# Patient Record
Sex: Female | Born: 1935 | Race: White | Hispanic: No | Smoking: Former smoker
Health system: Southern US, Community
[De-identification: ages and names within clinical notes are randomized; demographics above are authoritative.]

## PROBLEM LIST (undated history)

## (undated) DIAGNOSIS — N83209 Unspecified ovarian cyst, unspecified side: Secondary | ICD-10-CM

## (undated) DIAGNOSIS — R011 Cardiac murmur, unspecified: Secondary | ICD-10-CM

## (undated) DIAGNOSIS — E785 Hyperlipidemia, unspecified: Secondary | ICD-10-CM

## (undated) DIAGNOSIS — H9190 Unspecified hearing loss, unspecified ear: Secondary | ICD-10-CM

## (undated) DIAGNOSIS — M4800 Spinal stenosis, site unspecified: Secondary | ICD-10-CM

## (undated) DIAGNOSIS — F32A Depression, unspecified: Secondary | ICD-10-CM

## (undated) DIAGNOSIS — B029 Zoster without complications: Secondary | ICD-10-CM

## (undated) DIAGNOSIS — I10 Essential (primary) hypertension: Secondary | ICD-10-CM

## (undated) DIAGNOSIS — Z96649 Presence of unspecified artificial hip joint: Secondary | ICD-10-CM

## (undated) DIAGNOSIS — B0229 Other postherpetic nervous system involvement: Secondary | ICD-10-CM

## (undated) DIAGNOSIS — F329 Major depressive disorder, single episode, unspecified: Secondary | ICD-10-CM

## (undated) DIAGNOSIS — T7840XA Allergy, unspecified, initial encounter: Secondary | ICD-10-CM

## (undated) DIAGNOSIS — M353 Polymyalgia rheumatica: Secondary | ICD-10-CM

## (undated) DIAGNOSIS — M1990 Unspecified osteoarthritis, unspecified site: Secondary | ICD-10-CM

## (undated) DIAGNOSIS — C4490 Unspecified malignant neoplasm of skin, unspecified: Secondary | ICD-10-CM

## (undated) DIAGNOSIS — K219 Gastro-esophageal reflux disease without esophagitis: Secondary | ICD-10-CM

## (undated) HISTORY — DX: Unspecified osteoarthritis, unspecified site: M19.90

## (undated) HISTORY — DX: Unspecified malignant neoplasm of skin, unspecified: C44.90

## (undated) HISTORY — DX: Depression, unspecified: F32.A

## (undated) HISTORY — DX: Other postherpetic nervous system involvement: B02.29

## (undated) HISTORY — DX: Essential (primary) hypertension: I10

## (undated) HISTORY — DX: Hyperlipidemia, unspecified: E78.5

## (undated) HISTORY — DX: Polymyalgia rheumatica: M35.3

## (undated) HISTORY — DX: Allergy, unspecified, initial encounter: T78.40XA

## (undated) HISTORY — PX: DILATION AND CURETTAGE OF UTERUS: SHX78

## (undated) HISTORY — DX: Presence of unspecified artificial hip joint: Z96.649

## (undated) HISTORY — PX: HERNIA REPAIR: SHX51

## (undated) HISTORY — DX: Major depressive disorder, single episode, unspecified: F32.9

## (undated) HISTORY — DX: Unspecified ovarian cyst, unspecified side: N83.209

## (undated) HISTORY — DX: Gastro-esophageal reflux disease without esophagitis: K21.9

## (undated) HISTORY — DX: Spinal stenosis, site unspecified: M48.00

## (undated) HISTORY — PX: TIBIA FRACTURE SURGERY: SHX806

## (undated) HISTORY — PX: OOPHORECTOMY: SHX86

## (undated) HISTORY — PX: TONSILLECTOMY AND ADENOIDECTOMY: SUR1326

## (undated) HISTORY — DX: Zoster without complications: B02.9

## (undated) HISTORY — PX: HYSTEROSCOPY: SHX211

---

## 1998-05-04 ENCOUNTER — Ambulatory Visit (HOSPITAL_COMMUNITY): Admission: RE | Admit: 1998-05-04 | Discharge: 1998-05-04 | Payer: Self-pay | Admitting: *Deleted

## 1999-07-26 ENCOUNTER — Other Ambulatory Visit: Admission: RE | Admit: 1999-07-26 | Discharge: 1999-07-26 | Payer: Self-pay | Admitting: Obstetrics and Gynecology

## 1999-08-13 ENCOUNTER — Encounter: Payer: Self-pay | Admitting: Internal Medicine

## 1999-08-13 ENCOUNTER — Encounter: Admission: RE | Admit: 1999-08-13 | Discharge: 1999-08-13 | Payer: Self-pay | Admitting: Internal Medicine

## 2000-04-24 ENCOUNTER — Encounter (INDEPENDENT_AMBULATORY_CARE_PROVIDER_SITE_OTHER): Payer: Self-pay | Admitting: Gastroenterology

## 2000-09-02 ENCOUNTER — Other Ambulatory Visit: Admission: RE | Admit: 2000-09-02 | Discharge: 2000-09-02 | Payer: Self-pay | Admitting: Obstetrics and Gynecology

## 2001-09-20 ENCOUNTER — Other Ambulatory Visit: Admission: RE | Admit: 2001-09-20 | Discharge: 2001-09-20 | Payer: Self-pay | Admitting: Obstetrics and Gynecology

## 2003-01-31 ENCOUNTER — Encounter: Payer: Self-pay | Admitting: Internal Medicine

## 2003-03-03 ENCOUNTER — Other Ambulatory Visit: Admission: RE | Admit: 2003-03-03 | Discharge: 2003-03-03 | Payer: Self-pay | Admitting: Obstetrics and Gynecology

## 2003-03-11 ENCOUNTER — Encounter: Payer: Self-pay | Admitting: Emergency Medicine

## 2003-03-11 ENCOUNTER — Emergency Department (HOSPITAL_COMMUNITY): Admission: EM | Admit: 2003-03-11 | Discharge: 2003-03-11 | Payer: Self-pay | Admitting: Emergency Medicine

## 2003-03-22 ENCOUNTER — Encounter: Payer: Self-pay | Admitting: Internal Medicine

## 2004-06-17 ENCOUNTER — Ambulatory Visit: Payer: Self-pay | Admitting: Family Medicine

## 2004-09-13 ENCOUNTER — Ambulatory Visit: Payer: Self-pay | Admitting: Internal Medicine

## 2005-01-07 ENCOUNTER — Ambulatory Visit: Payer: Self-pay | Admitting: Internal Medicine

## 2005-02-05 ENCOUNTER — Ambulatory Visit: Payer: Self-pay | Admitting: Internal Medicine

## 2005-03-05 ENCOUNTER — Other Ambulatory Visit: Admission: RE | Admit: 2005-03-05 | Discharge: 2005-03-05 | Payer: Self-pay | Admitting: Obstetrics and Gynecology

## 2005-03-14 ENCOUNTER — Ambulatory Visit: Payer: Self-pay | Admitting: Internal Medicine

## 2005-03-18 ENCOUNTER — Ambulatory Visit: Payer: Self-pay | Admitting: Internal Medicine

## 2005-05-09 ENCOUNTER — Ambulatory Visit: Payer: Self-pay | Admitting: Internal Medicine

## 2005-06-06 ENCOUNTER — Ambulatory Visit: Payer: Self-pay | Admitting: Internal Medicine

## 2005-07-16 ENCOUNTER — Ambulatory Visit: Payer: Self-pay | Admitting: Internal Medicine

## 2005-07-29 ENCOUNTER — Ambulatory Visit: Payer: Self-pay | Admitting: Internal Medicine

## 2005-08-29 ENCOUNTER — Ambulatory Visit: Payer: Self-pay | Admitting: Internal Medicine

## 2005-10-13 ENCOUNTER — Ambulatory Visit: Payer: Self-pay | Admitting: Internal Medicine

## 2005-10-28 ENCOUNTER — Ambulatory Visit: Payer: Self-pay | Admitting: Internal Medicine

## 2005-12-10 ENCOUNTER — Ambulatory Visit: Payer: Self-pay | Admitting: Internal Medicine

## 2005-12-19 ENCOUNTER — Ambulatory Visit: Payer: Self-pay | Admitting: Gastroenterology

## 2006-01-05 ENCOUNTER — Ambulatory Visit: Payer: Self-pay | Admitting: Internal Medicine

## 2006-01-13 ENCOUNTER — Encounter: Payer: Self-pay | Admitting: Internal Medicine

## 2006-01-13 ENCOUNTER — Ambulatory Visit: Payer: Self-pay | Admitting: Gastroenterology

## 2006-01-13 LAB — HM COLONOSCOPY

## 2006-01-29 ENCOUNTER — Encounter: Payer: Self-pay | Admitting: Internal Medicine

## 2006-03-17 ENCOUNTER — Ambulatory Visit: Payer: Self-pay | Admitting: Internal Medicine

## 2006-03-17 ENCOUNTER — Other Ambulatory Visit: Admission: RE | Admit: 2006-03-17 | Discharge: 2006-03-17 | Payer: Self-pay | Admitting: Obstetrics and Gynecology

## 2006-05-06 ENCOUNTER — Ambulatory Visit: Payer: Self-pay | Admitting: Internal Medicine

## 2006-06-18 ENCOUNTER — Ambulatory Visit: Payer: Self-pay | Admitting: Internal Medicine

## 2006-08-07 ENCOUNTER — Ambulatory Visit: Payer: Self-pay | Admitting: Internal Medicine

## 2006-08-07 LAB — CONVERTED CEMR LAB
ALT: 19 units/L (ref 0–40)
AST: 22 units/L (ref 0–37)
Albumin: 3.8 g/dL (ref 3.5–5.2)
Alkaline Phosphatase: 74 units/L (ref 39–117)
BUN: 22 mg/dL (ref 6–23)
Bilirubin, Direct: 0.1 mg/dL (ref 0.0–0.3)
CO2: 32 meq/L (ref 19–32)
Calcium: 9.3 mg/dL (ref 8.4–10.5)
Chloride: 102 meq/L (ref 96–112)
Cholesterol: 160 mg/dL (ref 0–200)
Creatinine, Ser: 0.8 mg/dL (ref 0.4–1.2)
GFR calc Af Amer: 89 mL/min
GFR calc non Af Amer: 73 mL/min
Glucose, Bld: 91 mg/dL (ref 70–99)
HDL: 45 mg/dL (ref >39.0)
LDL Cholesterol: 91 mg/dL (ref 0–99)
Potassium: 4.8 meq/L (ref 3.5–5.1)
Sodium: 138 meq/L (ref 135–145)
Total Bilirubin: 0.8 mg/dL (ref 0.3–1.2)
Total CHOL/HDL Ratio: 3.6
Total Protein: 6.5 g/dL (ref 6.0–8.3)
Triglycerides: 118 mg/dL (ref 0–149)
VLDL: 24 mg/dL (ref 0–40)

## 2006-09-29 ENCOUNTER — Ambulatory Visit: Payer: Self-pay | Admitting: Internal Medicine

## 2006-10-01 ENCOUNTER — Ambulatory Visit: Payer: Self-pay | Admitting: Internal Medicine

## 2006-11-16 ENCOUNTER — Ambulatory Visit: Payer: Self-pay | Admitting: Internal Medicine

## 2006-11-16 LAB — CONVERTED CEMR LAB: CRP, High Sensitivity: 1 (ref 0.00–5.00)

## 2006-12-04 ENCOUNTER — Ambulatory Visit: Payer: Self-pay | Admitting: Internal Medicine

## 2007-01-19 DIAGNOSIS — J309 Allergic rhinitis, unspecified: Secondary | ICD-10-CM | POA: Insufficient documentation

## 2007-01-19 DIAGNOSIS — K222 Esophageal obstruction: Secondary | ICD-10-CM | POA: Insufficient documentation

## 2007-03-02 ENCOUNTER — Encounter: Payer: Self-pay | Admitting: Internal Medicine

## 2007-03-03 ENCOUNTER — Ambulatory Visit: Payer: Self-pay | Admitting: Internal Medicine

## 2007-03-04 LAB — CONVERTED CEMR LAB
ALT: 27 units/L (ref 0–35)
AST: 24 units/L (ref 0–37)
Albumin: 3.8 g/dL (ref 3.5–5.2)
Alkaline Phosphatase: 84 units/L (ref 39–117)
BUN: 21 mg/dL (ref 6–23)
Bilirubin, Direct: 0.1 mg/dL (ref 0.0–0.3)
CO2: 34 meq/L — ABNORMAL HIGH (ref 19–32)
Calcium: 9.7 mg/dL (ref 8.4–10.5)
Chloride: 99 meq/L (ref 96–112)
Cholesterol: 164 mg/dL (ref 0–200)
Creatinine, Ser: 0.7 mg/dL (ref 0.4–1.2)
GFR calc Af Amer: 103 mL/min
GFR calc non Af Amer: 85 mL/min
Glucose, Bld: 103 mg/dL — ABNORMAL HIGH (ref 70–99)
HDL: 52.1 mg/dL (ref >39.0)
LDL Cholesterol: 94 mg/dL (ref 0–99)
Potassium: 3.7 meq/L (ref 3.5–5.1)
Sodium: 140 meq/L (ref 135–145)
Total Bilirubin: 0.7 mg/dL (ref 0.3–1.2)
Total CHOL/HDL Ratio: 3.1
Total Protein: 6.9 g/dL (ref 6.0–8.3)
Triglycerides: 92 mg/dL (ref 0–149)
VLDL: 18 mg/dL (ref 0–40)

## 2007-06-22 ENCOUNTER — Telehealth: Payer: Self-pay | Admitting: Internal Medicine

## 2007-06-25 ENCOUNTER — Ambulatory Visit: Payer: Self-pay | Admitting: Internal Medicine

## 2007-07-16 ENCOUNTER — Telehealth: Payer: Self-pay | Admitting: Internal Medicine

## 2007-07-19 ENCOUNTER — Telehealth: Payer: Self-pay | Admitting: Internal Medicine

## 2007-07-26 ENCOUNTER — Telehealth: Payer: Self-pay | Admitting: Internal Medicine

## 2007-09-30 ENCOUNTER — Ambulatory Visit: Payer: Self-pay | Admitting: Internal Medicine

## 2007-10-04 ENCOUNTER — Telehealth: Payer: Self-pay | Admitting: Internal Medicine

## 2007-10-04 LAB — CONVERTED CEMR LAB
ALT: 30 units/L (ref 0–35)
AST: 34 units/L (ref 0–37)
Alkaline Phosphatase: 98 units/L (ref 39–117)
BUN: 22 mg/dL (ref 6–23)
CO2: 32 meq/L (ref 19–32)
Calcium: 9.5 mg/dL (ref 8.4–10.5)
Chloride: 101 meq/L (ref 96–112)
Cholesterol: 148 mg/dL (ref 0–200)
GFR calc Af Amer: 103 mL/min
Total CHOL/HDL Ratio: 3.1
Total Protein: 6.5 g/dL (ref 6.0–8.3)

## 2007-10-12 ENCOUNTER — Telehealth: Payer: Self-pay | Admitting: Internal Medicine

## 2007-10-13 ENCOUNTER — Telehealth: Payer: Self-pay | Admitting: Internal Medicine

## 2007-10-22 ENCOUNTER — Other Ambulatory Visit: Admission: RE | Admit: 2007-10-22 | Discharge: 2007-10-22 | Payer: Self-pay | Admitting: Obstetrics and Gynecology

## 2007-12-30 ENCOUNTER — Ambulatory Visit: Payer: Self-pay | Admitting: Internal Medicine

## 2008-02-16 ENCOUNTER — Telehealth: Payer: Self-pay | Admitting: Internal Medicine

## 2008-02-23 ENCOUNTER — Ambulatory Visit: Payer: Self-pay | Admitting: Internal Medicine

## 2008-02-24 LAB — CONVERTED CEMR LAB
Calcium: 9.3 mg/dL (ref 8.4–10.5)
Chloride: 99 meq/L (ref 96–112)
Creatinine, Ser: 0.8 mg/dL (ref 0.4–1.2)
GFR calc non Af Amer: 73 mL/min
HDL: 46.7 mg/dL (ref >39.0)
LDL Cholesterol: 71 mg/dL (ref 0–99)
Sodium: 139 meq/L (ref 135–145)
Total Bilirubin: 0.6 mg/dL (ref 0.3–1.2)
Total CHOL/HDL Ratio: 3
Triglycerides: 103 mg/dL (ref 0–149)

## 2008-03-03 LAB — CONVERTED CEMR LAB: Vit D, 1,25-Dihydroxy: 33 (ref 30–89)

## 2008-05-10 ENCOUNTER — Ambulatory Visit: Payer: Self-pay | Admitting: Internal Medicine

## 2008-06-23 ENCOUNTER — Ambulatory Visit: Payer: Self-pay | Admitting: Internal Medicine

## 2008-06-26 LAB — CONVERTED CEMR LAB
AST: 20 units/L (ref 0–37)
Basophils Absolute: 0 10*3/uL (ref 0.0–0.1)
Basophils Relative: 0.7 % (ref 0.0–3.0)
Chloride: 101 meq/L (ref 96–112)
Cholesterol: 154 mg/dL (ref 0–200)
Creatinine, Ser: 0.7 mg/dL (ref 0.4–1.2)
Eosinophils Absolute: 0.3 10*3/uL (ref 0.0–0.7)
GFR calc non Af Amer: 85 mL/min
HDL: 44.8 mg/dL (ref >39.0)
MCHC: 34.1 g/dL (ref 30.0–36.0)
MCV: 94.9 fL (ref 78.0–100.0)
Neutrophils Relative %: 62.4 % (ref 43.0–77.0)
Platelets: 215 10*3/uL (ref 150–400)
Potassium: 3.4 meq/L — ABNORMAL LOW (ref 3.5–5.1)
RDW: 11.8 % (ref 11.5–14.6)
Sodium: 141 meq/L (ref 135–145)
TSH: 1.71 microintl units/mL (ref 0.35–5.50)
Total Bilirubin: 0.7 mg/dL (ref 0.3–1.2)
Triglycerides: 98 mg/dL (ref 0–149)
VLDL: 20 mg/dL (ref 0–40)

## 2008-06-28 ENCOUNTER — Telehealth: Payer: Self-pay | Admitting: Internal Medicine

## 2008-06-30 ENCOUNTER — Telehealth: Payer: Self-pay | Admitting: Internal Medicine

## 2008-08-17 ENCOUNTER — Encounter: Payer: Self-pay | Admitting: Internal Medicine

## 2008-08-24 ENCOUNTER — Telehealth: Payer: Self-pay | Admitting: Internal Medicine

## 2008-08-28 ENCOUNTER — Telehealth: Payer: Self-pay | Admitting: Internal Medicine

## 2008-09-28 ENCOUNTER — Ambulatory Visit: Payer: Self-pay | Admitting: Internal Medicine

## 2008-09-29 ENCOUNTER — Telehealth: Payer: Self-pay | Admitting: Internal Medicine

## 2008-10-04 ENCOUNTER — Ambulatory Visit: Payer: Self-pay | Admitting: Internal Medicine

## 2008-10-04 ENCOUNTER — Encounter: Admission: RE | Admit: 2008-10-04 | Discharge: 2008-11-01 | Payer: Self-pay | Admitting: Internal Medicine

## 2008-11-01 ENCOUNTER — Encounter: Payer: Self-pay | Admitting: Internal Medicine

## 2008-11-15 ENCOUNTER — Telehealth (INDEPENDENT_AMBULATORY_CARE_PROVIDER_SITE_OTHER): Payer: Self-pay | Admitting: *Deleted

## 2008-11-15 ENCOUNTER — Ambulatory Visit: Payer: Self-pay | Admitting: Family Medicine

## 2008-11-21 ENCOUNTER — Telehealth: Payer: Self-pay | Admitting: Internal Medicine

## 2008-12-09 ENCOUNTER — Ambulatory Visit: Payer: Self-pay | Admitting: Family Medicine

## 2008-12-10 ENCOUNTER — Encounter: Admission: RE | Admit: 2008-12-10 | Discharge: 2008-12-10 | Payer: Self-pay | Admitting: Orthopedic Surgery

## 2009-01-05 ENCOUNTER — Ambulatory Visit: Payer: Self-pay | Admitting: Internal Medicine

## 2009-01-09 LAB — CONVERTED CEMR LAB
ALT: 20 units/L (ref 0–35)
Albumin: 4.1 g/dL (ref 3.5–5.2)
Cholesterol: 188 mg/dL (ref 0–200)
GFR calc non Af Amer: 63.42 mL/min (ref >60)
Glucose, Bld: 101 mg/dL — ABNORMAL HIGH (ref 70–99)
HDL: 60.2 mg/dL (ref >39.00)
Potassium: 3.9 meq/L (ref 3.5–5.1)
Sodium: 136 meq/L (ref 135–145)
TSH: 1.5 microintl units/mL (ref 0.35–5.50)
Total Bilirubin: 1.4 mg/dL — ABNORMAL HIGH (ref 0.3–1.2)
Total Protein: 7.5 g/dL (ref 6.0–8.3)
VLDL: 23.2 mg/dL (ref 0.0–40.0)

## 2009-01-10 ENCOUNTER — Ambulatory Visit: Payer: Self-pay | Admitting: Internal Medicine

## 2009-01-10 ENCOUNTER — Encounter: Payer: Self-pay | Admitting: Internal Medicine

## 2009-01-31 ENCOUNTER — Ambulatory Visit: Payer: Self-pay | Admitting: Obstetrics and Gynecology

## 2009-02-07 ENCOUNTER — Telehealth: Payer: Self-pay | Admitting: Internal Medicine

## 2009-02-12 ENCOUNTER — Ambulatory Visit: Payer: Self-pay | Admitting: Internal Medicine

## 2009-02-12 DIAGNOSIS — K5730 Diverticulosis of large intestine without perforation or abscess without bleeding: Secondary | ICD-10-CM | POA: Insufficient documentation

## 2009-02-12 LAB — CONVERTED CEMR LAB
ALT: 21 units/L (ref 0–35)
Albumin: 3.4 g/dL — ABNORMAL LOW (ref 3.5–5.2)
Alkaline Phosphatase: 92 units/L (ref 39–117)
CO2: 32 meq/L (ref 19–32)
Eosinophils Relative: 6 % — ABNORMAL HIGH (ref 0.0–5.0)
GFR calc non Af Amer: 84.74 mL/min (ref >60)
Glucose, Bld: 106 mg/dL — ABNORMAL HIGH (ref 70–99)
Lymphocytes Relative: 16.8 % (ref 12.0–46.0)
Monocytes Relative: 9.9 % (ref 3.0–12.0)
Neutrophils Relative %: 67.2 % (ref 43.0–77.0)
Platelets: 354 10*3/uL (ref 150.0–400.0)
Potassium: 3.9 meq/L (ref 3.5–5.1)
Sodium: 135 meq/L (ref 135–145)
Total Protein: 7.6 g/dL (ref 6.0–8.3)
WBC: 9.6 10*3/uL (ref 4.5–10.5)

## 2009-02-13 ENCOUNTER — Telehealth: Payer: Self-pay | Admitting: Internal Medicine

## 2009-02-14 ENCOUNTER — Ambulatory Visit (HOSPITAL_COMMUNITY): Admission: RE | Admit: 2009-02-14 | Discharge: 2009-02-14 | Payer: Self-pay | Admitting: Internal Medicine

## 2009-02-16 ENCOUNTER — Ambulatory Visit: Payer: Self-pay | Admitting: Internal Medicine

## 2009-02-22 ENCOUNTER — Ambulatory Visit: Payer: Self-pay | Admitting: Internal Medicine

## 2009-02-22 ENCOUNTER — Encounter: Payer: Self-pay | Admitting: Internal Medicine

## 2009-02-23 ENCOUNTER — Encounter: Payer: Self-pay | Admitting: Internal Medicine

## 2009-02-28 ENCOUNTER — Telehealth: Payer: Self-pay | Admitting: Internal Medicine

## 2009-07-11 ENCOUNTER — Ambulatory Visit: Payer: Self-pay | Admitting: Internal Medicine

## 2009-07-12 LAB — CONVERTED CEMR LAB
ALT: 17 units/L (ref 0–35)
AST: 20 units/L (ref 0–37)
Alkaline Phosphatase: 84 units/L (ref 39–117)
Calcium: 9.3 mg/dL (ref 8.4–10.5)
Creatinine, Ser: 0.8 mg/dL (ref 0.4–1.2)
GFR calc non Af Amer: 72.56 mL/min (ref >60)
Glucose, Bld: 87 mg/dL (ref 70–99)
HDL: 51.7 mg/dL (ref >39.00)
Sodium: 138 meq/L (ref 135–145)
Total Bilirubin: 0.9 mg/dL (ref 0.3–1.2)

## 2009-08-20 ENCOUNTER — Encounter: Payer: Self-pay | Admitting: Internal Medicine

## 2009-11-08 ENCOUNTER — Telehealth: Payer: Self-pay | Admitting: Internal Medicine

## 2009-11-12 ENCOUNTER — Telehealth: Payer: Self-pay | Admitting: Internal Medicine

## 2009-11-14 ENCOUNTER — Ambulatory Visit: Payer: Self-pay | Admitting: Internal Medicine

## 2010-01-11 ENCOUNTER — Telehealth: Payer: Self-pay | Admitting: Internal Medicine

## 2010-02-13 ENCOUNTER — Ambulatory Visit: Payer: Self-pay | Admitting: Obstetrics and Gynecology

## 2010-02-13 ENCOUNTER — Telehealth: Payer: Self-pay | Admitting: Internal Medicine

## 2010-02-13 ENCOUNTER — Other Ambulatory Visit: Admission: RE | Admit: 2010-02-13 | Discharge: 2010-02-13 | Payer: Self-pay | Admitting: Obstetrics and Gynecology

## 2010-03-13 ENCOUNTER — Ambulatory Visit: Payer: Self-pay | Admitting: Internal Medicine

## 2010-03-14 LAB — CONVERTED CEMR LAB
Albumin: 3.8 g/dL (ref 3.5–5.2)
Alkaline Phosphatase: 102 units/L (ref 39–117)
BUN: 21 mg/dL (ref 6–23)
CO2: 33 meq/L — ABNORMAL HIGH (ref 19–32)
Calcium: 9.1 mg/dL (ref 8.4–10.5)
Cholesterol: 145 mg/dL (ref 0–200)
Creatinine, Ser: 0.7 mg/dL (ref 0.4–1.2)
GFR calc non Af Amer: 92.06 mL/min (ref >60)
Glucose, Bld: 73 mg/dL (ref 70–99)
HDL: 53.2 mg/dL (ref >39.00)
Sodium: 139 meq/L (ref 135–145)
Total Protein: 6.7 g/dL (ref 6.0–8.3)
Triglycerides: 85 mg/dL (ref 0.0–149.0)
VLDL: 17 mg/dL (ref 0.0–40.0)

## 2010-08-22 NOTE — Progress Notes (Signed)
Summary: REQ FOR SAMPLES [facility])  Phone Note Call from Patient   Caller: Patient  [phone] Reason for Call: Talk to Nurse Summary of Call: Pt called to adv that she would like samples of med:  Benicar ... Pt adv that Rx has been sent in but she is currently out of medication and may not receive the medication for at least a week.Marland KitchenMarland KitchenTherefore, pt is req samples of medication to do her till her meds arrive.... Pt adv that she can be reached at [phone] when / if  samples are ready for p/u.  Pt can be reached at [phone] when same is ready for p/u.  Initial call taken by: Debbra Riding,  November 08, 2009 11:55 AM  Follow-up for Phone Call        samples ready.Patient notified.  Follow-up by: Gladis Riffle, RN,  November 08, 2009 12:15 PM

## 2010-08-22 NOTE — Assessment & Plan Note (Signed)
Summary: 4 month fup//ccm   Vital Signs:  Patient profile:   77 year old female Pulse rate:   60 / minute Pulse rhythm:   regular Resp:     12 per minute BP sitting:   122 / 70  (left arm) Cuff size:   regular  Vitals Entered By: Gladis Riffle, RN (March 13, 2010 10:54 AM) CC: 4 month rov Is Patient Diabetic? No   Primary Care Provider:  [name], MD  CC:  4 month rov.  History of Present Illness:  Follow-Up Visit      This is an [name] who presents for Follow-up visit.  The patient denies chest pain and palpitations.  Since the last visit the patient notes no new problems or concerns.  The patient reports taking meds as prescribed.  When questioned about possible medication side effects, the patient notes none.    All other systems reviewed and were negative   Preventive Screening-Counseling & Management  Alcohol-Tobacco     Smoking Status: quit  Current Problems (verified): 1)  Diverticulosis-colon  (ICD-562.10) 2)  Gerd  (ICD-530.81) 3)  Spinal Stenosis, Lumbar  (ICD-724.02) 4)  Hyperlipidemia  (ICD-272.4) 5)  Esophageal Stricture  (ICD-530.3) 6)  Osteoporosis  (ICD-733.00) 7)  Hypertension  (ICD-401.9) 8)  Depression  (ICD-311) 9)  Allergic Rhinitis  (ICD-477.9)  Current Medications (verified): 1)  Aspir-81 81 Mg Tbec (Aspirin) .... Take 1 Tablet By Mouth Once A Day 2)  Benicar Hct 20-12.5 Mg Tabs (Olmesartan Medoxomil-Hctz) .Marland Kitchen.. 1 Daily By Mouth 3)  Cartia Xt 180 Mg Cp24 (Diltiazem Hcl Coated Beads) .... Take 1 Tablet By Mouth Once A Day 4)  Alendronate Sodium 70 Mg  Tabs (Alendronate Sodium) .... Weekly 5)  Flax Seed Oil 1000 Mg Caps (Flaxseed (Linseed)) .Marland Kitchen.. 1 Capsule By Mouth Once Daily 6)  Simvastatin 20 Mg Tabs (Simvastatin) .... Take 1 Tablet By Mouth At Bedtime 7)  Caltrate 600+d 600-400 Mg-Unit  Tabs (Calcium Carbonate-Vitamin D) .Marland Kitchen.. 1 Tablet By Mouth Two Times A Day 8)  Vitamin D 1000 Unit  Caps (Cholecalciferol) .... Once Daily 9)   Tylenol Extra Strength 500 Mg Tabs (Acetaminophen) .... 2 At Bedtime 10)  Omeprazole 20 Mg Cpdr (Omeprazole) .... Take 1 Tablet By Mouth Once A Day 11)  Multivitamins  Tabs (Multiple Vitamin) .Marland Kitchen.. 1 Tablet By Mouth Once Daily  Allergies: 1)  ! Sulfa 2)  ! Codeine 3)  ! * Mercury  Past History:  Past Medical History: Last updated: 02/12/2009 Allergic rhinitis Depression GERD Hypertension Osteoporosis Arthritis Hyperlipidemia Ovarian Cysts Spinal Stenosis DIVERTICULOSIS  Past Surgical History: Last updated: 02/12/2009 Oophorectomy-bilat Hernia repair Colonoscopy-01/13/2006  Family History: Last updated: 11/14/2009 Family History of Esophogeal CA father Family History of Bartholin Gland Carcinoma dtr---deceased---obese---likely cardiac (DM) Family History of Stomach Cancer: Aunt No FH of Colon Cancer: sister deceased stroke 28 yo  Social History: Last updated: 02/12/2009 Former Smoker Alcohol use-yes Retired Married Illicit Drug Use - no Daily Caffeine Use  Risk Factors: Smoking Status: quit (03/13/2010)  Physical Exam  General:  alert and well-developed.   Head:  normocephalic and atraumatic.   Eyes:  pupils equal and pupils round.   Skin:  turgor normal and color normal.   Psych:  normally interactive and good eye contact.     Impression & Recommendations:  Problem # 1:  GERD (ICD-530.81)  no sxs continue current medications  Her updated medication list for this problem includes:    Omeprazole 20 Mg Cpdr (Omeprazole) .Marland KitchenMarland KitchenMarland KitchenMarland Kitchen  Take 1 tablet by mouth once a day  EGD: Location: [facility]   (02/22/2009)  Labs Reviewed: Hgb: 12.8 (02/12/2009)   Hct: 37.7 (02/12/2009)  Problem # 2:  HYPERLIPIDEMIA (ICD-272.4)  controlled continue current medications  Her updated medication list for this problem includes:    Simvastatin 20 Mg Tabs (Simvastatin) .Marland Kitchen... Take 1 tablet by mouth at bedtime  Labs Reviewed: SGOT: 20 (07/11/2009)   SGPT:  17 (07/11/2009)   HDL:51.70 (07/11/2009), 60.20 (01/05/2009)  LDL:74 (07/11/2009), 105 (16/04/9603)  Chol:143 (07/11/2009), 188 (01/05/2009)  Trig:88.0 (07/11/2009), 116.0 (01/05/2009)  Orders: Specimen Handling (54098) TLB-Hepatic/Liver Function Pnl (80076-HEPATIC) TLB-Lipid Panel (80061-LIPID)  Problem # 3:  HYPERTENSION (ICD-401.9)  controlled continue current medications  Her updated medication list for this problem includes:    Benicar Hct 20-12.5 Mg Tabs (Olmesartan medoxomil-hctz) .Marland Kitchen... 1 daily by mouth    Cartia Xt 180 Mg Cp24 (Diltiazem hcl coated beads) .Marland Kitchen... Take 1 tablet by mouth once a day  BP today: 122/70 Prior BP: 138/78 (11/14/2009)  Labs Reviewed: K+: 4.3 (07/11/2009) Creat: : 0.8 (07/11/2009)   Chol: 143 (07/11/2009)   HDL: 51.70 (07/11/2009)   LDL: 74 (07/11/2009)   TG: 88.0 (07/11/2009)  Orders: Venipuncture (11914) TLB-BMP (Basic Metabolic Panel-BMET) (80048-METABOL)  Problem # 4:  DEPRESSION (ICD-311)  she is doing well on meds  Complete Medication List: 1)  Aspir-81 81 Mg Tbec (Aspirin) .... Take 1 tablet by mouth once a day 2)  Benicar Hct 20-12.5 Mg Tabs (Olmesartan medoxomil-hctz) .Marland Kitchen.. 1 daily by mouth 3)  Cartia Xt 180 Mg Cp24 (Diltiazem hcl coated beads) .... Take 1 tablet by mouth once a day 4)  Alendronate Sodium 70 Mg Tabs (Alendronate sodium) .... Weekly 5)  Flax Seed Oil 1000 Mg Caps (Flaxseed (linseed)) .Marland Kitchen.. 1 capsule by mouth once daily 6)  Simvastatin 20 Mg Tabs (Simvastatin) .... Take 1 tablet by mouth at bedtime 7)  Caltrate 600+d 600-400 Mg-unit Tabs (Calcium carbonate-vitamin d) .Marland Kitchen.. 1 tablet by mouth two times a day 8)  Vitamin D 1000 Unit Caps (Cholecalciferol) .... Once daily 9)  Tylenol Extra Strength 500 Mg Tabs (Acetaminophen) .... 2 at bedtime 10)  Omeprazole 20 Mg Cpdr (Omeprazole) .... Take 1 tablet by mouth once a day 11)  Multivitamins Tabs (Multiple vitamin) .Marland Kitchen.. 1 tablet by mouth once daily Prescriptions: OMEPRAZOLE 20  MG CPDR (OMEPRAZOLE) Take 1 tablet by mouth once a day  #90 x 3   Entered by:   Gladis Riffle, RN   Authorized by:   [name] MD   Signed by:   Gladis Riffle, RN on 03/13/2010   Method used:   Electronically to        [facility] [phone]* (retail)       [address]       [place], [place]  [address]       Ph: [phone] or [phone]       Fax: [phone]   RxID:   [id number]

## 2010-08-22 NOTE — Progress Notes (Signed)
Summary: Pt req script for Simvastatin 20mg  to [facility] Note Call from Patient Call back at [facility] Phone [phone]   Caller: Patient Summary of Call: Pt is req Simvastatin 20mg  once daily, as per previous discussion with Dr. Cato Mulligan. Pt was on 40mg  and was having to cut pills in half. [facility] to [phone]  Initial call taken by: Lucy Antigua,  January 11, 2010 2:37 PM  Follow-up for Phone Call        See Rx. Patient notified.  Follow-up by: Gladis Riffle, RN,  January 11, 2010 4:03 PM    New/Updated Medications: SIMVASTATIN 20 MG TABS (SIMVASTATIN) Take 1 tablet by mouth at bedtime Prescriptions: SIMVASTATIN 20 MG TABS (SIMVASTATIN) Take 1 tablet by mouth at bedtime  #30 x 5   Entered by:   Gladis Riffle, RN   Authorized by:   [name] MD   Signed by:   Gladis Riffle, RN on 01/11/2010   Method used:   Electronically to        [facility] [phone]* (retail)       [address]       [place], [place]  [address]       Ph: [phone] or [phone]       Fax: [phone]   RxID:   [id number]

## 2010-08-22 NOTE — Assessment & Plan Note (Signed)
Summary: FU BP/et   Vital Signs:  Patient profile:   77 year old female Weight:      152 pounds Temp:     97.8 degrees F oral Pulse rate:   64 / minute Pulse rhythm:   regular Resp:     12 per minute BP sitting:   138 / 78  (left arm) Cuff size:   regular  Vitals Entered By: Gladis Riffle, RN (November 14, 2009 10:52 AM)  Serial Vital Signs/Assessments:  Time      Position  BP       Pulse  Resp  Temp     By                     132/74                         [name] MD  CC: FU BP--taking benicar 40-25 1/2 once a day, wants to discuss Is Patient Diabetic? No   Primary Care Provider:  [name], MD  CC:  FU BP--taking benicar 40-25 1/2 once a day and wants to discuss.  History of Present Illness:  Follow-Up Visit      This is an 77 year old woman who presents for Follow-up visit.  The patient denies chest pain and palpitations.  Since the last visit the patient notes no new problems or concerns.  The patient reports taking meds as prescribed.  When questioned about possible medication side effects, the patient notes none.    All other systems reviewed and were negative   Preventive Screening-Counseling & Management  Alcohol-Tobacco     Smoking Status: quit  Current Problems (verified): 1)  Diverticulosis-colon  (ICD-562.10) 2)  Gerd  (ICD-530.81) 3)  Spinal Stenosis, Lumbar  (ICD-724.02) 4)  Hyperlipidemia  (ICD-272.4) 5)  Esophageal Stricture  (ICD-530.3) 6)  Osteoporosis  (ICD-733.00) 7)  Hypertension  (ICD-401.9) 8)  Depression  (ICD-311) 9)  Allergic Rhinitis  (ICD-477.9)  Current Medications (verified): 1)  Aspir-81 81 Mg Tbec (Aspirin) .... Take 1 Tablet By Mouth Once A Day 2)  Benicar Hct 40-25 Mg Tabs (Olmesartan Medoxomil-Hctz) .... Take 1/2  Tablet By Mouth Once A Day 3)  Cartia Xt 180 Mg Cp24 (Diltiazem Hcl Coated Beads) .... Take 1 Tablet By Mouth Once A Day 4)  Alendronate Sodium 70 Mg  Tabs (Alendronate Sodium) .... Weekly 5)  Flax Seed Oil 1000  Mg Caps (Flaxseed (Linseed)) .Marland Kitchen.. 1 Capsule By Mouth Once Daily 6)  Simvastatin 40 Mg Tabs (Simvastatin) .... Take 1/2 Tablet By Mouth At Bedtime 7)  Caltrate 600+d 600-400 Mg-Unit  Tabs (Calcium Carbonate-Vitamin D) .Marland Kitchen.. 1 Tablet By Mouth Two Times A Day 8)  Vitamin D 1000 Unit  Caps (Cholecalciferol) .... Once Daily 9)  Tylenol Extra Strength 500 Mg Tabs (Acetaminophen) .... 2 At Bedtime 10)  Prilosec 20 Mg Cpdr (Omeprazole) .... Take 1 Capsule By Mouth Once Daily 11)  Multivitamins  Tabs (Multiple Vitamin) .Marland Kitchen.. 1 Tablet By Mouth Once Daily  Allergies: 1)  ! Sulfa 2)  ! Codeine 3)  ! * Mercury  Past History:  Past Medical History: Last updated: 02/12/2009 Allergic rhinitis Depression GERD Hypertension Osteoporosis Arthritis Hyperlipidemia Ovarian Cysts Spinal Stenosis DIVERTICULOSIS  Past Surgical History: Last updated: 02/12/2009 Oophorectomy-bilat Hernia repair Colonoscopy-01/13/2006  Family History: Last updated: 11/14/2009 Family History of Esophogeal CA father Family History of Bartholin Gland Carcinoma dtr---deceased---obese---likely cardiac (DM) Family History of Stomach Cancer: Aunt No FH of  Colon Cancer: sister deceased stroke 59 yo  Social History: Last updated: 02/12/2009 Former Smoker Alcohol use-yes Retired Married Illicit Drug Use - no Daily Caffeine Use  Risk Factors: Smoking Status: quit (11/14/2009)  Family History: Family History of Esophogeal CA father Family History of Bartholin Gland Carcinoma dtr---deceased---obese---likely cardiac (DM) Family History of Stomach Cancer: Aunt No FH of Colon Cancer: sister deceased stroke 35 yo  Review of Systems       All other systems reviewed and were negative   Physical Exam  General:  alert and well-developed.   Head:  normocephalic and atraumatic.   Eyes:  pupils equal and pupils round.   Ears:  R ear normal and L ear normal.   Neck:  No deformities, masses, or tenderness  noted. Chest Wall:  No deformities, masses, or tenderness noted. Lungs:  normal respiratory effort and no intercostal retractions.   Heart:  normal rate and regular rhythm.   Abdomen:  soft and non-tender.   Msk:  No deformity or scoliosis noted of thoracic or lumbar spine.   Extremities:  No clubbing, cyanosis, edema, or deformity noted  Neurologic:  cranial nerves II-XII intact and gait normal.   Skin:  turgor normal and color normal.   Cervical Nodes:  no anterior cervical adenopathy and no posterior cervical adenopathy.   Psych:  normally interactive and good eye contact.     Impression & Recommendations:  Problem # 1:  HYPERLIPIDEMIA (ICD-272.4) controlled continue current medications  Her updated medication list for this problem includes:    Simvastatin 40 Mg Tabs (Simvastatin) .Marland Kitchen... Take 1/2 tablet by mouth at bedtime  Labs Reviewed: SGOT: 20 (07/11/2009)   SGPT: 17 (07/11/2009)   HDL:51.70 (07/11/2009), 60.20 (01/05/2009)  LDL:74 (07/11/2009), 105 (16/04/9603)  Chol:143 (07/11/2009), 188 (01/05/2009)  Trig:88.0 (07/11/2009), 116.0 (01/05/2009)  Problem # 2:  GERD (ICD-530.81) controlled, no sxs continue current medications  Her updated medication list for this problem includes:    Prilosec 20 Mg Cpdr (Omeprazole) .Marland Kitchen... Take 1 capsule by mouth once daily  Problem # 3:  HYPERTENSION (ICD-401.9) reasonable control continue current medications  Her updated medication list for this problem includes:    Benicar Hct 20-12.5 Mg Tabs (Olmesartan medoxomil-hctz) .Marland Kitchen... 1 daily by mouth    Cartia Xt 180 Mg Cp24 (Diltiazem hcl coated beads) .Marland Kitchen... Take 1 tablet by mouth once a day  BP today: 138/78 Prior BP: 114/76 (07/11/2009)  Labs Reviewed: K+: 4.3 (07/11/2009) Creat: : 0.8 (07/11/2009)   Chol: 143 (07/11/2009)   HDL: 51.70 (07/11/2009)   LDL: 74 (07/11/2009)   TG: 88.0 (07/11/2009)  Problem # 4:  OSTEOPOROSIS (ICD-733.00) no side effects on meds continue current  medications  Her updated medication list for this problem includes:    Alendronate Sodium 70 Mg Tabs (Alendronate sodium) .Marland Kitchen... Weekly  Complete Medication List: 1)  Aspir-81 81 Mg Tbec (Aspirin) .... Take 1 tablet by mouth once a day 2)  Benicar Hct 20-12.5 Mg Tabs (Olmesartan medoxomil-hctz) .Marland Kitchen.. 1 daily by mouth 3)  Cartia Xt 180 Mg Cp24 (Diltiazem hcl coated beads) .... Take 1 tablet by mouth once a day 4)  Alendronate Sodium 70 Mg Tabs (Alendronate sodium) .... Weekly 5)  Flax Seed Oil 1000 Mg Caps (Flaxseed (linseed)) .Marland Kitchen.. 1 capsule by mouth once daily 6)  Simvastatin 40 Mg Tabs (Simvastatin) .... Take 1/2 tablet by mouth at bedtime 7)  Caltrate 600+d 600-400 Mg-unit Tabs (Calcium carbonate-vitamin d) .Marland Kitchen.. 1 tablet by mouth two times a day 8)  Vitamin D  1000 Unit Caps (Cholecalciferol) .... Once daily 9)  Tylenol Extra Strength 500 Mg Tabs (Acetaminophen) .... 2 at bedtime 10)  Prilosec 20 Mg Cpdr (Omeprazole) .... Take 1 capsule by mouth once daily 11)  Multivitamins Tabs (Multiple vitamin) .Marland Kitchen.. 1 tablet by mouth once daily  Patient Instructions: 1)  Please schedule a follow-up appointment in 4 months. Prescriptions: BENICAR HCT 20-12.5 MG TABS (OLMESARTAN MEDOXOMIL-HCTZ) 1 daily by mouth  #90 x 3   Entered and Authorized by:   [name] MD   Signed by:   [name] MD on 11/14/2009   Method used:   Faxed to ...       [facility]             , [place]         Ph:        Fax: [phone]   RxID:   [id number]

## 2010-08-22 NOTE — Progress Notes (Signed)
Summary: REQ FOR REFILL RX [facility])  Phone Note Call from Patient   Caller: Patient  [phone] Reason for Call: Refill Medication Summary of Call: Pt called to req that a Rx for med: Benicar 20mg  be sent into [facility]...Marland KitchenMarland KitchenPt adv that [facility] received 2 faxes for med refill, one was for Benicar 20mg  / the other was for Benicar 40mg ..... Pt adv that [facility] won't fill Rx till they know which one they are supposed to fill... Pt req that a Rx for the correct dosage be sent to [facility].  Pt can be reached at [phone] with any questions or concerns.  Initial call taken by: Debbra Riding,  November 12, 2009 11:16 AM  Follow-up for Phone Call        Rx done.Patient notified personal voice mail. Follow-up by: Gladis Riffle, RN,  November 12, 2009 1:20 PM    Prescriptions: BENICAR HCT 40-25 MG TABS (OLMESARTAN MEDOXOMIL-HCTZ) Take 1 tablet by mouth once a day  #90 x 3   Entered by:   Gladis Riffle, RN   Authorized by:   [name] MD   Signed by:   Gladis Riffle, RN on 11/12/2009   Method used:   Faxed to ...       [facility]             , [place]         Ph:        Fax: [phone]   RxID:   [id number]

## 2010-08-22 NOTE — Progress Notes (Signed)
Summary: ? re meds   Phone Note Call from Patient Call back at Home Phone [phone]   Caller: Patient Call For: Juanda Chance Reason for Call: Talk to Nurse Summary of Call: Patient has questions regarding Omeprazole medication  Initial call taken by: Tawni Levy,  February 13, 2010 2:52 PM  Follow-up for Phone Call        Dr Juanda Chance- Patient states that she saw Dr Oletha Blend this morning and he suggested that she call and speak to [place] to okay discontinuing omeprazole as she feels "much better" and has "no problems." Per 02/22/10 endoscopy report, patient had mild gastritis and an irregular z-line. Biopsies of antrum showed mild chronic gastritis. Biopsies of EG Junction showed inflammation. I have advised patient that she would most likely need to continue PPI's indefinately but told her that I would ask you as she would really like your advice.   Follow-up by: Lamona Curl CMA Duncan Dull),  February 13, 2010 3:28 PM  Additional Follow-up for Phone Call Additional follow up Details #1::        chart reviewed, specifically EGD 02/2009. Based on the findings, and  on the resolution of pt's symptoms, it is OK to stop Prilosec ( Omeprazole) on trial basis for 1 month, then reasses for daily need vs as needed dosage. Additional Follow-up by: Hart Carwin MD,  February 15, 2010 1:20 PM    Additional Follow-up for Phone Call Additional follow up Details #2::    I have left a message with female for the patient to call back. Dottie Nelson-Smith CMA Duncan Dull)  February 15, 2010 1:34 PM    Dr Regino Schultze recommendations have been given to the patient and she verbalizes understanding. Dottie Nelson-Smith CMA Duncan Dull)  February 15, 2010 2:08 PM

## 2010-09-11 ENCOUNTER — Ambulatory Visit: Payer: Self-pay | Admitting: Obstetrics and Gynecology

## 2010-11-01 ENCOUNTER — Telehealth: Payer: Self-pay | Admitting: Internal Medicine

## 2010-11-01 NOTE — Telephone Encounter (Signed)
Pt is wondering when needs to know when she needs to come in for next ov and if she needs labs done?

## 2010-11-04 NOTE — Telephone Encounter (Signed)
Pt called to find out when she needs to come in for ov with labs prior to ov? Pt is needing lab done to get synthroid refilled. Pls advise.

## 2010-11-04 NOTE — Telephone Encounter (Addendum)
Schedule fasting labs and 6 mth f/u appt one week after.

## 2010-11-04 NOTE — Telephone Encounter (Signed)
I called pt and sch her for fasting labs on 12/06/10 and fup ov for 12/13/10, as noted.

## 2010-12-02 ENCOUNTER — Encounter: Payer: Self-pay | Admitting: Internal Medicine

## 2010-12-03 NOTE — Assessment & Plan Note (Signed)
[facility]                                 ON-CALL NOTE   NAME:[name], [name]                           MRN:          [id number]  DATE:12/03/2006                            DOB:          [birth year]    TIME OF INTERACTION:  At 5:42 p.m.   PHONE NUMBER:  [phone]   OBJECTIVE:  The patient was having trouble breathing. Has a terrible  cough and cold. Took some Mucinex DM and this is actually the fourth  time today she has taken it. All of a sudden after talking on the phone  for a long time trying to resolve an [employer] bill, got all closed up at  once. Had no wheezing. Had been talking a long time and had difficulty  breathing for a few minutes. That has now basically resolved as she is  talking with me on the phone with no problems and no wheezing. She was  recently in [place] for a graduation. The place where she was was air  conditioned and was very cold. She lost her voice and is now congested.   ASSESSMENT:  Presumed spasm of either the laryngeal, pharyngeal or  bronchial muscle wall which is presumably resolving.   PLAN:  May take Zyrtec. She was interested in doing that. Suggest 10 mg  once a day. She may use plain guaifenesin. I would stop the DM and use  it as an acute product. Drink as many fluids as possible. Voice rest as  much as possible and call for an appointment if her symptoms do not  improve. Go to her ER if her symptoms worsen.   PRIMARY CARE PHYSICIAN:  Valetta Mole. Swords, MD, home office is Caffie Pinto, MD  Electronically Signed    RNS/MedQ  DD: 12/03/2006  DT: 12/03/2006  Job #: [id number]   cc:   Valetta Mole. Swords, MD

## 2010-12-06 ENCOUNTER — Other Ambulatory Visit (INDEPENDENT_AMBULATORY_CARE_PROVIDER_SITE_OTHER): Payer: Medicare Other | Admitting: Internal Medicine

## 2010-12-06 DIAGNOSIS — Z0000 Encounter for general adult medical examination without abnormal findings: Secondary | ICD-10-CM

## 2010-12-06 DIAGNOSIS — E785 Hyperlipidemia, unspecified: Secondary | ICD-10-CM

## 2010-12-06 DIAGNOSIS — I10 Essential (primary) hypertension: Secondary | ICD-10-CM

## 2010-12-06 LAB — CBC WITH DIFFERENTIAL/PLATELET
Basophils Relative: 0.7 % (ref 0.0–3.0)
Eosinophils Absolute: 0.4 10*3/uL (ref 0.0–0.7)
Eosinophils Relative: 5.9 % — ABNORMAL HIGH (ref 0.0–5.0)
HCT: 35.9 % — ABNORMAL LOW (ref 36.0–46.0)
Lymphs Abs: 1.8 10*3/uL (ref 0.7–4.0)
MCHC: 33.7 g/dL (ref 30.0–36.0)
MCV: 93.4 fl (ref 78.0–100.0)
Monocytes Absolute: 0.6 10*3/uL (ref 0.1–1.0)
Neutrophils Relative %: 59.9 % (ref 43.0–77.0)
Platelets: 225 10*3/uL (ref 150.0–400.0)
WBC: 7.2 10*3/uL (ref 4.5–10.5)

## 2010-12-06 LAB — LIPID PANEL
Cholesterol: 144 mg/dL (ref 0–200)
HDL: 47 mg/dL (ref >39.00)
Triglycerides: 99 mg/dL (ref 0.0–149.0)

## 2010-12-06 LAB — POCT URINALYSIS DIPSTICK
Bilirubin, UA: NEGATIVE
Glucose, UA: NEGATIVE
Ketones, UA: NEGATIVE
Spec Grav, UA: 1.015
pH, UA: 6

## 2010-12-06 LAB — HEPATIC FUNCTION PANEL
ALT: 16 U/L (ref 0–35)
Bilirubin, Direct: 0.1 mg/dL (ref 0.0–0.3)
Total Protein: 6.5 g/dL (ref 6.0–8.3)

## 2010-12-06 LAB — BASIC METABOLIC PANEL
BUN: 20 mg/dL (ref 6–23)
CO2: 31 mEq/L (ref 19–32)
Chloride: 101 mEq/L (ref 96–112)
Creatinine, Ser: 0.7 mg/dL (ref 0.4–1.2)
Potassium: 3.8 mEq/L (ref 3.5–5.1)

## 2010-12-13 ENCOUNTER — Encounter: Payer: Self-pay | Admitting: Internal Medicine

## 2010-12-13 ENCOUNTER — Ambulatory Visit (INDEPENDENT_AMBULATORY_CARE_PROVIDER_SITE_OTHER): Payer: Medicare Other | Admitting: Internal Medicine

## 2010-12-13 DIAGNOSIS — E785 Hyperlipidemia, unspecified: Secondary | ICD-10-CM

## 2010-12-13 DIAGNOSIS — K219 Gastro-esophageal reflux disease without esophagitis: Secondary | ICD-10-CM

## 2010-12-13 DIAGNOSIS — I10 Essential (primary) hypertension: Secondary | ICD-10-CM

## 2010-12-13 NOTE — Assessment & Plan Note (Signed)
Controlled on meds

## 2010-12-13 NOTE — Progress Notes (Signed)
  Subjective:    Patient ID: [name], female    DOB: 1923/03/01, 77 y.o.   MRN: [id number]  HPI  Patient Active Problem List  Diagnoses  . HYPERLIPIDEMIA---tolerating meds  .   Marland Kitchen HYPERTENSION---tolerating meds  .   .   . GERD---tolerating meds  .   .   .    Past Medical History  Diagnosis Date  . Allergy   . GERD (gastroesophageal reflux disease)   . Depression   . Hypertension   . Osteoporosis   . Arthritis   . Hyperlipidemia   . Diverticulosis   . Spinal stenosis   . Ovarian cyst    Past Surgical History  Procedure Date  . Oophorectomy     bilateral  . Hernia repair     reports that she has quit smoking. She does not have any smokeless tobacco history on file. She reports that she drinks alcohol. She reports that she does not use illicit drugs. family history includes Cancer in her father and paternal aunt; Diabetes in her daughter; and Stroke in her sister. Allergies  Allergen Reactions  . Codeine   . Mercury   . Sulfonamide Derivatives     REACTION: rash     Review of Systems  patient denies chest pain, shortness of breath, orthopnea. Denies lower extremity edema, abdominal pain, change in appetite, change in bowel movements. Patient denies rashes, musculoskeletal complaints. No other specific complaints in a complete review of systems.      Objective:   Physical Exam  Well-developed well-nourished female in no acute distress. HEENT exam atraumatic, normocephalic, extraocular muscles are intact. Neck is supple. No jugular venous distention no thyromegaly. Chest clear to auscultation without increased work of breathing. Cardiac exam S1 and S2 are regular. Abdominal exam active bowel sounds, soft, nontender. Extremities no edema. Neurologic exam she is alert without any motor sensory deficits. Gait is normal.        Assessment & Plan:

## 2010-12-13 NOTE — Assessment & Plan Note (Signed)
controlled 

## 2010-12-13 NOTE — Assessment & Plan Note (Signed)
Ok control  

## 2011-01-17 ENCOUNTER — Telehealth: Payer: Self-pay | Admitting: Internal Medicine

## 2011-01-17 DIAGNOSIS — M810 Age-related osteoporosis without current pathological fracture: Secondary | ICD-10-CM

## 2011-01-17 NOTE — Telephone Encounter (Signed)
Order placed, pt aware to go to [place] office

## 2011-01-17 NOTE — Telephone Encounter (Signed)
ok 

## 2011-01-17 NOTE — Telephone Encounter (Signed)
Patient would like a bone density ordered.

## 2011-01-23 ENCOUNTER — Other Ambulatory Visit: Payer: Self-pay | Admitting: *Deleted

## 2011-01-23 DIAGNOSIS — E785 Hyperlipidemia, unspecified: Secondary | ICD-10-CM

## 2011-01-23 MED ORDER — SIMVASTATIN 20 MG PO TABS: 20.0000 mg | ORAL_TABLET | Freq: Every day | ORAL | Status: DC

## 2011-01-27 ENCOUNTER — Ambulatory Visit (INDEPENDENT_AMBULATORY_CARE_PROVIDER_SITE_OTHER)
Admission: RE | Admit: 2011-01-27 | Discharge: 2011-01-27 | Disposition: A | Discharge status: Home / Self Care | Payer: Medicare Other | Source: Ambulatory Visit | Attending: Internal Medicine | Admitting: Internal Medicine

## 2011-01-27 DIAGNOSIS — M810 Age-related osteoporosis without current pathological fracture: Secondary | ICD-10-CM

## 2011-02-18 ENCOUNTER — Other Ambulatory Visit: Payer: Self-pay | Admitting: *Deleted

## 2011-02-18 MED ORDER — OLMESARTAN MEDOXOMIL-HCTZ 20-12.5 MG PO TABS: 1.0000 | ORAL_TABLET | Freq: Every day | ORAL | Status: DC

## 2011-03-12 ENCOUNTER — Telehealth: Payer: Self-pay | Admitting: Internal Medicine

## 2011-03-12 NOTE — Telephone Encounter (Signed)
Pt received a prescription from another doctor for meloxicam 15mg  and has contacted other office but has not received a refill yet and would like to see if we could give her samples because she is going out of town. I informed pt what she would need to get it refilled by the doctor who prescribed it. Pt was persistent about getting samples. Please contact pt

## 2011-04-03 ENCOUNTER — Ambulatory Visit (INDEPENDENT_AMBULATORY_CARE_PROVIDER_SITE_OTHER): Payer: Medicare Other | Admitting: Obstetrics and Gynecology

## 2011-04-03 DIAGNOSIS — B3731 Acute candidiasis of vulva and vagina: Secondary | ICD-10-CM

## 2011-04-03 DIAGNOSIS — N809 Endometriosis, unspecified: Secondary | ICD-10-CM | POA: Insufficient documentation

## 2011-04-03 DIAGNOSIS — N898 Other specified noninflammatory disorders of vagina: Secondary | ICD-10-CM

## 2011-04-03 DIAGNOSIS — B373 Candidiasis of vulva and vagina: Secondary | ICD-10-CM

## 2011-04-03 DIAGNOSIS — N952 Postmenopausal atrophic vaginitis: Secondary | ICD-10-CM

## 2011-04-03 DIAGNOSIS — L293 Anogenital pruritus, unspecified: Secondary | ICD-10-CM

## 2011-04-03 NOTE — Progress Notes (Signed)
Patient came in today with vulvar irritation and itching. She feels that she has a blister on her right labia. She has used over-the-counter medication without any results. She's currently not on antibiotics.  External genitalia: There is no lesion the patient has excoriated area on her right labia so that a small area is without skin.BUS is otherwise unremarkable. Vaginal examination shows atrophic changes. Wet prep is positive for yeast.  Assessment: #1. Yeast vaginitis #2. Atrophic vaginitis  Plan: Terconazole 3 cream to use externally and internally.

## 2011-05-09 ENCOUNTER — Telehealth: Payer: Self-pay | Admitting: *Deleted

## 2011-05-09 NOTE — Telephone Encounter (Signed)
Left message on voicemail.

## 2011-05-09 NOTE — Telephone Encounter (Signed)
Pt. Is confused on how much Calcium and Vitamin D she should be taking since stopping the Fosamax.  She has Citracal that she takes bid which gives her 1000 mg of Ca.  She had previously been taking 2000 iu of Vitamin D.  She wants to know if she can take a total of 2500 iu of Vitamin D by taking both supplements???

## 2011-05-09 NOTE — Telephone Encounter (Signed)
ok 

## 2011-06-02 ENCOUNTER — Other Ambulatory Visit: Payer: Self-pay | Admitting: *Deleted

## 2011-06-02 MED ORDER — OLMESARTAN MEDOXOMIL-HCTZ 20-12.5 MG PO TABS: 1.0000 | ORAL_TABLET | Freq: Every day | ORAL | Status: DC

## 2011-06-16 ENCOUNTER — Ambulatory Visit: Payer: Medicare Other | Admitting: Internal Medicine

## 2011-06-16 ENCOUNTER — Other Ambulatory Visit: Payer: Self-pay | Admitting: *Deleted

## 2011-06-16 MED ORDER — OMEPRAZOLE 20 MG PO CPDR: 20.0000 mg | DELAYED_RELEASE_CAPSULE | Freq: Every day | ORAL | Status: DC

## 2011-06-18 ENCOUNTER — Telehealth: Payer: Self-pay | Admitting: Family Medicine

## 2011-06-18 NOTE — Telephone Encounter (Signed)
Pt called - she receoved a letter from [facility] now [facility]. They state that she has run out of refills and that they need a new Rx faxed to them on her Benicar HCT 20mg  20-12.5mg  (FAX # [phone])

## 2011-06-18 NOTE — Telephone Encounter (Signed)
Pt needs appt

## 2011-06-20 MED ORDER — OLMESARTAN MEDOXOMIL-HCTZ 20-12.5 MG PO TABS: 1.0000 | ORAL_TABLET | Freq: Every day | ORAL | Status: DC

## 2011-06-20 NOTE — Telephone Encounter (Signed)
Pt called and has already sch an ov for 07/16/11, which was the only date avail. Pls call in the Benicar to [facility] mail asap.

## 2011-06-20 NOTE — Telephone Encounter (Signed)
rx sent in electronically 

## 2011-06-23 ENCOUNTER — Other Ambulatory Visit: Payer: Self-pay | Admitting: *Deleted

## 2011-06-23 MED ORDER — DILTIAZEM HCL ER COATED BEADS 180 MG PO CP24: 180.0000 mg | ORAL_CAPSULE | Freq: Every day | ORAL | Status: DC

## 2011-07-16 ENCOUNTER — Ambulatory Visit: Payer: Medicare Other | Admitting: Internal Medicine

## 2011-08-04 ENCOUNTER — Other Ambulatory Visit: Payer: Self-pay | Admitting: *Deleted

## 2011-08-04 ENCOUNTER — Other Ambulatory Visit: Payer: Self-pay | Admitting: Internal Medicine

## 2011-08-04 DIAGNOSIS — E785 Hyperlipidemia, unspecified: Secondary | ICD-10-CM

## 2011-08-04 MED ORDER — SIMVASTATIN 20 MG PO TABS: 20.0000 mg | ORAL_TABLET | Freq: Every day | ORAL | Status: DC

## 2011-08-11 ENCOUNTER — Ambulatory Visit (INDEPENDENT_AMBULATORY_CARE_PROVIDER_SITE_OTHER): Payer: Medicare Other | Admitting: Internal Medicine

## 2011-08-11 ENCOUNTER — Encounter: Payer: Self-pay | Admitting: Internal Medicine

## 2011-08-11 DIAGNOSIS — K219 Gastro-esophageal reflux disease without esophagitis: Secondary | ICD-10-CM

## 2011-08-11 DIAGNOSIS — I10 Essential (primary) hypertension: Secondary | ICD-10-CM

## 2011-08-11 DIAGNOSIS — E785 Hyperlipidemia, unspecified: Secondary | ICD-10-CM

## 2011-08-11 LAB — BASIC METABOLIC PANEL WITH GFR
BUN: 27 mg/dL — ABNORMAL HIGH (ref 6–23)
CO2: 31 meq/L (ref 19–32)
Calcium: 9.7 mg/dL (ref 8.4–10.5)
Chloride: 99 meq/L (ref 96–112)
Creatinine, Ser: 0.8 mg/dL (ref 0.4–1.2)
GFR: 72.21 mL/min
Glucose, Bld: 83 mg/dL (ref 70–99)
Potassium: 5.3 meq/L — ABNORMAL HIGH (ref 3.5–5.1)
Sodium: 138 meq/L (ref 135–145)

## 2011-08-11 LAB — LIPID PANEL
Cholesterol: 142 mg/dL (ref 0–200)
HDL: 49.9 mg/dL
LDL Cholesterol: 68 mg/dL (ref 0–99)
Total CHOL/HDL Ratio: 3
Triglycerides: 119 mg/dL (ref 0.0–149.0)
VLDL: 23.8 mg/dL (ref 0.0–40.0)

## 2011-08-11 LAB — HEPATIC FUNCTION PANEL
ALT: 21 U/L (ref 0–35)
AST: 24 U/L (ref 0–37)
Bilirubin, Direct: 0.1 mg/dL (ref 0.0–0.3)
Total Bilirubin: 0.5 mg/dL (ref 0.3–1.2)

## 2011-08-11 LAB — TSH: TSH: 2.15 u[IU]/mL (ref 0.35–5.50)

## 2011-08-11 NOTE — Assessment & Plan Note (Signed)
Well controlled Continue meds 

## 2011-08-11 NOTE — Assessment & Plan Note (Signed)
BP Readings from Last 3 Encounters:  08/11/11 144/78  03/13/10 122/70  11/14/09 138/78  need to monitor-- Check at home

## 2011-08-11 NOTE — Assessment & Plan Note (Signed)
Well controlled on meds 

## 2011-08-14 NOTE — Progress Notes (Signed)
Patient ID: [name], female   DOB: [birth year], 78 y.o.   MRN: [id number] Patient Active Problem List  Diagnoses  . HYPERLIPIDEMIA---tolerating meds  .   Marland Kitchen HYPERTENSION---no home BPs tolerating meds  .   .   . GERDno sxs on PPI, note still on alendronate   Past Medical History  Diagnosis Date  . Allergy   . GERD (gastroesophageal reflux disease)   . Depression   . Hypertension   . Osteoporosis   . Arthritis   . Hyperlipidemia   . Diverticulosis   . Spinal stenosis   . Ovarian cyst   . Endometriosis     History   Social History  . Marital Status: Married    Spouse Name: N/A    Number of Children: N/A  . Years of Education: N/A   Occupational History  . Not on file.   Social History Main Topics  . Smoking status: Former Games developer  . Smokeless tobacco: Not on file  . Alcohol Use: Yes  . Drug Use: No  . Sexually Active:    Other Topics Concern  . Not on file   Social History Narrative  . No narrative on file    Past Surgical History  Procedure Date  . Hernia repair   . Oophorectomy     bilateral  . Hysteroscopy   . Dilation and curettage of uterus     Family History  Problem Relation Age of Onset  . Cancer Father     esophogeal  . Stroke Sister   . Diabetes Daughter   . Cancer Paternal Aunt     stomach  . Breast cancer Maternal Grandmother     Allergies  Allergen Reactions  . Codeine   . Mercury   . Sulfonamide Derivatives     REACTION: rash    Current Outpatient Prescriptions on File Prior to Visit  Medication Sig Dispense Refill  . alendronate (FOSAMAX) 70 MG tablet Take 70 mg by mouth every 7 (seven) days. Take with a full glass of water on an empty stomach.       Marland Kitchen aspirin 81 MG tablet Take 81 mg by mouth daily.        . cholecalciferol (VITAMIN D) 1000 UNITS tablet Take 1,000 Units by mouth daily.        Marland Kitchen diltiazem (CARDIZEM CD) 180 MG 24 hr capsule Take 1 capsule (180 mg total) by mouth daily.  30 capsule  5  . Flaxseed, Linseed,  (FLAX SEED OIL) 1000 MG CAPS Take 2 capsules by mouth daily.       . Multiple Vitamin (MULTIVITAMIN) tablet Take 1 tablet by mouth daily.        Marland Kitchen olmesartan-hydrochlorothiazide (BENICAR HCT) 20-12.5 MG per tablet Take 1 tablet by mouth daily.  90 tablet  0  . omeprazole (PRILOSEC) 20 MG capsule Take 1 capsule (20 mg total) by mouth daily.  90 capsule  1  . simvastatin (ZOCOR) 20 MG tablet Take 1 tablet (20 mg total) by mouth at bedtime.  30 tablet  5     patient denies chest pain, shortness of breath, orthopnea. Denies lower extremity edema, abdominal pain, change in appetite, change in bowel movements. Patient denies rashes, musculoskeletal complaints. No other specific complaints in a complete review of systems.   BP 144/78  Temp(Src) 97.5 F (36.4 C) (Oral)  Wt 162 lb (73.483 kg)  Well-developed well-nourished female in no acute distress. HEENT exam atraumatic, normocephalic, extraocular muscles are intact. Neck is supple.  No jugular venous distention no thyromegaly. Chest clear to auscultation without increased work of breathing. Cardiac exam S1 and S2 are regular. Abdominal exam active bowel sounds, soft, nontender. Extremities no edema. Neurologic exam she is alert without any motor sensory deficits. Gait is normal.

## 2011-08-20 ENCOUNTER — Telehealth: Payer: Self-pay | Admitting: *Deleted

## 2011-08-20 MED ORDER — TERCONAZOLE 0.8 % VA CREA: 1.0000 | TOPICAL_CREAM | Freq: Every day | VAGINAL | Status: AC

## 2011-08-20 NOTE — Telephone Encounter (Signed)
Pt informed with the below note. rx sent to pharmacy 

## 2011-08-20 NOTE — Telephone Encounter (Signed)
Please call patient: [name] 3, one applicator HS x3, office visit if no relief.

## 2011-08-20 NOTE — Telephone Encounter (Signed)
Pt was seen in 04/03/11 for vaginal itching and giving Terazol 3 day cream. Pt is calling today with itching as well, no discharge nor other symptoms. Pt would like refill on Terazol cream. Please advise

## 2011-09-17 ENCOUNTER — Ambulatory Visit (INDEPENDENT_AMBULATORY_CARE_PROVIDER_SITE_OTHER): Payer: Medicare Other | Admitting: Family

## 2011-09-17 ENCOUNTER — Encounter: Payer: Self-pay | Admitting: Family

## 2011-09-17 VITALS — BP 130/80 | Temp 98.4°F | Ht 64.0 in | Wt 160.0 lb

## 2011-09-17 DIAGNOSIS — I10 Essential (primary) hypertension: Secondary | ICD-10-CM

## 2011-09-17 DIAGNOSIS — J019 Acute sinusitis, unspecified: Secondary | ICD-10-CM

## 2011-09-17 DIAGNOSIS — R05 Cough: Secondary | ICD-10-CM

## 2011-09-17 DIAGNOSIS — R059 Cough, unspecified: Secondary | ICD-10-CM

## 2011-09-17 MED ORDER — DOXYCYCLINE HYCLATE 100 MG PO TABS: 100.0000 mg | ORAL_TABLET | Freq: Two times a day (BID) | ORAL | Status: AC

## 2011-09-17 NOTE — Patient Instructions (Signed)

## 2011-09-17 NOTE — Progress Notes (Signed)
Subjective:    Patient ID: [name], female    DOB: 05/05/1923, 78 y.o.   MRN: [id number]  HPI 78 year old white female, nonsmoker, patient of Dr. Timoteo Gaul is in today with complaint of sneezing, cough, congestion that tomorrow for 2 weeks. She's been taking over-the-counter Tylenol cold and sinus with no relief. As of note, her husband is home sick with similar symptoms and is currently taking doxycycline. Patient denies any lightheadedness, dizziness, chest pain, palpitations, shortness of breath or edema   Review of Systems  Constitutional: Negative.   HENT: Positive for congestion, sore throat, sneezing, postnasal drip and sinus pressure.   Respiratory: Positive for cough.   Cardiovascular: Negative.   Gastrointestinal: Negative.   Genitourinary: Negative.   Hematological: Negative.   Psychiatric/Behavioral: Negative.    Past Medical History  Diagnosis Date  . Allergy   . GERD (gastroesophageal reflux disease)   . Depression   . Hypertension   . Osteoporosis   . Arthritis   . Hyperlipidemia   . Diverticulosis   . Spinal stenosis   . Ovarian cyst   . Endometriosis     History   Social History  . Marital Status: Married    Spouse Name: N/A    Number of Children: N/A  . Years of Education: N/A   Occupational History  . Not on file.   Social History Main Topics  . Smoking status: Former Games developer  . Smokeless tobacco: Not on file  . Alcohol Use: Yes  . Drug Use: No  . Sexually Active:    Other Topics Concern  . Not on file   Social History Narrative  . No narrative on file    Past Surgical History  Procedure Date  . Hernia repair   . Oophorectomy     bilateral  . Hysteroscopy   . Dilation and curettage of uterus     Family History  Problem Relation Age of Onset  . Cancer Father     esophogeal  . Stroke Sister   . Diabetes Daughter   . Cancer Paternal Aunt     stomach  . Breast cancer Maternal Grandmother     Allergies  Allergen Reactions  .  Codeine   . Mercury   . Sulfonamide Derivatives     REACTION: rash    Current Outpatient Prescriptions on File Prior to Visit  Medication Sig Dispense Refill  . alendronate (FOSAMAX) 70 MG tablet Take 70 mg by mouth every 7 (seven) days. Take with a full glass of water on an empty stomach.       Marland Kitchen aspirin 81 MG tablet Take 81 mg by mouth daily.        . Calcium Citrate-Vitamin D (CITRACAL + D PO) Take 1 tablet by mouth daily.      . cholecalciferol (VITAMIN D) 1000 UNITS tablet Take 1,000 Units by mouth daily.        Marland Kitchen diltiazem (CARDIZEM CD) 180 MG 24 hr capsule Take 1 capsule (180 mg total) by mouth daily.  30 capsule  5  . Flaxseed, Linseed, (FLAX SEED OIL) 1000 MG CAPS Take 2 capsules by mouth daily.       . meloxicam (MOBIC) 15 MG tablet Take 15 mg by mouth daily.      . Multiple Vitamin (MULTIVITAMIN) tablet Take 1 tablet by mouth daily.        Marland Kitchen olmesartan-hydrochlorothiazide (BENICAR HCT) 20-12.5 MG per tablet Take 1 tablet by mouth daily.  90 tablet  0  .  omeprazole (PRILOSEC) 20 MG capsule Take 1 capsule (20 mg total) by mouth daily.  90 capsule  1  . simvastatin (ZOCOR) 20 MG tablet Take 1 tablet (20 mg total) by mouth at bedtime.  30 tablet  5    BP 130/80  Temp(Src) 98.4 F (36.9 C) (Oral)  Ht 5\' 4"  (1.626 m)  Wt 160 lb (72.576 kg)  BMI 27.46 kg/m2chart    Objective:   Physical Exam  Constitutional: She is oriented to person, place, and time. She appears well-developed and well-nourished.  HENT:  Right Ear: External ear normal.  Left Ear: External ear normal.  Nose: Nose normal.  Neck: Normal range of motion. Neck supple.  Cardiovascular: Normal rate, regular rhythm and normal heart sounds.   Pulmonary/Chest: Effort normal and breath sounds normal.  Abdominal: Soft. Bowel sounds are normal.  Musculoskeletal: Normal range of motion.  Neurological: She is alert and oriented to person, place, and time.  Skin: Skin is warm and dry.  Psychiatric: She has a normal  mood and affect.          Assessment & Plan:  Assessment: Hypertension, Sinusitis, cough    Plan: Amoxicillin 500 mg 2 tabs by mouth twice a day x10 days. Over-the-counter Coricidin HBP. Patient to call the office if symptoms worsen or persist, recheck as scheduled and when necessary.

## 2011-10-02 ENCOUNTER — Encounter: Payer: Self-pay | Admitting: Internal Medicine

## 2011-11-14 ENCOUNTER — Telehealth: Payer: Self-pay | Admitting: Internal Medicine

## 2011-11-14 NOTE — Telephone Encounter (Signed)
FYI.. Pt is seeing Dr. Cornelius Moras next week to consider having surgery on her hip. Pt requested to make you aware

## 2011-12-02 ENCOUNTER — Ambulatory Visit (INDEPENDENT_AMBULATORY_CARE_PROVIDER_SITE_OTHER): Payer: Medicare Other | Admitting: Internal Medicine

## 2011-12-02 ENCOUNTER — Encounter: Payer: Self-pay | Admitting: Internal Medicine

## 2011-12-02 VITALS — BP 134/86 | HR 72 | Temp 98.4°F | Wt 157.0 lb

## 2011-12-02 DIAGNOSIS — M169 Osteoarthritis of hip, unspecified: Secondary | ICD-10-CM

## 2011-12-02 NOTE — Assessment & Plan Note (Signed)
She is ok for surgery She will need preop ekg prior to surgery (she has preop visit scheduled at [facility])

## 2011-12-02 NOTE — Progress Notes (Signed)
Patient ID: [name], female   DOB: 1922-11-09, 78 y.o.   MRN: [id number] Right hip OA-- scheduled for THA  She feels well, has no additional complaints except for constipation. No cardiopulmonary complaints  No previous trouble with anesthesia  Past Medical History  Diagnosis Date  . Allergy   . GERD (gastroesophageal reflux disease)   . Depression   . Hypertension   . Osteoporosis   . Arthritis   . Hyperlipidemia   . Diverticulosis   . Spinal stenosis   . Ovarian cyst   . Endometriosis     History   Social History  . Marital Status: Married    Spouse Name: N/A    Number of Children: N/A  . Years of Education: N/A   Occupational History  . Not on file.   Social History Main Topics  . Smoking status: Former Games developer  . Smokeless tobacco: Not on file  . Alcohol Use: Yes  . Drug Use: No  . Sexually Active:    Other Topics Concern  . Not on file   Social History Narrative  . No narrative on file    Past Surgical History  Procedure Date  . Hernia repair   . Oophorectomy     bilateral  . Hysteroscopy   . Dilation and curettage of uterus   . Tibia fracture surgery     trauma    Family History  Problem Relation Age of Onset  . Cancer Father     esophogeal  . Stroke Sister   . Diabetes Daughter   . Cancer Paternal Aunt     stomach  . Breast cancer Maternal Grandmother     Allergies  Allergen Reactions  . Codeine   . Mercury   . Sulfonamide Derivatives     REACTION: rash    Current Outpatient Prescriptions on File Prior to Visit  Medication Sig Dispense Refill  . aspirin 81 MG tablet Take 81 mg by mouth daily.        . cholecalciferol (VITAMIN D) 1000 UNITS tablet Take 1,000 Units by mouth daily.        Marland Kitchen diltiazem (CARDIZEM CD) 180 MG 24 hr capsule Take 1 capsule (180 mg total) by mouth daily.  30 capsule  5  . Multiple Vitamin (MULTIVITAMIN) tablet Take 1 tablet by mouth daily.        Marland Kitchen olmesartan-hydrochlorothiazide (BENICAR HCT) 20-12.5 MG  per tablet Take 1 tablet by mouth daily.  90 tablet  0  . omeprazole (PRILOSEC) 20 MG capsule Take 1 capsule (20 mg total) by mouth daily.  90 capsule  1  . simvastatin (ZOCOR) 20 MG tablet Take 1 tablet (20 mg total) by mouth at bedtime.  30 tablet  5     patient denies chest pain, shortness of breath, orthopnea. Denies lower extremity edema, abdominal pain, change in appetite, change in bowel movements. Patient denies rashes, musculoskeletal complaints. No other specific complaints in a complete review of systems.   BP 146/88  Pulse 72  Temp(Src) 98.4 F (36.9 C) (Oral)  Wt 157 lb (71.215 kg)  Well-developed well-nourished female in no acute distress. HEENT exam atraumatic, normocephalic, extraocular muscles are intact. Neck is supple. No jugular venous distention no thyromegaly. Chest clear to auscultation without increased work of breathing. Cardiac exam S1 and S2 are regular. Abdominal exam active bowel sounds, soft, nontender. Extremities no edema. Neurologic exam she is alert without any motor sensory deficits. Walking with a cane.

## 2011-12-03 ENCOUNTER — Encounter (HOSPITAL_COMMUNITY): Payer: Self-pay | Admitting: Pharmacy Technician

## 2011-12-08 ENCOUNTER — Encounter (HOSPITAL_COMMUNITY)
Admission: RE | Admit: 2011-12-08 | Discharge: 2011-12-08 | Disposition: A | Discharge status: Home / Self Care | Payer: Medicare Other | Source: Ambulatory Visit | Attending: Orthopedic Surgery | Admitting: Orthopedic Surgery

## 2011-12-08 ENCOUNTER — Encounter (HOSPITAL_COMMUNITY): Payer: Self-pay

## 2011-12-08 ENCOUNTER — Ambulatory Visit (HOSPITAL_COMMUNITY)
Admission: RE | Admit: 2011-12-08 | Discharge: 2011-12-08 | Disposition: A | Discharge status: Home / Self Care | Payer: Medicare Other | Source: Ambulatory Visit | Attending: Orthopedic Surgery | Admitting: Orthopedic Surgery

## 2011-12-08 ENCOUNTER — Encounter (HOSPITAL_COMMUNITY): Payer: Self-pay | Admitting: *Deleted

## 2011-12-08 DIAGNOSIS — M1610 Unilateral primary osteoarthritis, unspecified hip: Secondary | ICD-10-CM | POA: Insufficient documentation

## 2011-12-08 DIAGNOSIS — H9190 Unspecified hearing loss, unspecified ear: Secondary | ICD-10-CM

## 2011-12-08 DIAGNOSIS — Z01812 Encounter for preprocedural laboratory examination: Secondary | ICD-10-CM | POA: Insufficient documentation

## 2011-12-08 DIAGNOSIS — M169 Osteoarthritis of hip, unspecified: Secondary | ICD-10-CM | POA: Insufficient documentation

## 2011-12-08 DIAGNOSIS — Z01818 Encounter for other preprocedural examination: Secondary | ICD-10-CM | POA: Insufficient documentation

## 2011-12-08 HISTORY — DX: Unspecified hearing loss, unspecified ear: H91.90

## 2011-12-08 HISTORY — PX: CATARACT EXTRACTION, BILATERAL: SHX1313

## 2011-12-08 LAB — BASIC METABOLIC PANEL
BUN: 23 mg/dL (ref 6–23)
CO2: 31 mEq/L (ref 19–32)
Calcium: 9.9 mg/dL (ref 8.4–10.5)
Chloride: 94 mEq/L — ABNORMAL LOW (ref 96–112)
Creatinine, Ser: 0.76 mg/dL (ref 0.50–1.10)

## 2011-12-08 LAB — URINE MICROSCOPIC-ADD ON

## 2011-12-08 LAB — DIFFERENTIAL
Basophils Absolute: 0.1 10*3/uL (ref 0.0–0.1)
Basophils Relative: 1 % (ref 0–1)
Eosinophils Relative: 4 % (ref 0–5)
Lymphocytes Relative: 22 % (ref 12–46)
Monocytes Absolute: 0.6 10*3/uL (ref 0.1–1.0)
Monocytes Relative: 7 % (ref 3–12)
Neutro Abs: 5.6 10*3/uL (ref 1.7–7.7)

## 2011-12-08 LAB — URINALYSIS, ROUTINE W REFLEX MICROSCOPIC
Glucose, UA: NEGATIVE mg/dL
Hgb urine dipstick: NEGATIVE
Specific Gravity, Urine: 1.022 (ref 1.005–1.030)
pH: 7 (ref 5.0–8.0)

## 2011-12-08 LAB — PROTIME-INR: Prothrombin Time: 14 seconds (ref 11.6–15.2)

## 2011-12-08 LAB — CBC
HCT: 37.6 % (ref 36.0–46.0)
Hemoglobin: 12.4 g/dL (ref 12.0–15.0)
MCHC: 33 g/dL (ref 30.0–36.0)
MCV: 93.1 fL (ref 78.0–100.0)
RDW: 11.7 % (ref 11.5–15.5)

## 2011-12-08 MED ORDER — CHLORHEXIDINE GLUCONATE 4 % EX LIQD
60.0000 mL | Freq: Once | CUTANEOUS | Status: DC
  Filled 2011-12-08: qty 60

## 2011-12-08 NOTE — Pre-Procedure Instructions (Addendum)
12-08-11 EKG/CXR done today. Teach back method used.W. Nobie Alleyne,RN. 12-09-11 0840 Pt. Notified of Positive MRSA PCR screen will be on Contact Isolation upon arrival, and will use Mupirocin as directed. W. Kennon Portela

## 2011-12-08 NOTE — Patient Instructions (Signed)
20 AHRIA SLAPPEY  12/08/2011   Your procedure is scheduled on: 5-28  -2013  Report to [facility] at    0945    AM.  Call this number if you have problems the morning of surgery: [phone]   Remember:   Do not eat food:After Midnight.  May have clear liquids:up to 6 Hours before arrival. Nothing after :0600 AM  Clear liquids include soda, tea, black coffee, apple or grape juice, broth.  Take these medicines the morning of surgery with A SIP OF WATER: Hydrocodone, Cardizem, Prilosec.   Do not wear jewelry, make-up or nail polish.  Do not wear lotions, powders, or perfumes. You may wear deodorant.  Do not shave 48 hours prior to surgery.(face and neck okay, no shaving of legs)  Do not bring valuables to the hospital.  Contacts, dentures or bridgework may not be worn into surgery.  Leave suitcase in the car. After surgery it may be brought to your room.  For patients admitted to the hospital, checkout time is 11:00 AM the day of discharge.   Patients discharged the day of surgery will not be allowed to drive home.  Name and phone number of your driver: family  Special Instructions: CHG Shower Use Special Wash: 1/2 bottle night before surgery and 1/2 bottle morning of surgery.(avoid face and genitals)   Please read over the following fact sheets that you were given: MRSA Information, Blood Transfusion fact sheet, Incentive Spirometry Instruction.

## 2011-12-09 HISTORY — PX: STERIOD INJECTION: SHX5046

## 2011-12-10 NOTE — Progress Notes (Signed)
H&P performed 12/10/11 Dictation # [id number]

## 2011-12-11 NOTE — H&P (Signed)
[name], [name] NO.:  [id number]  MEDICAL RECORD NO.:  [id number]  LOCATION:                               FACILITY:  [facility]  PHYSICIAN:  Madlyn Frankel. Charlann Boxer, M.D.  DATE OF BIRTH:  17-Feb-1923  DATE OF ADMISSION:  12/16/2011 DATE OF DISCHARGE:                             HISTORY & PHYSICAL   ADMITTING DIAGNOSIS:  End-stage osteoarthritis of the right hip.  HISTORY OF PRESENT ILLNESS:  This is an 78 year old lady with a history of end-stage osteoarthritis of her right hip which has failed conservative measures.  Due to continued pain and discomfort in the hip, she is now scheduled for total hip arthroplasty by anterior approach. The surgery, risks, benefits, and aftercare were discussed in detail with the patient, questions invited and answered.  Note that her medical doctor is Dr. [name].  She does plan on going home after surgery though she may need to go to a nursing facility based on how she is doing.  She is a candidate for tranexamic acid and dexamethasone and will receive both at surgery, and she was not given her home medications today.  PAST MEDICAL HISTORY:  Drug allergy to sulfa with a rash.  Codeine with an episode of vasovagal, though she can take hydrocodone without problems.  Also topical mercury with a rash.  CURRENT MEDICATIONS: 1. Vitamin D 1000 units daily. 2. One a Day Women's one daily. 3. Omega-3 one daily. 4. Benicar/hydrochlorothiazide 20/12.5 one daily. 5. Simvastatin 20 mg 1 daily. 6. Diltiazem HCl ER 180 mg one daily. 7. Omeprazole 40 mg one daily. 8. Aspirin 81 mg daily. 9. Tylenol p.r.n. 10.Hydrocodone 5/325 one q.6 p.r.n. pain.  PREVIOUS SURGERIES: 1. Excision of benign breast mass. 2. Bilateral cataracts. 3. Herniorrhaphy. 4. Left ankle surgery.  FAMILY HISTORY:  Positive for cancer, stroke, and diabetes.  SOCIAL HISTORY:  The patient is married.  She lives at home.  She does not smoke, drinks 2 glasses of wine  per day, and again plans on going home after surgery but may need to go to a skilled nursing facility based on how she is doing.  REVIEW OF SYSTEMS:  CENTRAL NERVOUS SYSTEM:  Positive for impaired hearing.  PULMONARY:  Negative for shortness of breath, PND, and orthopnea.  CARDIOVASCULAR:  Negative for chest pain or palpitation. GI:  Positive for reflux.  Negative for ulcers or hepatitis.  GU: Negative for urinary tract difficulties.  MUSCULOSKELETAL:  Positive as in HPI.  PHYSICAL EXAM:  VITAL SIGNS:  Blood pressure 122/88, pulse 72 and regular, respirations 14. HEENT.  Head normocephalic.  Nose patent.  Ears patent.  Pupils equal, round, and reactive to light.  Throat without injection. NECK:  Supple without adenopathy.  Carotids 2+ without bruit. CHEST:  Clear to auscultation.  No rales or rhonchi.  Respirations 14. HEART:  Regular rate and rhythm at 72 beats per minute without murmur. ABDOMEN:  Soft with active bowel sounds.  No masses or organomegaly. NEUROLOGIC:  The patient is alert and oriented to time, place, and person.  Cranial nerves 2-12 grossly intact. EXTREMITIES:  Right hip with markedly decreased range of motion with 5 degrees  from full extension, flexion to 85 degrees, external rotation of 25, and internal rotation to neutral.  The left hip shows full extension flexion to 125, external rotation of 40, and internal rotation of 25. Neurovascular status is intact.  Dorsalis pedis and posterior tibialis pulses are 2+.  IMPRESSION:  End-stage osteoarthritis of the right hip.  PLAN:  Total hip arthroplasty, right hip, by anterior approach.     [name]. [name], P.A.   ______________________________ Madlyn Frankel Charlann Boxer, M.D.    SJC/MEDQ  D:  12/10/2011  T:  12/11/2011  Job:  [id number]

## 2011-12-16 ENCOUNTER — Inpatient Hospital Stay (HOSPITAL_COMMUNITY)
Admission: RE | Admit: 2011-12-16 | Discharge: 2011-12-19 | DRG: 470 | Disposition: A | Discharge status: Skilled Nursing Facility | Payer: Medicare Other | Source: Ambulatory Visit | Attending: Orthopedic Surgery | Admitting: Orthopedic Surgery

## 2011-12-16 ENCOUNTER — Encounter (HOSPITAL_COMMUNITY): Payer: Self-pay

## 2011-12-16 ENCOUNTER — Encounter (HOSPITAL_COMMUNITY)
Admission: RE | Disposition: A | Discharge status: Skilled Nursing Facility | Payer: Self-pay | Source: Ambulatory Visit | Attending: Orthopedic Surgery

## 2011-12-16 ENCOUNTER — Ambulatory Visit (HOSPITAL_COMMUNITY): Payer: Medicare Other

## 2011-12-16 ENCOUNTER — Encounter (HOSPITAL_COMMUNITY): Payer: Self-pay | Admitting: Certified Registered Nurse Anesthetist

## 2011-12-16 ENCOUNTER — Ambulatory Visit (HOSPITAL_COMMUNITY): Payer: Medicare Other | Admitting: Certified Registered Nurse Anesthetist

## 2011-12-16 DIAGNOSIS — E785 Hyperlipidemia, unspecified: Secondary | ICD-10-CM

## 2011-12-16 DIAGNOSIS — I10 Essential (primary) hypertension: Secondary | ICD-10-CM | POA: Diagnosis present

## 2011-12-16 DIAGNOSIS — Z96649 Presence of unspecified artificial hip joint: Secondary | ICD-10-CM

## 2011-12-16 DIAGNOSIS — F3289 Other specified depressive episodes: Secondary | ICD-10-CM | POA: Diagnosis present

## 2011-12-16 DIAGNOSIS — F329 Major depressive disorder, single episode, unspecified: Secondary | ICD-10-CM | POA: Diagnosis present

## 2011-12-16 DIAGNOSIS — M169 Osteoarthritis of hip, unspecified: Principal | ICD-10-CM | POA: Diagnosis present

## 2011-12-16 DIAGNOSIS — M1610 Unilateral primary osteoarthritis, unspecified hip: Principal | ICD-10-CM | POA: Diagnosis present

## 2011-12-16 HISTORY — DX: Presence of unspecified artificial hip joint: Z96.649

## 2011-12-16 HISTORY — PX: TOTAL HIP ARTHROPLASTY: SHX124

## 2011-12-16 LAB — TYPE AND SCREEN
ABO/RH(D): O POS
Antibody Screen: NEGATIVE

## 2011-12-16 LAB — ABO/RH: ABO/RH(D): O POS

## 2011-12-16 SURGERY — ARTHROPLASTY, HIP, TOTAL, ANTERIOR APPROACH
Anesthesia: Spinal | Site: Hip | Laterality: Right | Wound class: Clean

## 2011-12-16 MED ORDER — LACTATED RINGERS IV SOLN
INTRAVENOUS | Status: DC
  Administered 2011-12-16: 13:00:00 via INTRAVENOUS
  Administered 2011-12-16: 1000 mL via INTRAVENOUS
  Administered 2011-12-16: 14:00:00 via INTRAVENOUS

## 2011-12-16 MED ORDER — SODIUM CHLORIDE 0.9 % IV SOLN
100.0000 mL/h | INTRAVENOUS | Status: AC
  Administered 2011-12-16: 100 mL/h via INTRAVENOUS
  Filled 2011-12-16 (×4): qty 1000

## 2011-12-16 MED ORDER — HYDROCODONE-ACETAMINOPHEN 7.5-325 MG PO TABS
1.0000 | ORAL_TABLET | ORAL | Status: DC
  Administered 2011-12-16 – 2011-12-17 (×3): 2 via ORAL
  Administered 2011-12-17 (×2): 1 via ORAL
  Administered 2011-12-17 – 2011-12-18 (×3): 2 via ORAL
  Administered 2011-12-18 (×2): 1 via ORAL
  Administered 2011-12-18 (×2): 2 via ORAL
  Administered 2011-12-19: 1 via ORAL
  Administered 2011-12-19 (×3): 2 via ORAL
  Filled 2011-12-16: qty 1
  Filled 2011-12-16 (×3): qty 2
  Filled 2011-12-16: qty 1
  Filled 2011-12-16 (×3): qty 2
  Filled 2011-12-16 (×2): qty 1
  Filled 2011-12-16: qty 2
  Filled 2011-12-16 (×3): qty 1
  Filled 2011-12-16 (×3): qty 2

## 2011-12-16 MED ORDER — FENTANYL CITRATE 0.05 MG/ML IJ SOLN
INTRAMUSCULAR | Status: AC
  Filled 2011-12-16: qty 2

## 2011-12-16 MED ORDER — CEFAZOLIN SODIUM 1-5 GM-% IV SOLN
1.0000 g | Freq: Four times a day (QID) | INTRAVENOUS | Status: AC
  Administered 2011-12-16 – 2011-12-17 (×3): 1 g via INTRAVENOUS
  Filled 2011-12-16 (×3): qty 50

## 2011-12-16 MED ORDER — 0.9 % SODIUM CHLORIDE (POUR BTL) OPTIME
TOPICAL | Status: DC | PRN
  Administered 2011-12-16: 1000 mL

## 2011-12-16 MED ORDER — MEPERIDINE HCL 50 MG/ML IJ SOLN: 6.2500 mg | INTRAMUSCULAR | Status: DC | PRN

## 2011-12-16 MED ORDER — ONDANSETRON HCL 4 MG/2ML IJ SOLN: 4.0000 mg | Freq: Four times a day (QID) | INTRAMUSCULAR | Status: DC | PRN

## 2011-12-16 MED ORDER — HYDROCHLOROTHIAZIDE 12.5 MG PO CAPS
12.5000 mg | ORAL_CAPSULE | Freq: Every day | ORAL | Status: DC
  Administered 2011-12-17 – 2011-12-19 (×3): 12.5 mg via ORAL
  Filled 2011-12-16 (×3): qty 1

## 2011-12-16 MED ORDER — EPHEDRINE SULFATE 50 MG/ML IJ SOLN
INTRAMUSCULAR | Status: DC | PRN
  Administered 2011-12-16: 10 mg via INTRAVENOUS
  Administered 2011-12-16: 5 mg via INTRAVENOUS

## 2011-12-16 MED ORDER — ZOLPIDEM TARTRATE 5 MG PO TABS
5.0000 mg | ORAL_TABLET | Freq: Every evening | ORAL | Status: DC | PRN
  Administered 2011-12-16: 5 mg via ORAL
  Filled 2011-12-16: qty 1

## 2011-12-16 MED ORDER — FERROUS SULFATE 325 (65 FE) MG PO TABS
325.0000 mg | ORAL_TABLET | Freq: Three times a day (TID) | ORAL | Status: DC
  Administered 2011-12-16 – 2011-12-19 (×8): 325 mg via ORAL
  Filled 2011-12-16 (×11): qty 1

## 2011-12-16 MED ORDER — DOCUSATE SODIUM 100 MG PO CAPS
100.0000 mg | ORAL_CAPSULE | Freq: Two times a day (BID) | ORAL | Status: DC
  Administered 2011-12-16 – 2011-12-19 (×6): 100 mg via ORAL

## 2011-12-16 MED ORDER — SIMVASTATIN 20 MG PO TABS: 20.0000 mg | ORAL_TABLET | Freq: Every day | ORAL | Status: DC

## 2011-12-16 MED ORDER — CEFAZOLIN SODIUM 1-5 GM-% IV SOLN
1.0000 g | INTRAVENOUS | Status: AC
  Administered 2011-12-16: 1 g via INTRAVENOUS

## 2011-12-16 MED ORDER — ONDANSETRON HCL 4 MG PO TABS
4.0000 mg | ORAL_TABLET | Freq: Four times a day (QID) | ORAL | Status: DC | PRN
  Administered 2011-12-19: 4 mg via ORAL
  Filled 2011-12-16 (×2): qty 1

## 2011-12-16 MED ORDER — FLEET ENEMA 7-19 GM/118ML RE ENEM: 1.0000 | ENEMA | Freq: Once | RECTAL | Status: AC | PRN

## 2011-12-16 MED ORDER — LACTATED RINGERS IV SOLN: INTRAVENOUS | Status: DC

## 2011-12-16 MED ORDER — GLYCERIN (LAXATIVE) 2 G RE SUPP
1.0000 | Freq: Once | RECTAL | Status: AC | PRN
  Filled 2011-12-16: qty 1

## 2011-12-16 MED ORDER — BUPIVACAINE HCL (PF) 0.5 % IJ SOLN
INTRAMUSCULAR | Status: DC | PRN
  Administered 2011-12-16: 15 mg

## 2011-12-16 MED ORDER — FENTANYL CITRATE 0.05 MG/ML IJ SOLN
INTRAMUSCULAR | Status: DC | PRN
  Administered 2011-12-16 (×2): 50 ug via INTRAVENOUS
  Administered 2011-12-16: 100 ug via INTRAVENOUS

## 2011-12-16 MED ORDER — BISACODYL 5 MG PO TBEC
5.0000 mg | DELAYED_RELEASE_TABLET | Freq: Every day | ORAL | Status: DC | PRN
  Administered 2011-12-18: 5 mg via ORAL
  Filled 2011-12-16: qty 1

## 2011-12-16 MED ORDER — ONDANSETRON HCL 4 MG/2ML IJ SOLN
4.0000 mg | Freq: Once | INTRAMUSCULAR | Status: AC
  Administered 2011-12-16: 4 mg via INTRAVENOUS

## 2011-12-16 MED ORDER — HYDROMORPHONE HCL PF 1 MG/ML IJ SOLN
INTRAMUSCULAR | Status: AC
  Filled 2011-12-16: qty 1

## 2011-12-16 MED ORDER — ALUM & MAG HYDROXIDE-SIMETH 200-200-20 MG/5ML PO SUSP
30.0000 mL | ORAL | Status: DC | PRN
  Filled 2011-12-16: qty 30

## 2011-12-16 MED ORDER — DILTIAZEM HCL ER COATED BEADS 180 MG PO CP24
180.0000 mg | ORAL_CAPSULE | Freq: Every day | ORAL | Status: DC
  Administered 2011-12-17 – 2011-12-19 (×2): 180 mg via ORAL
  Filled 2011-12-16 (×4): qty 1

## 2011-12-16 MED ORDER — ATORVASTATIN CALCIUM 10 MG PO TABS
10.0000 mg | ORAL_TABLET | Freq: Every day | ORAL | Status: DC
  Administered 2011-12-16 – 2011-12-18 (×3): 10 mg via ORAL
  Filled 2011-12-16 (×5): qty 1

## 2011-12-16 MED ORDER — MENTHOL 3 MG MT LOZG
1.0000 | LOZENGE | OROMUCOSAL | Status: DC | PRN
  Filled 2011-12-16: qty 9

## 2011-12-16 MED ORDER — POLYETHYLENE GLYCOL 3350 17 G PO PACK
17.0000 g | PACK | Freq: Two times a day (BID) | ORAL | Status: DC
  Administered 2011-12-16 – 2011-12-19 (×5): 17 g via ORAL

## 2011-12-16 MED ORDER — HYDROMORPHONE HCL PF 1 MG/ML IJ SOLN: 0.5000 mg | INTRAMUSCULAR | Status: DC | PRN

## 2011-12-16 MED ORDER — BUPIVACAINE HCL (PF) 0.5 % IJ SOLN
INTRAMUSCULAR | Status: AC
  Filled 2011-12-16: qty 30

## 2011-12-16 MED ORDER — PANTOPRAZOLE SODIUM 40 MG PO TBEC
40.0000 mg | DELAYED_RELEASE_TABLET | Freq: Every day | ORAL | Status: DC
  Administered 2011-12-17 – 2011-12-19 (×3): 40 mg via ORAL
  Filled 2011-12-16 (×3): qty 1

## 2011-12-16 MED ORDER — CEFAZOLIN SODIUM 1-5 GM-% IV SOLN
INTRAVENOUS | Status: AC
  Filled 2011-12-16: qty 50

## 2011-12-16 MED ORDER — DEXAMETHASONE SODIUM PHOSPHATE 10 MG/ML IJ SOLN
10.0000 mg | Freq: Once | INTRAMUSCULAR | Status: AC
  Administered 2011-12-16: 10 mg via INTRAVENOUS

## 2011-12-16 MED ORDER — FENTANYL CITRATE 0.05 MG/ML IJ SOLN
50.0000 ug | INTRAMUSCULAR | Status: DC | PRN
  Administered 2011-12-16: 100 ug via INTRAVENOUS

## 2011-12-16 MED ORDER — ACETAMINOPHEN 10 MG/ML IV SOLN
INTRAVENOUS | Status: AC
  Filled 2011-12-16: qty 100

## 2011-12-16 MED ORDER — DIPHENHYDRAMINE HCL 25 MG PO CAPS: 25.0000 mg | ORAL_CAPSULE | Freq: Four times a day (QID) | ORAL | Status: DC | PRN

## 2011-12-16 MED ORDER — PROPOFOL 10 MG/ML IV EMUL
INTRAVENOUS | Status: DC | PRN
  Administered 2011-12-16: 25 ug/kg/min via INTRAVENOUS

## 2011-12-16 MED ORDER — HYDROMORPHONE HCL PF 1 MG/ML IJ SOLN
0.2500 mg | INTRAMUSCULAR | Status: DC | PRN
  Administered 2011-12-16 (×2): 0.5 mg via INTRAVENOUS

## 2011-12-16 MED ORDER — METHOCARBAMOL 100 MG/ML IJ SOLN
500.0000 mg | Freq: Four times a day (QID) | INTRAVENOUS | Status: DC | PRN
  Administered 2011-12-16: 500 mg via INTRAVENOUS
  Filled 2011-12-16 (×2): qty 5

## 2011-12-16 MED ORDER — SODIUM CHLORIDE 0.9 % IV SOLN
20.0000 mg | INTRAVENOUS | Status: DC | PRN
  Administered 2011-12-16: 25 ug/min via INTRAVENOUS

## 2011-12-16 MED ORDER — PHENOL 1.4 % MT LIQD
1.0000 | OROMUCOSAL | Status: DC | PRN
Start: 2011-12-16 — End: 2011-12-19
  Filled 2011-12-16: qty 177

## 2011-12-16 MED ORDER — RIVAROXABAN 10 MG PO TABS
10.0000 mg | ORAL_TABLET | ORAL | Status: DC
  Administered 2011-12-17 – 2011-12-19 (×3): 10 mg via ORAL
  Filled 2011-12-16 (×5): qty 1

## 2011-12-16 MED ORDER — OLMESARTAN MEDOXOMIL-HCTZ 20-12.5 MG PO TABS: 1.0000 | ORAL_TABLET | Freq: Every day | ORAL | Status: DC

## 2011-12-16 MED ORDER — IRBESARTAN 150 MG PO TABS
150.0000 mg | ORAL_TABLET | Freq: Every day | ORAL | Status: DC
  Administered 2011-12-17 – 2011-12-19 (×2): 150 mg via ORAL
  Filled 2011-12-16 (×3): qty 1

## 2011-12-16 MED ORDER — METHOCARBAMOL 500 MG PO TABS
500.0000 mg | ORAL_TABLET | Freq: Four times a day (QID) | ORAL | Status: DC | PRN
  Administered 2011-12-19: 500 mg via ORAL
  Filled 2011-12-16: qty 1

## 2011-12-16 MED ORDER — TRANEXAMIC ACID 100 MG/ML IV SOLN
1050.0000 mg | Freq: Once | INTRAVENOUS | Status: AC
  Administered 2011-12-16: 1050 mg via INTRAVENOUS
  Filled 2011-12-16: qty 10.5

## 2011-12-16 MED ORDER — ONDANSETRON HCL 4 MG/2ML IJ SOLN
INTRAMUSCULAR | Status: AC
  Filled 2011-12-16: qty 2

## 2011-12-16 MED ORDER — PROMETHAZINE HCL 25 MG/ML IJ SOLN: 6.2500 mg | INTRAMUSCULAR | Status: DC | PRN

## 2011-12-16 MED ORDER — RIVAROXABAN 10 MG PO TABS
10.0000 mg | ORAL_TABLET | Freq: Every day | ORAL | Status: DC
  Filled 2011-12-16: qty 1

## 2011-12-16 SURGICAL SUPPLY — 39 items
BAG ZIPLOCK 12X15 (MISCELLANEOUS) ×4 IMPLANT
BLADE SAW SGTL 18X1.27X75 (BLADE) ×2 IMPLANT
CELLS DAT CNTRL 66122 CELL SVR (MISCELLANEOUS) ×1 IMPLANT
CLOTH BEACON ORANGE TIMEOUT ST (SAFETY) ×2 IMPLANT
DERMABOND ADVANCED (GAUZE/BANDAGES/DRESSINGS) ×1
DERMABOND ADVANCED .7 DNX12 (GAUZE/BANDAGES/DRESSINGS) ×1 IMPLANT
DRAPE C-ARM 42X72 X-RAY (DRAPES) ×2 IMPLANT
DRAPE STERI IOBAN 125X83 (DRAPES) ×2 IMPLANT
DRAPE U-SHAPE 47X51 STRL (DRAPES) ×6 IMPLANT
DRSG AQUACEL AG ADV 3.5X10 (GAUZE/BANDAGES/DRESSINGS) ×2 IMPLANT
DRSG TEGADERM 4X4.75 (GAUZE/BANDAGES/DRESSINGS) ×2 IMPLANT
DURAPREP 26ML APPLICATOR (WOUND CARE) ×2 IMPLANT
ELECT BLADE TIP CTD 4 INCH (ELECTRODE) ×2 IMPLANT
ELECT REM PT RETURN 9FT ADLT (ELECTROSURGICAL) ×2
ELECTRODE REM PT RTRN 9FT ADLT (ELECTROSURGICAL) ×1 IMPLANT
EVACUATOR 1/8 PVC DRAIN (DRAIN) IMPLANT
FACESHIELD LNG OPTICON STERILE (SAFETY) ×10 IMPLANT
GAUZE SPONGE 2X2 8PLY STRL LF (GAUZE/BANDAGES/DRESSINGS) ×1 IMPLANT
GLOVE BIOGEL PI IND STRL 7.5 (GLOVE) ×1 IMPLANT
GLOVE BIOGEL PI IND STRL 8 (GLOVE) ×1 IMPLANT
GLOVE BIOGEL PI INDICATOR 7.5 (GLOVE) ×1
GLOVE BIOGEL PI INDICATOR 8 (GLOVE) ×1
GLOVE ECLIPSE 8.0 STRL XLNG CF (GLOVE) ×2 IMPLANT
GLOVE ORTHO TXT STRL SZ7.5 (GLOVE) ×4 IMPLANT
GOWN BRE IMP PREV XXLGXLNG (GOWN DISPOSABLE) IMPLANT
GOWN STRL NON-REIN LRG LVL3 (GOWN DISPOSABLE) IMPLANT
KIT BASIN OR (CUSTOM PROCEDURE TRAY) ×2 IMPLANT
PACK TOTAL JOINT (CUSTOM PROCEDURE TRAY) ×2 IMPLANT
PADDING CAST COTTON 6X4 STRL (CAST SUPPLIES) ×2 IMPLANT
RTRCTR WOUND ALEXIS 18CM MED (MISCELLANEOUS) ×2
SPONGE GAUZE 2X2 STER 10/PKG (GAUZE/BANDAGES/DRESSINGS) ×1
SUCTION FRAZIER 12FR DISP (SUCTIONS) ×2 IMPLANT
SUT MNCRL AB 4-0 PS2 18 (SUTURE) ×2 IMPLANT
SUT VIC AB 1 CT1 36 (SUTURE) ×8 IMPLANT
SUT VIC AB 2-0 CT1 27 (SUTURE) ×2
SUT VIC AB 2-0 CT1 TAPERPNT 27 (SUTURE) ×2 IMPLANT
SUT VLOC 180 0 24IN GS25 (SUTURE) ×2 IMPLANT
TOWEL OR 17X26 10 PK STRL BLUE (TOWEL DISPOSABLE) IMPLANT
TRAY FOLEY CATH 14FRSI W/METER (CATHETERS) ×2 IMPLANT

## 2011-12-16 NOTE — Anesthesia Procedure Notes (Addendum)
Spinal   Spinal  Patient location during procedure: OR Start time: 12/16/2011 12:30 PM End time: 12/16/2011 12:35 PM Staffing CRNA/Resident: Carmelia Roller R Performed by: resident/CRNA  Preanesthetic Checklist Completed: patient identified, site marked, surgical consent, pre-op evaluation, timeout performed, IV checked, risks and benefits discussed and monitors and equipment checked Spinal Block Patient position: sitting Prep: Betadine Patient monitoring: heart rate, cardiac monitor, continuous pulse ox and blood pressure Approach: midline Location: L2-3 Injection technique: single-shot Needle Needle type: Other  Needle gauge: 22 G Needle length: 9 cm Assessment Sensory level: T4

## 2011-12-16 NOTE — Op Note (Signed)
NAME:  [name]                ACCOUNT NO.: [id number]      MEDICAL RECORD NO.: [id number]      FACILITY:  [facility]      PHYSICIAN:  Durene Romans D  DATE OF BIRTH:  April 13, 1923     DATE OF PROCEDURE:  12/16/2011                                 OPERATIVE REPORT         PREOPERATIVE DIAGNOSIS: RIGHT  hip osteoarthritis.      POSTOPERATIVE DIAGNOSIS:  Right hip osteoarthritis.      PROCEDURE:  Right total hip replacement through an anterior approach   utilizing DePuy THR system, component size 50mm pinnacle cup, a size 32+4 neutral   Altrex liner, a size 6 standard Tri Lock stem with a 32+1 metal ball.      SURGEON:  Madlyn Frankel. Charlann Boxer, M.D.      ASSISTANT:  Lanney Gins, PA      ANESTHESIA:  Spinal.      SPECIMENS:  None.      COMPLICATIONS:  None.      BLOOD LOSS:  300 cc     DRAINS:  One Hemovac.      INDICATION OF THE PROCEDURE:  [name] is a 78 y.o. female who had   presented to office for evaluation of right hip pain.  Radiographs revealed   progressive degenerative changes with bone-on-bone   articulation to the  hip joint.  The patient had painful limited range of   motion significantly affecting their overall quality of life.  The patient was failing to    respond to conservative measures, and at this point was ready   to proceed with more definitive measures.  The patient has noted progressive   degenerative changes in his hip, progressive problems and dysfunction   with regarding the hip prior to surgery.  Consent was obtained for   benefit of pain relief.  Specific risk of infection, DVT, component   failure, dislocation, need for revision surgery, as well discussion of   the anterior versus posterior approach were reviewed.  Consent was   obtained for benefit of anterior pain relief through an anterior   approach.      PROCEDURE IN DETAIL:  The patient was brought to operative theater.   Once adequate anesthesia, preoperative antibiotics, 1gm Ancef  administered.   The patient was positioned supine on the OSI Hanna table.  Once adequate   padding of boney process was carried out, we had predraped out the hip, and  used fluoroscopy to confirm orientation of the pelvis and position.      The right hip was then prepped and draped from proximal iliac crest to   mid thigh with shower curtain technique.      Time-out was performed identifying the patient, planned procedure, and   extremity.     An incision was then made 2 cm distal and lateral to the   anterior superior iliac spine extending over the orientation of the   tensor fascia lata muscle and sharp dissection was carried down to the   fascia of the muscle and protractor placed in the soft tissues.      The fascia was then incised.  The muscle belly was identified and swept   laterally and retractor  placed along the superior neck.  Following   cauterization of the circumflex vessels and removing some pericapsular   fat, a second cobra retractor was placed on the inferior neck.  A third   retractor was placed on the anterior acetabulum after elevating the   anterior rectus.  A L-capsulotomy was along the line of the   superior neck to the trochanteric fossa, then extended proximally and   distally.  Tag sutures were placed and the retractors were then placed   intracapsular.  We then identified the trochanteric fossa and   orientation of my neck cut, confirmed this radiographically   and then made a neck osteotomy with the femur on traction.  The femoral   head was removed without difficulty or complication.  Traction was let   off and retractors were placed posterior and anterior around the   acetabulum.      The labrum and foveal tissue were debrided.  I began reaming with a 45mm   reamer and reamed up to 49mm reamer with good bony bed preparation and a 50   cup was chosen.  The final 50mm Pinnacle cup was then impacted under fluoroscopy  to confirm the depth of penetration  and orientation with respect to   abduction.  A screw was placed followed by the hole eliminator.  The final   32+4 Altrex liner was impacted with good visualized rim fit.  The cup was positioned anatomically within the acetabular portion of the pelvis.      At this point, the femur was rolled at 80 degrees.  Further capsule was   released off the inferior aspect of the femoral neck.  I then   released the superior capsule proximally.  The hook was placed laterally   along the femur and elevated manually and held in position with the bed   hook.  The leg was then extended and adducted with the leg rolled to 100   degrees of external rotation.  Once the proximal femur was fully   exposed, I used a box osteotome to set orientation.  I then began   broaching with the starting chili pepper broach and passed this by hand and then broached up to 6.  With the 6 broach in place I chose a standard offset neck and did a trial reduction.  The offset was appropriate, leg lengths   appeared to be equal, confirmed radiographically.   Given these findings, I went ahead and dislocated the hip, repositioned all   retractors and positioned the right hip in the extended and abducted position.  The final 6 standard Tri Lock stem was   chosen and it was impacted down to the level of neck cut.  Based on this   and the trial reduction, a 32+26metal ball was chosen and   impacted onto a clean and dry trunnion, and the hip was reduced.  The   hip had been irrigated throughout the case again at this point.  I did   reapproximate the superior capsular leaflet to the anterior leaflet   using #1 Vicryl, placed a medium Hemovac drain deep.  The fascia of the   tensor fascia lata muscle was then reapproximated using #1 Vicryl.  The   remaining wound was closed with 2-0 Vicryl and running 4-0 Monocryl.   The hip was cleaned, dried, and dressed sterilely using Dermabond and   Aquacel dressing.  Drain site dressed separately.   She was then brought   to recovery  room in stable condition tolerating the procedure well.    Lanney Gins, PA-C was present for the entirety of the case involved from   preoperative positioning, perioperative retractor management, general   facilitation of the case, as well as primary wound closure as assistant.            Madlyn Frankel Charlann Boxer, M.D.            MDO/MEDQ  D:  05/13/2011  T:  05/13/2011  Job:  [id number]      Electronically Signed by Durene Romans M.D. on 05/19/2011 09:15:38 AM

## 2011-12-16 NOTE — Anesthesia Preprocedure Evaluation (Signed)
Anesthesia Evaluation  Patient identified by MRN, date of birth, ID band Patient awake    Reviewed: Allergy & Precautions, H&P , NPO status , Patient's Chart, lab work & pertinent test results  Airway Mallampati: II TM Distance: >3 FB Neck ROM: Full    Dental No notable dental hx.    Pulmonary neg pulmonary ROS,  breath sounds clear to auscultation  Pulmonary exam normal       Cardiovascular hypertension, Pt. on medications negative cardio ROS  Rhythm:Regular Rate:Normal     Neuro/Psych PSYCHIATRIC DISORDERS Depression negative neurological ROS  negative psych ROS   GI/Hepatic negative GI ROS, Neg liver ROS,   Endo/Other  negative endocrine ROS  Renal/GU negative Renal ROS  negative genitourinary   Musculoskeletal negative musculoskeletal ROS (+)   Abdominal   Peds negative pediatric ROS (+)  Hematology negative hematology ROS (+)   Anesthesia Other Findings   Reproductive/Obstetrics negative OB ROS                           Anesthesia Physical Anesthesia Plan  ASA: II  Anesthesia Plan: Spinal   Post-op Pain Management:    Induction:   Airway Management Planned: Simple Face Mask  Additional Equipment:   Intra-op Plan:   Post-operative Plan:   Informed Consent: I have reviewed the patients History and Physical, chart, labs and discussed the procedure including the risks, benefits and alternatives for the proposed anesthesia with the patient or authorized representative who has indicated his/her understanding and acceptance.   Dental advisory given  Plan Discussed with: CRNA  Anesthesia Plan Comments:         Anesthesia Quick Evaluation

## 2011-12-16 NOTE — Anesthesia Postprocedure Evaluation (Signed)
  Anesthesia Post-op Note  Patient: [name]  Procedure(s) Performed: Procedure(s) (LRB): TOTAL HIP ARTHROPLASTY ANTERIOR APPROACH (Right)  Patient Location: PACU  Anesthesia Type: SAB  Level of Consciousness: awake and alert   Airway and Oxygen Therapy: Patient Spontanous Breathing  Post-op Pain: mild  Post-op Assessment: Post-op Vital signs reviewed, Patient's Cardiovascular Status Stable, Respiratory Function Stable, Patent Airway and No signs of Nausea or vomiting  Post-op Vital Signs: stable  Complications: No apparent anesthesia complications

## 2011-12-16 NOTE — Transfer of Care (Signed)
Immediate Anesthesia Transfer of Care Note  Patient: [name]  Procedure(s) Performed: Procedure(s) (LRB): TOTAL HIP ARTHROPLASTY ANTERIOR APPROACH (Right)  Patient Location: PACU  Anesthesia Type: Regional  Level of Consciousness: awake, alert  and oriented  Airway & Oxygen Therapy: Patient Spontanous Breathing and Patient connected to face mask oxygen  Post-op Assessment: Report given to PACU RN and Post -op Vital signs reviewed and stable  Post vital signs: Reviewed and stable  Complications: No apparent anesthesia complications

## 2011-12-16 NOTE — Interval H&P Note (Signed)
History and Physical Interval Note:  12/16/2011 10:42 AM  [name]  has presented today for surgery, with the diagnosis of Osteoarthritis of the Right Hip  The various methods of treatment have been discussed with the patient and family. After consideration of risks, benefits and other options for treatment, the patient has consented to  Procedure(s) (LRB): RIGHT TOTAL HIP ARTHROPLASTY ANTERIOR APPROACH (Right) as a surgical intervention .  The patients' history has been reviewed, patient examined, no change in status, stable for surgery.  I have reviewed the patients' chart and labs.  Questions were answered to the patient's satisfaction.     [name]

## 2011-12-17 LAB — BASIC METABOLIC PANEL
CO2: 27 mEq/L (ref 19–32)
Chloride: 96 mEq/L (ref 96–112)
Glucose, Bld: 144 mg/dL — ABNORMAL HIGH (ref 70–99)
Potassium: 4.1 mEq/L (ref 3.5–5.1)
Sodium: 132 mEq/L — ABNORMAL LOW (ref 135–145)

## 2011-12-17 LAB — CBC
Hemoglobin: 10.5 g/dL — ABNORMAL LOW (ref 12.0–15.0)
RBC: 3.39 MIL/uL — ABNORMAL LOW (ref 3.87–5.11)
WBC: 14.2 10*3/uL — ABNORMAL HIGH (ref 4.0–10.5)

## 2011-12-17 NOTE — Evaluation (Signed)
Physical Therapy Evaluation Patient Details Name: [name] MRN: [id number] DOB: 1922-09-27 Today's Date: 12/17/2011 Time: 4098-1191 PT Time Calculation (min): 34 min  PT Assessment / Plan / Recommendation Clinical Impression  Pt  is s/p R direct anterior THA who plans to go to SNF. Pt will benefit from PT to improve in functional mobility , ROM, strength to DC to SNF.    PT Assessment  Patient needs continued PT services    Follow Up Recommendations  Skilled nursing facility    Barriers to Discharge Decreased caregiver support      lEquipment Recommendations  Defer to next venue    Recommendations for Other Services OT consult   Frequency 7X/week    Precautions / Restrictions Precautions Precautions: None Restrictions Weight Bearing Restrictions: No   Pertinent Vitals/Pain Pt reports Pain at 4/10. W/ activity.  After medication.      Mobility  Bed Mobility Bed Mobility: Supine to Sit Supine to Sit: 3: Mod assist;With rails;HOB elevated Details for Bed Mobility Assistance: VC for   turning in bed to facilitate getting to edge Transfers Transfers: Sit to Stand;Stand to Sit Sit to Stand: From elevated surface;1: +2 Total assist;From bed Sit to Stand: Patient Percentage: 70% Stand to Sit: 4: Min assist;With upper extremity assist;To chair/3-in-1 Details for Transfer Assistance: VC for UE and RLE position for stand /sit  Ambulation/Gait Ambulation/Gait Assistance: 1: +2 Total assist Ambulation/Gait: Patient Percentage: 80% Ambulation Distance (Feet): 6 Feet Assistive device: Rolling walker Ambulation/Gait Assistance Details: VC for sequence and for position inside RW. Gait Pattern: Step-to pattern;Decreased stance time - right General Gait Details: Pt c/o feeling dizzy and requested to sit down.    Exercises Total Joint Exercises Ankle Circles/Pumps: AROM;Both;10 reps;Supine Quad Sets: AROM;10 reps;Supine Heel Slides: AAROM;Right;10 reps;Supine   PT Diagnosis:  Difficulty walking;Acute pain  PT Problem List: Decreased strength;Decreased range of motion;Decreased activity tolerance;Decreased mobility;Decreased knowledge of use of DME;Pain PT Treatment Interventions: DME instruction;Gait training;Functional mobility training;Therapeutic activities;Therapeutic exercise;Patient/family education   PT Goals Acute Rehab PT Goals PT Goal Formulation: With patient Time For Goal Achievement: 12/24/11 Potential to Achieve Goals: Good Pt will go Supine/Side to Sit: with supervision;with HOB 0 degrees PT Goal: Supine/Side to Sit - Progress: Goal set today Pt will go Sit to Supine/Side: with supervision;with HOB 0 degrees PT Goal: Sit to Supine/Side - Progress: Goal set today Pt will go Sit to Stand: with supervision PT Goal: Sit to Stand - Progress: Goal set today Pt will go Stand to Sit: with supervision PT Goal: Stand to Sit - Progress: Goal set today Pt will Ambulate: 51 - 150 feet;with supervision;with rolling walker PT Goal: Ambulate - Progress: Goal set today Pt will Perform Home Exercise Program: with supervision, verbal cues required/provided PT Goal: Perform Home Exercise Program - Progress: Goal set today  Visit Information  Last PT Received On: 12/17/11 Assistance Needed: +2    Subjective Data  Subjective: I will try to get up Patient Stated Goal: i will go to rehab   Prior Functioning  Home Living Lives With: Spouse Type of Home: House Home Layout: One level Home Adaptive Equipment: Straight cane Prior Function Level of Independence: Independent Able to Take Stairs?: Yes Driving: Yes Vocation: Retired Musician: No difficulties (uses HA's)    Cognition  Overall Cognitive Status: Appears within functional limits for tasks assessed/performed Arousal/Alertness: Awake/alert Orientation Level: Appears intact for tasks assessed Behavior During Session: [facility] for tasks performed    Extremity/Trunk Assessment Right  Lower Extremity Assessment RLE  ROM/Strength/Tone: Deficits RLE ROM/Strength/Tone Deficits: Pt required min assist to move RLE to EOB, VC for technique to get  to edge RLE Sensation: WFL - Light Touch Left Lower Extremity Assessment LLE ROM/Strength/Tone: WFL for tasks assessed LLE Sensation: WFL - Light Touch   Balance    End of Session PT - End of Session Activity Tolerance: Treatment limited secondary to medical complications (Comment);Patient limited by fatigue Patient left: in chair;with call bell/phone within reach Nurse Communication: Mobility status   Rada Hay 12/17/2011, 2:18 PM  [phone]

## 2011-12-17 NOTE — Progress Notes (Signed)
Subjective: 1 Day Post-Op Procedure(s) (LRB): TOTAL HIP ARTHROPLASTY ANTERIOR APPROACH (Right)   Patient reports pain as mild, pain well controlled. No events throughout the night.   Objective:   VITALS:   Filed Vitals:   12/17/11 1032  BP: 113/66  Pulse: 67  Temp: 97.7 F (36.5 C)  Resp: 16    Neurovascular intact Dorsiflexion/Plantar flexion intact Incision: dressing C/D/I No cellulitis present Compartment soft  LABS  Basename 12/17/11 0630  HGB 10.5*  HCT 30.9*  WBC 14.2*  PLT 253     Basename 12/17/11 0630  NA 132*  K 4.1  BUN 12  CREATININE 0.48*  GLUCOSE 144*     Assessment/Plan: 1 Day Post-Op Procedure(s) (LRB): TOTAL HIP ARTHROPLASTY ANTERIOR APPROACH (Right)   HV drain d/c'ed Foley cath d/c'ed Advance diet Up with therapy D/C IV fluids Discharge to SNF Friday if continues on current course.   [name]   PAC  12/17/2011, 10:36 AM

## 2011-12-17 NOTE — Progress Notes (Signed)
Clinical Social Work Department BRIEF PSYCHOSOCIAL ASSESSMENT 12/17/2011  Patient:  CHERELL, COLVIN     Account Number:  [id number]     Admit date:  12/16/2011  Clinical Social Worker:  Candie Chroman  Date/Time:  12/17/2011 11:17 AM  Referred by:  Physician  Date Referred:  12/17/2011 Referred for  SNF Placement   Other Referral:   Interview type:  Patient Other interview type:    PSYCHOSOCIAL DATA Living Status:  HUSBAND Admitted from facility:   Level of care:   Primary support name:  Catalina Pizza Primary support relationship to patient:  SPOUSE Degree of support available:   supportive    CURRENT CONCERNS Current Concerns  Post-Acute Placement   Other Concerns:    SOCIAL WORK ASSESSMENT / PLAN Pt is an 78 yr old female living at home prior to hospitalization. Met with pt to assist with d/c planning. ST SNF is needed on d/c. Pt would like to go to [facility] for rehab. Search has been initiated and bed offers will be provided as received.   Assessment/plan status:  Psychosocial Support/Ongoing Assessment of Needs Other assessment/ plan:   Information/referral to community resources:    PATIENT'S/FAMILY'S RESPONSE TO PLAN OF CARE: Pt would like to have ST Rehab  before returning home.     Cori Razor  LCSW [phone]

## 2011-12-17 NOTE — Progress Notes (Signed)
Utilization review completed.  

## 2011-12-17 NOTE — Evaluation (Signed)
Occupational Therapy Evaluation Patient Details Name: [name] MRN: [id number] DOB: Apr 10, 1923 Today's Date: 12/17/2011 Time: 4098-1191 OT Time Calculation (min): 35 min  OT Assessment / Plan / Recommendation Clinical Impression  Pt is recovering from a R THA (anterior approach) who has limited assist at home and is dependent in LB ADL.  She is progressing well in mobility.  Will follow acutely.  Recommend SNF upon d/c.    OT Assessment  Patient needs continued OT Services    Follow Up Recommendations  Skilled nursing facility    Barriers to Discharge Decreased caregiver support    Equipment Recommendations  Defer to next venue    Recommendations for Other Services    Frequency  Min 1X/week    Precautions / Restrictions Precautions Precautions: Fall Restrictions Weight Bearing Restrictions: No   Pertinent Vitals/Pain     ADL  Eating/Feeding: Performed;Independent Where Assessed - Eating/Feeding: Bed level Grooming: Performed;Teeth care;Set up Where Assessed - Grooming: Supine, head of bed up Upper Body Bathing: Simulated;Set up Where Assessed - Upper Body Bathing: Unsupported sitting Lower Body Bathing: Simulated;Moderate assistance Where Assessed - Lower Body Bathing: Unsupported sitting;Supported sit to stand Upper Body Dressing: Performed;Set up Where Assessed - Upper Body Dressing: Unsupported sitting Lower Body Dressing: Performed;Moderate assistance Where Assessed - Lower Body Dressing: Sopported sit to stand;Unsupported sitting Toilet Transfer: Simulated;Min guard Equipment Used: Gait belt Transfers/Ambulation Related to ADLs: Min assist with second person for safety due to dizziness. ADL Comments: Pt is nearly able to access her R foot for ADL.  Will not be able to rely on her husband to assist.    OT Diagnosis: Generalized weakness  OT Problem List: Impaired balance (sitting and/or standing);Decreased knowledge of use of DME or AE;Decreased strength OT  Treatment Interventions: Self-care/ADL training;DME and/or AE instruction;Patient/family education   OT Goals Acute Rehab OT Goals OT Goal Formulation: With patient Time For Goal Achievement: 12/24/11 Potential to Achieve Goals: Good ADL Goals Pt Will Perform Grooming: with supervision;Standing at sink ADL Goal: Grooming - Progress: Goal set today Pt Will Perform Lower Body Bathing: Sit to stand from bed;with supervision;with adaptive equipment ADL Goal: Lower Body Bathing - Progress: Goal set today Pt Will Perform Lower Body Dressing: with supervision;Sit to stand from bed;with adaptive equipment ADL Goal: Lower Body Dressing - Progress: Goal set today Pt Will Transfer to Toilet: with DME;Ambulation;with supervision ADL Goal: Toilet Transfer - Progress: Goal set today Pt Will Perform Toileting - Clothing Manipulation: with supervision;Standing ADL Goal: Toileting - Clothing Manipulation - Progress: Goal set today  Visit Information  Last OT Received On: 12/17/11 Assistance Needed: +2 PT/OT Co-Evaluation/Treatment: Yes    Subjective Data  Subjective: "I was so nauseous this morning." Patient Stated Goal: SNF for ST rehab   Prior Functioning  Home Living Lives With: Spouse Available Help at Discharge: Skilled Nursing Facility Type of Home: House Home Layout: One level Home Adaptive Equipment: Straight cane Prior Function Level of Independence: Independent Able to Take Stairs?: Yes Driving: Yes Vocation: Retired Musician: No difficulties Dominant Hand: Right    Cognition  Overall Cognitive Status: Appears within functional limits for tasks assessed/performed Arousal/Alertness: Awake/alert Orientation Level: Appears intact for tasks assessed Behavior During Session: [facility] for tasks performed    Extremity/Trunk Assessment Right Upper Extremity Assessment RUE ROM/Strength/Tone: Within functional levels Left Upper Extremity Assessment LUE  ROM/Strength/Tone: Within functional levels Right Lower Extremity Assessment RLE ROM/Strength/Tone: Deficits RLE ROM/Strength/Tone Deficits: Pt required min assist to move RLE to EOB, VC for technique to  get  to edge RLE Sensation: [facility] - Light Touch Left Lower Extremity Assessment LLE ROM/Strength/Tone: WFL for tasks assessed LLE Sensation: WFL - Light Touch   Mobility Bed Mobility Bed Mobility: Sit to Supine Supine to Sit: 3: Mod assist;With rails;HOB elevated Sit to Supine: 4: Min assist Details for Bed Mobility Assistance: Min A to place RLE onto bed. pt lifted  L leg w/ R. Transfers Sit to Stand: 4: Min assist;With armrests;From chair/3-in-1;With upper extremity assist Stand to Sit: 4: Min assist;To bed;With upper extremity assist Details for Transfer Assistance: VC for pushing from armrests and to reach to bed, VC to step RLE forward prior to sitting down.   Exercise   Balance    End of Session OT - End of Session Activity Tolerance: Patient tolerated treatment well Patient left: in bed;with call bell/phone within reach;with family/visitor present   Evern Bio 12/17/2011, 3:00 PM [phone]

## 2011-12-17 NOTE — Progress Notes (Signed)
Clinical Social Work Department CLINICAL SOCIAL WORK PLACEMENT NOTE 12/17/2011  Patient:  [name], [name]  Account Number:  [id number] Admit date:  12/16/2011  Clinical Social Worker:  Cori Razor, LCSW  Date/time:  12/17/2011 11:27 AM  Clinical Social Work is seeking post-discharge placement for this patient at the following level of care:   SKILLED NURSING   (*CSW will update this form in Epic as items are completed)   12/17/2011  Patient/family provided with [facility] Department of Clinical Social Work's list of facilities offering this level of care within the geographic area requested by the patient (or if unable, by the patient's family).  12/17/2011  Patient/family informed of their freedom to choose among providers that offer the needed level of care, that participate in Medicare, Medicaid or managed care program needed by the patient, have an available bed and are willing to accept the patient.  12/17/2011  Patient/family informed of [facility]' ownership interest in [facility], as well as of the fact that they are under no obligation to receive care at this facility.  PASARR submitted to EDS on 12/17/2011 PASARR number received from EDS on   FL2 transmitted to all facilities in geographic area requested by pt/family on  12/17/2011 FL2 transmitted to all facilities within larger geographic area on   Patient informed that his/her managed care company has contracts with or will negotiate with  certain facilities, including the following:     Patient/family informed of bed offers received:   Patient chooses bed at  Physician recommends and patient chooses bed at    Patient to be transferred to  on   Patient to be transferred to facility by   The following physician request were entered in Epic:   Additional Comments:  Cori Razor LCSW [phone]

## 2011-12-17 NOTE — Progress Notes (Signed)
Physical Therapy Treatment Patient Details Name: [name] MRN: [id number] DOB: 1922-10-06 Today's Date: 12/17/2011 Time: 1310-1330 PT Time Calculation (min): 20 min  PT Assessment / Plan / Recommendation Comments on Treatment Session  Pt tolerated a short walk into hallway this PM. min c/o pain.  able to advance  RLE w/out difficulty. requires frequent redirection for staying on task.    Follow Up Recommendations  Skilled nursing facility    Barriers to Discharge Decreased caregiver support      Equipment Recommendations  Defer to next venue    Recommendations for Other Services OT consult  Frequency 7X/week   Plan Discharge plan remains appropriate;Frequency remains appropriate    Precautions / Restrictions Precautions Precautions: None Restrictions Weight Bearing Restrictions: No   Pertinent Vitals/Pain 2/10 R hip/thigh. No c/o dizziness.    Mobility  Bed Mobility Bed Mobility: Sit to Supine  Sit to Supine: 4: Min assist Details for Bed Mobility Assistance: Min A to place RLE onto bed. pt lifted  L leg  Along w / R leg. Transfers Transfers: Sit to Stand;Stand to Sit Sit to Stand: 4: Min assist;With armrests;From chair/3-in-1;With upper extremity assist  Stand to Sit: 4: Min assist;To bed;With upper extremity assist Details for Transfer Assistance: VC for pushing from armrests and to reach to bed, VC to step RLE forward prior to sitting down. Ambulation/Gait Ambulation/Gait Assistance: 1: +2 Total assist Ambulation/Gait: Patient Percentage: 80% Ambulation Distance (Feet): 20 Feet Assistive device: Rolling walker Ambulation/Gait Assistance Details: +2 for safety/precaution. VC for posture and sequence. frequent VC to stay on task. Gait Pattern: Step-to pattern General Gait Details: Pt c/o feeling dizzy and requested to sit down.    Exercises    PT Diagnosis: Difficulty walking;Acute pain  PT Problem List: Decreased strength;Decreased range of motion;Decreased  activity tolerance;Decreased mobility;Decreased knowledge of use of DME;Pain PT Treatment Interventions: DME instruction;Gait training;Functional mobility training;Therapeutic activities;Therapeutic exercise;Patient/family education   PT Goals Acute Rehab PT Goals PT Goal Formulation: With patient Time For Goal Achievement: 12/24/11 Potential to Achieve Goals: Good Pt will go Supine/Side to Sit: with supervision;with HOB 0 degrees PT Goal: Supine/Side to Sit - Progress: Goal set today Pt will go Sit to Supine/Side: with supervision;with HOB 0 degrees PT Goal: Sit to Supine/Side - Progress: Progressing toward goal Pt will go Sit to Stand: with supervision PT Goal: Sit to Stand - Progress: Progressing toward goal Pt will go Stand to Sit: with supervision PT Goal: Stand to Sit - Progress: Progressing toward goal Pt will Ambulate: 51 - 150 feet;with supervision;with rolling walker PT Goal: Ambulate - Progress: Progressing toward goal Pt will Perform Home Exercise Program: with supervision, verbal cues required/provided PT Goal: Perform Home Exercise Program - Progress: Goal set today  Visit Information  Last PT Received On: 12/17/11 Assistance Needed: +2 PT/OT Co-Evaluation/Treatment: Yes    Subjective Data  Subjective: I felt nauseated. I don't know if I can walk Patient Stated Goal: i will go to rehab   Cognition  Overall Cognitive Status: Appears within functional limits for tasks assessed/performed Arousal/Alertness: Awake/alert Orientation Level: Appears intact for tasks assessed Behavior During Session: [facility] for tasks performed    Balance     End of Session PT - End of Session Activity Tolerance: Patient tolerated treatment well Patient left: in bed;with call bell/phone within reach;with family/visitor present Nurse Communication: Mobility status    Rada Hay 12/17/2011, 2:27 PM [phone]

## 2011-12-18 LAB — BASIC METABOLIC PANEL
BUN: 12 mg/dL (ref 6–23)
CO2: 28 mEq/L (ref 19–32)
Calcium: 8.7 mg/dL (ref 8.4–10.5)
Chloride: 97 mEq/L (ref 96–112)
Creatinine, Ser: 0.56 mg/dL (ref 0.50–1.10)
GFR calc Af Amer: >90 mL/min (ref >90)
GFR calc non Af Amer: 82 mL/min — ABNORMAL LOW (ref >90)
Glucose, Bld: 128 mg/dL — ABNORMAL HIGH (ref 70–99)
Potassium: 3.8 mEq/L (ref 3.5–5.1)
Sodium: 133 mEq/L — ABNORMAL LOW (ref 135–145)

## 2011-12-18 LAB — CBC
Hemoglobin: 10 g/dL — ABNORMAL LOW (ref 12.0–15.0)
MCH: 31.9 pg (ref 26.0–34.0)
MCV: 91.7 fL (ref 78.0–100.0)
RBC: 3.13 MIL/uL — ABNORMAL LOW (ref 3.87–5.11)
WBC: 17.2 10*3/uL — ABNORMAL HIGH (ref 4.0–10.5)

## 2011-12-18 NOTE — Progress Notes (Signed)
Pt complains of not having a BM since Monday even with miralax and colace daily. Pt given a Dulcalax laxative tonight. Will continue to monitor.

## 2011-12-18 NOTE — Progress Notes (Signed)
Physical Therapy Treatment Patient Details Name: [name] MRN: [id number] DOB: 1923-06-24 Today's Date: 12/18/2011 Time: 1135-1150 PT Time Calculation (min): 15 min  PT Assessment / Plan / Recommendation Comments on Treatment Session  Assisted pt OOB to amb to BR.  Pt required increased time and increased VC's for safety.  Pt progressing slowly and plans to D/C to SNF.    Follow Up Recommendations  Skilled nursing facility    Barriers to Discharge        Equipment Recommendations  Defer to next venue    Recommendations for Other Services    Frequency 7X/week   Plan Discharge plan remains appropriate    Precautions / Restrictions Precautions Precaution Comments: Direct Anterior THR   Pertinent Vitals/Pain C/o 4/10, ICE pack applied     Mobility  Bed Mobility Bed Mobility: Supine to Sit Supine to Sit: 3: Mod assist Details for Bed Mobility Assistance: increased time and 25% VC's on proper technique Transfers Transfers: Sit to Stand;Stand to Sit Sit to Stand: 4: Min assist;From elevated surface;From bed Stand to Sit: 4: Min assist;With armrests;To toilet Details for Transfer Assistance: 25% VC's on safety and backward gait sequencing Ambulation/Gait Ambulation/Gait Assistance: 3: Mod assist Ambulation Distance (Feet): 12 Feet Assistive device: Rolling walker Ambulation/Gait Assistance Details: increased time and 75% VC's on proper tech, foot placement and walker to self placement Gait Pattern: Step-to pattern;Decreased stance time - right;Narrow base of support;Trunk flexed Stairs: No Wheelchair Mobility Wheelchair Mobility: No     PT Goals                                                          Progressing slowly    Visit Information  Last PT Received On: 12/18/11 Assistance Needed: +1    Subjective Data  Subjective: I have to use the bathroom Patient Stated Goal: n/a   Cognition    fair   Balance   poor  End of Session PT - End of Session Equipment  Utilized During Treatment: Gait belt Activity Tolerance: Patient limited by fatigue Patient left: in chair;with call bell/phone within reach   Felecia Shelling  PTA [facility]  Acute  Rehab Pager     [phone]

## 2011-12-18 NOTE — Progress Notes (Signed)
Physical Therapy Treatment Patient Details Name: ITXEL WICKARD MRN: [id number] DOB: Oct 18, 1922 Today's Date: 12/18/2011 Time: 1215-1230 PT Time Calculation (min): 15 min  PT Assessment / Plan / Recommendation Comments on Treatment Session  Spouse and son present and observed.  Amb pt in hallway.    Follow Up Recommendations  Skilled nursing facility    Barriers to Discharge        Equipment Recommendations  Defer to next venue    Recommendations for Other Services    Frequency 7X/week   Plan Discharge plan remains appropriate    Precautions / Restrictions Precautions Precaution Comments: Direct Anterior THR   Pertinent Vitals/Pain C/o "soreness"    Mobility  Bed Mobility Bed Mobility: Supine to Sit Supine to Sit: 3: Mod assist Details for Bed Mobility Assistance: Pt OOB in recliner Transfers Transfers: Sit to Stand;Stand to Sit Sit to Stand: 4: Min assist;From chair/3-in-1 Stand to Sit: 4: Min assist;To chair/3-in-1 Details for Transfer Assistance: increased time and 25% VC's on hand placement and safety with truns using RW throughout Ambulation/Gait Ambulation/Gait Assistance: 4: Min Environmental consultant (Feet): 22 Feet Assistive device: Rolling walker Ambulation/Gait Assistance Details: increased time and 75% VC's on proper tech, foot placement and walker to self placement  Gait Pattern: Step-to pattern;Decreased stance time - right;Trunk flexed;Narrow base of support Gait velocity: No c/o dizzyness, just c/o weakness and fatigue Stairs: No Wheelchair Mobility Wheelchair Mobility: No         PT Goals                                                         Progressing slowly    Visit Information  Last PT Received On: 12/18/11 Assistance Needed: +1    Subjective Data  Subjective: I feel better Patient Stated Goal: go to Rehab   Cognition    fair   Balance   poor  End of Session PT - End of Session Equipment Utilized During Treatment: Gait  belt Activity Tolerance: Patient limited by fatigue Patient left: in chair;with call bell/phone within reach;with family/visitor present   Felecia Shelling  PTA [facility]  Acute  Rehab Pager     [phone]

## 2011-12-18 NOTE — Care Management Note (Unsigned)
    Page 1 of 2   12/18/2011     6:39:22 PM   CARE MANAGEMENT NOTE 12/18/2011  Patient:  [name], [name]   Account Number:  [id number]  Date Initiated:  12/16/2011  Documentation initiated by:  Colleen Can  Subjective/Objective Assessment:   dx rt anterior hip replacemnt on day of admission     Action/Plan:   pt/ot eval pending. CM will follow  when eval completed  Plans are for SNF rehab   Anticipated DC Date:  12/19/2011   Anticipated DC Plan:  SKILLED NURSING FACILITY  In-house referral  Clinical Social Worker      DC Planning Services  NA      [facility] Choice  NA   Choice offered to / List presented to:  NA   DME arranged  NA      DME agency  NA     HH arranged  NA      HH agency  NA   Status of service:  Completed, signed off Medicare Important Message given?   (If response is "NO", the following Medicare IM given date fields will be blank) Date Medicare IM given:   Date Additional Medicare IM given:    Discharge Disposition:    Per UR Regulation:  Reviewed for med. necessity/level of care/duration of stay  If discussed at Long Length of Stay Meetings, dates discussed:    Comments:

## 2011-12-18 NOTE — Progress Notes (Signed)
Subjective: 2 Days Post-Op Procedure(s) (LRB): TOTAL HIP ARTHROPLASTY ANTERIOR APPROACH (Right)   Patient reports pain as mild, pain well controlled. No pain when not moving the leg and well controlled otherwise. No events throughout the night.   Objective:   VITALS:   Filed Vitals:   12/18/11 0845  BP: 102/68  Pulse: 53  Temp: 97.9 F (36.6 C)  Resp: 16    Neurovascular intact Dorsiflexion/Plantar flexion intact Incision: dressing C/D/I No cellulitis present Compartment soft  LABS  Basename 12/18/11 0457 12/17/11 0630  HGB 10.0* 10.5*  HCT 28.7* 30.9*  WBC 17.2* 14.2*  PLT 258 253     Basename 12/18/11 0457 12/17/11 0630  NA 133* 132*  K 3.8 4.1  BUN 12 12  CREATININE 0.56 0.48*  GLUCOSE 128* 144*     Assessment/Plan: 2 Days Post-Op Procedure(s) (LRB): TOTAL HIP ARTHROPLASTY ANTERIOR APPROACH (Right)   Up with therapy Discharge to SNF tomorrow if she continues on her current course.   Anastasio Auerbach Joakim Huesman   PAC  12/18/2011, 8:47 AM

## 2011-12-19 ENCOUNTER — Encounter (HOSPITAL_COMMUNITY): Payer: Self-pay | Admitting: Orthopedic Surgery

## 2011-12-19 LAB — URINALYSIS, ROUTINE W REFLEX MICROSCOPIC
Glucose, UA: NEGATIVE mg/dL
Hgb urine dipstick: NEGATIVE
Ketones, ur: NEGATIVE mg/dL
Leukocytes, UA: NEGATIVE
Protein, ur: NEGATIVE mg/dL
pH: 7 (ref 5.0–8.0)

## 2011-12-19 MED ORDER — METHOCARBAMOL 500 MG PO TABS: 500.0000 mg | ORAL_TABLET | Freq: Four times a day (QID) | ORAL | Status: AC | PRN

## 2011-12-19 MED ORDER — FLEET ENEMA 7-19 GM/118ML RE ENEM
1.0000 | ENEMA | Freq: Every day | RECTAL | Status: DC | PRN
  Administered 2011-12-19: 1 via RECTAL
  Filled 2011-12-19: qty 1

## 2011-12-19 MED ORDER — ASPIRIN EC 325 MG PO TBEC: 325.0000 mg | DELAYED_RELEASE_TABLET | Freq: Two times a day (BID) | ORAL | Status: AC

## 2011-12-19 MED ORDER — FERROUS SULFATE 325 (65 FE) MG PO TABS: 325.0000 mg | ORAL_TABLET | Freq: Three times a day (TID) | ORAL | Status: DC

## 2011-12-19 MED ORDER — POLYETHYLENE GLYCOL 3350 17 G PO PACK: 17.0000 g | PACK | Freq: Two times a day (BID) | ORAL | Status: AC

## 2011-12-19 MED ORDER — MAGNESIUM CITRATE PO SOLN
1.0000 | Freq: Once | ORAL | Status: AC
  Administered 2011-12-19: 1 via ORAL
  Filled 2011-12-19: qty 296

## 2011-12-19 MED ORDER — HYDROCODONE-ACETAMINOPHEN 7.5-325 MG PO TABS: 1.0000 | ORAL_TABLET | ORAL | Status: AC | PRN

## 2011-12-19 MED ORDER — DIPHENHYDRAMINE HCL 25 MG PO CAPS: 25.0000 mg | ORAL_CAPSULE | Freq: Four times a day (QID) | ORAL | Status: DC | PRN

## 2011-12-19 NOTE — Progress Notes (Signed)
Subjective: 3 Days Post-Op Procedure(s) (LRB): TOTAL HIP ARTHROPLASTY ANTERIOR APPROACH (Right)   Patient reports pain as mild, pain well controlled. No events throughout the night. Issues with having a BM and requesting mag citrate this morning, otherwise no events.  Objective:   VITALS:   Filed Vitals:   12/19/11 0620  BP: 170/78  Pulse: 76  Temp: 98.2 F (36.8 C)  Resp: 16    Neurovascular intact Dorsiflexion/Plantar flexion intact Incision: dressing C/D/I No cellulitis present Compartment soft  LABS  Basename 12/18/11 0457 12/17/11 0630  HGB 10.0* 10.5*  HCT 28.7* 30.9*  WBC 17.2* 14.2*  PLT 258 253     Basename 12/18/11 0457 12/17/11 0630  NA 133* 132*  K 3.8 4.1  BUN 12 12  CREATININE 0.56 0.48*  GLUCOSE 128* 144*     Assessment/Plan: 3 Days Post-Op Procedure(s) (LRB): TOTAL HIP ARTHROPLASTY ANTERIOR APPROACH (Right)  UA this morning for increase of WBCs.  Up with therapy Discharge to SNF Follow up in 2 weeks at [facility].  Follow-up Information    Follow up with OLIN,Irina Okelly D in 2 weeks.   Contact information:   [facility] [address] [place] [address] [phone]          Anastasio Auerbach. Nickey Canedo   PAC  12/19/2011, 7:49 AM

## 2011-12-19 NOTE — Discharge Summary (Signed)
Physician Discharge Summary  Patient ID: [name] MRN: [id number] DOB/AGE: 1923-01-04 78 y.o.  Admit date: 12/16/2011 Discharge date: 12/19/2011  Procedures:  Procedure(s) (LRB): TOTAL HIP ARTHROPLASTY ANTERIOR APPROACH (Right)  Attending Physician:  Dr. Durene Romans   Admission Diagnoses: End-stage osteoarthritis of the right hip  Discharge Diagnoses:  Principal Problem:  *S/P right THA, AA  . Endometriosis   . GERD 02/12/2009  . DIVERTICULOSIS-COLON 02/12/2009  . SPINAL STENOSIS, LUMBAR 12/09/2008  . HYPERLIPIDEMIA 03/02/2007  . DEPRESSION 01/19/2007  . HYPERTENSION 01/19/2007  . ALLERGIC RHINITIS 01/19/2007  . ESOPHAGEAL STRICTURE 01/19/2007  . OSTEOPOROSIS 01/19/2007     HPI: This is an 78 year old lady with a history of end-stage osteoarthritis of her right hip which has failed conservative measures. Due to continued pain and discomfort in the hip, she is now scheduled for total hip arthroplasty by anterior approach. The surgery, risks, benefits, and aftercare were discussed in detail with the patient, questions invited and answered. Note that her medical doctor is Dr. [name]. She does plan on going home after surgery though she may need to go to a nursing facility based on how she is doing. She is a candidate for tranexamic acid and dexamethasone and will receive both at surgery, and she was not given her home medications  today.  PCP: Judie Petit, MD, MD   Discharged Condition: good  Hospital Course:  Patient underwent the above stated procedure on 12/16/2011. Patient tolerated the procedure well and brought to the recovery room in good condition and subsequently to the floor.  POD #1 BP: 113/66 ; Pulse: 67 ; Temp: 97.7 F (36.5 C) ; Resp: 16  [name] foley was removed, as well as the hemovac drain removed. IV was changed to a saline lock. Patient reports pain as mild, pain well controlled. No events throughout the night. Neurovascular intact,  dorsiflexion/plantar flexion intact, incision: dressing C/D/I, no cellulitis present and compartment soft.   LABS  Basename  12/17/11 0630   HGB  10.5  HCT  30.9   POD #2  BP: 102/68 ; Pulse: 53 ; Temp: 97.9 F (36.6 C) ; Resp: 16 Patient reports pain as mild, pain well controlled. No pain when not moving the leg and well controlled otherwise. No events throughout the night.  Neurovascular intact, dorsiflexion/plantar flexion intact, incision: dressing C/D/I, no cellulitis present and compartment soft.   LABS  Basename  12/18/11 0457   HGB  10.0  HCT  28.7   POD #3  BP: 170/78 ; Pulse: 76 ; Temp: 98.2 F (36.8 C) ; Resp: 16  Patient reports pain as mild, pain well controlled. No events throughout the night. Issues with having a BM and requesting mag citrate this morning, otherwise no events. Ready to be discharged to SNF. Neurovascular intact, dorsiflexion/plantar flexion intact, incision: dressing C/D/I, no cellulitis present and compartment soft.   LABS   No new labs  Discharge Exam: General appearance: alert, cooperative and no distress Extremities: Homans sign is negative, no sign of DVT, no edema, redness or tenderness in the calves or thighs and no ulcers, gangrene or trophic changes  Disposition:  SNF with follow up in 2 weeks  Follow-up Information    Follow up with OLIN,Wana Mount D in 2 weeks.   Contact information:   [facility] [address] [place] [address] [phone]          Discharge Orders    Future Orders Please Complete By Expires   Diet -  low sodium heart healthy      Call MD / Call 911      Comments:   If you experience chest pain or shortness of breath, CALL 911 and be transported to the hospital emergency room.  If you develope a fever above 101 F, pus (white drainage) or increased drainage or redness at the wound, or calf pain, call your surgeon's office.   Discharge instructions      Comments:    Maintain surgical dressing for 8 days, then replace with gauze and tape. Keep the area dry and clean until follow up. Follow up in 2 weeks at [facility]. Call with any questions or concerns.     Constipation Prevention      Comments:   Drink plenty of fluids.  Prune juice may be helpful.  You may use a stool softener, such as Colace (over the counter) 100 mg twice a day.  Use MiraLax (over the counter) for constipation as needed.   Increase activity slowly as tolerated      Driving restrictions      Comments:   No driving for 4 weeks   Change dressing      Comments:   Maintain surgical dressing for 8 days, then replace with 4x4 guaze and tape. Keep the area dry and clean.   TED hose      Comments:   Use stockings (TED hose) for 2 weeks on both leg(s).  You may remove them at night for sleeping.      Current Discharge Medication List    START taking these medications   Details  aspirin EC 325 MG tablet Take 1 tablet (325 mg total) by mouth 2 (two) times daily. X 4 weeks Qty: 60 tablet, Refills: 0    diphenhydrAMINE (BENADRYL) 25 mg capsule Take 1 capsule (25 mg total) by mouth every 6 (six) hours as needed for itching, allergies or sleep.    ferrous sulfate 325 (65 FE) MG tablet Take 1 tablet (325 mg total) by mouth 3 (three) times daily after meals.    HYDROcodone-acetaminophen (NORCO) 7.5-325 MG per tablet Take 1-2 tablets by mouth every 4 (four) hours as needed for pain. Qty: 120 tablet, Refills: 0    methocarbamol (ROBAXIN) 500 MG tablet Take 1 tablet (500 mg total) by mouth every 6 (six) hours as needed (muscle spasms). Qty: 50 tablet, Refills: 0    polyethylene glycol (MIRALAX / GLYCOLAX) packet Take 17 g by mouth 2 (two) times daily. Qty: 14 each, Refills: 0      CONTINUE these medications which have NOT CHANGED   Details  cholecalciferol (VITAMIN D) 1000 UNITS tablet Take 1,000 Units by mouth daily.     diltiazem (CARDIZEM CD) 180 MG 24 hr capsule Take  180 mg by mouth daily with breakfast.    docusate-casanthranol (PERICOLACE) 100-30 MG per capsule Take 1 capsule by mouth daily as needed. For constipation    fish oil-omega-3 fatty acids 1000 MG capsule Take 1 g by mouth daily.     glycerin adult (GLYCERIN ADULT) 2 G SUPP Place 1 suppository rectally once as needed. For constipation    Menthol-Methyl Salicylate (ICY HOT) 10-30 % STCK Apply topically 2 (two) times daily as needed. For pain    Multiple Vitamin (MULTIVITAMIN) tablet Take 1 tablet by mouth daily.      olmesartan-hydrochlorothiazide (BENICAR HCT) 20-12.5 MG per tablet Take 1 tablet by mouth daily with breakfast.    omeprazole (PRILOSEC) 20 MG capsule Take 20 mg  by mouth daily with breakfast.    simvastatin (ZOCOR) 20 MG tablet Take 1 tablet (20 mg total) by mouth at bedtime. Qty: 30 tablet, Refills: 5   Associated Diagnoses: Hyperlipidemia      STOP taking these medications     acetaminophen (TYLENOL) 500 MG tablet Comments:  Reason for Stopping:       aspirin 81 MG tablet Comments:  Reason for Stopping:       HYDROcodone-acetaminophen (NORCO) 5-325 MG per tablet Comments:  Reason for Stopping:            Signed: Anastasio Auerbach. Shulem Mader   PAC  12/19/2011, 8:19 AM

## 2011-12-19 NOTE — Progress Notes (Signed)
Physical Therapy Treatment Patient Details Name: [name] MRN: [id number] DOB: [birth year] Today's Date: 12/19/2011 Time: 9147-8295 PT Time Calculation (min): 25 min  PT Assessment / Plan / Recommendation Comments on Treatment Session  spouse and son present.  Assisted pt OOB to amb then to recliner to perform TE's.  Pt plans to D/C to SNF today however having issues with nausea/vomiting this am and inability to have a BM.    Follow Up Recommendations  Skilled nursing facility    Barriers to Discharge        Equipment Recommendations  Defer to next venue    Recommendations for Other Services    Frequency 7X/week   Plan Discharge plan remains appropriate    Precautions / Restrictions  NONE Direct Anterior THR WBAT   Pertinent Vitals/Pain C/o 6/10 R hip pain with TE's, ICE applied    Mobility  Bed Mobility Bed Mobility: Supine to Sit Supine to Sit: 4: Min assist Details for Bed Mobility Assistance: increased time and mod assist to support r LE off bed Transfers Transfers: Sit to Stand;Stand to Sit Sit to Stand: 4: Min assist;From bed Stand to Sit: 4: Min assist;To chair/3-in-1 Details for Transfer Assistance: increased time and 25% VC's on hand placement and safety with truns using RW throughout  Ambulation/Gait Ambulation/Gait Assistance: 4: Min assist Ambulation Distance (Feet): 35 Feet Assistive device: Rolling walker Ambulation/Gait Assistance Details: increased time and 25% VC's on safety esp with turns Gait Pattern: Step-to pattern;Decreased stance time - right;Trunk flexed Gait velocity: c/o mild nausea Stairs: No Wheelchair Mobility Wheelchair Mobility: No    Exercises Total Joint Exercises Ankle Circles/Pumps: AROM;Both;10 reps;Supine Quad Sets: AROM;Both;10 reps;Supine Gluteal Sets: AROM;Both;10 reps;Supine Heel Slides: AAROM;Right;10 reps;Supine Hip ABduction/ADduction: AAROM;Right;10 reps;Supine      Visit Information  Last PT Received On:  12/19/11 Assistance Needed: +1    Subjective Data  Subjective: I feel nausea Patient Stated Goal: n/a   Cognition    fair   Balance   fair  End of Session PT - End of Session Equipment Utilized During Treatment: Gait belt Activity Tolerance: Patient limited by fatigue;Other (comment) (c/o nausea) Patient left: in chair;with call bell/phone within reach;with family/visitor present    Felecia Shelling  PTA [facility]  Acute  Rehab Pager     [phone]

## 2011-12-19 NOTE — Progress Notes (Signed)
Pt for d/c to SNF-[name] today. IV d/c'd. Dressing(Aquacel) to R hip C,D,I. Pt has voided & U/A sent as ordered- PA aware of  negative result. Mag Citrate given to pt this am with no result except gas. Zofran given for n/v episode. Pt requested for Fleets enema--with good result. PT worked with pt this pm. Awaiting for transport at this time as called by SW. Family at bedside & aware of pt's d/c to SNF. D/C instructions discussed with pt & family with verbalized understanding.

## 2011-12-19 NOTE — Progress Notes (Signed)
Discharge summary sent to payer through [employer]  

## 2011-12-22 NOTE — Progress Notes (Signed)
Clinical Social Work Department CLINICAL SOCIAL WORK PLACEMENT NOTE 12/22/2011  Patient:  [name], [name]  Account Number:  [id number] Admit date:  12/16/2011  Clinical Social Worker:  Cori Razor, LCSW  Date/time:  12/17/2011 11:27 AM  Clinical Social Work is seeking post-discharge placement for this patient at the following level of care:   SKILLED NURSING   (*CSW will update this form in Epic as items are completed)   12/17/2011  Patient/family provided with [facility] Department of Clinical Social Work's list of facilities offering this level of care within the geographic area requested by the patient (or if unable, by the patient's family).  12/17/2011  Patient/family informed of their freedom to choose among providers that offer the needed level of care, that participate in Medicare, Medicaid or managed care program needed by the patient, have an available bed and are willing to accept the patient.  12/17/2011  Patient/family informed of [facility]' ownership interest in [facility], as well as of the fact that they are under no obligation to receive care at this facility.  PASARR submitted to [employer] on 12/17/2011 PASARR number received from [employer] on   FL2 transmitted to all facilities in geographic area requested by pt/family on  12/17/2011 FL2 transmitted to all facilities within larger geographic area on   Patient informed that his/her managed care company has contracts with or will negotiate with  certain facilities, including the following:     Patient/family informed of bed offers received:  12/17/2011 Patient chooses bed at [facility] Physician recommends and patient chooses bed at    Patient to be transferred to [facility] on  12/19/2011 Patient to be transferred to facility by [facility]  The following physician request were entered in Epic:   Additional Comments:  Cori Razor LCSW [phone]

## 2012-01-15 ENCOUNTER — Other Ambulatory Visit: Payer: Self-pay | Admitting: Internal Medicine

## 2012-02-02 ENCOUNTER — Ambulatory Visit: Payer: Medicare Other | Attending: Orthopedic Surgery

## 2012-02-02 DIAGNOSIS — IMO0001 Reserved for inherently not codable concepts without codable children: Secondary | ICD-10-CM | POA: Insufficient documentation

## 2012-02-02 DIAGNOSIS — R293 Abnormal posture: Secondary | ICD-10-CM | POA: Insufficient documentation

## 2012-02-02 DIAGNOSIS — M6281 Muscle weakness (generalized): Secondary | ICD-10-CM | POA: Insufficient documentation

## 2012-02-02 DIAGNOSIS — M25559 Pain in unspecified hip: Secondary | ICD-10-CM | POA: Insufficient documentation

## 2012-02-03 ENCOUNTER — Ambulatory Visit: Payer: Medicare Other

## 2012-02-05 ENCOUNTER — Ambulatory Visit: Payer: Medicare Other | Admitting: Physical Therapy

## 2012-02-11 ENCOUNTER — Ambulatory Visit: Payer: Medicare Other | Admitting: Physical Therapy

## 2012-02-12 ENCOUNTER — Ambulatory Visit: Payer: Medicare Other | Admitting: Physical Therapy

## 2012-02-13 ENCOUNTER — Encounter: Payer: Medicare Other | Admitting: Physical Therapy

## 2012-02-16 ENCOUNTER — Ambulatory Visit: Payer: Medicare Other | Admitting: Physical Therapy

## 2012-02-18 ENCOUNTER — Other Ambulatory Visit: Payer: Self-pay | Admitting: Internal Medicine

## 2012-02-19 ENCOUNTER — Ambulatory Visit: Payer: Medicare Other | Attending: Orthopedic Surgery

## 2012-02-19 DIAGNOSIS — M6281 Muscle weakness (generalized): Secondary | ICD-10-CM | POA: Insufficient documentation

## 2012-02-19 DIAGNOSIS — IMO0001 Reserved for inherently not codable concepts without codable children: Secondary | ICD-10-CM | POA: Insufficient documentation

## 2012-02-19 DIAGNOSIS — M25559 Pain in unspecified hip: Secondary | ICD-10-CM | POA: Insufficient documentation

## 2012-02-19 DIAGNOSIS — R269 Unspecified abnormalities of gait and mobility: Secondary | ICD-10-CM | POA: Insufficient documentation

## 2012-02-25 ENCOUNTER — Ambulatory Visit: Payer: Medicare Other

## 2012-02-27 ENCOUNTER — Telehealth: Payer: Self-pay | Admitting: Family Medicine

## 2012-02-27 MED ORDER — OLMESARTAN MEDOXOMIL-HCTZ 20-12.5 MG PO TABS: 1.0000 | ORAL_TABLET | Freq: Every day | ORAL | Status: DC

## 2012-02-27 NOTE — Telephone Encounter (Signed)
rx sent in electronically, samples up front for p/u, pt aware 

## 2012-02-27 NOTE — Telephone Encounter (Signed)
Patient called. She let her Benicar run out, and Dr. Cato Mulligan had given her samples. Samples are now out, and she would like [place] to send in a new Rx for her Benicar to her mail order pharmacy. This will take at least 10 days, and she is totally out, so please call and let her know if she can pick up samples. Or, she wants to know if she can just not take the med til it gets here, as she is already on Cardizem. Please advise. Thank you. Patient uses [facility] mail order pharmacy

## 2012-03-01 ENCOUNTER — Ambulatory Visit: Payer: Medicare Other | Admitting: Physical Therapy

## 2012-03-03 ENCOUNTER — Ambulatory Visit: Payer: Medicare Other | Admitting: Physical Therapy

## 2012-03-09 ENCOUNTER — Other Ambulatory Visit: Payer: Self-pay | Admitting: Internal Medicine

## 2012-03-09 ENCOUNTER — Telehealth: Payer: Self-pay | Admitting: *Deleted

## 2012-03-09 NOTE — Telephone Encounter (Signed)
Pt called c/o slight vaginal itching, pt asked if okay to use OTC itch relief cream. I told pt okay if itching should persist then OV best. Annual scheduled on 03/31/12 @12 :00

## 2012-03-15 ENCOUNTER — Other Ambulatory Visit: Payer: Self-pay | Admitting: Internal Medicine

## 2012-03-20 ENCOUNTER — Other Ambulatory Visit: Payer: Self-pay | Admitting: Internal Medicine

## 2012-03-26 ENCOUNTER — Ambulatory Visit: Payer: Medicare Other | Admitting: Internal Medicine

## 2012-03-29 ENCOUNTER — Encounter: Payer: Self-pay | Admitting: Internal Medicine

## 2012-03-29 ENCOUNTER — Ambulatory Visit (INDEPENDENT_AMBULATORY_CARE_PROVIDER_SITE_OTHER): Payer: Medicare Other | Admitting: Internal Medicine

## 2012-03-29 VITALS — BP 132/82 | HR 72 | Temp 97.6°F | Wt 147.0 lb

## 2012-03-29 DIAGNOSIS — I10 Essential (primary) hypertension: Secondary | ICD-10-CM

## 2012-03-29 DIAGNOSIS — K219 Gastro-esophageal reflux disease without esophagitis: Secondary | ICD-10-CM

## 2012-03-29 DIAGNOSIS — D649 Anemia, unspecified: Secondary | ICD-10-CM

## 2012-03-29 DIAGNOSIS — E785 Hyperlipidemia, unspecified: Secondary | ICD-10-CM

## 2012-03-29 DIAGNOSIS — M169 Osteoarthritis of hip, unspecified: Secondary | ICD-10-CM

## 2012-03-29 LAB — CBC WITH DIFFERENTIAL/PLATELET
Eosinophils Relative: 6.7 % — ABNORMAL HIGH (ref 0.0–5.0)
Lymphocytes Relative: 32.2 % (ref 12.0–46.0)
Monocytes Absolute: 0.4 10*3/uL (ref 0.1–1.0)
Monocytes Relative: 6.6 % (ref 3.0–12.0)
Neutrophils Relative %: 53.9 % (ref 43.0–77.0)
Platelets: 207 10*3/uL (ref 150.0–400.0)
RBC: 4.94 Mil/uL (ref 3.87–5.11)
WBC: 5.6 10*3/uL (ref 4.5–10.5)

## 2012-03-29 LAB — LIPID PANEL
Cholesterol: 138 mg/dL (ref 0–200)
HDL: 51.4 mg/dL (ref >39.00)
LDL Cholesterol: 67 mg/dL (ref 0–99)
Triglycerides: 96 mg/dL (ref 0.0–149.0)

## 2012-03-29 NOTE — Progress Notes (Signed)
Subjective:    Patient ID: [name], female    DOB: April 03, 1923, 78 y.o.   MRN: [id number]  HPI  htn- tolerating meds OA hip- s/p THA  GERD- s/p PPI  Past Medical History  Diagnosis Date  . Allergy   . GERD (gastroesophageal reflux disease)   . Depression   . Hypertension   . Osteoporosis   . Arthritis   . Hyperlipidemia   . Diverticulosis   . Spinal stenosis   . Ovarian cyst   . Endometriosis   . Hearing loss 12-08-11    bil  . S/P right THA, AA 12/16/2011    History   Social History  . Marital Status: Married    Spouse Name: N/A    Number of Children: N/A  . Years of Education: N/A   Occupational History  . Not on file.   Social History Main Topics  . Smoking status: Former Games developer  . Smokeless tobacco: Not on file  . Alcohol Use: Yes     1 wine / beer nightly  . Drug Use: No  . Sexually Active: No   Other Topics Concern  . Not on file   Social History Narrative  . No narrative on file    Past Surgical History  Procedure Date  . Hernia repair   . Oophorectomy     bilateral  . Hysteroscopy   . Dilation and curettage of uterus   . Tibia fracture surgery     trauma  . Cataract extraction, bilateral 12-08-11    bil  . Steriod injection 12/09/11    to spine for rt calf pain and spasms  . Total hip arthroplasty 12/16/2011    Procedure: TOTAL HIP ARTHROPLASTY ANTERIOR APPROACH;  Surgeon: [name], MD;  Location: [facility];  Service: Orthopedics;  Laterality: Right;    Family History  Problem Relation Age of Onset  . Cancer Father     esophogeal  . Stroke Sister   . Diabetes Daughter   . Cancer Paternal Aunt     stomach  . Breast cancer Maternal Grandmother     Allergies  Allergen Reactions  . Codeine Other (See Comments)    fainted  . Mercury   . Sulfonamide Derivatives     REACTION: rash    Current Outpatient Prescriptions on File Prior to Visit  Medication Sig Dispense Refill  . cholecalciferol (VITAMIN D) 1000 UNITS tablet  Take 1,000 Units by mouth daily.       Marland Kitchen diltiazem (CARDIZEM CD) 180 MG 24 hr capsule TAKE 1 CAPSULE EVERY DAY  20 capsule  0  . fish oil-omega-3 fatty acids 1000 MG capsule Take 1 g by mouth daily.       Marland Kitchen glycerin adult (GLYCERIN ADULT) 2 G SUPP Place 1 suppository rectally once as needed. For constipation      . Multiple Vitamin (MULTIVITAMIN) tablet Take 1 tablet by mouth daily.        Marland Kitchen olmesartan-hydrochlorothiazide (BENICAR HCT) 20-12.5 MG per tablet Take 1 tablet by mouth daily with breakfast.  90 tablet  3  . omeprazole (PRILOSEC) 20 MG capsule TAKE ONE CAPSULE BY MOUTH EVERY DAY  90 capsule  1  . simvastatin (ZOCOR) 20 MG tablet TAKE 1 TABLET BY MOUTH AT BEDTIME  30 tablet  5  . DISCONTD: omeprazole (PRILOSEC) 20 MG capsule Take 20 mg by mouth daily with breakfast.         patient denies chest pain, shortness of breath, orthopnea. Denies  lower extremity edema, abdominal pain, change in appetite, change in bowel movements. Patient denies rashes, musculoskeletal complaints. No other specific complaints in a complete review of systems.   BP 132/82  Pulse 72  Temp 97.6 F (36.4 C) (Oral)  Wt 147 lb (66.679 kg)  Well-developed well-nourished female in no acute distress. HEENT exam atraumatic, normocephalic, extraocular muscles are intact. Neck is supple. No jugular venous distention no thyromegaly. Chest clear to auscultation without increased work of breathing. Cardiac exam S1 and S2 are regular. Abdominal exam active bowel sounds, soft, nontender.   Review of Systems     Objective:   Physical Exam        Assessment & Plan:

## 2012-03-29 NOTE — Assessment & Plan Note (Signed)
Adequate control-= continue meds 

## 2012-03-29 NOTE — Assessment & Plan Note (Signed)
Well controlled Continue PPI 

## 2012-03-29 NOTE — Assessment & Plan Note (Signed)
adqequate control Continue same meds

## 2012-03-31 ENCOUNTER — Encounter: Payer: Self-pay | Admitting: Obstetrics and Gynecology

## 2012-03-31 ENCOUNTER — Ambulatory Visit (INDEPENDENT_AMBULATORY_CARE_PROVIDER_SITE_OTHER): Payer: Medicare Other | Admitting: Obstetrics and Gynecology

## 2012-03-31 VITALS — BP 120/76 | Ht 63.0 in | Wt 145.0 lb

## 2012-03-31 DIAGNOSIS — I10 Essential (primary) hypertension: Secondary | ICD-10-CM | POA: Insufficient documentation

## 2012-03-31 DIAGNOSIS — N952 Postmenopausal atrophic vaginitis: Secondary | ICD-10-CM

## 2012-03-31 DIAGNOSIS — B373 Candidiasis of vulva and vagina: Secondary | ICD-10-CM

## 2012-03-31 DIAGNOSIS — N898 Other specified noninflammatory disorders of vagina: Secondary | ICD-10-CM

## 2012-03-31 DIAGNOSIS — L293 Anogenital pruritus, unspecified: Secondary | ICD-10-CM

## 2012-03-31 DIAGNOSIS — K219 Gastro-esophageal reflux disease without esophagitis: Secondary | ICD-10-CM | POA: Insufficient documentation

## 2012-03-31 DIAGNOSIS — E785 Hyperlipidemia, unspecified: Secondary | ICD-10-CM | POA: Insufficient documentation

## 2012-03-31 DIAGNOSIS — M4800 Spinal stenosis, site unspecified: Secondary | ICD-10-CM | POA: Insufficient documentation

## 2012-03-31 DIAGNOSIS — M810 Age-related osteoporosis without current pathological fracture: Secondary | ICD-10-CM

## 2012-03-31 DIAGNOSIS — N393 Stress incontinence (female) (male): Secondary | ICD-10-CM

## 2012-03-31 LAB — WET PREP FOR TRICH, YEAST, CLUE: Clue Cells Wet Prep HPF POC: NONE SEEN

## 2012-03-31 MED ORDER — TERCONAZOLE 0.8 % VA CREA: 1.0000 | TOPICAL_CREAM | Freq: Every day | VAGINAL | Status: AC

## 2012-03-31 NOTE — Progress Notes (Signed)
Patient came to see me today for further followup. She is having vaginal itching and discharge. She has a history of yeast infections. She tried Monistat 3 before she saw me  with minimal results. She is having no vaginal bleeding. She is having no pelvic pain. Her last mammogram was May, 2012. She has not had abnormal Pap smears ever. Her last Pap was 2011. She had been on Fosamax for greater than 5 years for osteoporosis. Her last bone density was last year. She still has significant bone loss but her PCP put her on drug holiday last year. She has had no fractures. She does have atrophic vaginitis with vaginal dryness. She declined medication. She has urinary frequency with nocturia x2. She also will have some leakage of urine with coughing laughing and sneezing. She does Kegel exercises occasionally. She is having no dysuria or hematuria.  ROS: 12 system review done.  pertinent positives above. Other positives include spinal stenosis, right hip replacement this year for osteoarthritis,hypertension, gerd,and hearing loss.  Physical examination:Kim Julian Reil present. HEENT within normal limits. Neck: Thyroid not large. No masses. Supraclavicular nodes: not enlarged. Breasts: Examined in both sitting and lying  position. No skin changes and no masses. Abdomen: Soft no guarding rebound or masses or hernia. Pelvic: External: Within normal limits. BUS: Within normal limits. Vaginal:within normal limits. Poor estrogen effect. No evidence of cystocele rectocele or enterocele. Typical yeast like discharge with negative wet prep. Cervix: clean. Uterus: Normal size and shape. Adnexa: No masses. Rectovaginal exam: Confirmatory and negative.  Assessment: #1. Yeast vaginitis #2. Osteoporosis #3. Atrophic vaginitis #4. Detrussor instability #5. Stress incontinence, urinary  Plan: Mammogram. Terconazole 3 cream. Start oral refresh 3 times a week. Bone density next year.The new Pap smear guidelines were discussed with  the patient. No pap done. Continue Kegel exercises.

## 2012-03-31 NOTE — Patient Instructions (Addendum)
Schedule yearly mammogram. Start oral rephresh three times a week.

## 2012-04-02 ENCOUNTER — Other Ambulatory Visit: Payer: Self-pay | Admitting: Internal Medicine

## 2012-04-26 ENCOUNTER — Encounter: Payer: Self-pay | Admitting: Internal Medicine

## 2012-04-26 ENCOUNTER — Ambulatory Visit (INDEPENDENT_AMBULATORY_CARE_PROVIDER_SITE_OTHER): Payer: Medicare Other | Admitting: Internal Medicine

## 2012-04-26 VITALS — BP 100/60 | Temp 97.9°F | Wt 148.0 lb

## 2012-04-26 DIAGNOSIS — D1720 Benign lipomatous neoplasm of skin and subcutaneous tissue of unspecified limb: Secondary | ICD-10-CM

## 2012-04-26 DIAGNOSIS — D1779 Benign lipomatous neoplasm of other sites: Secondary | ICD-10-CM

## 2012-04-26 DIAGNOSIS — I10 Essential (primary) hypertension: Secondary | ICD-10-CM

## 2012-04-26 NOTE — Progress Notes (Signed)
Subjective:    Patient ID: [name], female    DOB: October 01, 1922, 78 y.o.   MRN: [id number]  HPI  78 year old patient who has a history of treated hypertension. She is concerned today about a possible nodule involving the left elbow region. She apparently had her friend last year who presented with multiple subcutaneous nodules and died.  She feels this has been present for about 5 weeks and has been unchanged.. No constitutional complaints  Past Medical History  Diagnosis Date  . Allergy   . GERD (gastroesophageal reflux disease)   . Arthritis   . Hyperlipidemia   . Spinal stenosis   . Ovarian cyst   . Hearing loss 12-08-11    bil  . S/P right THA, AA 12/16/2011  . Depression   . Endometriosis   . Hypertension   . Osteoporosis     History   Social History  . Marital Status: Married    Spouse Name: N/A    Number of Children: N/A  . Years of Education: N/A   Occupational History  . Not on file.   Social History Main Topics  . Smoking status: Former Games developer  . Smokeless tobacco: Not on file  . Alcohol Use: 0.5 oz/week    1 drink(s) per week  . Drug Use: No  . Sexually Active: No   Other Topics Concern  . Not on file   Social History Narrative  . No narrative on file    Past Surgical History  Procedure Date  . Hernia repair   . Oophorectomy     bilateral  . Hysteroscopy   . Dilation and curettage of uterus   . Tibia fracture surgery     trauma  . Cataract extraction, bilateral 12-08-11    bil  . Steriod injection 12/09/11    to spine for rt calf pain and spasms  . Total hip arthroplasty 12/16/2011    Procedure: TOTAL HIP ARTHROPLASTY ANTERIOR APPROACH;  Surgeon: [name], MD;  Location: [facility];  Service: Orthopedics;  Laterality: Right;    Family History  Problem Relation Age of Onset  . Cancer Father     esophogeal  . Stroke Sister   . Diabetes Daughter   . Cancer Paternal Aunt     stomach  . Breast cancer Maternal Grandmother     Age 72     Allergies  Allergen Reactions  . Codeine Other (See Comments)    fainted  . Mercury   . Sulfonamide Derivatives     REACTION: rash    Current Outpatient Prescriptions on File Prior to Visit  Medication Sig Dispense Refill  . cholecalciferol (VITAMIN D) 1000 UNITS tablet Take 1,000 Units by mouth daily.       Marland Kitchen diltiazem (CARDIZEM CD) 180 MG 24 hr capsule TAKE 1 CAPSULE EVERY DAY  30 capsule  5  . fish oil-omega-3 fatty acids 1000 MG capsule Take 1 g by mouth daily.       Marland Kitchen glycerin adult (GLYCERIN ADULT) 2 G SUPP Place 1 suppository rectally once as needed. For constipation      . Multiple Vitamin (MULTIVITAMIN) tablet Take 1 tablet by mouth daily.        Marland Kitchen olmesartan-hydrochlorothiazide (BENICAR HCT) 20-12.5 MG per tablet Take 1 tablet by mouth daily with breakfast.  90 tablet  3  . omeprazole (PRILOSEC) 20 MG capsule TAKE ONE CAPSULE BY MOUTH EVERY DAY  90 capsule  1  . simvastatin (ZOCOR) 20 MG tablet TAKE  1 TABLET BY MOUTH AT BEDTIME  30 tablet  5    BP 100/60  Temp 97.9 F (36.6 C) (Oral)  Wt 148 lb (67.132 kg)       Review of Systems  Constitutional: Negative.   HENT: Negative for hearing loss, congestion, sore throat, rhinorrhea, dental problem, sinus pressure and tinnitus.   Eyes: Negative for pain, discharge and visual disturbance.  Respiratory: Negative for cough and shortness of breath.   Cardiovascular: Negative for chest pain, palpitations and leg swelling.  Gastrointestinal: Negative for nausea, vomiting, abdominal pain, diarrhea, constipation, blood in stool and abdominal distention.  Genitourinary: Negative for dysuria, urgency, frequency, hematuria, flank pain, vaginal bleeding, vaginal discharge, difficulty urinating, vaginal pain and pelvic pain.  Musculoskeletal: Negative for joint swelling, arthralgias and gait problem.  Skin: Positive for wound. Negative for rash.  Neurological: Negative for dizziness, syncope, speech difficulty, weakness, numbness  and headaches.  Hematological: Negative for adenopathy.  Psychiatric/Behavioral: Negative for behavioral problems, dysphoric mood and agitation. The patient is not nervous/anxious.        Objective:   Physical Exam  Constitutional: She appears well-developed and well-nourished. No distress.       Blood pressure 130/70 on repeat  Skin:       Patient has some soft tissue fullness about the medial elbow area. Did not appear to be an epitrochlear node. The left axilla was unremarkable          Assessment & Plan:    Fullness left elbow region.   Doubt this represents any serious pathology there does appear to be some fullness in this area but this is not very prominent. May have a small lipoma or subcutaneous cyst. Patient was reassured  Hypertension stable   Follow up with PCP as scheduled. Patient was instructed to have her PCP check this area next ROV.  She is also asked to notify the office if there is any change or enlargement

## 2012-04-26 NOTE — Patient Instructions (Signed)
Please check your blood pressure on a regular basis.  If it is consistently greater than 150/90, please make an office appointment.  Call or return to clinic prn if these symptoms worsen or fail to improve as anticipated.   

## 2012-05-13 ENCOUNTER — Encounter: Payer: Self-pay | Admitting: Obstetrics and Gynecology

## 2012-06-28 ENCOUNTER — Telehealth: Payer: Self-pay | Admitting: Internal Medicine

## 2012-06-28 NOTE — Telephone Encounter (Signed)
Patient states she was given amples  Benicar 40/25mg  by Dr. Kirtland Bouchard a couple of weeks ago.  She states her prescription is 20/12.5mg .  Dr. Kirtland Bouchard told her to take the 40mg /25mg  "every other day".  She is confused about the dosage.  Should she take the samples every other day or cut them in half?  Reviewed Epic. Information on samples not listed in H&P. Medication list shows 20/12.5mg  daily. Advised patient I would forward request to Dr. Cato Mulligan for review. Please contact concerning medication dosage.

## 2012-06-28 NOTE — Telephone Encounter (Signed)
Patient is concerned about her Benicar.  She states she was given samples of Benicar 40/25mg  to take every other day by Dr. Kirtland Bouchard.  She wants to know from Dr. Cato Mulligan about her dosage.

## 2012-06-28 NOTE — Telephone Encounter (Signed)
Patient Call Back @ 2:34 pm - No reason given for phone call. Left message on answering machine for patient to call back if she still needs to speak with triage nurse or someone at the office.

## 2012-06-28 NOTE — Telephone Encounter (Signed)
Continue Benicar dose of 20/12.5;  okay to call in new prescription if needed; okay to take one half of the 40/25 mg dose if patient has a pill cutter

## 2012-06-29 NOTE — Telephone Encounter (Signed)
Pt would like to know what Dr Cato Mulligan wants her to do.  Ok to leave message on machine per pt if she is not home.  Please advise.

## 2012-06-29 NOTE — Telephone Encounter (Signed)
Pls call pt on cell phone [phone].  Land line is second:  [phone].  Pt says OK to leave message.

## 2012-06-29 NOTE — Telephone Encounter (Signed)
Left message for pt to call back  °

## 2012-06-30 NOTE — Telephone Encounter (Signed)
Please advise 

## 2012-06-30 NOTE — Telephone Encounter (Signed)
Left detailed message on pts machine, also told pt to call back if she needed a refill

## 2012-06-30 NOTE — Telephone Encounter (Signed)
Agree with dr k

## 2012-07-01 NOTE — Telephone Encounter (Signed)
I had already called pt about this but I called and left message on pts cell phone

## 2012-07-01 NOTE — Telephone Encounter (Signed)
Take as instructed in med list 20/12.5 (1/2 of the pill she was given)

## 2012-09-16 ENCOUNTER — Other Ambulatory Visit: Payer: Self-pay | Admitting: *Deleted

## 2012-09-16 MED ORDER — SIMVASTATIN 20 MG PO TABS: 20.0000 mg | ORAL_TABLET | Freq: Every day | ORAL | Status: DC

## 2012-09-23 ENCOUNTER — Ambulatory Visit (INDEPENDENT_AMBULATORY_CARE_PROVIDER_SITE_OTHER): Payer: Self-pay | Admitting: Family Medicine

## 2012-09-23 VITALS — BP 126/74 | HR 64 | Temp 98.3°F

## 2012-09-23 DIAGNOSIS — J329 Chronic sinusitis, unspecified: Secondary | ICD-10-CM

## 2012-09-23 MED ORDER — DOXYCYCLINE HYCLATE 100 MG PO TABS: 100.0000 mg | ORAL_TABLET | Freq: Two times a day (BID) | ORAL | Status: DC

## 2012-09-23 NOTE — Patient Instructions (Addendum)
INSTRUCTIONS FOR UPPER RESPIRATORY INFECTION:  -plenty of rest and fluids  -nasal saline wash Lloyd Huger Med) 2-3 times daily (use prepackaged nasal saline or bottled/distilled water if making your own)   -can use tylenol or ibuprofen as directed for aches and sorethroat  -in the winter time, using a humidifier at night is helpful (please follow cleaning instructions)  -if you are taking a cough medication - use only as directed, may also try a teaspoon of honey to coat the throat and throat lozenges  -for sore throat, salt water gargles can help  -follow up if you have fevers, facial pain, tooth pain, difficulty breathing or are worsening or not getting better in 5-7 days

## 2012-09-23 NOTE — Progress Notes (Signed)
Chief Complaint  Patient presents with  . Cough    x10 days  . Nasal Congestion    HPI:  Acute visit for URI: -started: about 10 days ago -symptoms:nasal congestion, sinus pressure, sore throat, cough, laryngitis - but now seems to be improving -denies:fever, SOB, NVD, tooth pain, strep or mono exposure -has tried: salt water, asa, delsum -sick contacts:  None known -hx of sinus infections  ROS: See pertinent positives and negatives per HPI.  Past Medical History  Diagnosis Date  . Allergy   . GERD (gastroesophageal reflux disease)   . Arthritis   . Hyperlipidemia   . Spinal stenosis   . Ovarian cyst   . Hearing loss 12-08-11    bil  . S/P right THA, AA 12/16/2011  . Depression   . Endometriosis   . Hypertension   . Osteoporosis     Family History  Problem Relation Age of Onset  . Cancer Father     esophogeal  . Stroke Sister   . Diabetes Daughter   . Cancer Paternal Aunt     stomach  . Breast cancer Maternal Grandmother     Age 16    History   Social History  . Marital Status: Married    Spouse Name: N/A    Number of Children: N/A  . Years of Education: N/A   Social History Main Topics  . Smoking status: Former Games developer  . Smokeless tobacco: Not on file  . Alcohol Use: 0.5 oz/week    1 drink(s) per week  . Drug Use: No  . Sexually Active: No   Other Topics Concern  . Not on file   Social History Narrative  . No narrative on file    Current outpatient prescriptions:aspirin 81 MG tablet, Take 81 mg by mouth daily., Disp: , Rfl: ;  cholecalciferol (VITAMIN D) 1000 UNITS tablet, Take 1,000 Units by mouth daily. , Disp: , Rfl: ;  diltiazem (CARDIZEM CD) 180 MG 24 hr capsule, TAKE 1 CAPSULE EVERY DAY, Disp: 30 capsule, Rfl: 5;  fish oil-omega-3 fatty acids 1000 MG capsule, Take 1 g by mouth daily. , Disp: , Rfl:  glycerin adult (GLYCERIN ADULT) 2 G SUPP, Place 1 suppository rectally once as needed. For constipation, Disp: , Rfl: ;  Multiple Vitamin  (MULTIVITAMIN) tablet, Take 1 tablet by mouth daily.  , Disp: , Rfl: ;  olmesartan-hydrochlorothiazide (BENICAR HCT) 20-12.5 MG per tablet, Take 1 tablet by mouth daily with breakfast., Disp: 90 tablet, Rfl: 3;  omeprazole (PRILOSEC) 20 MG capsule, TAKE ONE CAPSULE BY MOUTH EVERY DAY, Disp: 90 capsule, Rfl: 1 doxycycline (VIBRA-TABS) 100 MG tablet, Take 1 tablet (100 mg total) by mouth 2 (two) times daily., Disp: 20 tablet, Rfl: 0;  simvastatin (ZOCOR) 20 MG tablet, Take 1 tablet (20 mg total) by mouth daily., Disp: 30 tablet, Rfl: 5  EXAM:  Filed Vitals:   09/23/12 1609  BP: 126/74  Pulse: 64  Temp: 98.3 F (36.8 C)    Body mass index is 0.00 kg/(m^2).  GENERAL: vitals reviewed and listed above, alert, oriented, appears well hydrated and in no acute distress  HEENT: atraumatic, conjunttiva clear, no obvious abnormalities on inspection of external nose and ears, normal appearance of ear canals and TMs, yellow nasal congestion, mild post oropharyngeal erythema with PND, no tonsillar edema or exudate, no sinus TTP  NECK: no obvious masses on inspection  LUNGS: clear to auscultation bilaterally, no wheezes, rales or rhonchi, good air movement  CV: HRRR, no  peripheral edema  MS: moves all extremities without noticeable abnormality  PSYCH: pleasant and cooperative, no obvious depression or anxiety  ASSESSMENT AND PLAN:  Discussed the following assessment and plan:  Sinusitis - Plan: doxycycline (VIBRA-TABS) 100 MG tablet   -advised since improving may be viral and could hold off on abx - she will do so but has rx incase returns or worsens over the weekend - risks discussed. She will shred if does not use. In the meantime will do saline irrigation. -Patient advised to return or notify a doctor immediately if symptoms worsen or persist or new concerns arise.  Patient Instructions  INSTRUCTIONS FOR UPPER RESPIRATORY INFECTION:  -plenty of rest and fluids  -nasal saline wash Lloyd Huger  Med) 2-3 times daily (use prepackaged nasal saline or bottled/distilled water if making your own)   -can use tylenol or ibuprofen as directed for aches and sorethroat  -in the winter time, using a humidifier at night is helpful (please follow cleaning instructions)  -if you are taking a cough medication - use only as directed, may also try a teaspoon of honey to coat the throat and throat lozenges  -for sore throat, salt water gargles can help  -follow up if you have fevers, facial pain, tooth pain, difficulty breathing or are worsening or not getting better in 5-7 days      KIM, Dahlia Client R.

## 2012-09-30 ENCOUNTER — Other Ambulatory Visit: Payer: Self-pay | Admitting: *Deleted

## 2012-09-30 MED ORDER — DILTIAZEM HCL ER COATED BEADS 180 MG PO CP24: ORAL_CAPSULE | ORAL | Status: DC

## 2012-11-29 ENCOUNTER — Telehealth: Payer: Self-pay | Admitting: Internal Medicine

## 2012-11-29 NOTE — Telephone Encounter (Signed)
Call and see how she is doing 

## 2012-11-29 NOTE — Telephone Encounter (Signed)
[facility] Triage Call Report Triage Record Num: [id number] Operator: Claudie Leach Patient Name: [name] Call Date & Time: 11/26/2012 8:54:50PM Patient Phone: [phone] PCP: Valetta Mole. [facility] Patient Gender: Female PCP Fax : [phone] Patient DOB: [birth year] [facility] Name[facility] Reason for Call: Caller: [name]/Patient; PCP: [name] (Adults only); CB#: [phone]; Call regarding Medication Issue; Medication(s): Hydrocodone. States she hurt her right shoulder on 5/6 or 5/7 at physical therapy. States she has take Meloxicam and it has not helped. States she needs something to sleep and when she lays down it hurts worse. Is requesting Hydrocodone. States the pain is an 8 or 9 on the pain scale. States the Tylenol does not relieve the pain for very long. States she has been alternating Ibuprofen 400 mg with either Tylenol 650 mg or Tylenol 500 mg. Informed caller that i can not call for narcotics after hours. Triaged per Shoulder Injury guideline. To see in ED immediately due to unbearable pain. Care advice given. Advised of need to be seen and states her shoulder will be fine when she has the pain medicine. Per standing order will call in Ultram 50 mg to take 1 or 2 q4-6h prn. Dispense 6. No refills. Called in to [facility] at [phone]. Caller made aware. Protocol(s) Used: Shoulder Injury Recommended Outcome per Protocol: See ED Immediately Reason for Outcome: Unbearable pain Care Advice: ~ Apply cloth-covered ice pack or a cool compress to the area while in transit to reduce pain and swelling. ~ IMMEDIATE ACTION 05/

## 2012-11-29 NOTE — Telephone Encounter (Signed)
Pt is taking Tramadol 50 mg rx'd by the Saturday dr and it made her vomit.  She called the pharmacist and the pharmacist told her to cut it half and she did and ended up sleeping all day.  She hurt her shoulder by weight bearing exercises.  She feels better today.  She thinks she just pulled a muscle.  Pt stated if she doesn't feel better in 3-4 days she will f/u with someone unless Dr Cato Mulligan would consider giving her some hydrocodone

## 2012-12-07 ENCOUNTER — Telehealth: Payer: Self-pay | Admitting: Internal Medicine

## 2012-12-07 NOTE — Telephone Encounter (Signed)
Left message on voicemail that samples were ready for p/u, pt will need to get a rx next time

## 2012-12-07 NOTE — Telephone Encounter (Signed)
Patient called stating that she would like to have samples of benicar 20-12.5 mg 1poqd as she is completely out. Please assist.

## 2013-02-28 ENCOUNTER — Telehealth: Payer: Self-pay | Admitting: Internal Medicine

## 2013-02-28 DIAGNOSIS — Z0000 Encounter for general adult medical examination without abnormal findings: Secondary | ICD-10-CM

## 2013-02-28 NOTE — Telephone Encounter (Signed)
Pt is requesting Dr. Cato Mulligan give both she and her spouse a DNR order. Please advise.

## 2013-03-01 NOTE — Telephone Encounter (Signed)
Pt following up on request for DNR. pls  Advise. Pt want call back asap.

## 2013-03-02 NOTE — Telephone Encounter (Signed)
ok 

## 2013-03-13 ENCOUNTER — Other Ambulatory Visit: Payer: Self-pay | Admitting: Internal Medicine

## 2013-03-25 ENCOUNTER — Encounter: Payer: Self-pay | Admitting: Internal Medicine

## 2013-03-25 ENCOUNTER — Ambulatory Visit (INDEPENDENT_AMBULATORY_CARE_PROVIDER_SITE_OTHER): Payer: Medicare Other | Admitting: Internal Medicine

## 2013-03-25 VITALS — BP 152/74 | HR 54 | Temp 97.8°F | Wt 151.0 lb

## 2013-03-25 DIAGNOSIS — K219 Gastro-esophageal reflux disease without esophagitis: Secondary | ICD-10-CM

## 2013-03-25 DIAGNOSIS — Z23 Encounter for immunization: Secondary | ICD-10-CM

## 2013-03-25 DIAGNOSIS — E785 Hyperlipidemia, unspecified: Secondary | ICD-10-CM

## 2013-03-25 DIAGNOSIS — I10 Essential (primary) hypertension: Secondary | ICD-10-CM

## 2013-03-25 LAB — CBC WITH DIFFERENTIAL/PLATELET
Basophils Relative: 0.4 % (ref 0.0–3.0)
Eosinophils Relative: 2.2 % (ref 0.0–5.0)
MCV: 94.7 fl (ref 78.0–100.0)
Monocytes Absolute: 0.9 10*3/uL (ref 0.1–1.0)
Monocytes Relative: 8.2 % (ref 3.0–12.0)
Neutrophils Relative %: 67.3 % (ref 43.0–77.0)
RBC: 4.24 Mil/uL (ref 3.87–5.11)
WBC: 11 10*3/uL — ABNORMAL HIGH (ref 4.5–10.5)

## 2013-03-25 LAB — BASIC METABOLIC PANEL
BUN: 23 mg/dL (ref 6–23)
Chloride: 96 mEq/L (ref 96–112)
Glucose, Bld: 75 mg/dL (ref 70–99)
Potassium: 3.9 mEq/L (ref 3.5–5.1)

## 2013-03-25 LAB — HEPATIC FUNCTION PANEL
ALT: 18 U/L (ref 0–35)
AST: 18 U/L (ref 0–37)
Albumin: 4 g/dL (ref 3.5–5.2)
Total Bilirubin: 0.6 mg/dL (ref 0.3–1.2)

## 2013-03-25 LAB — TSH: TSH: 1.69 u[IU]/mL (ref 0.35–5.50)

## 2013-03-25 LAB — LIPID PANEL
Total CHOL/HDL Ratio: 3
VLDL: 23.6 mg/dL (ref 0.0–40.0)

## 2013-03-25 NOTE — Assessment & Plan Note (Signed)
Check labs today.

## 2013-03-25 NOTE — Progress Notes (Signed)
Discussed DNR form  Lipids- neds f/u  htn- tolerating meds  Past Medical History  Diagnosis Date  . Allergy   . GERD (gastroesophageal reflux disease)   . Arthritis   . Hyperlipidemia   . Spinal stenosis   . Ovarian cyst   . Hearing loss 12-08-11    bil  . S/P right THA, AA 12/16/2011  . Depression   . Endometriosis   . Hypertension   . Osteoporosis     History   Social History  . Marital Status: Married    Spouse Name: N/A    Number of Children: N/A  . Years of Education: N/A   Occupational History  . Not on file.   Social History Main Topics  . Smoking status: Former Games developer  . Smokeless tobacco: Not on file  . Alcohol Use: 0.5 oz/week    1 drink(s) per week  . Drug Use: No  . Sexual Activity: No   Other Topics Concern  . Not on file   Social History Narrative  . No narrative on file    Past Surgical History  Procedure Laterality Date  . Hernia repair    . Oophorectomy      bilateral  . Hysteroscopy    . Dilation and curettage of uterus    . Tibia fracture surgery      trauma  . Cataract extraction, bilateral  12-08-11    bil  . Steriod injection  12/09/11    to spine for rt calf pain and spasms  . Total hip arthroplasty  12/16/2011    Procedure: TOTAL HIP ARTHROPLASTY ANTERIOR APPROACH;  Surgeon: [name], MD;  Location: [facility];  Service: Orthopedics;  Laterality: Right;    Family History  Problem Relation Age of Onset  . Cancer Father     esophogeal  . Stroke Sister   . Diabetes Daughter   . Cancer Paternal Aunt     stomach  . Breast cancer Maternal Grandmother     Age 55    Allergies  Allergen Reactions  . Codeine Other (See Comments)    fainted  . Mercury   . Sulfonamide Derivatives     REACTION: rash    Current Outpatient Prescriptions on File Prior to Visit  Medication Sig Dispense Refill  . aspirin 81 MG tablet Take 81 mg by mouth daily.      . cholecalciferol (VITAMIN D) 1000 UNITS tablet Take 1,000 Units by mouth  daily.       Marland Kitchen diltiazem (CARDIZEM CD) 180 MG 24 hr capsule TAKE 1 CAPSULE EVERY DAY  30 capsule  5  . glycerin adult (GLYCERIN ADULT) 2 G SUPP Place 1 suppository rectally once as needed. For constipation      . Multiple Vitamin (MULTIVITAMIN) tablet Take 1 tablet by mouth daily.        Marland Kitchen olmesartan-hydrochlorothiazide (BENICAR HCT) 20-12.5 MG per tablet Take 1 tablet by mouth daily with breakfast.  90 tablet  3  . simvastatin (ZOCOR) 20 MG tablet TAKE 1 TABLET EVERY DAY  90 tablet  0   No current facility-administered medications on file prior to visit.     patient denies chest pain, shortness of breath, orthopnea. Denies lower extremity edema, abdominal pain, change in appetite, change in bowel movements. Patient denies rashes, musculoskeletal complaints. No other specific complaints in a complete review of systems.   BP 152/74  Pulse 54  Temp(Src) 97.8 F (36.6 C) (Oral)  Wt 151 lb (68.493 kg)  BMI  26.76 kg/m2  Well-developed well-nourished female in no acute distress. HEENT exam atraumatic, normocephalic, extraocular muscles are intact. Neck is supple. No jugular venous distention no thyromegaly. Chest clear to auscultation without increased work of breathing. Cardiac exam S1 and S2 are regular. Abdominal exam active bowel sounds, soft, nontender. Extremities no edema. Neurologic exam she is alert without any motor sensory deficits. Gait is normal.

## 2013-03-25 NOTE — Assessment & Plan Note (Signed)
Well controlled 

## 2013-03-25 NOTE — Assessment & Plan Note (Signed)
Fair control Continue same meds 

## 2013-03-31 ENCOUNTER — Other Ambulatory Visit: Payer: Self-pay | Admitting: Internal Medicine

## 2013-04-05 ENCOUNTER — Other Ambulatory Visit: Payer: Self-pay | Admitting: Internal Medicine

## 2013-04-06 ENCOUNTER — Telehealth: Payer: Self-pay | Admitting: Internal Medicine

## 2013-04-06 MED ORDER — OLMESARTAN MEDOXOMIL-HCTZ 20-12.5 MG PO TABS: ORAL_TABLET | ORAL | Status: DC

## 2013-04-06 NOTE — Telephone Encounter (Signed)
Pt needs new rx benicar 20-12.5 #90 with refills sent [facility]

## 2013-04-06 NOTE — Telephone Encounter (Signed)
Done

## 2013-06-28 ENCOUNTER — Other Ambulatory Visit: Payer: Self-pay | Admitting: Internal Medicine

## 2013-09-05 ENCOUNTER — Encounter: Payer: Self-pay | Admitting: Internal Medicine

## 2013-09-29 ENCOUNTER — Telehealth: Payer: Self-pay | Admitting: Internal Medicine

## 2013-09-29 ENCOUNTER — Other Ambulatory Visit: Payer: Self-pay | Admitting: Internal Medicine

## 2013-09-29 NOTE — Telephone Encounter (Signed)
Could you find out why pt needs to be seen

## 2013-09-29 NOTE — Telephone Encounter (Signed)
Pt states she hasn't been seen in a while and just wondering if she needs to come in for labs/visit.  Last seen 03/25/13

## 2013-09-29 NOTE — Telephone Encounter (Signed)
She can see [name] if she wants to.  Padonda can let her know at visit if she needs any labs

## 2013-09-29 NOTE — Telephone Encounter (Signed)
Pt would like to know if she should come in for appt and labs? Pt prefers afternoon appt and would like to know if padonda ok for appt? Pt just had refill .

## 2013-10-04 NOTE — Telephone Encounter (Signed)
lmom for pt to cb

## 2013-10-24 ENCOUNTER — Encounter: Payer: Self-pay | Admitting: Family Medicine

## 2013-10-24 ENCOUNTER — Ambulatory Visit (INDEPENDENT_AMBULATORY_CARE_PROVIDER_SITE_OTHER): Payer: Medicare Other | Admitting: Family Medicine

## 2013-10-24 VITALS — BP 108/70 | Temp 98.2°F | Wt 147.0 lb

## 2013-10-24 DIAGNOSIS — I10 Essential (primary) hypertension: Secondary | ICD-10-CM

## 2013-10-24 NOTE — Patient Instructions (Signed)
Continue the.ties and one daily  Hold the Benicar tomorrow then restart it on Wednesday by taking one half tablet daily  Check your blood pressure daily at home  See Dr. Leanne Chang in one week for followup

## 2013-10-24 NOTE — Progress Notes (Signed)
Pre visit review using our clinic review tool, if applicable. No additional management support is needed unless otherwise documented below in the visit note. 

## 2013-10-24 NOTE — Progress Notes (Signed)
   Subjective:    Patient ID: [name], female    DOB: 02/24/1923, 80 y.o.   MRN: [id number]  HPI [name] 80-year-old female who comes in today for evaluation of multiple issues  The first issue is the fact that last Tuesday she noticed a sensation of lightheadedness. She does not describe vertigo. The sensation is constant. She has no fever chills change in vision hearing etc......Marland Kitchen bilateral hearing aids,,,,,,,, she takes Benicar 20-12.5 and diltiazem 180 mg daily for hypertension BP today 108/70 at 4:30 in the afternoon.  She also has other questions concerning lab work test for diabetes and other issues. I will refer these back to her primary care physician Dr. Cain Sieve!!!!!!!!!!!!!!!!!!   Review of Systems Review of systems otherwise negative    Objective:   Physical Exam Well-developed well-nourished female no acute distress vital signs stable except for BP 108/70 ENT exam negative       Assessment & Plan:  Sensation of lightheadedness probably related to hypotension plan see orders

## 2013-10-25 ENCOUNTER — Telehealth: Payer: Self-pay | Admitting: Internal Medicine

## 2013-10-25 NOTE — Telephone Encounter (Signed)
I have rescheduled patients appointment from Friday 4/10 to Monday, 4/13.  Pt wants to know should she continue to skip her olmesartan-hydrochlorothiazide (BENICAR HCT) 20-12.5 MG tablets until Wednesday and then resume taking 1/2 pill as directed until she is able to be seen?  Please advise. Pt refused triage.

## 2013-10-26 NOTE — Telephone Encounter (Signed)
Take 1/2 tablet daly

## 2013-10-27 NOTE — Telephone Encounter (Signed)
Pt aware.

## 2013-10-28 ENCOUNTER — Ambulatory Visit: Payer: Medicare Other | Admitting: Internal Medicine

## 2013-10-31 ENCOUNTER — Ambulatory Visit (INDEPENDENT_AMBULATORY_CARE_PROVIDER_SITE_OTHER): Payer: Medicare Other | Admitting: Internal Medicine

## 2013-10-31 ENCOUNTER — Encounter: Payer: Self-pay | Admitting: Internal Medicine

## 2013-10-31 VITALS — BP 134/82 | HR 60 | Temp 97.5°F | Ht 63.0 in | Wt 147.0 lb

## 2013-10-31 DIAGNOSIS — R42 Dizziness and giddiness: Secondary | ICD-10-CM

## 2013-10-31 LAB — CBC WITH DIFFERENTIAL/PLATELET
Basophils Absolute: 0.1 10*3/uL (ref 0.0–0.1)
Basophils Relative: 0.8 % (ref 0.0–3.0)
EOS PCT: 6.2 % — AB (ref 0.0–5.0)
Eosinophils Absolute: 0.5 10*3/uL (ref 0.0–0.7)
HEMATOCRIT: 38 % (ref 36.0–46.0)
Hemoglobin: 12.5 g/dL (ref 12.0–15.0)
LYMPHS ABS: 2.3 10*3/uL (ref 0.7–4.0)
Lymphocytes Relative: 31.2 % (ref 12.0–46.0)
MCHC: 33 g/dL (ref 30.0–36.0)
MCV: 93.3 fl (ref 78.0–100.0)
MONO ABS: 0.7 10*3/uL (ref 0.1–1.0)
Monocytes Relative: 9.5 % (ref 3.0–12.0)
NEUTROS ABS: 3.8 10*3/uL (ref 1.4–7.7)
Neutrophils Relative %: 52.3 % (ref 43.0–77.0)
PLATELETS: 233 10*3/uL (ref 150.0–400.0)
RBC: 4.07 Mil/uL (ref 3.87–5.11)
RDW: 13.4 % (ref 11.5–14.6)
WBC: 7.3 10*3/uL (ref 4.5–10.5)

## 2013-10-31 LAB — BASIC METABOLIC PANEL
BUN: 26 mg/dL — AB (ref 6–23)
CHLORIDE: 98 meq/L (ref 96–112)
CO2: 32 mEq/L (ref 19–32)
Calcium: 9.7 mg/dL (ref 8.4–10.5)
Creatinine, Ser: 0.8 mg/dL (ref 0.4–1.2)
GFR: 73.96 mL/min (ref >60.00)
GLUCOSE: 85 mg/dL (ref 70–99)
POTASSIUM: 3.9 meq/L (ref 3.5–5.1)
Sodium: 137 mEq/L (ref 135–145)

## 2013-10-31 NOTE — Progress Notes (Signed)
Pre visit review using our clinic review tool, if applicable. No additional management support is needed unless otherwise documented below in the visit note. 

## 2013-10-31 NOTE — Progress Notes (Signed)
1 week ago she felt off balance. She really doesn't describe vertigo. She states she is feeling better. She did admit to being light headed. The benicar was cut in 1/2 by Dr. Sherren Mocha.   She is feeling better. She admits to chronic anxiety.   Reviewed meds, reviewed PMH  BP 162/92  Pulse 60  Temp(Src) 97.5 F (36.4 C) (Oral)  Ht 5\' 3"  (1.6 m)  Wt 147 lb (66.679 kg)  BMI 26.05 kg/m2 Chest cta cv reg rate Neuro- alert- Normal gait (minimally broad based).  A/p vague sensation of lightheadedness- improving  Will check labs

## 2013-11-02 ENCOUNTER — Telehealth: Payer: Self-pay | Admitting: Internal Medicine

## 2013-11-02 NOTE — Telephone Encounter (Signed)
Pt following up on phone request. Pt is having terrible lightheadedness and doesn't know what to do.  Pt what like to know what to do. Pt would like someone to explain BUN to her today

## 2013-11-02 NOTE — Telephone Encounter (Signed)
Per Dr Leanne Chang last office note she was not complaining of lightheadedness but she states she is.  She wants to know if she should be referred to a neurologist

## 2013-11-02 NOTE — Telephone Encounter (Signed)
Pt states she received her results in the mail, pt has questions about her BUN states it is high based on the results, pt wants to speak with nurse to explain what the plan of action is to correct the problem.

## 2013-11-04 NOTE — Telephone Encounter (Signed)
Pt does not know what to do concerning lighheadedness. Please advise

## 2013-11-06 NOTE — Telephone Encounter (Signed)
Have her monitor BP at home for 1 week (2 xs daily)-- call with results

## 2013-11-07 NOTE — Telephone Encounter (Signed)
Pt verbalized understanding and had no questions 

## 2013-11-08 ENCOUNTER — Telehealth: Payer: Self-pay | Admitting: Internal Medicine

## 2013-11-08 NOTE — Telephone Encounter (Signed)
Pt would like Dr Judd Gaudier to give her a call

## 2013-11-09 NOTE — Telephone Encounter (Signed)
Pt was wanting to discuss Dr Leanne Chang leaving.  She wanted to talk to him before he left the practice.  She stated she was probably going to leave the practice when Dr Leanne Chang left but she would like to Dr Leanne Chang personally.  Offered another appt but she declined

## 2014-04-04 ENCOUNTER — Other Ambulatory Visit: Payer: Self-pay | Admitting: Internal Medicine

## 2014-04-20 ENCOUNTER — Encounter: Payer: Self-pay | Admitting: Internal Medicine

## 2014-04-20 ENCOUNTER — Encounter: Payer: Self-pay | Admitting: Gastroenterology

## 2014-05-13 ENCOUNTER — Telehealth: Payer: Self-pay

## 2014-05-13 NOTE — Telephone Encounter (Signed)
LVM for pt to call back.    RE: scheduling AWV for 2015 with NP or PA if pt allows.  

## 2014-05-22 ENCOUNTER — Encounter: Payer: Self-pay | Admitting: Internal Medicine

## 2014-08-04 ENCOUNTER — Other Ambulatory Visit: Payer: Self-pay | Admitting: *Deleted

## 2014-08-04 MED ORDER — OMEPRAZOLE 20 MG PO CPDR: 20.0000 mg | DELAYED_RELEASE_CAPSULE | Freq: Every day | ORAL | Status: AC

## 2014-09-29 ENCOUNTER — Other Ambulatory Visit: Payer: Self-pay | Admitting: Internal Medicine

## 2016-01-01 ENCOUNTER — Ambulatory Visit: Payer: 59 | Admitting: Psychology

## 2016-01-07 ENCOUNTER — Ambulatory Visit: Payer: 59 | Admitting: Psychology

## 2016-01-17 ENCOUNTER — Ambulatory Visit: Payer: 59 | Admitting: Psychology

## 2016-01-24 ENCOUNTER — Ambulatory Visit (INDEPENDENT_AMBULATORY_CARE_PROVIDER_SITE_OTHER): Payer: 59 | Admitting: Psychology

## 2016-01-24 DIAGNOSIS — F331 Major depressive disorder, recurrent, moderate: Secondary | ICD-10-CM | POA: Diagnosis not present

## 2016-01-26 ENCOUNTER — Emergency Department (HOSPITAL_COMMUNITY)
Admission: EM | Admit: 2016-01-26 | Discharge: 2016-01-26 | Disposition: A | Discharge status: Home / Self Care | Payer: Medicare Other | Attending: Emergency Medicine | Admitting: Emergency Medicine

## 2016-01-26 ENCOUNTER — Encounter (HOSPITAL_COMMUNITY): Payer: Self-pay | Admitting: Emergency Medicine

## 2016-01-26 DIAGNOSIS — Z7982 Long term (current) use of aspirin: Secondary | ICD-10-CM | POA: Diagnosis not present

## 2016-01-26 DIAGNOSIS — Z7951 Long term (current) use of inhaled steroids: Secondary | ICD-10-CM | POA: Diagnosis not present

## 2016-01-26 DIAGNOSIS — I10 Essential (primary) hypertension: Secondary | ICD-10-CM | POA: Diagnosis not present

## 2016-01-26 DIAGNOSIS — Z87891 Personal history of nicotine dependence: Secondary | ICD-10-CM | POA: Diagnosis not present

## 2016-01-26 DIAGNOSIS — E785 Hyperlipidemia, unspecified: Secondary | ICD-10-CM | POA: Diagnosis not present

## 2016-01-26 DIAGNOSIS — M1990 Unspecified osteoarthritis, unspecified site: Secondary | ICD-10-CM | POA: Insufficient documentation

## 2016-01-26 DIAGNOSIS — F329 Major depressive disorder, single episode, unspecified: Secondary | ICD-10-CM | POA: Diagnosis not present

## 2016-01-26 DIAGNOSIS — Z79899 Other long term (current) drug therapy: Secondary | ICD-10-CM | POA: Diagnosis not present

## 2016-01-26 DIAGNOSIS — F419 Anxiety disorder, unspecified: Secondary | ICD-10-CM | POA: Diagnosis not present

## 2016-01-26 NOTE — ED Notes (Signed)
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 Pt continues to reports medications unsuccessful treatment for symptoms.

## 2016-01-26 NOTE — ED Notes (Addendum)
Patient has been working with PCP to manage medications for depression and anxiety.  Zoloft recently increased and Clonazepam added.  A referral to Dr. Adele Schilder was also made.  Patient reluctant to take Clonazepam because of side effects she read about.  Patient stressed about caring for her husband, who is ill and lives at home.  Patient is working with a Engineer, water in addition to PCP.

## 2016-01-26 NOTE — ED Provider Notes (Signed)
CSN: [id number]     Arrival date & time 01/26/16  0935 History   First MD Initiated Contact with Patient 01/26/16 [phone]     Chief Complaint  Patient presents with  . Anxiety     (Consider location/radiation/quality/duration/timing/severity/associated sxs/prior Treatment) Patient is a 82 y.o. female presenting with anxiety. The history is provided by the patient and a relative.  Anxiety Pertinent negatives include no chest pain, no abdominal pain, no headaches and no shortness of breath.   Patient with long-standing history of depression and anxiety. 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 Patient's been under the care of [facility] family care as well as a psychologist. Recent change in medications to help with her anxiety and depression. Zoloft was increased and patient was switched from Ativan to Klonopin. Suspect due to the fact that that would last longer. Patient was nervous about taking the Klonopin because of things she read about it so she has not been taking it. Patient feeling very anxious today. Patient denies any suicidal thoughts. Patient also given referral to geriatric psych [facility] just in the past 2 days. Waiting to hear when appointment time will be.  Past Medical History  Diagnosis Date  . Allergy   . GERD (gastroesophageal reflux disease)   . Arthritis   . Hyperlipidemia   . Spinal stenosis   . Ovarian cyst   . Hearing loss 12-08-11    bil  . S/P right THA, AA 12/16/2011  . Depression   . Endometriosis   . Hypertension   . Osteoporosis    Past Surgical History  Procedure Laterality Date  . Hernia repair    . Oophorectomy      bilateral  . Hysteroscopy    . Dilation and curettage of uterus    . Tibia fracture surgery      trauma  . Cataract extraction, bilateral  12-08-11    bil  . Steriod injection  12/09/11    to spine for rt calf pain and spasms  . Total  hip arthroplasty  12/16/2011    Procedure: TOTAL HIP ARTHROPLASTY ANTERIOR APPROACH;  Surgeon: Mauri Pole, MD;  Location: [facility];  Service: Orthopedics;  Laterality: Right;   Family History  Problem Relation Age of Onset  . Cancer Father     esophogeal  . Stroke Sister   . Diabetes Daughter   . Cancer Paternal Aunt     stomach  . Breast cancer Maternal Grandmother     Age 58   Social History  Substance Use Topics  . Smoking status: Former Research scientist (life sciences)  . Smokeless tobacco: None  . Alcohol Use: 0.5 oz/week    1 drink(s) per week   OB History    Gravida Para Term Preterm AB TAB SAB Ectopic Multiple Living   4 4 4       2      Review of Systems  Constitutional: Positive for fatigue. Negative for fever.  HENT: Negative for congestion.   Eyes: Negative for visual disturbance.  Respiratory: Negative for shortness of breath.   Cardiovascular: Negative for chest pain.  Gastrointestinal: Negative for abdominal pain.  Genitourinary: Negative for dysuria.  Skin: Negative for rash.  Neurological: Negative for headaches.  Hematological: Does not bruise/bleed easily.  Psychiatric/Behavioral: Negative for suicidal ideas and confusion. The patient is nervous/anxious.       Allergies  Mercury; Codeine;  and Sulfonamide derivatives  Home Medications   Prior to Admission medications   Medication Sig Start Date End Date Taking? Authorizing Provider  acetaminophen (TYLENOL) 325 MG tablet Take 650 mg by mouth every 4 (four) hours as needed for mild pain, moderate pain, fever or headache.    Yes Historical Provider, MD  aspirin EC 81 MG tablet Take 81 mg by mouth daily.   Yes Historical Provider, MD  cholecalciferol (VITAMIN D) 1000 UNITS tablet Take 2,000 Units by mouth daily.    Yes Historical Provider, MD  clonazePAM (KLONOPIN) 1 MG tablet Take 0.5-1 mg by mouth 2 (two) times daily as needed for anxiety.   Yes Historical Provider, MD  diltiazem (CARDIZEM CD) 180 MG 24 hr capsule TAKE AS  DIRECTED Patient taking differently: Takes 180mg  by mouth every morning 09/29/14  Yes Bruce Kendall Flack, MD  fluticasone (FLONASE) 50 MCG/ACT nasal spray Place 2 sprays into both nostrils daily.   Yes Historical Provider, MD  Multiple Vitamin (MULTIVITAMIN) tablet Take 1 tablet by mouth daily.     Yes Historical Provider, MD  omeprazole (PRILOSEC) 20 MG capsule Take 1 capsule (20 mg total) by mouth daily. 08/04/14  Yes Bruce Kendall Flack, MD  ondansetron (ZOFRAN) 4 MG tablet Take 2-4 mg by mouth every 8 (eight) hours as needed for nausea or vomiting.   Yes Historical Provider, MD  QUEtiapine (SEROQUEL) 25 MG tablet Take 25 mg by mouth at bedtime.   Yes Historical Provider, MD  sertraline (ZOLOFT) 25 MG tablet Take 50 mg by mouth daily.   Yes Historical Provider, MD  simvastatin (ZOCOR) 20 MG tablet TAKE 1 TABLET EVERY DAY 09/29/13  Yes Lisabeth Pick, MD  triamcinolone cream (KENALOG) 0.1 % Place 1 application vaginally 2 (two) times daily as needed (for itching).   Yes Historical Provider, MD   BP 175/82 mmHg  Pulse 52  Temp(Src) 97.8 F (36.6 C) (Oral)  Resp 18  SpO2 98% Physical Exam  Constitutional: She is oriented to person, place, and time. She appears well-developed and well-nourished. No distress.  HENT:  Head: Normocephalic and atraumatic.  Mouth/Throat: Oropharynx is clear and moist.  Eyes: Conjunctivae and EOM are normal. Pupils are equal, round, and reactive to light.  Neck: Normal range of motion. Neck supple.  Cardiovascular: Normal rate, regular rhythm and normal heart sounds.   No murmur heard. Pulmonary/Chest: Effort normal and breath sounds normal.  Abdominal: Soft. Bowel sounds are normal.  Musculoskeletal: Normal range of motion.  Neurological: She is alert and oriented to person, place, and time. No cranial nerve deficit. She exhibits normal muscle tone. Coordination normal.  Skin: Skin is warm. No rash noted.  Nursing note and vitals reviewed.   ED Course  Procedures  (including critical care time) Labs Review Labs Reviewed - No data to display  Imaging Review No results found. I have personally reviewed and evaluated these images and lab results as part of my medical decision-making.   EKG Interpretation None      MDM   Final diagnoses:  Anxiety  Essential hypertension   Patient with long-standing history of depression and anxiety. Currently followed by primary care no vomiting. Patient with recent medication changes as of just 1-2 days ago. Patient got nervous about taking her Klonopin. Recommended that it probably best in the short run to take that. She also has a referral to psychiatry for additional input. Patient also does have a psychologist that she's been seen. Patient here without any acute medical findings  other than concerns for elevated blood pressure which may be due to the anxiety but close follow-up of this would be important. Recommended patient continue her current increase in Zoloft and they Klonopin.    Fredia Sorrow, MD 01/26/16 1106

## 2016-01-26 NOTE — Discharge Instructions (Signed)
I recommend following the treatment regiment that your primary care doctor prescribed yesterday. Follow-up with psychiatry as suggested. Also recommend following your blood pressures at home to make sure that we haven't had a new change in your hypertension. Return for any new or worse symptoms.

## 2016-01-29 ENCOUNTER — Observation Stay (HOSPITAL_COMMUNITY)
Admission: EM | Admit: 2016-01-29 | Discharge: 2016-02-03 | Disposition: A | Discharge status: Home / Self Care | Payer: Medicare Other | Attending: Internal Medicine | Admitting: Internal Medicine

## 2016-01-29 ENCOUNTER — Emergency Department (HOSPITAL_COMMUNITY): Payer: Medicare Other

## 2016-01-29 ENCOUNTER — Encounter (HOSPITAL_COMMUNITY): Payer: Self-pay | Admitting: Emergency Medicine

## 2016-01-29 DIAGNOSIS — K219 Gastro-esophageal reflux disease without esophagitis: Secondary | ICD-10-CM | POA: Diagnosis not present

## 2016-01-29 DIAGNOSIS — K5900 Constipation, unspecified: Secondary | ICD-10-CM | POA: Insufficient documentation

## 2016-01-29 DIAGNOSIS — D649 Anemia, unspecified: Secondary | ICD-10-CM | POA: Diagnosis not present

## 2016-01-29 DIAGNOSIS — E860 Dehydration: Secondary | ICD-10-CM | POA: Diagnosis not present

## 2016-01-29 DIAGNOSIS — Z7982 Long term (current) use of aspirin: Secondary | ICD-10-CM | POA: Insufficient documentation

## 2016-01-29 DIAGNOSIS — F419 Anxiety disorder, unspecified: Secondary | ICD-10-CM | POA: Diagnosis not present

## 2016-01-29 DIAGNOSIS — R5383 Other fatigue: Secondary | ICD-10-CM

## 2016-01-29 DIAGNOSIS — Z87891 Personal history of nicotine dependence: Secondary | ICD-10-CM | POA: Diagnosis not present

## 2016-01-29 DIAGNOSIS — J309 Allergic rhinitis, unspecified: Secondary | ICD-10-CM | POA: Insufficient documentation

## 2016-01-29 DIAGNOSIS — E785 Hyperlipidemia, unspecified: Secondary | ICD-10-CM | POA: Diagnosis not present

## 2016-01-29 DIAGNOSIS — F332 Major depressive disorder, recurrent severe without psychotic features: Secondary | ICD-10-CM | POA: Diagnosis present

## 2016-01-29 DIAGNOSIS — I350 Nonrheumatic aortic (valve) stenosis: Secondary | ICD-10-CM | POA: Insufficient documentation

## 2016-01-29 DIAGNOSIS — Z7951 Long term (current) use of inhaled steroids: Secondary | ICD-10-CM | POA: Diagnosis not present

## 2016-01-29 DIAGNOSIS — H9193 Unspecified hearing loss, bilateral: Secondary | ICD-10-CM | POA: Diagnosis not present

## 2016-01-29 DIAGNOSIS — Z96641 Presence of right artificial hip joint: Secondary | ICD-10-CM | POA: Diagnosis not present

## 2016-01-29 DIAGNOSIS — I951 Orthostatic hypotension: Principal | ICD-10-CM | POA: Insufficient documentation

## 2016-01-29 DIAGNOSIS — R001 Bradycardia, unspecified: Secondary | ICD-10-CM | POA: Diagnosis present

## 2016-01-29 DIAGNOSIS — M1610 Unilateral primary osteoarthritis, unspecified hip: Secondary | ICD-10-CM | POA: Insufficient documentation

## 2016-01-29 DIAGNOSIS — Z79899 Other long term (current) drug therapy: Secondary | ICD-10-CM | POA: Diagnosis not present

## 2016-01-29 DIAGNOSIS — F329 Major depressive disorder, single episode, unspecified: Secondary | ICD-10-CM | POA: Insufficient documentation

## 2016-01-29 DIAGNOSIS — I10 Essential (primary) hypertension: Secondary | ICD-10-CM | POA: Diagnosis present

## 2016-01-29 DIAGNOSIS — R509 Fever, unspecified: Secondary | ICD-10-CM

## 2016-01-29 DIAGNOSIS — F32A Depression, unspecified: Secondary | ICD-10-CM | POA: Insufficient documentation

## 2016-01-29 LAB — I-STAT CHEM 8, ED
BUN: 17 mg/dL (ref 6–20)
Calcium, Ion: 1.11 mmol/L — ABNORMAL LOW (ref 1.12–1.23)
Chloride: 98 mmol/L — ABNORMAL LOW (ref 101–111)
Creatinine, Ser: 0.8 mg/dL (ref 0.44–1.00)
Glucose, Bld: 96 mg/dL (ref 65–99)
HCT: 39 % (ref 36.0–46.0)
Hemoglobin: 13.3 g/dL (ref 12.0–15.0)
Potassium: 3.8 mmol/L (ref 3.5–5.1)
Sodium: 137 mmol/L (ref 135–145)
TCO2: 28 mmol/L (ref 0–100)

## 2016-01-29 LAB — URINALYSIS, ROUTINE W REFLEX MICROSCOPIC
BILIRUBIN URINE: NEGATIVE
GLUCOSE, UA: NEGATIVE mg/dL
HGB URINE DIPSTICK: NEGATIVE
KETONES UR: NEGATIVE mg/dL
Leukocytes, UA: NEGATIVE
NITRITE: NEGATIVE
PH: 6.5 (ref 5.0–8.0)
Protein, ur: NEGATIVE mg/dL
Specific Gravity, Urine: 1.016 (ref 1.005–1.030)

## 2016-01-29 LAB — I-STAT TROPONIN, ED: Troponin i, poc: 0 ng/mL (ref 0.00–0.08)

## 2016-01-29 MED ORDER — CLONAZEPAM 0.5 MG PO TABS
0.5000 mg | ORAL_TABLET | Freq: Two times a day (BID) | ORAL | Status: DC | PRN
  Administered 2016-01-29 – 2016-02-02 (×7): 0.5 mg via ORAL
  Filled 2016-01-29 (×7): qty 1

## 2016-01-29 MED ORDER — SODIUM CHLORIDE 0.9 % IV BOLUS (SEPSIS)
500.0000 mL | Freq: Once | INTRAVENOUS | Status: AC
  Administered 2016-01-29: 500 mL via INTRAVENOUS

## 2016-01-29 NOTE — H&P (Signed)
History and Physical    [name] [id number] DOB: 1923-03-06 DOA: 01/29/2016  PCP: [name], MD   Patient coming from: [facility]  Chief Complaint: Bradycardia.  HPI: [name] is a 82 y.o. female with medical history significant of seasonal allergies, GERD, osteoarthritis, osteoporosis, hyperlipidemia, spinal stenosis, depression, hypertension treated with Cardizem who was seen in the emergency department on Saturday due to anxiety and depression.  Today the patient was at [facility] behavioral health services for psych assessment, when she was noticed to be bradycardic in the 40s and was referred to the emergency department. She denies chest pain, palpitations, diaphoresis, PND, orthopnea or pitting edema of the lower extremities. She states that recently she has been feeling postural dizziness when standing or sitting up.   She also mentions that she has been feeling anxious and depressed. She has lost 2 of her grandchildren in the past and recently her dog died. She also feels overwhelmed at times, since she is taking care for her elderly husband who suffered from polio at age 1. She denies suicidal or homicidal ideations.  ED Course: EKG confirmed sinus bradycardia.  Review of Systems: As per HPI otherwise 10 point review of systems negative.  Past Medical History  Diagnosis Date  . Allergy   . GERD (gastroesophageal reflux disease)   . Arthritis   . Hyperlipidemia   . Spinal stenosis   . Ovarian cyst   . Hearing loss 12-08-11    bil  . S/P right THA, AA 12/16/2011  . Depression   . Endometriosis   . Hypertension   . Osteoporosis     Past Surgical History  Procedure Laterality Date  . Hernia repair    . Oophorectomy      bilateral  . Hysteroscopy    . Dilation and curettage of uterus    . Tibia fracture surgery      trauma  . Cataract extraction, bilateral  12-08-11    bil  . Steriod injection  12/09/11    to spine for rt calf  pain and spasms  . Total hip arthroplasty  12/16/2011    Procedure: TOTAL HIP ARTHROPLASTY ANTERIOR APPROACH;  Surgeon: Mauri Pole, MD;  Location: [facility];  Service: Orthopedics;  Laterality: Right;     reports that she has quit smoking. She does not have any smokeless tobacco history on file. She reports that she drinks about 0.5 oz of alcohol per week. She reports that she does not use illicit drugs.  Allergies  Allergen Reactions  . Mercury Rash  . Codeine Other (See Comments)    fainted  . Sulfonamide Derivatives Rash    Family History  Problem Relation Age of Onset  . Cancer Father     esophogeal  . Stroke Sister   . Diabetes Daughter   . Cancer Paternal Aunt     stomach  . Breast cancer Maternal Grandmother     Age 29    Prior to Admission medications   Medication Sig Start Date End Date Taking? Authorizing Provider  acetaminophen (TYLENOL) 325 MG tablet Take 650 mg by mouth every 4 (four) hours as needed for mild pain, moderate pain, fever or headache.    Yes Historical Provider, MD  aspirin EC 81 MG tablet Take 81 mg by mouth daily.   Yes Historical Provider, MD  cholecalciferol (VITAMIN D) 1000 UNITS tablet Take 2,000 Units by mouth daily.    Yes Historical Provider, MD  clonazePAM Bobbye Charleston) 1  MG tablet Take 0.5-1 mg by mouth 2 (two) times daily as needed for anxiety.   Yes Historical Provider, MD  diltiazem (CARDIZEM CD) 180 MG 24 hr capsule TAKE AS DIRECTED Patient taking differently: Takes 180mg  by mouth every morning 09/29/14  Yes Bruce Kendall Flack, MD  fluticasone (FLONASE) 50 MCG/ACT nasal spray Place 2 sprays into both nostrils daily.   Yes Historical Provider, MD  Multiple Vitamin (MULTIVITAMIN) tablet Take 1 tablet by mouth daily.     Yes Historical Provider, MD  omeprazole (PRILOSEC) 20 MG capsule Take 1 capsule (20 mg total) by mouth daily. 08/04/14  Yes Bruce Kendall Flack, MD  ondansetron (ZOFRAN) 4 MG tablet Take 2-4 mg by mouth every 8 (eight) hours as needed  for nausea or vomiting.   Yes Historical Provider, MD  sertraline (ZOLOFT) 25 MG tablet Take 50 mg by mouth daily.   Yes Historical Provider, MD  simvastatin (ZOCOR) 20 MG tablet TAKE 1 TABLET EVERY DAY Patient taking differently: TAKE 20 MG BY MOUTH EVERY EVENING 09/29/13  Yes Bruce Kendall Flack, MD  triamcinolone cream (KENALOG) 0.1 % Place 1 application vaginally 2 (two) times daily as needed (for itching).   Yes Historical Provider, MD    Physical Exam: Filed Vitals:   01/29/16 1958 01/29/16 2237 01/29/16 2241 01/29/16 2311  BP: 186/78 167/77 167/77 174/76  Pulse: 44 51 48 47  Temp: 97.7 F (36.5 C)     TempSrc: Oral     Resp: 17 23 23 22   SpO2: 96%  96% 93%      Constitutional: NAD, calm, comfortable Filed Vitals:   01/29/16 1958 01/29/16 2237 01/29/16 2241 01/29/16 2311  BP: 186/78 167/77 167/77 174/76  Pulse: 44 51 48 47  Temp: 97.7 F (36.5 C)     TempSrc: Oral     Resp: 17 23 23 22   SpO2: 96%  96% 93%   Eyes: PERRL, lids and conjunctivae normal ENMT: Mucous membranes are moist. Posterior pharynx clear of any exudate or lesions. Neck: normal, supple, no masses, no thyromegaly Respiratory: clear to auscultation bilaterally, no wheezing, no crackles. Normal respiratory effort.  Cardiovascular: Bradycardic at 48 bpm, no murmurs / rubs / gallops. No extremity edema. 2+ pedal pulses. No carotid bruits.  Abdomen: no tenderness, no masses palpated. No hepatosplenomegaly. Bowel sounds positive.  Musculoskeletal: No clubbing / cyanosis. No joint deformity upper and lower extremities.                            Good ROM, no contractures. Normal muscle tone.  Skin: Small healing laceration on the occipital area. Neurologic: CN 2-12 grossly intact. Sensation intact, DTR normal. Strength 5/5 in all 4.  Psychiatric: Normal judgment and insight. Alert and oriented x 4. Normal mood.    Labs on Admission: I have personally reviewed following labs and imaging studies  CBC:  Recent  Labs Lab 01/29/16 2116  HGB 13.3  HCT 123XX123   Basic Metabolic Panel:  Recent Labs Lab 01/29/16 2116  NA 137  K 3.8  CL 98*  GLUCOSE 96  BUN 17  CREATININE 0.80   Urine analysis:    Component Value Date/Time   COLORURINE YELLOW 01/29/2016 2130   APPEARANCEUR CLEAR 01/29/2016 2130   LABSPEC 1.016 01/29/2016 2130   PHURINE 6.5 01/29/2016 2130   GLUCOSEU NEGATIVE 01/29/2016 2130   HGBUR NEGATIVE 01/29/2016 2130   BILIRUBINUR NEGATIVE 01/29/2016 2130   BILIRUBINUR n 12/06/2010   KETONESUR NEGATIVE  01/29/2016 2130   PROTEINUR NEGATIVE 01/29/2016 2130   PROTEINUR n 12/06/2010   UROBILINOGEN 0.2 12/19/2011 1104   UROBILINOGEN 0.2 12/06/2010   NITRITE NEGATIVE 01/29/2016 2130   NITRITE n 12/06/2010   LEUKOCYTESUR NEGATIVE 01/29/2016 2130    Radiological Exams on Admission: Ct Head Wo Contrast  01/29/2016  CLINICAL DATA:  Status post fall.  Anxiety and depression. EXAM: CT HEAD WITHOUT CONTRAST TECHNIQUE: Contiguous axial images were obtained from the base of the skull through the vertex without intravenous contrast. COMPARISON:  None. FINDINGS: There is no evidence of mass effect, midline shift, or extra-axial fluid collections. There is no evidence of a space-occupying lesion or intracranial hemorrhage. There is no evidence of a cortical-based area of acute infarction. There is generalized cerebral atrophy. There is periventricular white matter low attenuation likely secondary to microangiopathy. The ventricles and sulci are appropriate for the patient's age. The basal cisterns are patent. Visualized portions of the orbits are unremarkable. The visualized portions of the paranasal sinuses and mastoid air cells are unremarkable. Cerebrovascular atherosclerotic calcifications are noted. The osseous structures are unremarkable. IMPRESSION: No acute intracranial pathology. Electronically Signed   By: Kathreen Devoid   On: 01/29/2016 22:19    EKG: Independently reviewed.  Vent. rate 44  BPM PR interval * ms QRS duration 94 ms QT/QTc 480/411 ms P-R-T axes 35 -16 56 Sinus bradycardia Borderline left axis deviation Decrease rate when compared to previous ECG.  Assessment/Plan Principal Problem:   Symptomatic bradycardia Observation/Telemetry monitoring Serial troponin levels. Discontinue Cardizem. Check TSH level. Follow-up electrocardiogram in the morning. Check echocardiogram in a.m.  Active Problems:   Depression Continue sertraline. Patient to reschedule behavioral health follow-up.    Hypertension Discontinue Cardizem. Start amlodipine 5 mg by mouth daily. Monitor blood pressure.    Hyperlipidemia Continue simvastatin 20 mg by mouth daily. Follow LFTs periodically.    GERD (gastroesophageal reflux disease) Continue proton pump inhibitor.   DVT prophylaxis: Lovenox. Code Status: Full. Family Communication:  Disposition Plan: Overnight telemetry monitoring, troponin levels trending and echocardiogram in a.m. Switch Cardizem to amlodipine. Consults called:  Admission status: Observation/telemetry.   Reubin Milan MD [facility] Pager [phone].  If 7PM-7AM, please contact night-coverage [email or web address] Password [facility]  01/29/2016, 11:39 PM

## 2016-01-29 NOTE — ED Notes (Signed)
RN is starting a lV line and blood work

## 2016-01-29 NOTE — ED Provider Notes (Signed)
Emergency Department Provider Note  Time seen: Approximately 9:21 PM  I have reviewed the triage vital signs and the nursing notes.   HISTORY  Chief Complaint Bradycardia   HPI DANESHA HOLL is a 82 y.o. female with PMH of HTN, HLD, GERD, and depression presents to the emergency department for evaluation of bradycardia. The patient was seen in the emergency department on 7/8 for anxiety. She notes some generalized fatigue. The patient was newly started on Zoloft and Klonopin which she has been taking. She noted some mild chest pain today during a psychiatry visit. Her pulse was taken and found to be in the low 40s and she was referred to the emergency department for further evaluation. She denies any syncopal events. She did have a mechanical fall several days ago with head trauma but did not present to the emergency department at that time. She is not on blood thinners. She has been taking Cardizem for a Jayel Scaduto time with no dose changes.  Past Medical History  Diagnosis Date  . Allergy   . GERD (gastroesophageal reflux disease)   . Arthritis   . Hyperlipidemia   . Spinal stenosis   . Ovarian cyst   . Hearing loss 12-08-11    bil  . S/P right THA, AA 12/16/2011  . Depression   . Endometriosis   . Hypertension   . Osteoporosis     Patient Active Problem List   Diagnosis Date Noted  . Depression   . Hypertension   . Osteoporosis   . Hyperlipidemia   . GERD (gastroesophageal reflux disease)   . Spinal stenosis   . Hearing loss 12/08/2011  . Osteoarthritis of hip 12/02/2011  . Endometriosis   . DIVERTICULOSIS-COLON 02/12/2009  . ALLERGIC RHINITIS 01/19/2007  . ESOPHAGEAL STRICTURE 01/19/2007    Past Surgical History  Procedure Laterality Date  . Hernia repair    . Oophorectomy      bilateral  . Hysteroscopy    . Dilation and curettage of uterus    . Tibia fracture surgery      trauma  . Cataract extraction, bilateral  12-08-11    bil  . Steriod injection  12/09/11     to spine for rt calf pain and spasms  . Total hip arthroplasty  12/16/2011    Procedure: TOTAL HIP ARTHROPLASTY ANTERIOR APPROACH;  Surgeon: Mauri Pole, MD;  Location: [facility];  Service: Orthopedics;  Laterality: Right;    Current Outpatient Rx  Name  Route  Sig  Dispense  Refill  . acetaminophen (TYLENOL) 325 MG tablet   Oral   Take 650 mg by mouth every 4 (four) hours as needed for mild pain, moderate pain, fever or headache.          Marland Kitchen aspirin EC 81 MG tablet   Oral   Take 81 mg by mouth daily.         . cholecalciferol (VITAMIN D) 1000 UNITS tablet   Oral   Take 2,000 Units by mouth daily.          . clonazePAM (KLONOPIN) 1 MG tablet   Oral   Take 0.5-1 mg by mouth 2 (two) times daily as needed for anxiety.         Marland Kitchen diltiazem (CARDIZEM CD) 180 MG 24 hr capsule      TAKE AS DIRECTED Patient taking differently: Takes 180mg  by mouth every morning   30 capsule   0     NEEDS OV   .  fluticasone (FLONASE) 50 MCG/ACT nasal spray   Each Nare   Place 2 sprays into both nostrils daily.         . Multiple Vitamin (MULTIVITAMIN) tablet   Oral   Take 1 tablet by mouth daily.           Marland Kitchen omeprazole (PRILOSEC) 20 MG capsule   Oral   Take 1 capsule (20 mg total) by mouth daily.   90 capsule   0     NEEDS OV   . ondansetron (ZOFRAN) 4 MG tablet   Oral   Take 2-4 mg by mouth every 8 (eight) hours as needed for nausea or vomiting.         . sertraline (ZOLOFT) 25 MG tablet   Oral   Take 50 mg by mouth daily.         . simvastatin (ZOCOR) 20 MG tablet      TAKE 1 TABLET EVERY DAY Patient taking differently: TAKE 20 MG BY MOUTH EVERY EVENING   90 tablet   0   . triamcinolone cream (KENALOG) 0.1 %   Vaginal   Place 1 application vaginally 2 (two) times daily as needed (for itching).           Allergies Mercury; Codeine; and Sulfonamide derivatives  Family History  Problem Relation Age of Onset  . Cancer Father     esophogeal  . Stroke  Sister   . Diabetes Daughter   . Cancer Paternal Aunt     stomach  . Breast cancer Maternal Grandmother     Age 41    Social History Social History  Substance Use Topics  . Smoking status: Former Research scientist (life sciences)  . Smokeless tobacco: None  . Alcohol Use: 0.5 oz/week    1 drink(s) per week    Review of Systems  Constitutional: No fever/chills Eyes: No visual changes. ENT: No sore throat. Cardiovascular: One episode of chest pain. Respiratory: Denies shortness of breath. Gastrointestinal: No abdominal pain.  No nausea, no vomiting.  No diarrhea.  No constipation. Genitourinary: Negative for dysuria. Musculoskeletal: Negative for back pain. Skin: Negative for rash. Neurological: Negative for headaches, focal weakness or numbness.  10-point ROS otherwise negative.  ____________________________________________   PHYSICAL EXAM:  VITAL SIGNS: ED Triage Vitals  Enc Vitals Group     BP 01/29/16 1958 186/78 mmHg     Pulse Rate 01/29/16 1958 44     Resp 01/29/16 1958 17     Temp 01/29/16 1958 97.7 F (36.5 C)     Temp Source 01/29/16 1958 Oral     SpO2 01/29/16 1958 96 %    Constitutional: Alert and oriented. Well appearing and in no acute distress. Eyes: Conjunctivae are normal. PERRL. EOMI. Head: Atraumatic. Nose: No congestion/rhinnorhea. Mouth/Throat: Mucous membranes are moist.  Oropharynx non-erythematous. Neck: No stridor.  No meningeal signs. No cervical spine tenderness to palpation. Cardiovascular: Bradycardia. Good peripheral circulation. Grossly normal heart sounds.   Respiratory: Normal respiratory effort.  No retractions. Lungs CTAB. Gastrointestinal: Soft and nontender. No distention.  Musculoskeletal: No lower extremity tenderness nor edema. No gross deformities of extremities. Neurologic:  Normal speech and language. No gross focal neurologic deficits are appreciated.  Skin:  Skin is warm, dry and intact. No rash noted. Psychiatric: Mood and affect are  normal. Speech and behavior are normal.  ____________________________________________   LABS (all labs ordered are listed, but only abnormal results are displayed)  Labs Reviewed  I-STAT CHEM 8, ED - Abnormal; Notable for the following:  Chloride 98 (*)    Calcium, Ion 1.11 (*)    All other components within normal limits  URINALYSIS, ROUTINE W REFLEX MICROSCOPIC (NOT AT [facility])  TSH  MAGNESIUM  CBC  I-STAT TROPOININ, ED   ____________________________________________  EKG  Reviewed in MUSE. Bradycardia. No ischemia.  ____________________________________________  RADIOLOGY  Ct Head Wo Contrast  01/29/2016  CLINICAL DATA:  Status post fall.  Anxiety and depression. EXAM: CT HEAD WITHOUT CONTRAST TECHNIQUE: Contiguous axial images were obtained from the base of the skull through the vertex without intravenous contrast. COMPARISON:  None. FINDINGS: There is no evidence of mass effect, midline shift, or extra-axial fluid collections. There is no evidence of a space-occupying lesion or intracranial hemorrhage. There is no evidence of a cortical-based area of acute infarction. There is generalized cerebral atrophy. There is periventricular white matter low attenuation likely secondary to microangiopathy. The ventricles and sulci are appropriate for the patient's age. The basal cisterns are patent. Visualized portions of the orbits are unremarkable. The visualized portions of the paranasal sinuses and mastoid air cells are unremarkable. Cerebrovascular atherosclerotic calcifications are noted. The osseous structures are unremarkable. IMPRESSION: No acute intracranial pathology. Electronically Signed   By: Kathreen Devoid   On: 01/29/2016 22:19    ____________________________________________   PROCEDURES  Procedure(s) performed:   Procedures  None ____________________________________________   INITIAL IMPRESSION / ASSESSMENT AND PLAN / ED COURSE  Pertinent labs & imaging results  that were available during my care of the patient were reviewed by me and considered in my medical decision making (see chart for details).  Patient presents to the emergency department for evaluation of bradycardia. Review of prior encounters shows heart rate in the 50s but nothing ever in the low 40s. Her blood pressure is actually elevated in the emergency department despite bradycardia. Plan for labs to rule out underlying electrolyte abnormality. Patient did have some chest discomfort with the bradycardia today but it was fairly atypical. Low suspicion for ACS or new ischemia in this setting but patient has some risk factors. Plan for labs, CT head given recent fall, and reassessment.   10:39 PM Patient remains bradycardic but slightly improved from initial presentation (high 40s to low 50s). Will discuss with hospitalist regarding admission.   11:19 PM Discussed patient's case with hospitalist, Dr. Olevia Bowens.  Recommend admission to observation, telemetry bed.  I will place holding orders per their request. Patient and family (if present) updated with plan. Care transferred to hospitalist service.  I reviewed all nursing notes, vitals, pertinent old records, EKGs, labs, imaging (as available).   ____________________________________________  FINAL CLINICAL IMPRESSION(S) / ED DIAGNOSES  Final diagnoses:  Bradycardia  Other fatigue     MEDICATIONS GIVEN DURING THIS VISIT:  Medications  clonazePAM (KLONOPIN) tablet 0.5 mg (0.5 mg Oral Given 01/29/16 2354)  sodium chloride 0.9 % bolus 500 mL (0 mLs Intravenous Stopped 01/29/16 2130)     NEW OUTPATIENT MEDICATIONS STARTED DURING THIS VISIT:  None   Note:  This document was prepared using Dragon voice recognition software and may include unintentional dictation errors.  Nanda Quinton, MD Emergency Medicine  Margette Fast, MD 01/30/16 0000

## 2016-01-29 NOTE — ED Notes (Signed)
Pt states she was here on Saturday for anxiety and depression  Pt states she went to [place] today for a psych assessment and while she was there they took her pulse manually and with a machine and it was in the 40s  They sent her here for evaluation  Pt states she seldom has chest pain and has had some shortness of breath but she associated that with her anxiety   Pt denies chest pain at this time

## 2016-01-30 ENCOUNTER — Observation Stay (HOSPITAL_BASED_OUTPATIENT_CLINIC_OR_DEPARTMENT_OTHER): Payer: Medicare Other

## 2016-01-30 DIAGNOSIS — R9431 Abnormal electrocardiogram [ECG] [EKG]: Secondary | ICD-10-CM | POA: Diagnosis not present

## 2016-01-30 DIAGNOSIS — R001 Bradycardia, unspecified: Secondary | ICD-10-CM | POA: Diagnosis not present

## 2016-01-30 DIAGNOSIS — I10 Essential (primary) hypertension: Secondary | ICD-10-CM | POA: Diagnosis not present

## 2016-01-30 DIAGNOSIS — F329 Major depressive disorder, single episode, unspecified: Secondary | ICD-10-CM

## 2016-01-30 LAB — CBC
HCT: 34.6 % — ABNORMAL LOW (ref 36.0–46.0)
HEMATOCRIT: 37.5 % (ref 36.0–46.0)
HEMOGLOBIN: 12.3 g/dL (ref 12.0–15.0)
Hemoglobin: 11.6 g/dL — ABNORMAL LOW (ref 12.0–15.0)
MCH: 31.4 pg (ref 26.0–34.0)
MCH: 30.7 pg (ref 26.0–34.0)
MCHC: 32.8 g/dL (ref 30.0–36.0)
MCHC: 33.5 g/dL (ref 30.0–36.0)
MCV: 93.8 fL (ref 78.0–100.0)
MCV: 93.5 fL (ref 78.0–100.0)
PLATELETS: 193 10*3/uL (ref 150–400)
Platelets: 203 10*3/uL (ref 150–400)
RBC: 3.69 MIL/uL — ABNORMAL LOW (ref 3.87–5.11)
RBC: 4.01 MIL/uL (ref 3.87–5.11)
RDW: 12 % (ref 11.5–15.5)
RDW: 12.1 % (ref 11.5–15.5)
WBC: 6.8 10*3/uL (ref 4.0–10.5)
WBC: 8.2 10*3/uL (ref 4.0–10.5)

## 2016-01-30 LAB — CREATININE, SERUM: Creatinine, Ser: 0.63 mg/dL (ref 0.44–1.00)

## 2016-01-30 LAB — TROPONIN I
Troponin I: <0.03 ng/mL (ref <0.03)
Troponin I: <0.03 ng/mL (ref <0.03)

## 2016-01-30 LAB — ECHOCARDIOGRAM COMPLETE
Height: 63 in
Weight: 2275.2 oz

## 2016-01-30 LAB — MAGNESIUM: Magnesium: 2 mg/dL (ref 1.7–2.4)

## 2016-01-30 LAB — MRSA PCR SCREENING: MRSA BY PCR: NEGATIVE

## 2016-01-30 LAB — TSH: TSH: 1.503 u[IU]/mL (ref 0.350–4.500)

## 2016-01-30 MED ORDER — ENSURE ENLIVE PO LIQD
237.0000 mL | Freq: Two times a day (BID) | ORAL | Status: DC
  Administered 2016-01-30: 237 mL via ORAL

## 2016-01-30 MED ORDER — SODIUM CHLORIDE 0.9% FLUSH
3.0000 mL | Freq: Two times a day (BID) | INTRAVENOUS | Status: DC
Start: 2016-01-30 — End: 2016-02-03
  Administered 2016-01-30 – 2016-02-01 (×5): 3 mL via INTRAVENOUS

## 2016-01-30 MED ORDER — ENSURE ENLIVE PO LIQD: 237.0000 mL | Freq: Every day | ORAL | Status: DC | PRN

## 2016-01-30 MED ORDER — SERTRALINE HCL 50 MG PO TABS
50.0000 mg | ORAL_TABLET | Freq: Every day | ORAL | Status: DC
  Administered 2016-01-30 – 2016-02-03 (×5): 50 mg via ORAL
  Filled 2016-01-30 (×5): qty 1

## 2016-01-30 MED ORDER — HYDRALAZINE HCL 25 MG PO TABS
25.0000 mg | ORAL_TABLET | Freq: Three times a day (TID) | ORAL | Status: DC
  Administered 2016-01-30 – 2016-01-31 (×2): 25 mg via ORAL
  Filled 2016-01-30 (×2): qty 1

## 2016-01-30 MED ORDER — SIMVASTATIN 20 MG PO TABS
20.0000 mg | ORAL_TABLET | Freq: Every day | ORAL | Status: DC
  Administered 2016-01-30 – 2016-02-02 (×4): 20 mg via ORAL
  Filled 2016-01-30 (×4): qty 1

## 2016-01-30 MED ORDER — ASPIRIN EC 81 MG PO TBEC
81.0000 mg | DELAYED_RELEASE_TABLET | Freq: Every day | ORAL | Status: DC
  Administered 2016-01-30 – 2016-02-03 (×5): 81 mg via ORAL
  Filled 2016-01-30 (×5): qty 1

## 2016-01-30 MED ORDER — ACETAMINOPHEN 325 MG PO TABS
650.0000 mg | ORAL_TABLET | Freq: Four times a day (QID) | ORAL | Status: DC | PRN
Start: 2016-01-30 — End: 2016-02-03
  Administered 2016-02-01 – 2016-02-03 (×6): 650 mg via ORAL
  Filled 2016-01-30 (×6): qty 2

## 2016-01-30 MED ORDER — PANTOPRAZOLE SODIUM 40 MG PO TBEC
40.0000 mg | DELAYED_RELEASE_TABLET | Freq: Every day | ORAL | Status: DC
  Administered 2016-01-30 – 2016-02-03 (×5): 40 mg via ORAL
  Filled 2016-01-30 (×5): qty 1

## 2016-01-30 MED ORDER — ONDANSETRON HCL 4 MG PO TABS: 2.0000 mg | ORAL_TABLET | Freq: Three times a day (TID) | ORAL | Status: DC | PRN

## 2016-01-30 MED ORDER — AMLODIPINE BESYLATE 10 MG PO TABS
10.0000 mg | ORAL_TABLET | Freq: Every day | ORAL | Status: DC
  Administered 2016-01-31: 10 mg via ORAL
  Filled 2016-01-30: qty 1

## 2016-01-30 MED ORDER — AMLODIPINE BESYLATE 5 MG PO TABS
5.0000 mg | ORAL_TABLET | Freq: Every day | ORAL | Status: DC
  Administered 2016-01-30: 5 mg via ORAL
  Filled 2016-01-30: qty 1

## 2016-01-30 MED ORDER — ENOXAPARIN SODIUM 40 MG/0.4ML ~~LOC~~ SOLN
40.0000 mg | SUBCUTANEOUS | Status: DC
  Administered 2016-01-30 – 2016-02-03 (×5): 40 mg via SUBCUTANEOUS
  Filled 2016-01-30 (×5): qty 0.4

## 2016-01-30 NOTE — Care Management Note (Signed)
Case Management Note  Patient Details  Name: [name] MRN: [id number] Date of Birth: July 15, 1923  Subjective/Objective:82 y/o f admitted w/symptomatic anemia. From home.  MD aware of patients concern for depression, & will address if Psych cons needed.                  Action/Plan:   Expected Discharge Date:                  Expected Discharge Plan:  Home/Self Care  In-House Referral:     Discharge planning Services  CM Consult  Post Acute Care Choice:    Choice offered to:     DME Arranged:    DME Agency:     HH Arranged:    HH Agency:     Status of Service:  In process, will continue to follow  If discussed at Long Length of Stay Meetings, dates discussed:    Additional Comments:  Dessa Phi, RN 01/30/2016, 10:53 AM

## 2016-01-30 NOTE — Evaluation (Signed)
Physical Therapy Evaluation Patient Details Name: [name] MRN: [id number] DOB: March 27, 1923 Today's Date: 01/30/2016   History of Present Illness  82 yo female admitted with bradycardia, depression. Hx of OA, osteoporosis, spinal stenosis, depression, HTN  Clinical Impression  On eval, pt was Min guard assist for mobility-walked ~160 feet. Pt tolerated distance well. Resting HR in 50s, HR with activity upper 60s-low 70s. Pt stated she just doesn't have any energy to do things. Recommend HHPT follow up at discharge for general strength and endurance training. Recommend daily ambulation in hallway with nursing to increase activity level.     Follow Up Recommendations Home health PT; Intermittent supervision    Equipment Recommendations  None recommended by PT    Recommendations for Other Services       Precautions / Restrictions Precautions Precautions: Fall Restrictions Weight Bearing Restrictions: No      Mobility  Bed Mobility Overal bed mobility: Modified Independent                Transfers Overall transfer level: Needs assistance   Transfers: Sit to/from Stand Sit to Stand: Supervision         General transfer comment: for safety  Ambulation/Gait Ambulation/Gait assistance: Min guard Ambulation Distance (Feet): 160 Feet Assistive device: None Gait Pattern/deviations: Step-through pattern;Decreased stride length     General Gait Details: slow gait speed. close guard for safety. no LOB.   Stairs            Wheelchair Mobility    Modified Rankin (Stroke Patients Only)       Balance Overall balance assessment: Needs assistance;History of Falls             Standing balance comment: Had pt perform static standing EO/EC, narrow BOS, withstanding of external perturbations-supervison assist. Pt also performed 360 degree turn and picked up an object off floor-Min guard assist.                              Pertinent Vitals/Pain  Pain Assessment: No/denies pain    Home Living Family/patient expects to be discharged to:: Private residence Living Arrangements: Spouse/significant other   Type of Home: House       Home Layout: One level        Prior Function Level of Independence: Independent               Hand Dominance        Extremity/Trunk Assessment   Upper Extremity Assessment: Generalized weakness           Lower Extremity Assessment: Generalized weakness      Cervical / Trunk Assessment: Kyphotic  Communication   Communication: No difficulties  Cognition Arousal/Alertness: Awake/alert Behavior During Therapy: WFL for tasks assessed/performed Overall Cognitive Status: Within Functional Limits for tasks assessed                      General Comments      Exercises        Assessment/Plan    PT Assessment Patient needs continued PT services  PT Diagnosis Difficulty walking;Generalized weakness   PT Problem List Decreased strength;Decreased mobility;Decreased balance  PT Treatment Interventions Gait training;Functional mobility training;Therapeutic activities;Patient/family education;Therapeutic exercise;Balance training   PT Goals (Current goals can be found in the Care Plan section) Acute Rehab PT Goals Patient Stated Goal: to get some energy back PT Goal Formulation: With patient Time For Goal Achievement: 02/13/16 Potential to  Achieve Goals: Good    Frequency Min 3X/week   Barriers to discharge        Co-evaluation               End of Session Equipment Utilized During Treatment: Gait belt Activity Tolerance: Patient tolerated treatment well Patient left: in bed;with call bell/phone within reach      Functional Assessment Tool Used: clinical judgement Functional Limitation: Mobility: Walking and moving around Mobility: Walking and Moving Around Current Status [id number]): At least 1 percent but less than 20 percent impaired, limited or  restricted Mobility: Walking and Moving Around Goal Status [phone]): At least 1 percent but less than 20 percent impaired, limited or restricted    Time: [id number] PT Time Calculation (min) (ACUTE ONLY): 33 min   Charges:   PT Evaluation $PT Eval Low Complexity: 1 Procedure     PT G Codes:   PT G-Codes **NOT FOR INPATIENT CLASS** Functional Assessment Tool Used: clinical judgement Functional Limitation: Mobility: Walking and moving around Mobility: Walking and Moving Around Current Status [id number]): At least 1 percent but less than 20 percent impaired, limited or restricted Mobility: Walking and Moving Around Goal Status [phone]): At least 1 percent but less than 20 percent impaired, limited or restricted    Weston Anna, MPT Pager: [phone]

## 2016-01-30 NOTE — Progress Notes (Signed)
  Echocardiogram 2D Echocardiogram has been performed.  Darlina Sicilian M 01/30/2016, 3:28 PM

## 2016-01-30 NOTE — Care Management Obs Status (Signed)
[facility] NOTIFICATION   Patient Details  Name: [name] MRN: [id number] Date of Birth: 28-Dec-1922   Medicare Observation Status Notification Given:  Yes    MahabirJuliann Pulse, RN 01/30/2016, 2:23 PM

## 2016-01-30 NOTE — Progress Notes (Signed)
PROGRESS NOTE    [name]  [id number] DOB: 01-May-1923 DOA: 01/29/2016 PCP: [name], MD    Brief Narrative: 82 yo woman with bradycardia.  Recent anxiety and depression.  Admits that she has tried multiple medications in the past couple of months.     Assessment & Plan:   Principal Problem:   Symptomatic bradycardia Active Problems:   Depression   Hypertension   Hyperlipidemia   GERD (gastroesophageal reflux disease)    Symptomatic bradycardia Observation/Telemetry monitoring Serial troponin levels. Discontinue Cardizem. TSH level normal. Sinus bradycardia on repeat EKG. Echo pending. Will request cardiology evaluation for AM.  Need to document ambulatory heart rates.   Depression Continue sertraline. [facility] consult called.   Hypertension Discontinue Cardizem. Start amlodipine 5 mg by mouth daily. Monitor blood pressure.   Hyperlipidemia Continue simvastatin 20 mg by mouth daily.   GERD (gastroesophageal reflux disease) Continue proton pump inhibitor.   DVT prophylaxis: Lovenox Code Status: FULL Family Communication: Patient alone. Disposition Plan: Home when ready.   Consultants:   [place]  Will also request Cardiology consult for AM  Procedures: Echo   Antimicrobials: NONE   Subjective: Complaining of constipation.  Requesting cardiology consultation.  Speaks openly about anxiety and depression.  Open to [facility] consult while here.  Objective: Filed Vitals:   01/29/16 2311 01/30/16 0022 01/30/16 0550 01/30/16 1500  BP: 174/76 171/65 135/78 151/89  Pulse: 47 49 44 47  Temp:  98 F (36.7 C) 97.8 F (36.6 C) 98.7 F (37.1 C)  TempSrc:  Oral Oral Oral  Resp: 22 20 20 19   Height:  5\' 3"  (1.6 m)    Weight:  64.501 kg (142 lb 3.2 oz)    SpO2: 93% 99% 96% 95%    Intake/Output Summary (Last 24 hours) at 01/30/16 1929 Last data filed at 01/30/16 1845  Gross per 24 hour  Intake      0 ml  Output   1100 ml  Net  -1100  ml   Filed Weights   01/30/16 0022  Weight: 64.501 kg (142 lb 3.2 oz)    Examination:  General exam: Appears calm and comfortable  Respiratory system: Clear to auscultation. Respiratory effort normal. Cardiovascular system: Bradycardic but regular.  No murmur.  No pedal edema. Gastrointestinal system: Abdomen is nondistended, soft and nontender.   Central nervous system: Alert and oriented. No focal neurological deficits. Psychiatry: Judgement and insight appear normal. Mood & affect appropriate.     Data Reviewed: I have personally reviewed following labs and imaging studies  CBC:  Recent Labs Lab 01/29/16 2116 01/29/16 2351 01/30/16 0309  WBC  --  6.8 8.2  HGB 13.3 12.3 11.6*  HCT 39.0 37.5 34.6*  MCV  --  93.5 93.8  PLT  --  203 0000000   Basic Metabolic Panel:  Recent Labs Lab 01/29/16 2116 01/29/16 2351 01/30/16 0309  NA 137  --   --   K 3.8  --   --   CL 98*  --   --   GLUCOSE 96  --   --   BUN 17  --   --   CREATININE 0.80  --  0.63  MG  --  2.0  --    GFR: Estimated Creatinine Clearance: 42.2 mL/min (by C-G formula based on Cr of 0.63). Cardiac Enzymes:  Recent Labs Lab 01/30/16 0309 01/30/16 0903  TROPONINI <0.03 <0.03   Thyroid Function Tests:  Recent Labs  01/29/16 2351  TSH 1.503  Recent Results (from the past 240 hour(s))  MRSA PCR Screening     Status: None   Collection Time: 01/30/16  1:18 AM  Result Value Ref Range Status   MRSA by PCR NEGATIVE NEGATIVE Final    Comment:        The GeneXpert MRSA Assay (FDA approved for NASAL specimens only), is one component of a comprehensive MRSA colonization surveillance program. It is not intended to diagnose MRSA infection nor to guide or monitor treatment for MRSA infections.          Radiology Studies: Ct Head Wo Contrast  01/29/2016  CLINICAL DATA:  Status post fall.  Anxiety and depression. EXAM: CT HEAD WITHOUT CONTRAST TECHNIQUE: Contiguous axial images were obtained  from the base of the skull through the vertex without intravenous contrast. COMPARISON:  None. FINDINGS: There is no evidence of mass effect, midline shift, or extra-axial fluid collections. There is no evidence of a space-occupying lesion or intracranial hemorrhage. There is no evidence of a cortical-based area of acute infarction. There is generalized cerebral atrophy. There is periventricular white matter low attenuation likely secondary to microangiopathy. The ventricles and sulci are appropriate for the patient's age. The basal cisterns are patent. Visualized portions of the orbits are unremarkable. The visualized portions of the paranasal sinuses and mastoid air cells are unremarkable. Cerebrovascular atherosclerotic calcifications are noted. The osseous structures are unremarkable. IMPRESSION: No acute intracranial pathology. Electronically Signed   By: Kathreen Devoid   On: 01/29/2016 22:19        Scheduled Meds: . amLODipine  5 mg Oral Daily  . aspirin EC  81 mg Oral Daily  . enoxaparin (LOVENOX) injection  40 mg Subcutaneous Q24H  . pantoprazole  40 mg Oral Daily  . sertraline  50 mg Oral Daily  . simvastatin  20 mg Oral Daily  . sodium chloride flush  3 mL Intravenous Q12H   Continuous Infusions:       Time spent: 25 minutes    Eber Jones, MD [facility] Pager [phone]  If 7PM-7AM, please contact night-coverage [email or web address] Password [facility] 01/30/2016, 7:29 PM

## 2016-01-30 NOTE — Progress Notes (Signed)
Initial Nutrition Assessment  DOCUMENTATION CODES:   Not applicable  INTERVENTION:  - Will change Ensure Enlive order to once/day PRN. This supplement provides 350 kcal, 20 grams of protein. - Continue to encourage pt with PO intakes of meals. - RD will continue to monitor for additional needs.  NUTRITION DIAGNOSIS:   Inadequate oral intake related to poor appetite as evidenced by per patient/family report.  GOAL:   Patient will meet greater than or equal to 90% of their needs  MONITOR:   PO intake, Supplement acceptance, Weight trends, Labs, I & O's  REASON FOR ASSESSMENT:   Malnutrition Screening Tool  ASSESSMENT:   82 y.o. female with medical history significant of seasonal allergies, GERD, osteoarthritis, osteoporosis, hyperlipidemia, spinal stenosis, depression, hypertension treated with Cardizem who was seen in the emergency department on Saturday due to anxiety and depression.  Pt seen for MST. BMI indicates overweight status. No intakes documented since admission. Unable to obtain PTA information from pt as she was concerned about receiving fleet enema and repeatedly states that she is upset about not seeing MD yet and states that she is feeling depressed and overwhelmed. Talked with RN about pt's concerns surrounding enema.   Pt states she has not been feeling hungry d/t depression; breakfast tray was in the room and pt had eaten 1/2 of an English muffin. Lunch order not yet delivered: [place] burger, green beans, mashed potatoes, strawberry yogurt, and hot tea.   Unable to complete physical assessment at this time d/t pt's request; will monitor for ability to complete this at follow-up. No weight hx in the chart for comparison since 2015. Unsure if pt meets criteria for malnutrition at this time due to lack of information. Ensure Enlive has been ordered BID; will decrease to once/day PRN d/t pt stating feeling overwhelmed by everything since admission.  Likely not  meeting nutrition needs at this time. Medications reviewed. Labs reviewed.    Diet Order:  Diet Heart Room service appropriate?: Yes; Fluid consistency:: Thin  Skin:  Reviewed, no issues  Last BM:  7/8  Height:   Ht Readings from Last 1 Encounters:  01/30/16 5\' 3"  (1.6 m)    Weight:   Wt Readings from Last 1 Encounters:  01/30/16 142 lb 3.2 oz (64.501 kg)    Ideal Body Weight:  52.27 kg (kg)  BMI:  Body mass index is 25.2 kg/(m^2).  Estimated Nutritional Needs:   Kcal:  1200-1400  Protein:  50-60 grams  Fluid:  >/= 1.2 L/day  EDUCATION NEEDS:   No education needs identified at this time     Jarome Matin, MS, RD, LDN Inpatient Clinical Dietitian Pager # [phone] After hours/weekend pager # [phone]

## 2016-01-31 DIAGNOSIS — I350 Nonrheumatic aortic (valve) stenosis: Secondary | ICD-10-CM | POA: Diagnosis not present

## 2016-01-31 DIAGNOSIS — F329 Major depressive disorder, single episode, unspecified: Secondary | ICD-10-CM | POA: Diagnosis not present

## 2016-01-31 DIAGNOSIS — F332 Major depressive disorder, recurrent severe without psychotic features: Secondary | ICD-10-CM | POA: Diagnosis present

## 2016-01-31 DIAGNOSIS — I951 Orthostatic hypotension: Secondary | ICD-10-CM | POA: Diagnosis not present

## 2016-01-31 DIAGNOSIS — E785 Hyperlipidemia, unspecified: Secondary | ICD-10-CM

## 2016-01-31 DIAGNOSIS — I10 Essential (primary) hypertension: Secondary | ICD-10-CM | POA: Diagnosis not present

## 2016-01-31 DIAGNOSIS — R001 Bradycardia, unspecified: Secondary | ICD-10-CM | POA: Diagnosis not present

## 2016-01-31 MED ORDER — AMLODIPINE BESYLATE 5 MG PO TABS
5.0000 mg | ORAL_TABLET | Freq: Every day | ORAL | Status: DC
  Administered 2016-02-01 – 2016-02-03 (×3): 5 mg via ORAL
  Filled 2016-01-31 (×3): qty 1

## 2016-01-31 MED ORDER — SODIUM CHLORIDE 0.9 % IV BOLUS (SEPSIS)
1000.0000 mL | Freq: Once | INTRAVENOUS | Status: AC
  Administered 2016-01-31: 1000 mL via INTRAVENOUS

## 2016-01-31 MED ORDER — POLYETHYLENE GLYCOL 3350 17 G PO PACK
17.0000 g | PACK | Freq: Two times a day (BID) | ORAL | Status: DC
  Administered 2016-01-31 – 2016-02-03 (×7): 17 g via ORAL
  Filled 2016-01-31 (×7): qty 1

## 2016-01-31 NOTE — Progress Notes (Signed)
PROGRESS NOTE    [name]  [id number] DOB: Sep 28, 1922 DOA: 01/29/2016 PCP: [name], MD   Brief Narrative:  82 yo woman with bradycardia. Recent anxiety and depression. Admits that she has tried multiple medications in the past couple of months.    Assessment & Plan:   Principal Problem:   Depression Active Problems:   Hypertension   Hyperlipidemia   GERD (gastroesophageal reflux disease)   Symptomatic bradycardia   MDD (major depressive disorder), recurrent severe, without psychosis ([place])  1. Cardiovascular. Bradycardia. Will continue to hold on av blockade, ekg personally reviewed noted sinus bradycardia. Dehydration with orthostasis. Will continue with IV fluids per cardiology recommendations. Follow orthostatic blood pressure in am. HTN Blood pressure systolic [id number] to [id number], will continue amlodipine for now.   2. Pulmonary. No signs of volume overload or pulmonary infection, will continue to monitor oxymetry and as needed supplemental 02 per [place].   3. Nephrology. Renal function with cr at 0,80 and K at 3,8. Will follow renal panel in am. Will avoid hypotension and nephrotoxic medications.  4. Neurology. Depression, Will continue sertraline, clonazepam.   DVT prophylaxis: Lovenox Code Status: FULL Family Communication: Patient alone. Disposition Plan: Home when ready.  Consultants:   cardiology  Procedures:  Antimicrobials:    Subjective: Patient feeling very weak and deconditioned, poor energy. No chest pain or palpitations, no nausea or vomiting, tolerating po well.   Objective: Filed Vitals:   01/30/16 2145 01/31/16 0546 01/31/16 0559 01/31/16 1424  BP: 165/83 167/77 167/77   Pulse: 52 43    Temp: 98.5 F (36.9 C) 98.2 F (36.8 C)  98.3 F (36.8 C)  TempSrc: Oral Oral  Oral  Resp: 20 18    Height:      Weight:      SpO2: 98% 97%  97%    Intake/Output Summary (Last 24 hours) at 01/31/16 1525 Last data filed at 01/31/16 1232  Gross per  24 hour  Intake    360 ml  Output   1750 ml  Net  -1390 ml   Filed Weights   01/30/16 0022  Weight: 64.501 kg (142 lb 3.2 oz)    Examination:  General exam: deconditioned E ENT: no conjunctival pallor, oral mucosa dry.  Respiratory system: Decreased breath sounds at bases, due to poor inspiratory effort. C Respiratory effort normal. Cardiovascular system: S1 & S2 heard, RRR. No JVD, murmurs, rubs, gallops or clicks. No pedal edema. Gastrointestinal system: Abdomen is nondistended, soft and nontender. No organomegaly or masses felt. Normal bowel sounds heard. Central nervous system: Alert and oriented. No focal neurological deficits. Extremities: Symmetric 5 x 5 power. Skin: No rashes, lesions or ulcers   Data Reviewed: I have personally reviewed following labs and imaging studies  CBC:  Recent Labs Lab 01/29/16 2116 01/29/16 2351 01/30/16 0309  WBC  --  6.8 8.2  HGB 13.3 12.3 11.6*  HCT 39.0 37.5 34.6*  MCV  --  93.5 93.8  PLT  --  203 0000000   Basic Metabolic Panel:  Recent Labs Lab 01/29/16 2116 01/29/16 2351 01/30/16 0309  NA 137  --   --   K 3.8  --   --   CL 98*  --   --   GLUCOSE 96  --   --   BUN 17  --   --   CREATININE 0.80  --  0.63  MG  --  2.0  --    GFR: Estimated Creatinine Clearance: 42.2 mL/min (by  C-G formula based on Cr of 0.63). Liver Function Tests: No results for input(s): AST, ALT, ALKPHOS, BILITOT, PROT, ALBUMIN in the last 168 hours. No results for input(s): LIPASE, AMYLASE in the last 168 hours. No results for input(s): AMMONIA in the last 168 hours. Coagulation Profile: No results for input(s): INR, PROTIME in the last 168 hours. Cardiac Enzymes:  Recent Labs Lab 01/30/16 0309 01/30/16 0903  TROPONINI <0.03 <0.03   BNP (last 3 results) No results for input(s): PROBNP in the last 8760 hours. HbA1C: No results for input(s): HGBA1C in the last 72 hours. CBG: No results for input(s): GLUCAP in the last 168 hours. Lipid  Profile: No results for input(s): CHOL, HDL, LDLCALC, TRIG, CHOLHDL, LDLDIRECT in the last 72 hours. Thyroid Function Tests:  Recent Labs  01/29/16 2351  TSH 1.503   Anemia Panel: No results for input(s): VITAMINB12, FOLATE, FERRITIN, TIBC, IRON, RETICCTPCT in the last 72 hours. Sepsis Labs: No results for input(s): PROCALCITON, LATICACIDVEN in the last 168 hours.  Recent Results (from the past 240 hour(s))  MRSA PCR Screening     Status: None   Collection Time: 01/30/16  1:18 AM  Result Value Ref Range Status   MRSA by PCR NEGATIVE NEGATIVE Final    Comment:        The GeneXpert MRSA Assay (FDA approved for NASAL specimens only), is one component of a comprehensive MRSA colonization surveillance program. It is not intended to diagnose MRSA infection nor to guide or monitor treatment for MRSA infections.          Radiology Studies: Ct Head Wo Contrast  01/29/2016  CLINICAL DATA:  Status post fall.  Anxiety and depression. EXAM: CT HEAD WITHOUT CONTRAST TECHNIQUE: Contiguous axial images were obtained from the base of the skull through the vertex without intravenous contrast. COMPARISON:  None. FINDINGS: There is no evidence of mass effect, midline shift, or extra-axial fluid collections. There is no evidence of a space-occupying lesion or intracranial hemorrhage. There is no evidence of a cortical-based area of acute infarction. There is generalized cerebral atrophy. There is periventricular white matter low attenuation likely secondary to microangiopathy. The ventricles and sulci are appropriate for the patient's age. The basal cisterns are patent. Visualized portions of the orbits are unremarkable. The visualized portions of the paranasal sinuses and mastoid air cells are unremarkable. Cerebrovascular atherosclerotic calcifications are noted. The osseous structures are unremarkable. IMPRESSION: No acute intracranial pathology. Electronically Signed   By: Kathreen Devoid   On:  01/29/2016 22:19        Scheduled Meds: . Derrill Memo ON 02/01/2016] amLODipine  5 mg Oral Daily  . aspirin EC  81 mg Oral Daily  . enoxaparin (LOVENOX) injection  40 mg Subcutaneous Q24H  . pantoprazole  40 mg Oral Daily  . sertraline  50 mg Oral Daily  . simvastatin  20 mg Oral Daily  . sodium chloride  1,000 mL Intravenous Once  . sodium chloride flush  3 mL Intravenous Q12H   Continuous Infusions:           Tawni Millers, MD [facility] Pager [phone]  If 7PM-7AM, please contact night-coverage [email or web address] Password [facility] 01/31/2016, 3:25 PM

## 2016-01-31 NOTE — Progress Notes (Signed)
Miralax given as order per [facility]. SRP, RN

## 2016-01-31 NOTE — Progress Notes (Signed)
MD notified  

## 2016-01-31 NOTE — Care Management Note (Signed)
Case Management Note  Patient Details  Name: [name] MRN: [id number] Date of Birth: [birth year]  Subjective/Objective: Asked to explain observation status to patient-patient voiced understanding. I asked patient if she wanted me to talk to her dtr-she said no.PT-recc HH. Provided patient w/HHC provider list-she chose [facility] in the past for her spouse. [facility] rep [place] will inform her co-worker since she is on vacation-Leisha Tripp who will get back to me about referral. Await final HHRN/HHPT,[id number] order.                   Action/Plan:d/c home w/HHC.   Expected Discharge Date:                  Expected Discharge Plan:  [name]  In-House Referral:     Discharge planning Services  CM Consult  Post Acute Care Choice:    Choice offered to:  Patient  DME Arranged:    DME Agency:     HH Arranged:    [place] Agency:     Status of Service:  In process, will continue to follow  If discussed at Long Length of Stay Meetings, dates discussed:    Additional Comments:  Dessa Phi, RN 01/31/2016, 12:45 PM

## 2016-01-31 NOTE — Progress Notes (Signed)
Physical Therapy Treatment Patient Details Name: [name] MRN: [id number] DOB: [birth year] Today's Date: 01/31/2016    History of Present Illness 82 yo female admitted with bradycardia, depression. Hx of OA, osteoporosis, spinal stenosis, depression, HTN    PT Comments    Pt demonstarted HIGH anxiety after speaking with daughter on phone. Pt was sitting EOB eating breakfast earlier with no issues.  Assisted OOB to bathroom then amb in hallway while monitoring vitals as pt was co dizziness.     Follow Up Recommendations        Equipment Recommendations       Recommendations for Other Services       Precautions / Restrictions Precautions Precautions: Fall Precaution Comments: anxiety Restrictions Weight Bearing Restrictions: No    Mobility  Bed Mobility Overal bed mobility: Modified Independent             General bed mobility comments: in and back to bed  Transfers Overall transfer level: Needs assistance Equipment used: None Transfers: Sit to/from Stand Sit to Stand: Supervision         General transfer comment: for safety  Ambulation/Gait Ambulation/Gait assistance: Min guard;Supervision Ambulation Distance (Feet): 142 Feet Assistive device: 1 person hand held assist Gait Pattern/deviations: Step-to pattern;Step-through pattern Gait velocity: WFL   General Gait Details: slow gait speed. close guard for safety. no LOB.    Stairs            Wheelchair Mobility    Modified Rankin (Stroke Patients Only)       Balance                                    Cognition Arousal/Alertness: Awake/alert Behavior During Therapy: Anxious                        Exercises      General Comments        Pertinent Vitals/Pain Pain Assessment: No/denies pain    Home Living                      Prior Function            PT Goals (current goals can now be found in the care plan section) Progress towards PT  goals: Progressing toward goals    Frequency       PT Plan      Co-evaluation             End of Session           Time: [id number] PT Time Calculation (min) (ACUTE ONLY): 16 min  Charges:  $Gait Training: 8-22 mins                    G Codes:      Rica Koyanagi  PTA [facility]  Acute  Rehab Pager      [phone]

## 2016-01-31 NOTE — Care Management Note (Signed)
Case Management Note  Patient Details  Name: [name] MRN: [id number] Date of Birth: 1923-02-15  Subjective/Objective:  Patient provided w/HHC provider list-patient chose [facility] rep Tim aware,& accepted referral-HHRN/HHPT.                  Action/Plan:d/c home w/HHC.   Expected Discharge Date:                  Expected Discharge Plan:  [place]  In-House Referral:     Discharge planning Services  CM Consult  Post Acute Care Choice:    Choice offered to:  Patient  DME Arranged:    DME Agency:     HH Arranged:  RN, PT [place] Agency:  [facility] (now [facility])  Status of Service:  In process, will continue to follow  If discussed at Long Length of Stay Meetings, dates discussed:    Additional Comments:  Dessa Phi, RN 01/31/2016, 3:11 PM

## 2016-01-31 NOTE — Progress Notes (Signed)
C/O constipation MD notified. SRP, RN

## 2016-01-31 NOTE — Consult Note (Signed)
[name]   Reason for Consult:  Depression, anxiety and bradycardia Referring Physician:  Dr. Cathlean Sauer Patient Identification: [name] MRN:  [id number] Principal Diagnosis: Depression Diagnosis:   Patient Active Problem List   Diagnosis Date Noted  . Symptomatic bradycardia [R00.1] 01/30/2016  . Bradycardia [R00.1] 01/29/2016  . Symptomatic anemia [D64.9] 01/29/2016  . Depression [F32.9]   . Hypertension [I10]   . Osteoporosis   . Hyperlipidemia [E78.5]   . GERD (gastroesophageal reflux disease) [K21.9]   . Spinal stenosis [M48.00]   . Hearing loss [H91.90] 12/08/2011  . Osteoarthritis of hip [M16.9] 12/02/2011  . Endometriosis [617]   . DIVERTICULOSIS-COLON [K57.30] 02/12/2009  . ALLERGIC RHINITIS [J30.9] 01/19/2007  . ESOPHAGEAL STRICTURE [K22.2] 01/19/2007    Total Time spent with patient: 45 minutes  Subjective:   [name] is a 82 y.o. female patient admitted with depression and anxiety.  HPI:  [name] is a 82 y.o. Female, seen and chart reviewed for the face-to-face psychiatric consultation and evaluation of increased symptoms of depression, anxiety without suicidal/homicidal ideation, intention or plans. Patient has no evidence of psychosis. Patient reported she has been sad, depressed, loss of energy, loss of motivation, low interest, isolation, withdrawn, decreased appetite, disturbed sleep for about 3-4 weeks and also extremely anxious about caring for her husband who has total care needed and running out of his finances. Patient stated that she lost her dog which is 26 years old incident of taking good care which made her depressed. Patient reported 2 out of the 4 children were died several years ago. Patient husband has been suffering with polio. Patient worried about long-term care which he canceled 3 years ago. Patient stated that she has been receiving outpatient psychiatric medication management for depression from primary care  physician who given a trial of Lexapro and BuSpar without much help so changed to Ativan or Klonopin and Zoloft. Patient was initially started with a 25 mg which was titrated up to 50 mg at current time. Reportedly she has been taking 50 mg only for this. Patient could not tell if she has any clinical response to her current medication regimen. Patient was referred to [facility] behavioral health, where she was found with bradycardia recommended emergency evaluation. Patient endorses feeling better and relaxed while in the hospital because she is out of stressful environment from home.  Patient is seeking partial hospitalization at [facility] and also outpatient medication management with the geriatric psychiatry Dr. Adele Schilder.  Please read the following information for additional information: Pt was seeking geratic psychiatrist for medication management, but she could not find one that can take her and her insurance currently. The first available geriatric psychiatry appointments availalbe (per daughter/POA) is in 3 months. Pt is living with her husband, who has dementia. Caregivers comes to home everyday to take care of pt's husband but pt also needs to take care of him. Pt has two kids that died from diabetes and overdose of Cocaine at their ages of 66 and 21. Pt reports that she used to feel "waves of grief" but now she feels nothing. Pt reports no SI/HI/Psychosis. Patient report a prior history of ECT treatments in her 74's. Pt was referred and scheduled [facility] starting on 02/04/2016. Daughter (POA) to assist patient in arranging transportation to treatment. Patient and daughter informed of program attendance expectations.   Medical history: Patient with medical history significant of seasonal allergies, GERD, osteoarthritis, osteoporosis, hyperlipidemia, spinal stenosis, depression, hypertension treated  with Cardizem who was seen in the emergency department on Saturday due to anxiety and  depression.Today the patient was at [facility] behavioral health services for psych assessment, when she was noticed to be bradycardic in the 40s and was referred to the emergency department. She denies chest pain, palpitations, diaphoresis, PND, orthopnea or pitting edema of the lower extremities. She states that recently she has been feeling postural dizziness when standing or sitting up. She also mentions that she has been feeling anxious and depressed. She has lost 2 of her grandchildren in the past and recently her dog died. She also feels overwhelmed at times, since she is taking care for her elderly husband who suffered from polio at age 89. She denies suicidal or homicidal ideations.  Past Psychiatric History: History of past psychiatric history significant reportedly had a CVA or nervous breakdown at age 75's. required ECT treatments while living in [place].  Risk to Self: Is patient at risk for suicide?: No Risk to Others:   Prior Inpatient Therapy:   Prior Outpatient Therapy:    Past Medical History:  Past Medical History  Diagnosis Date  . Allergy   . GERD (gastroesophageal reflux disease)   . Arthritis   . Hyperlipidemia   . Spinal stenosis   . Ovarian cyst   . Hearing loss 12-08-11    bil  . S/P right THA, AA 12/16/2011  . Depression   . Endometriosis   . Hypertension   . Osteoporosis     Past Surgical History  Procedure Laterality Date  . Hernia repair    . Oophorectomy      bilateral  . Hysteroscopy    . Dilation and curettage of uterus    . Tibia fracture surgery      trauma  . Cataract extraction, bilateral  12-08-11    bil  . Steriod injection  12/09/11    to spine for rt calf pain and spasms  . Total hip arthroplasty  12/16/2011    Procedure: TOTAL HIP ARTHROPLASTY ANTERIOR APPROACH;  Surgeon: Mauri Pole, MD;  Location: [facility]S;  Service: Orthopedics;  Laterality: Right;   Family History:  Family History  Problem Relation Age of Onset  . Cancer Father      esophogeal  . Stroke Sister   . Diabetes Daughter   . Cancer Paternal Aunt     stomach  . Breast cancer Maternal Grandmother     Age 61   Family Psychiatric  History: Family history of unknown mental illness and substance abuse and her children. One of her son died with heroin overdose several years ago.  Social History:  History  Alcohol Use  . 0.5 oz/week  . 1 drink(s) per week     History  Drug Use No    Social History   Social History  . Marital Status: Married    Spouse Name: N/A  . Number of Children: N/A  . Years of Education: N/A   Social History Main Topics  . Smoking status: Former Research scientist (life sciences)  . Smokeless tobacco: None  . Alcohol Use: 0.5 oz/week    1 drink(s) per week  . Drug Use: No  . Sexual Activity: No   Other Topics Concern  . None   Social History Narrative   Additional Social History:    Allergies:   Allergies  Allergen Reactions  . Lisinopril-Hydrochlorothiazide Cough  . Mercury Rash  . Codeine Other (See Comments)    fainted  . Sulfonamide Derivatives Rash  Labs:  Results for orders placed or performed during the hospital encounter of 01/29/16 (from the past 48 hour(s))  I-stat troponin, ED     Status: None   Collection Time: 01/29/16  9:14 PM  Result Value Ref Range   Troponin i, poc 0.00 0.00 - 0.08 ng/mL   Comment 3            Comment: Due to the release kinetics of cTnI, a negative result within the first hours of the onset of symptoms does not rule out myocardial infarction with certainty. If myocardial infarction is still suspected, repeat the test at appropriate intervals.   I-stat Chem 8, ED     Status: Abnormal   Collection Time: 01/29/16  9:16 PM  Result Value Ref Range   Sodium 137 135 - 145 mmol/L   Potassium 3.8 3.5 - 5.1 mmol/L   Chloride 98 (L) 101 - 111 mmol/L   BUN 17 6 - 20 mg/dL   Creatinine, Ser 0.80 0.44 - 1.00 mg/dL   Glucose, Bld 96 65 - 99 mg/dL   Calcium, Ion 1.11 (L) 1.12 - 1.23 mmol/L   TCO2 28 0  - 100 mmol/L   Hemoglobin 13.3 12.0 - 15.0 g/dL   HCT 39.0 36.0 - 46.0 %  Urinalysis, Routine w reflex microscopic     Status: None   Collection Time: 01/29/16  9:30 PM  Result Value Ref Range   Color, Urine YELLOW YELLOW   APPearance CLEAR CLEAR   Specific Gravity, Urine 1.016 1.005 - 1.030   pH 6.5 5.0 - 8.0   Glucose, UA NEGATIVE NEGATIVE mg/dL   Hgb urine dipstick NEGATIVE NEGATIVE   Bilirubin Urine NEGATIVE NEGATIVE   Ketones, ur NEGATIVE NEGATIVE mg/dL   Protein, ur NEGATIVE NEGATIVE mg/dL   Nitrite NEGATIVE NEGATIVE   Leukocytes, UA NEGATIVE NEGATIVE    Comment: MICROSCOPIC NOT DONE ON URINES WITH NEGATIVE PROTEIN, BLOOD, LEUKOCYTES, NITRITE, OR GLUCOSE <1000 mg/dL.  TSH     Status: None   Collection Time: 01/29/16 11:51 PM  Result Value Ref Range   TSH 1.503 0.350 - 4.500 uIU/mL  Magnesium     Status: None   Collection Time: 01/29/16 11:51 PM  Result Value Ref Range   Magnesium 2.0 1.7 - 2.4 mg/dL  CBC     Status: None   Collection Time: 01/29/16 11:51 PM  Result Value Ref Range   WBC 6.8 4.0 - 10.5 K/uL   RBC 4.01 3.87 - 5.11 MIL/uL   Hemoglobin 12.3 12.0 - 15.0 g/dL   HCT 37.5 36.0 - 46.0 %   MCV 93.5 78.0 - 100.0 fL   MCH 30.7 26.0 - 34.0 pg   MCHC 32.8 30.0 - 36.0 g/dL   RDW 12.0 11.5 - 15.5 %   Platelets 203 150 - 400 K/uL  MRSA PCR Screening     Status: None   Collection Time: 01/30/16  1:18 AM  Result Value Ref Range   MRSA by PCR NEGATIVE NEGATIVE    Comment:        The GeneXpert MRSA Assay (FDA approved for NASAL specimens only), is one component of a comprehensive MRSA colonization surveillance program. It is not intended to diagnose MRSA infection nor to guide or monitor treatment for MRSA infections.   CBC     Status: Abnormal   Collection Time: 01/30/16  3:09 AM  Result Value Ref Range   WBC 8.2 4.0 - 10.5 K/uL   RBC 3.69 (L) 3.87 - 5.11 MIL/uL  Hemoglobin 11.6 (L) 12.0 - 15.0 g/dL   HCT 34.6 (L) 36.0 - 46.0 %   MCV 93.8 78.0 - 100.0  fL   MCH 31.4 26.0 - 34.0 pg   MCHC 33.5 30.0 - 36.0 g/dL   RDW 12.1 11.5 - 15.5 %   Platelets 193 150 - 400 K/uL  Creatinine, serum     Status: None   Collection Time: 01/30/16  3:09 AM  Result Value Ref Range   Creatinine, Ser 0.63 0.44 - 1.00 mg/dL   GFR calc non Af Amer >60 >60 mL/min   GFR calc Af Amer >60 >60 mL/min    Comment: (NOTE) The eGFR has been calculated using the CKD EPI equation. This calculation has not been validated in all clinical situations. eGFR's persistently <60 mL/min signify possible Chronic Kidney Disease.   Troponin I     Status: None   Collection Time: 01/30/16  3:09 AM  Result Value Ref Range   Troponin I <0.03 <0.03 ng/mL  Troponin I     Status: None   Collection Time: 01/30/16  9:03 AM  Result Value Ref Range   Troponin I <0.03 <0.03 ng/mL    Current Facility-Administered Medications  Medication Dose Route Frequency Provider Last Rate Last Dose  . acetaminophen (TYLENOL) tablet 650 mg  650 mg Oral Q6H PRN Reubin Milan, MD      . amLODipine (NORVASC) tablet 10 mg  10 mg Oral Daily Lily Kocher, MD      . aspirin EC tablet 81 mg  81 mg Oral Daily Reubin Milan, MD   81 mg at 01/30/16 0925  . clonazePAM (KLONOPIN) tablet 0.5 mg  0.5 mg Oral BID PRN Reubin Milan, MD   0.5 mg at 01/30/16 2359  . enoxaparin (LOVENOX) injection 40 mg  40 mg Subcutaneous Q24H Reubin Milan, MD   40 mg at 01/30/16 9169  . feeding supplement (ENSURE ENLIVE) (ENSURE ENLIVE) liquid 237 mL  237 mL Oral Daily PRN Lily Kocher, MD      . hydrALAZINE (APRESOLINE) tablet 25 mg  25 mg Oral Q8H Lily Kocher, MD   25 mg at 01/31/16 0559  . ondansetron (ZOFRAN) tablet 2-4 mg  2-4 mg Oral Q8H PRN Reubin Milan, MD      . pantoprazole (PROTONIX) EC tablet 40 mg  40 mg Oral Daily Reubin Milan, MD   40 mg at 01/30/16 0925  . sertraline (ZOLOFT) tablet 50 mg  50 mg Oral Daily Reubin Milan, MD   50 mg at 01/30/16 0925  . simvastatin (ZOCOR) tablet 20  mg  20 mg Oral Daily Reubin Milan, MD   20 mg at 01/30/16 1723  . sodium chloride flush (NS) 0.9 % injection 3 mL  3 mL Intravenous Q12H Reubin Milan, MD   3 mL at 01/30/16 2014    Musculoskeletal: Strength & Muscle Tone: decreased Gait & Station: unable to stand Patient leans: N/A  Psychiatric Specialty Exam: Physical Exam as per history and physical   ROS bradycardia, low energy, anxiety, depression, worried about going back home because of multiple stresses especially related to complete care for her husband.  No Fever-chills, No Headache, No changes with Vision or hearing, reports vertigo No problems swallowing food or Liquids, No Chest pain, Cough or Shortness of Breath, No Abdominal pain, No Nausea or Vommitting, Bowel movements are regular, No Blood in stool or Urine, No dysuria, No new skin rashes or bruises, No new joints  pains-aches,  No new weakness, tingling, numbness in any extremity, No recent weight gain or loss, No polyuria, polydypsia or polyphagia,   A full 10 point Review of Systems was done, except as stated above, all other Review of Systems were negative.  Blood pressure 167/77, pulse 43, temperature 98.2 F (36.8 C), temperature source Oral, resp. rate 18, height 5' 3"  (1.6 m), weight 64.501 kg (142 lb 3.2 oz), SpO2 97 %.Body mass index is 25.2 kg/(m^2).  General Appearance: Casual  Eye Contact:  Good  Speech:  Clear and Coherent  Volume:  Decreased  Mood:  Anxious and Depressed  Affect:  Constricted and Depressed  Thought Process:  Coherent and Goal Directed  Orientation:  Full (Time, Place, and Person)  Thought Content:  Rumination  Suicidal Thoughts:  No  Homicidal Thoughts:  No  Memory:  Immediate;   Good Recent;   Fair Remote;   Fair  Judgement:  Intact  Insight:  Fair  Psychomotor Activity:  Restlessness  Concentration:  Concentration: Fair and Attention Span: Fair  Recall:  Good  Fund of Knowledge:  Good  Language:  Good   Akathisia:  Negative  Handed:  Right  AIMS (if indicated):     Assets:  Communication Skills Desire for Improvement Financial Resources/Insurance Housing Leisure Time Resilience Talents/Skills Transportation  ADL's:  Impaired  Cognition:  WNL  Sleep:        Treatment Plan Summary: Patient has been suffering with increased symptoms of depression and anxiety secondary to multiple psychosocial stressors and poor response to current medication management. Patient does not meet criteria for acute inpatient psychiatric hospitalization and patient will be referred to the partial psychiatric hospitalization and outpatient medication management with the geriatric psychiatry    Patient has no safety concerns and has not required safety sitter as patient contract for safety   Patient does not want to be placed in geriatric psychiatry unit away from [facility] and at the same time feels dreadful to go home because extremely stressful situation history of having home health care from [facility] and full-time helper.   Patient does not want her husband to go skilled nursing facility as she does not believe the facilities can care for him and also concerned about financial difficulties about the care  Maj. depression: Zoloft 50 mg was taken only 4 days needed more time to work. Patient may able to take up to 100 mg of Zoloft, with 25 mg increments every 2 weeks as clinically required and tolerated.  Anxiety: We'll continue clonazepam 0.5 mg twice daily as needed if it makes her more sedated may use Xanax 0.25 mg twice daily. Reportedly BuSpar did not help her anxiety  Bradycardia: Patient has been waiting for cardiology consult and completed echocardiogram and EKG.  Referred to the psychiatric social service to contact patient daughter Shelbey Spindler, patient medical care power of attorney for collateral information.   Disposition: Patient will be referred to the partial psychiatric  hospitalization and outpatient medication management at behavioral [place] when medically stable Patient does not meet criteria for psychiatric inpatient admission. Supportive therapy provided about ongoing stressors.  Ambrose Finland, MD 01/31/2016 9:13 AM

## 2016-01-31 NOTE — Consult Note (Signed)
CONSULTATION NOTE  Reason for Consult: Bradycardia, depression, fatigue  Requesting Physician: Dr. Eulas Post  Cardiologist: None (new)  HPI: This is a 82 y.o. female with a past medical history significant for arthritis, hyperlipidemia, hearing loss, status post right total hip arthroplasty, depression, anxiety and history of murmur in the past. No known coronary artery disease. She is originally from [place] and is heavily involved in activities in the [place] area. She is also caring for her 20 year old husband who has congestive heart failure. She presents to [facility] with fatigue, anxiety and depression. She noted over the past several weeks to months that she's had some worsening depression and anxiety. She has seen a number of specialists and her primary care provider who had put her on different medications for depression and anxiety including benzodiazepines. She was then referred to see a psychiatrist who adjusted her medications. During this period of time she had felt increasingly fatigued. She apparently had some mild chest pain during her psychiatry visit and her pulse was noted to be in the low 40s and she was referred to the emergency department. Denies any syncopal events but did have a fall several days ago which, with a mild head laceration. On admission she was noted to be bradycardic with a heart rate in the low 40s. She was noted to be on diltiazem. This was discontinued. An echocardiogram was performed yesterday. The results indicated an LVEF of 65-70%, moderate concentric LVH, grade 1 diastolic dysfunction, there was a calcified aortic valve which was trileaflet and noted to have mild aortic stenosis, there was mild MR, moderate TR and an increased RVSP of 41 mmHg. Her diltiazem was discontinued and switched to Norvasc. She was also placed on hydralazine for additional blood pressure control. Blood pressures on admission were very elevated at 976-734 systolic.  Today however, she complained of dizziness when standing and was noted to be orthostatic with a supine blood pressure in the 193-790 systolic range and a standing systolic blood pressure in the 70s. Labs were reviewed and demonstrate 3 negative troponins. Labs otherwise were unremarkable with normal serum creatinine and normal sodium and chloride.  PMHx:  Past Medical History  Diagnosis Date  . Allergy   . GERD (gastroesophageal reflux disease)   . Arthritis   . Hyperlipidemia   . Spinal stenosis   . Ovarian cyst   . Hearing loss 12-08-11    bil  . S/P right THA, AA 12/16/2011  . Depression   . Endometriosis   . Hypertension   . Osteoporosis    Past Surgical History  Procedure Laterality Date  . Hernia repair    . Oophorectomy      bilateral  . Hysteroscopy    . Dilation and curettage of uterus    . Tibia fracture surgery      trauma  . Cataract extraction, bilateral  12-08-11    bil  . Steriod injection  12/09/11    to spine for rt calf pain and spasms  . Total hip arthroplasty  12/16/2011    Procedure: TOTAL HIP ARTHROPLASTY ANTERIOR APPROACH;  Surgeon: Mauri Pole, MD;  Location: [facility];  Service: Orthopedics;  Laterality: Right;    FAMHx: Family History  Problem Relation Age of Onset  . Cancer Father     esophogeal  . Stroke Sister   . Diabetes Daughter   . Cancer Paternal Aunt     stomach  . Breast cancer Maternal Grandmother     Age 61  SOCHx:  reports that she has quit smoking. She does not have any smokeless tobacco history on file. She reports that she drinks about 0.5 oz of alcohol per week. She reports that she does not use illicit drugs.  ALLERGIES: Allergies  Allergen Reactions  . Lisinopril-Hydrochlorothiazide Cough  . Mercury Rash  . Codeine Other (See Comments)    fainted  . Sulfonamide Derivatives Rash    ROS: Pertinent items noted in HPI and remainder of comprehensive ROS otherwise negative.  HOME MEDICATIONS:   Medication List      ASK your doctor about these medications        acetaminophen 325 MG tablet  Commonly known as:  TYLENOL  Take 650 mg by mouth every 4 (four) hours as needed for mild pain, moderate pain, fever or headache.     aspirin EC 81 MG tablet  Take 81 mg by mouth daily.     cholecalciferol 1000 units tablet  Commonly known as:  VITAMIN D  Take 2,000 Units by mouth daily.     clonazePAM 1 MG tablet  Commonly known as:  KLONOPIN  Take 0.5-1 mg by mouth 2 (two) times daily as needed for anxiety.     diltiazem 180 MG 24 hr capsule  Commonly known as:  CARDIZEM CD  TAKE AS DIRECTED     fluticasone 50 MCG/ACT nasal spray  Commonly known as:  FLONASE  Place 2 sprays into both nostrils daily.     multivitamin tablet  Take 1 tablet by mouth daily.     omeprazole 20 MG capsule  Commonly known as:  PRILOSEC  Take 1 capsule (20 mg total) by mouth daily.     ondansetron 4 MG tablet  Commonly known as:  ZOFRAN  Take 2-4 mg by mouth every 8 (eight) hours as needed for nausea or vomiting.     sertraline 25 MG tablet  Commonly known as:  ZOLOFT  Take 50 mg by mouth daily.     simvastatin 20 MG tablet  Commonly known as:  ZOCOR  TAKE 1 TABLET EVERY DAY     triamcinolone cream 0.1 %  Commonly known as:  KENALOG  Place 1 application vaginally 2 (two) times daily as needed (for itching).        HOSPITAL MEDICATIONS: I have reviewed the patient's current medications. Prior to Admission:  Prescriptions prior to admission  Medication Sig Dispense Refill Last Dose  . acetaminophen (TYLENOL) 325 MG tablet Take 650 mg by mouth every 4 (four) hours as needed for mild pain, moderate pain, fever or headache.    01/28/2016 at Unknown time  . aspirin EC 81 MG tablet Take 81 mg by mouth daily.   01/29/2016 at 1000  . cholecalciferol (VITAMIN D) 1000 UNITS tablet Take 2,000 Units by mouth daily.    01/29/2016 at Unknown time  . clonazePAM (KLONOPIN) 1 MG tablet Take 0.5-1 mg by mouth 2 (two) times  daily as needed for anxiety.   01/29/2016 at 1300  . diltiazem (CARDIZEM CD) 180 MG 24 hr capsule TAKE AS DIRECTED (Patient taking differently: Takes 126m by mouth every morning) 30 capsule 0 01/29/2016 at Unknown time  . fluticasone (FLONASE) 50 MCG/ACT nasal spray Place 2 sprays into both nostrils daily.   01/28/2016 at Unknown time  . Multiple Vitamin (MULTIVITAMIN) tablet Take 1 tablet by mouth daily.     01/29/2016 at Unknown time  . omeprazole (PRILOSEC) 20 MG capsule Take 1 capsule (20 mg total) by mouth daily. 9Simpson  capsule 0 Past Week at Unknown time  . ondansetron (ZOFRAN) 4 MG tablet Take 2-4 mg by mouth every 8 (eight) hours as needed for nausea or vomiting.   01/29/2016 at Unknown time  . sertraline (ZOLOFT) 25 MG tablet Take 50 mg by mouth daily.   01/28/2016 at Unknown time  . simvastatin (ZOCOR) 20 MG tablet TAKE 1 TABLET EVERY DAY (Patient taking differently: TAKE 20 MG BY MOUTH EVERY EVENING) 90 tablet 0 01/28/2016 at Unknown time  . triamcinolone cream (KENALOG) 0.1 % Place 1 application vaginally 2 (two) times daily as needed (for itching).   Past Month at Unknown time    VITALS: Blood pressure 167/77, pulse 43, temperature 98.3 F (36.8 C), temperature source Oral, resp. rate 18, height 5' 3"  (1.6 m), weight 142 lb 3.2 oz (64.501 kg), SpO2 97 %.  PHYSICAL EXAM: General appearance: alert, appears stated age and no distress Neck: no carotid bruit and no JVD Lungs: clear to auscultation bilaterally Heart: regular rate and rhythm, S1, S2 normal and systolic murmur: early systolic 3/6, crescendo at 2nd right intercostal space Abdomen: soft, non-tender; bowel sounds normal; no masses,  no organomegaly Extremities: extremities normal, atraumatic, no cyanosis or edema Pulses: 2+ and symmetric Skin: Skin color, texture, turgor normal. No rashes or lesions Neurologic: Grossly normal Psych: Mildly anxious  LABS: Results for orders placed or performed during the hospital encounter of  01/29/16 (from the past 48 hour(s))  I-stat troponin, ED     Status: None   Collection Time: 01/29/16  9:14 PM  Result Value Ref Range   Troponin i, poc 0.00 0.00 - 0.08 ng/mL   Comment 3            Comment: Due to the release kinetics of cTnI, a negative result within the first hours of the onset of symptoms does not rule out myocardial infarction with certainty. If myocardial infarction is still suspected, repeat the test at appropriate intervals.   I-stat Chem 8, ED     Status: Abnormal   Collection Time: 01/29/16  9:16 PM  Result Value Ref Range   Sodium 137 135 - 145 mmol/L   Potassium 3.8 3.5 - 5.1 mmol/L   Chloride 98 (L) 101 - 111 mmol/L   BUN 17 6 - 20 mg/dL   Creatinine, Ser 0.80 0.44 - 1.00 mg/dL   Glucose, Bld 96 65 - 99 mg/dL   Calcium, Ion 1.11 (L) 1.12 - 1.23 mmol/L   TCO2 28 0 - 100 mmol/L   Hemoglobin 13.3 12.0 - 15.0 g/dL   HCT 39.0 36.0 - 46.0 %  Urinalysis, Routine w reflex microscopic     Status: None   Collection Time: 01/29/16  9:30 PM  Result Value Ref Range   Color, Urine YELLOW YELLOW   APPearance CLEAR CLEAR   Specific Gravity, Urine 1.016 1.005 - 1.030   pH 6.5 5.0 - 8.0   Glucose, UA NEGATIVE NEGATIVE mg/dL   Hgb urine dipstick NEGATIVE NEGATIVE   Bilirubin Urine NEGATIVE NEGATIVE   Ketones, ur NEGATIVE NEGATIVE mg/dL   Protein, ur NEGATIVE NEGATIVE mg/dL   Nitrite NEGATIVE NEGATIVE   Leukocytes, UA NEGATIVE NEGATIVE    Comment: MICROSCOPIC NOT DONE ON URINES WITH NEGATIVE PROTEIN, BLOOD, LEUKOCYTES, NITRITE, OR GLUCOSE <1000 mg/dL.  TSH     Status: None   Collection Time: 01/29/16 11:51 PM  Result Value Ref Range   TSH 1.503 0.350 - 4.500 uIU/mL  Magnesium     Status: None   Collection  Time: 01/29/16 11:51 PM  Result Value Ref Range   Magnesium 2.0 1.7 - 2.4 mg/dL  CBC     Status: None   Collection Time: 01/29/16 11:51 PM  Result Value Ref Range   WBC 6.8 4.0 - 10.5 K/uL   RBC 4.01 3.87 - 5.11 MIL/uL   Hemoglobin 12.3 12.0 - 15.0  g/dL   HCT 37.5 36.0 - 46.0 %   MCV 93.5 78.0 - 100.0 fL   MCH 30.7 26.0 - 34.0 pg   MCHC 32.8 30.0 - 36.0 g/dL   RDW 12.0 11.5 - 15.5 %   Platelets 203 150 - 400 K/uL  MRSA PCR Screening     Status: None   Collection Time: 01/30/16  1:18 AM  Result Value Ref Range   MRSA by PCR NEGATIVE NEGATIVE    Comment:        The GeneXpert MRSA Assay (FDA approved for NASAL specimens only), is one component of a comprehensive MRSA colonization surveillance program. It is not intended to diagnose MRSA infection nor to guide or monitor treatment for MRSA infections.   CBC     Status: Abnormal   Collection Time: 01/30/16  3:09 AM  Result Value Ref Range   WBC 8.2 4.0 - 10.5 K/uL   RBC 3.69 (L) 3.87 - 5.11 MIL/uL   Hemoglobin 11.6 (L) 12.0 - 15.0 g/dL   HCT 34.6 (L) 36.0 - 46.0 %   MCV 93.8 78.0 - 100.0 fL   MCH 31.4 26.0 - 34.0 pg   MCHC 33.5 30.0 - 36.0 g/dL   RDW 12.1 11.5 - 15.5 %   Platelets 193 150 - 400 K/uL  Creatinine, serum     Status: None   Collection Time: 01/30/16  3:09 AM  Result Value Ref Range   Creatinine, Ser 0.63 0.44 - 1.00 mg/dL   GFR calc non Af Amer >60 >60 mL/min   GFR calc Af Amer >60 >60 mL/min    Comment: (NOTE) The eGFR has been calculated using the CKD EPI equation. This calculation has not been validated in all clinical situations. eGFR's persistently <60 mL/min signify possible Chronic Kidney Disease.   Troponin I     Status: None   Collection Time: 01/30/16  3:09 AM  Result Value Ref Range   Troponin I <0.03 <0.03 ng/mL  Troponin I     Status: None   Collection Time: 01/30/16  9:03 AM  Result Value Ref Range   Troponin I <0.03 <0.03 ng/mL    IMAGING: Ct Head Wo Contrast  01/29/2016  CLINICAL DATA:  Status post fall.  Anxiety and depression. EXAM: CT HEAD WITHOUT CONTRAST TECHNIQUE: Contiguous axial images were obtained from the base of the skull through the vertex without intravenous contrast. COMPARISON:  None. FINDINGS: There is no  evidence of mass effect, midline shift, or extra-axial fluid collections. There is no evidence of a space-occupying lesion or intracranial hemorrhage. There is no evidence of a cortical-based area of acute infarction. There is generalized cerebral atrophy. There is periventricular white matter low attenuation likely secondary to microangiopathy. The ventricles and sulci are appropriate for the patient's age. The basal cisterns are patent. Visualized portions of the orbits are unremarkable. The visualized portions of the paranasal sinuses and mastoid air cells are unremarkable. Cerebrovascular atherosclerotic calcifications are noted. The osseous structures are unremarkable. IMPRESSION: No acute intracranial pathology. Electronically Signed   By: Kathreen Devoid   On: 01/29/2016 22:19    HOSPITAL DIAGNOSES: Principal Problem:  Depression Active Problems:   Hypertension   Hyperlipidemia   GERD (gastroesophageal reflux disease)   Symptomatic bradycardia   IMPRESSION: 1. Symptomatic sinus bradycardia 2. Orthostatic hypotension 3. Mild aortic stenosis 4. Hypertension 5. Dyslipidemia  RECOMMENDATION: 1. Mrs. Yaun presented with fatigue, depression and anxiety and recently had a number of medication changes including some antidepressant medications which could've affected heart rate. She was also on diltiazem. As been discontinued and heart rate seems to have improved now up into the 60s. Unfortunately, she is orthostatic today and symptomatic with her current blood pressure. We discontinued her hydralazine and I will decrease amlodipine. I would recommend starting IV fluids for at least 1 L rehydration. Ins and outs indicate that she is 2 L negative since admission without any diuretic. This may help with her orthostasis.  Thanks for the consultation. Cardiology will follow closely with you.  Time Spent Directly with Patient: 45 minutes  Pixie Casino, MD, [facility] Attending Cardiologist [place] 01/31/2016, 2:40 PM

## 2016-01-31 NOTE — Progress Notes (Signed)
Spoke with daughter on the phone, daughter request to talk with Cardiology, message given to Hospitalist and will follow up. Md will call daughter to provide an update. SRP, RN

## 2016-01-31 NOTE — Progress Notes (Signed)
Dr. Debara Jaysten Essner informed of positive orthostatics BP. Pt c/o dizziness when getting up. Verbal order confirmed, MD will write orders. SRP, RN

## 2016-02-01 DIAGNOSIS — F329 Major depressive disorder, single episode, unspecified: Secondary | ICD-10-CM | POA: Insufficient documentation

## 2016-02-01 DIAGNOSIS — R001 Bradycardia, unspecified: Secondary | ICD-10-CM | POA: Insufficient documentation

## 2016-02-01 DIAGNOSIS — K5900 Constipation, unspecified: Secondary | ICD-10-CM

## 2016-02-01 DIAGNOSIS — F32A Depression, unspecified: Secondary | ICD-10-CM | POA: Insufficient documentation

## 2016-02-01 DIAGNOSIS — F332 Major depressive disorder, recurrent severe without psychotic features: Secondary | ICD-10-CM | POA: Diagnosis not present

## 2016-02-01 DIAGNOSIS — I10 Essential (primary) hypertension: Secondary | ICD-10-CM | POA: Diagnosis not present

## 2016-02-01 LAB — BASIC METABOLIC PANEL
Anion gap: 7 (ref 5–15)
BUN: 15 mg/dL (ref 6–20)
CALCIUM: 8.8 mg/dL — AB (ref 8.9–10.3)
CHLORIDE: 101 mmol/L (ref 101–111)
CO2: 28 mmol/L (ref 22–32)
CREATININE: 0.68 mg/dL (ref 0.44–1.00)
GFR calc non Af Amer: >60 mL/min (ref >60)
Glucose, Bld: 106 mg/dL — ABNORMAL HIGH (ref 65–99)
Potassium: 3.8 mmol/L (ref 3.5–5.1)
SODIUM: 136 mmol/L (ref 135–145)

## 2016-02-01 MED ORDER — BISACODYL 5 MG PO TBEC: 5.0000 mg | DELAYED_RELEASE_TABLET | Freq: Every day | ORAL | Status: DC | PRN

## 2016-02-01 MED ORDER — VITAMIN D3 25 MCG (1000 UNIT) PO TABS
2000.0000 [IU] | ORAL_TABLET | Freq: Every day | ORAL | Status: DC
  Administered 2016-02-01 – 2016-02-02 (×2): 2000 [IU] via ORAL
  Filled 2016-02-01 (×2): qty 2

## 2016-02-01 MED ORDER — CLONAZEPAM 0.5 MG PO TABS
0.5000 mg | ORAL_TABLET | Freq: Once | ORAL | Status: AC
  Administered 2016-02-01: 0.5 mg via ORAL
  Filled 2016-02-01: qty 1

## 2016-02-01 MED ORDER — SODIUM CHLORIDE 0.9 % IV SOLN
INTRAVENOUS | Status: DC
  Administered 2016-02-01 – 2016-02-02 (×3): via INTRAVENOUS

## 2016-02-01 NOTE — Consult Note (Signed)
[facility] Consult follow-up  Reason for Consult:  Depression, anxiety and bradycardia Referring Physician:  Dr. Cathlean Sauer Patient Identification: [name] MRN:  [id number] Principal Diagnosis: Symptomatic bradycardia Diagnosis:   Patient Active Problem List   Diagnosis Date Noted  . MDD (major depressive disorder), recurrent severe, without psychosis ([place]) [F33.2]   . Symptomatic bradycardia [R00.1] 01/30/2016  . Symptomatic anemia [D64.9] 01/29/2016  . Hypertension [I10]   . Osteoporosis   . Hyperlipidemia [E78.5]   . GERD (gastroesophageal reflux disease) [K21.9]   . Spinal stenosis [M48.00]   . Hearing loss [H91.90] 12/08/2011  . Osteoarthritis of hip [M16.9] 12/02/2011  . Endometriosis [617]   . DIVERTICULOSIS-COLON [K57.30] 02/12/2009  . ALLERGIC RHINITIS [J30.9] 01/19/2007  . ESOPHAGEAL STRICTURE [K22.2] 01/19/2007    Total Time spent with patient: 20 minutes  Subjective:   [name] is a 82 y.o. female patient admitted with depression and anxiety.  HPI:  [name] is a 82 y.o. Female, seen and chart reviewed for the face-to-face psychiatric consultation and evaluation of increased symptoms of depression, anxiety without suicidal/homicidal ideation, intention or plans. Patient has no evidence of psychosis. Patient reported she has been sad, depressed, loss of energy, loss of motivation, low interest, isolation, withdrawn, decreased appetite, disturbed sleep for about 3-4 weeks and also extremely anxious about caring for her husband who has total care needed and running out of his finances. Patient stated that she lost her dog which is 58 years old incident of taking good care which made her depressed. Patient reported 2 out of the 4 children were died several years ago. Patient husband has been suffering with polio. Patient worried about long-term care which he canceled 3 years ago. Patient stated that she has been receiving outpatient psychiatric  medication management for depression from primary care physician who given a trial of Lexapro and BuSpar without much help so changed to Ativan or Klonopin and Zoloft. Patient was initially started with a 25 mg which was titrated up to 50 mg at current time. Reportedly she has been taking 50 mg only for this. Patient could not tell if she has any clinical response to her current medication regimen. Patient was referred to [facility] behavioral health, where she was found with bradycardia recommended emergency evaluation. Patient endorses feeling better and relaxed while in the hospital because she is out of stressful environment from home.  Patient is seeking partial hospitalization at [facility] and also outpatient medication management with the geriatric psychiatry Dr. Adele Schilder.  Please read the following information for additional information: Pt was seeking geratic psychiatrist for medication management, but she could not find one that can take her and her insurance currently. The first available geriatric psychiatry appointments availalbe (per daughter/POA) is in 3 months. Pt is living with her husband, who has dementia. Caregivers comes to home everyday to take care of pt's husband but pt also needs to take care of him. Pt has two kids that died from diabetes and overdose of Cocaine at their ages of 9 and 43. Pt reports that she used to feel "waves of grief" but now she feels nothing. Pt reports no SI/HI/Psychosis. Patient report a prior history of ECT treatments in her 29's. Pt was referred and scheduled [facility] starting on 02/04/2016. Daughter (POA) to assist patient in arranging transportation to treatment. Patient and daughter informed of program attendance expectations.    Past Psychiatric History: History of past psychiatric history significant reportedly had a CVA or  nervous breakdown at age 28's. required ECT treatments while living in [place].  02/01/2016  Interval history:  Patient seen along with the unit LCSW for psychiatric consultation follow-up today. Case discussed with the unit staff RN who reported that patient has been constipated and working with the enema. Patient continued to report being depressed and anxious but no suicidal or homicidal ideation and contracts for safety. Patient still feels dreaded about going home cause of  stresses associated with taking care of her chronically sick husband and her worries about running out of funds. Patient and patient daughter has been appreciative of referral to the intensive outpatient program and also geriatric psychiatrist who can see less than 3 months time.  Risk to Self: Is patient at risk for suicide?: No Risk to Others:   Prior Inpatient Therapy:   Prior Outpatient Therapy:    Past Medical History:  Past Medical History  Diagnosis Date  . Allergy   . GERD (gastroesophageal reflux disease)   . Arthritis   . Hyperlipidemia   . Spinal stenosis   . Ovarian cyst   . Hearing loss 12-08-11    bil  . S/P right THA, AA 12/16/2011  . Depression   . Endometriosis   . Hypertension   . Osteoporosis     Past Surgical History  Procedure Laterality Date  . Hernia repair    . Oophorectomy      bilateral  . Hysteroscopy    . Dilation and curettage of uterus    . Tibia fracture surgery      trauma  . Cataract extraction, bilateral  12-08-11    bil  . Steriod injection  12/09/11    to spine for rt calf pain and spasms  . Total hip arthroplasty  12/16/2011    Procedure: TOTAL HIP ARTHROPLASTY ANTERIOR APPROACH;  Surgeon: Mauri Pole, MD;  Location: [facility]S;  Service: Orthopedics;  Laterality: Right;   Family History:  Family History  Problem Relation Age of Onset  . Cancer Father     esophogeal  . Stroke Sister   . Diabetes Daughter   . Cancer Paternal Aunt     stomach  . Breast cancer Maternal Grandmother     Age 80   Family Psychiatric  History: Family history of unknown mental illness and  substance abuse and her children. One of her son died with heroin overdose several years ago.  Social History:  History  Alcohol Use  . 0.5 oz/week  . 1 drink(s) per week     History  Drug Use No    Social History   Social History  . Marital Status: Married    Spouse Name: N/A  . Number of Children: N/A  . Years of Education: N/A   Social History Main Topics  . Smoking status: Former Research scientist (life sciences)  . Smokeless tobacco: None  . Alcohol Use: 0.5 oz/week    1 drink(s) per week  . Drug Use: No  . Sexual Activity: No   Other Topics Concern  . None   Social History Narrative   Additional Social History:    Allergies:   Allergies  Allergen Reactions  . Lisinopril-Hydrochlorothiazide Cough  . Mercury Rash  . Codeine Other (See Comments)    fainted  . Sulfonamide Derivatives Rash    Labs:  Results for orders placed or performed during the hospital encounter of 01/29/16 (from the past 48 hour(s))  Basic metabolic panel     Status: Abnormal   Collection Time:  02/01/16  5:00 AM  Result Value Ref Range   Sodium 136 135 - 145 mmol/L   Potassium 3.8 3.5 - 5.1 mmol/L   Chloride 101 101 - 111 mmol/L   CO2 28 22 - 32 mmol/L   Glucose, Bld 106 (H) 65 - 99 mg/dL   BUN 15 6 - 20 mg/dL   Creatinine, Ser 0.68 0.44 - 1.00 mg/dL   Calcium 8.8 (L) 8.9 - 10.3 mg/dL   GFR calc non Af Amer >60 >60 mL/min   GFR calc Af Amer >60 >60 mL/min    Comment: (NOTE) The eGFR has been calculated using the CKD EPI equation. This calculation has not been validated in all clinical situations. eGFR's persistently <60 mL/min signify possible Chronic Kidney Disease.    Anion gap 7 5 - 15    Current Facility-Administered Medications  Medication Dose Route Frequency Provider Last Rate Last Dose  . acetaminophen (TYLENOL) tablet 650 mg  650 mg Oral Q6H PRN Reubin Milan, MD      . amLODipine (NORVASC) tablet 5 mg  5 mg Oral Daily Pixie Casino, MD   5 mg at 02/01/16 0934  . aspirin EC tablet  81 mg  81 mg Oral Daily Reubin Milan, MD   81 mg at 02/01/16 0934  . clonazePAM (KLONOPIN) tablet 0.5 mg  0.5 mg Oral BID PRN Reubin Milan, MD   0.5 mg at 02/01/16 0934  . enoxaparin (LOVENOX) injection 40 mg  40 mg Subcutaneous Q24H Reubin Milan, MD   40 mg at 02/01/16 7353  . feeding supplement (ENSURE ENLIVE) (ENSURE ENLIVE) liquid 237 mL  237 mL Oral Daily PRN Lily Kocher, MD      . ondansetron [facility]) tablet 2-4 mg  2-4 mg Oral Q8H PRN Reubin Milan, MD      . pantoprazole (PROTONIX) EC tablet 40 mg  40 mg Oral Daily Reubin Milan, MD   40 mg at 02/01/16 0934  . polyethylene glycol (MIRALAX / GLYCOLAX) packet 17 g  17 g Oral BID Tawni Millers, MD   17 g at 02/01/16 0935  . sertraline (ZOLOFT) tablet 50 mg  50 mg Oral Daily Reubin Milan, MD   50 mg at 02/01/16 0934  . simvastatin (ZOCOR) tablet 20 mg  20 mg Oral Daily Reubin Milan, MD   20 mg at 01/31/16 1736  . sodium chloride flush (NS) 0.9 % injection 3 mL  3 mL Intravenous Q12H Reubin Milan, MD   3 mL at 02/01/16 1000    Musculoskeletal: Strength & Muscle Tone: decreased Gait & Station: unable to stand Patient leans: N/A  Psychiatric Specialty Exam: Physical Exam as per history and physical   ROS bradycardia, low energy, anxiety, depression, worried about going back home because of multiple stresses especially related to complete total care for her husband. Patient has been struggling with the constipation and required appropriate edema treatment.   Blood pressure 117/68, pulse 52, temperature 98.2 F (36.8 C), temperature source Oral, resp. rate 16, height 5' 3"  (1.6 m), weight 64.501 kg (142 lb 3.2 oz), SpO2 98 %.Body mass index is 25.2 kg/(m^2).  General Appearance: Casual  Eye Contact:  Good  Speech:  Clear and Coherent  Volume:  Decreased  Mood:  Anxious and Depressed  Affect:  Constricted and Depressed  Thought Process:  Coherent and Goal Directed  Orientation:  Full  (Time, Place, and Person)  Thought Content:  Rumination  Suicidal Thoughts:  No  Homicidal Thoughts:  No  Memory:  Immediate;   Good Recent;   Fair Remote;   Fair  Judgement:  Intact  Insight:  Fair  Psychomotor Activity:  Restlessness  Concentration:  Concentration: Fair and Attention Span: Fair  Recall:  Good  Fund of Knowledge:  Good  Language:  Good  Akathisia:  Negative  Handed:  Right  AIMS (if indicated):     Assets:  Communication Skills Desire for Improvement Financial Resources/Insurance Housing Leisure Time Resilience Talents/Skills Transportation  ADL's:  Impaired  Cognition:  WNL  Sleep:      Treatment Plan Summary: Patient has been suffering with increased symptoms of depression, loss of interest, low energy disturbed sleep and appetite, poor oral intake  and  excessiveanxiety secondary to multiple psychosocial stressors and poor response to current medication management. Patient does not meet criteria for acute inpatient psychiatric hospitalization and patient will be referred to the intensive outpatient program at behavioral [place] and outpatient medication management with the geriatric psychiatry.    Safety concerns:Patient has no safety concerns and has not required safety sitter as patient contract for safety   Patient does not want to be placed in geriatric psychiatry unit away from [place] and at the same time feels dreadful to go home because extremely stressful situation history of having home health care from [facility] and full-time helper.   Patient does not want her husband to go skilled nursing facility as she does not believe the facilities can care for him and also concerned about financial difficulties about the long-term care  Maj. depression: Zoloft 50 mg was taken only 4 days needed more time to work. Patient may able to take up to 100 mg of Zoloft, with 25 mg increments every 2 weeks as clinically required and tolerated.    Anxiety: Continue clonazepam 0.5 mg twice daily as needed if it makes her more sedated may use Xanax 0.25 mg twice daily. Reportedly BuSpar did not help her anxiety  Bradycardia: Patient has been waiting for cardiology consult and completed echocardiogram and EKG.  Reviewed cardiology consultation and does not believe Zoloft causing orthostatic hypotension if she cannot improve it is a worthwhile changing her medication to Cymbalta 20 - 30 mg daily  Referred to the psychiatric social service to contact patient daughter Brynlyn Dade, patient medical care power of attorney for collateral information.   Disposition: Patient will be referred to the  intensive outpatient program at behavioral [facility] and outpatient medication management at [place]  with the geriatric psychiatry when medically stable Patient does not meet criteria for psychiatric inpatient admission. Supportive therapy provided about ongoing stressors.  Ambrose Finland, MD 02/01/2016 1:32 PM

## 2016-02-01 NOTE — Progress Notes (Signed)
Subjective:  Her main complaint is constipation.  Objective:  Vital Signs in the last 24 hours: Temp:  [98 F (36.7 C)-98.3 F (36.8 C)] 98.2 F (36.8 C) (07/14 0615) Pulse Rate:  [52] 52 (07/13 2110) Resp:  [16-18] 16 (07/14 0615) BP: (117-131)/(59-68) 117/68 mmHg (07/14 0215) SpO2:  [97 %-98 %] 98 % (07/14 0615)  Intake/Output from previous day:  Intake/Output Summary (Last 24 hours) at 02/01/16 0852 Last data filed at 02/01/16 0625  Gross per 24 hour  Intake 1015.9 ml  Output    800 ml  Net  215.9 ml    Physical Exam: General appearance: alert, cooperative and no distress Neck: no carotid bruit and no JVD Lungs: clear to auscultation bilaterally Heart: regular rate and rhythm and 2/6 systolic murmur AOV, preserved S2 Extremities: no edema Skin: Skin color, texture, turgor normal. No rashes or lesions Neurologic: Grossly normal   Rate: 48  Rhythm: sinus bradycardia  Lab Results:  Recent Labs  01/29/16 2351 01/30/16 0309  WBC 6.8 8.2  HGB 12.3 11.6*  PLT 203 193    Recent Labs  01/29/16 2116 01/30/16 0309 02/01/16 0500  NA 137  --  136  K 3.8  --  3.8  CL 98*  --  101  CO2  --   --  28  GLUCOSE 96  --  106*  BUN 17  --  15  CREATININE 0.80 0.63 0.68    Recent Labs  01/30/16 0309 01/30/16 0903  TROPONINI <0.03 <0.03   No results for input(s): INR in the last 72 hours.  Scheduled Meds: . amLODipine  5 mg Oral Daily  . aspirin EC  81 mg Oral Daily  . enoxaparin (LOVENOX) injection  40 mg Subcutaneous Q24H  . pantoprazole  40 mg Oral Daily  . polyethylene glycol  17 g Oral BID  . sertraline  50 mg Oral Daily  . simvastatin  20 mg Oral Daily  . sodium chloride flush  3 mL Intravenous Q12H   Continuous Infusions:  PRN Meds:.acetaminophen, clonazePAM, feeding supplement (ENSURE ENLIVE), ondansetron   Imaging: Imaging results have been reviewed  Cardiac Studies: Echo 01/30/16 Study Conclusions  - Left ventricle: The cavity size  was normal. There was moderate  concentric hypertrophy. Systolic function was vigorous. The  estimated ejection fraction was in the range of 65% to 70%. Wall  motion was normal; there were no regional wall motion  abnormalities. Doppler parameters are consistent with abnormal  left ventricular relaxation (grade 1 diastolic dysfunction).  Doppler parameters are consistent with elevated ventricular  end-diastolic filling pressure. - Aortic valve: Trileaflet; severely thickened, severely calcified  leaflets. Valve mobility was restricted. There was mild stenosis.  Mean gradient (S): 12 mm Hg. Peak gradient (S): 21 mm Hg. Valve  area (VTI): 1.98 cm^2. Valve area (Vmax): 2.03 cm^2. Valve area  (Vmean): 1.91 cm^2. - Aortic root: The aortic root was normal in size. - Ascending aorta: The ascending aorta was normal in size. - Mitral valve: Calcified annulus. Mildly thickened leaflets .  There was mild regurgitation. Valve area by continuity equation  (using LVOT flow): 2.21 cm^2. - Left atrium: The atrium was mildly dilated. - Right ventricle: Systolic function was normal. - Right atrium: The atrium was moderately dilated. - Tricuspid valve: There was moderate regurgitation. - Pulmonic valve: There was trivial regurgitation. - Pulmonary arteries: Systolic pressure was mildly increased. PA  peak pressure: 41 mm Hg (S). - Inferior vena cava: The vessel was normal in  size. - Pericardium, extracardiac: There was no pericardial effusion.   Assessment/Plan:  82 y.o. female (will be 37 8/5) with a past medical history significant for HTN, HLD, and depression and anxiety, admitted from [facility] with bradycardia. Her Diltiazem was stopped. Her HR has not gone lower than 48 and she does not seem to be symptomatic with this.   Principal Problem:   Symptomatic bradycardia Active Problems:   MDD (major depressive disorder), recurrent severe, without psychosis ([place])    Hypertension   Hyperlipidemia   GERD (gastroesophageal reflux disease)   PLAN: We would have hopped her HR would have increased more off Diltiazem but will continue to observe. Her main complaint is constipation- (I would not use an enema). I encouraged her to ambulate.   [employer] PA-C 02/01/2016, 8:52 AM [phone]

## 2016-02-01 NOTE — Progress Notes (Signed)
PROGRESS NOTE    [name]  [id number] DOB: 1922-09-07 DOA: 01/29/2016 PCP: [name], MD   Brief Narrative: 82 yo woman with bradycardia. Recent anxiety and depression. Admits that she has tried multiple medications in the past couple of months.   Assessment & Plan:   Principal Problem:   Symptomatic bradycardia Active Problems:   Hypertension   Hyperlipidemia   GERD (gastroesophageal reflux disease)   MDD (major depressive disorder), recurrent severe, without psychosis ([place])  1. Cardiovascular. Bradycardia. Will continue to hold on av blockade, ekg personally reviewed noted sinus bradycardia. Patient with persistent bradycardia probably symptomatic. HR down to 40's. Possible chronotropic incompetence, follow HR on ambulation.   Dehydration with orthostasis. Perssisetent orthostatic hypotension with blood pressure systolic down to 92 when standing. HTN Blood pressure systolic [id number] to [id number], will continue amlodipine for now.   2. Pulmonary. No signs of volume overload or pulmonary infection, will continue to monitor oxymetry and as needed supplemental 02 per [place].   3. Nephrology. Renal function with cr at 0,68 and K at 3,8. Will follow renal panel in am. Will avoid hypotension and nephrotoxic medications.  4. Neurology. Depression, Will continue sertraline, clonazepam. Noted to be more severe today, will consult psych for recommendations.  5. Gastroenterology. Constipation with fecal impaction. Continue bowel regimen, will add dulcolax, patient had des-impaction noted hard stools.      DVT prophylaxis: Lovenox Code Status: FULL Family Communication: Patient alone. Disposition Plan: Home when ready.  Consultants:   cardiology  Procedures:   Antimicrobials:  Subjective: Patient feeling very tired and decrease level of energy. No chest pain or dyspnea, no nausea or vomiting.   Objective: Filed Vitals:   01/31/16 1424 01/31/16 2110 02/01/16 0215 02/01/16 0615    BP:  131/59 117/68   Pulse:  52    Temp: 98.3 F (36.8 C) 98 F (36.7 C)  98.2 F (36.8 C)  TempSrc: Oral Oral  Oral  Resp:  18  16  Height:      Weight:      SpO2: 97% 98%  98%    Intake/Output Summary (Last 24 hours) at 02/01/16 1308 Last data filed at 02/01/16 1000  Gross per 24 hour  Intake  895.9 ml  Output    625 ml  Net  270.9 ml   Filed Weights   01/30/16 0022  Weight: 64.501 kg (142 lb 3.2 oz)    Examination:  General exam: deconditioned E ENT: mild conjunctival pallor, oral mucosa moist.   Respiratory system: Respiratory effort normal. No wheezing, rales or rhonchi. Decreased breath sounds at bases, with poor inspiratory effort. Cardiovascular system: S1 & S2 heard, bradycardic. RRR. No JVD, murmurs, rubs, gallops or clicks. No pedal edema. Gastrointestinal system: Abdomen is nondistended, soft and nontender. No organomegaly or masses felt. Normal bowel sounds heard. Central nervous system: Alert and oriented. No focal neurological deficits. Extremities: Symmetric 5 x 5 power. Skin: No rashes, lesions or ulcers Psychiatry: depressed mood.  Data Reviewed: I have personally reviewed following labs and imaging studies  CBC:  Recent Labs Lab 01/29/16 2116 01/29/16 2351 01/30/16 0309  WBC  --  6.8 8.2  HGB 13.3 12.3 11.6*  HCT 39.0 37.5 34.6*  MCV  --  93.5 93.8  PLT  --  203 0000000   Basic Metabolic Panel:  Recent Labs Lab 01/29/16 2116 01/29/16 2351 01/30/16 0309 02/01/16 0500  NA 137  --   --  136  K 3.8  --   --  3.8  CL 98*  --   --  101  CO2  --   --   --  28  GLUCOSE 96  --   --  106*  BUN 17  --   --  15  CREATININE 0.80  --  0.63 0.68  CALCIUM  --   --   --  8.8*  MG  --  2.0  --   --    GFR: Estimated Creatinine Clearance: 42.2 mL/min (by C-G formula based on Cr of 0.68). Liver Function Tests: No results for input(s): AST, ALT, ALKPHOS, BILITOT, PROT, ALBUMIN in the last 168 hours. No results for input(s): LIPASE, AMYLASE in the  last 168 hours. No results for input(s): AMMONIA in the last 168 hours. Coagulation Profile: No results for input(s): INR, PROTIME in the last 168 hours. Cardiac Enzymes:  Recent Labs Lab 01/30/16 0309 01/30/16 0903  TROPONINI <0.03 <0.03   BNP (last 3 results) No results for input(s): PROBNP in the last 8760 hours. HbA1C: No results for input(s): HGBA1C in the last 72 hours. CBG: No results for input(s): GLUCAP in the last 168 hours. Lipid Profile: No results for input(s): CHOL, HDL, LDLCALC, TRIG, CHOLHDL, LDLDIRECT in the last 72 hours. Thyroid Function Tests:  Recent Labs  01/29/16 2351  TSH 1.503   Anemia Panel: No results for input(s): VITAMINB12, FOLATE, FERRITIN, TIBC, IRON, RETICCTPCT in the last 72 hours. Sepsis Labs: No results for input(s): PROCALCITON, LATICACIDVEN in the last 168 hours.  Recent Results (from the past 240 hour(s))  MRSA PCR Screening     Status: None   Collection Time: 01/30/16  1:18 AM  Result Value Ref Range Status   MRSA by PCR NEGATIVE NEGATIVE Final    Comment:        The GeneXpert MRSA Assay (FDA approved for NASAL specimens only), is one component of a comprehensive MRSA colonization surveillance program. It is not intended to diagnose MRSA infection nor to guide or monitor treatment for MRSA infections.          Radiology Studies: No results found.      Scheduled Meds: . amLODipine  5 mg Oral Daily  . aspirin EC  81 mg Oral Daily  . enoxaparin (LOVENOX) injection  40 mg Subcutaneous Q24H  . pantoprazole  40 mg Oral Daily  . polyethylene glycol  17 g Oral BID  . sertraline  50 mg Oral Daily  . simvastatin  20 mg Oral Daily  . sodium chloride flush  3 mL Intravenous Q12H   Continuous Infusions:           Tawni Millers, MD [facility] Pager [phone]  If 7PM-7AM, please contact night-coverage [email or web address] Password [email or web address] 02/01/2016, 1:08 PM

## 2016-02-02 DIAGNOSIS — R001 Bradycardia, unspecified: Secondary | ICD-10-CM | POA: Diagnosis not present

## 2016-02-02 LAB — CBC WITH DIFFERENTIAL/PLATELET
BASOS ABS: 0 10*3/uL (ref 0.0–0.1)
BASOS PCT: 0 %
Eosinophils Absolute: 0.3 10*3/uL (ref 0.0–0.7)
Eosinophils Relative: 3 %
HEMATOCRIT: 35.2 % — AB (ref 36.0–46.0)
HEMOGLOBIN: 11.5 g/dL — AB (ref 12.0–15.0)
Lymphocytes Relative: 19 %
Lymphs Abs: 1.6 10*3/uL (ref 0.7–4.0)
MCH: 30.7 pg (ref 26.0–34.0)
MCHC: 32.7 g/dL (ref 30.0–36.0)
MCV: 93.9 fL (ref 78.0–100.0)
Monocytes Absolute: 1 10*3/uL (ref 0.1–1.0)
Monocytes Relative: 12 %
NEUTROS ABS: 5.8 10*3/uL (ref 1.7–7.7)
NEUTROS PCT: 66 %
Platelets: 186 10*3/uL (ref 150–400)
RBC: 3.75 MIL/uL — ABNORMAL LOW (ref 3.87–5.11)
RDW: 11.8 % (ref 11.5–15.5)
WBC: 8.7 10*3/uL (ref 4.0–10.5)

## 2016-02-02 LAB — BASIC METABOLIC PANEL
ANION GAP: 6 (ref 5–15)
BUN: 14 mg/dL (ref 6–20)
CALCIUM: 8.7 mg/dL — AB (ref 8.9–10.3)
CHLORIDE: 100 mmol/L — AB (ref 101–111)
CO2: 27 mmol/L (ref 22–32)
Creatinine, Ser: 0.54 mg/dL (ref 0.44–1.00)
GFR calc non Af Amer: >60 mL/min (ref >60)
Glucose, Bld: 119 mg/dL — ABNORMAL HIGH (ref 65–99)
Potassium: 3.5 mmol/L (ref 3.5–5.1)
Sodium: 133 mmol/L — ABNORMAL LOW (ref 135–145)

## 2016-02-02 MED ORDER — CLONAZEPAM 0.5 MG PO TABS
0.5000 mg | ORAL_TABLET | Freq: Three times a day (TID) | ORAL | Status: DC | PRN
  Administered 2016-02-02 – 2016-02-03 (×2): 0.5 mg via ORAL
  Filled 2016-02-02 (×4): qty 1

## 2016-02-02 NOTE — Clinical Social Work Note (Signed)
CSW met with patient at bedside to provide outpatient psychiatric resources. The patient states that she believes she is already set up with [facility]. Contact information to [facility] provided. The patient plans to call and start services when she is discharged. CSW also spoke with the patient's daughter [name] by phone per patient's request. [name] has questions about switching the patient's home health to [facility]. CSW has informed weekend [facility] of daughter's questions regarding this. CSW signing off at this time.    Liz Beach MSW, [place], [place], [phone]

## 2016-02-02 NOTE — Progress Notes (Signed)
PROGRESS NOTE    [name]  [id number] DOB: 1922-08-22 DOA: 01/29/2016 PCP: [name], MD   Brief Narrative: 82 yo woman with bradycardia and orthostatic hypotension. Recent worsening of anxiety and depression. Admits that she has tried multiple medications in the past couple of months.   Assessment & Plan:   Principal Problem:   Symptomatic bradycardia Active Problems:   Hypertension   Hyperlipidemia   GERD (gastroesophageal reflux disease)   MDD (major depressive disorder), recurrent severe, without psychosis ([place])   Bradycardia   Constipation   Depression  1. Cardiovascular. Bradycardia. Will continue to hold on AV blockade, EKG with sinus bradycardia. Patient with persistent bradycardia symptomatic. HR down to 57. Possible chronotropic incompetence, follow HR on ambulation.  Cardiology following peripherally now, no further workup planned. Dehydration with orthostasis. Persisetent orthostatic hypotension today with symptoms when standing. HTN Blood pressure systolic [id number] to [id number], will continue amlodipine for now.   2. Pulmonary. No signs of volume overload or pulmonary infection, will continue to monitor oxymetry and as needed supplemental 02 per [place].   3. Nephrology. Renal function with cr at 0,68 and K at 3,8. Will follow renal panel in am. Will avoid hypotension and nephrotoxic medications.  4. Neurology. Depression, Will continue sertraline, clonazepam. Noted to be more severe was seen by psych who feels that she needs to complete trial of SSRI and that does can be increased gradually over the next few weeks. 5. Gastroenterology. Constipation with fecal impaction. Continue bowel regimen, will add dulcolax, patient had des-impaction noted hard stools.      DVT prophylaxis: Lovenox Code Status: FULL Family Communication: Patient alone today. Disposition Plan: Home when ready.  Consultants:   cardiology  Procedures:    Antimicrobials:  Subjective: Patient feeling very tired and decrease level of energy. She was symptomatic with standing when orthostatics were checked this AM. She is pleading not to be sent home, since she is very stressed there and she is so depressed.  Objective: Filed Vitals:   02/02/16 0501 02/02/16 0952 02/02/16 1230 02/02/16 1358  BP: 124/74  111/63 120/55  Pulse: 57  58 64  Temp: 99.3 F (37.4 C) 98.7 F (37.1 C) 98.6 F (37 C) 98.3 F (36.8 C)  TempSrc: Oral  Oral Oral  Resp: 20  20 18   Height:      Weight:      SpO2: 96%   99%    Intake/Output Summary (Last 24 hours) at 02/02/16 1516 Last data filed at 02/02/16 0700  Gross per 24 hour  Intake 1292.5 ml  Output   1101 ml  Net  191.5 ml   Filed Weights   01/30/16 0022  Weight: 64.501 kg (142 lb 3.2 oz)    Examination:  General exam: deconditioned E ENT: mild conjunctival pallor, oral mucosa moist.   Respiratory system: Respiratory effort normal. No wheezing, rales or rhonchi. Decreased breath sounds at bases, with poor inspiratory effort. Cardiovascular system: S1 & S2 heard, bradycardic. RRR. No JVD, murmurs, rubs, gallops or clicks. No pedal edema. Gastrointestinal system: Abdomen is nondistended, soft and nontender. No organomegaly or masses felt. Normal bowel sounds heard. Central nervous system: Alert and oriented. No focal neurological deficits. Extremities: Symmetric 5 x 5 power. Skin: No rashes, lesions or ulcers Psychiatry: depressed mood.  Data Reviewed: I have personally reviewed following labs and imaging studies  CBC:  Recent Labs Lab 01/29/16 2116 01/29/16 2351 01/30/16 0309 02/02/16 0457  WBC  --  6.8 8.2 8.7  NEUTROABS  --   --   --  5.8  HGB 13.3 12.3 11.6* 11.5*  HCT 39.0 37.5 34.6* 35.2*  MCV  --  93.5 93.8 93.9  PLT  --  203 193 [id number]   Basic Metabolic Panel:  Recent Labs Lab 01/29/16 2116 01/29/16 2351 01/30/16 0309 02/01/16 0500 02/02/16 0457  NA 137  --   --  136  133*  K 3.8  --   --  3.8 3.5  CL 98*  --   --  101 100*  CO2  --   --   --  28 27  GLUCOSE 96  --   --  106* 119*  BUN 17  --   --  15 14  CREATININE 0.80  --  0.63 0.68 0.54  CALCIUM  --   --   --  8.8* 8.7*  MG  --  2.0  --   --   --    GFR: Estimated Creatinine Clearance: 42.2 mL/min (by C-G formula based on Cr of 0.54). Liver Function Tests: No results for input(s): AST, ALT, ALKPHOS, BILITOT, PROT, ALBUMIN in the last 168 hours. No results for input(s): LIPASE, AMYLASE in the last 168 hours. No results for input(s): AMMONIA in the last 168 hours. Coagulation Profile: No results for input(s): INR, PROTIME in the last 168 hours. Cardiac Enzymes:  Recent Labs Lab 01/30/16 0309 01/30/16 0903  TROPONINI <0.03 <0.03   BNP (last 3 results) No results for input(s): PROBNP in the last 8760 hours. HbA1C: No results for input(s): HGBA1C in the last 72 hours. CBG: No results for input(s): GLUCAP in the last 168 hours. Lipid Profile: No results for input(s): CHOL, HDL, LDLCALC, TRIG, CHOLHDL, LDLDIRECT in the last 72 hours. Thyroid Function Tests: No results for input(s): TSH, T4TOTAL, FREET4, T3FREE, THYROIDAB in the last 72 hours. Anemia Panel: No results for input(s): VITAMINB12, FOLATE, FERRITIN, TIBC, IRON, RETICCTPCT in the last 72 hours. Sepsis Labs: No results for input(s): PROCALCITON, LATICACIDVEN in the last 168 hours.  Recent Results (from the past 240 hour(s))  MRSA PCR Screening     Status: None   Collection Time: 01/30/16  1:18 AM  Result Value Ref Range Status   MRSA by PCR NEGATIVE NEGATIVE Final    Comment:        The GeneXpert MRSA Assay (FDA approved for NASAL specimens only), is one component of a comprehensive MRSA colonization surveillance program. It is not intended to diagnose MRSA infection nor to guide or monitor treatment for MRSA infections.          Radiology Studies: No results found.      Scheduled Meds: . amLODipine  5  mg Oral Daily  . aspirin EC  81 mg Oral Daily  . cholecalciferol  2,000 Units Oral QHS  . enoxaparin (LOVENOX) injection  40 mg Subcutaneous Q24H  . pantoprazole  40 mg Oral Daily  . polyethylene glycol  17 g Oral BID  . sertraline  50 mg Oral Daily  . simvastatin  20 mg Oral Daily  . sodium chloride flush  3 mL Intravenous Q12H   Continuous Infusions: . sodium chloride 75 mL/hr at 02/02/16 0530            Mir Marry Guan, MD [facility] Pager [phone]  If 7PM-7AM, please contact night-coverage [email or web address] Password [email or web address] 02/02/2016, 3:16 PM

## 2016-02-02 NOTE — Care Management Note (Addendum)
Case Management Note  Patient Details  Name: [name] MRN: [id number] Date of Birth: 1922/11/05  Subjective/Objective:                  bradycardia, depression Action/Plan: Discharge planning Expected Discharge Date:                  Expected Discharge Plan:  [place]  In-House Referral:     Discharge planning Services  CM Consult  Post Acute Care Choice:  Durable Medical Equipment, Home Health Choice offered to:  Adult Children, Patient  DME Arranged:  3-N-1, Walker rolling DME Agency:  [facility] Arranged:  RN, PT [facility] Agency:  [place]  Status of Service:  Completed, signed off  If discussed at [place] of Stay Meetings, dates discussed:    Additional Comments: CM received call from CSW, Marshell Levan to please call [name] [phone] to change [facility] services to [facility] and to arrange for DMe.  CM spoke with [name] who chooses [facility] for HHRN/PT and requests a rolling walker and 3n1 to be delivered to room so pt can be discharged.  CM explained cancellation of [facility] and new referral on the weekend to [facility] may not be looked at for [facility] until the workweek (Mon-Tues).  CM called [facility] rep, Tommi Emery to cancel arranged [facility] services and called Alvis Lemmings [phone] and faxed requested information to [phone]. CM received confirmation of receipt. CM called [facility] DME rep, Germaine to please deliver the 3n1 and rolling walker to room so pt can discharge.  No other CM needs were communicated. Dellie Catholic, RN 02/02/2016, 11:43 AM

## 2016-02-02 NOTE — Progress Notes (Signed)
   SUBJECTIVE: The patient is doing well today.  Her concerns are with depression and anxiety at this time.  Dizziness is improving. At this time, she denies chest pain, shortness of breath, or any new concerns.  Marland Kitchen amLODipine  5 mg Oral Daily  . aspirin EC  81 mg Oral Daily  . cholecalciferol  2,000 Units Oral QHS  . enoxaparin (LOVENOX) injection  40 mg Subcutaneous Q24H  . pantoprazole  40 mg Oral Daily  . polyethylene glycol  17 g Oral BID  . sertraline  50 mg Oral Daily  . simvastatin  20 mg Oral Daily  . sodium chloride flush  3 mL Intravenous Q12H   . sodium chloride 75 mL/hr at 02/02/16 0530    OBJECTIVE: Physical Exam: Filed Vitals:   02/01/16 1939 02/01/16 1957 02/02/16 0501 02/02/16 0952  BP: 149/73 172/77 124/74   Pulse: 64  57   Temp: 99 F (37.2 C)  99.3 F (37.4 C) 98.7 F (37.1 C)  TempSrc: Oral  Oral   Resp: 20  20   Height:      Weight:      SpO2: 97%  96%     Intake/Output Summary (Last 24 hours) at 02/02/16 1023 Last data filed at 02/02/16 0700  Gross per 24 hour  Intake 1292.5 ml  Output   1226 ml  Net   66.5 ml    Telemetry reveals sinus rhythm, heart rates mostly 50s-60s  GEN- The patient is elderly appearing, alert and witty Head- normocephalic, atraumatic Eyes-  Sclera clear, conjunctiva pink Ears- hearing intact Oropharynx- clear Neck- supple,   Lungs- Clear to ausculation bilaterally, normal work of breathing Heart- Regular rate and rhythm, 2/6 SEM LSB GI- soft, NT, ND, + BS Extremities- no clubbing, cyanosis, or edema Skin- no rash or lesion Psych- euthymic mood, full affect Neuro- strength and sensation are intact  LABS: Basic Metabolic Panel:  Recent Labs  02/01/16 0500 02/02/16 0457  NA 136 133*  K 3.8 3.5  CL 101 100*  CO2 28 27  GLUCOSE 106* 119*  BUN 15 14  CREATININE 0.68 0.54  CALCIUM 8.8* 8.7*   Liver Function Tests: No results for input(s): AST, ALT, ALKPHOS, BILITOT, PROT, ALBUMIN in the last 72  hours. No results for input(s): LIPASE, AMYLASE in the last 72 hours. CBC:  Recent Labs  02/02/16 0457  WBC 8.7  NEUTROABS 5.8  HGB 11.5*  HCT 35.2*  MCV 93.9  PLT 186     ASSESSMENT AND PLAN:   1. Sinus bradycardia Markedly improved off of calcium channel blocker No further workup planned No indication for pacing at this time Given advanced age, would favor a conservative approach long term  2. orthostasis IVF per primary team  No further inpatient cardiology workup planned Cardiology to see as needed Please call with questions  Thompson Grayer, MD 02/02/2016 10:23 AM

## 2016-02-03 ENCOUNTER — Observation Stay (HOSPITAL_COMMUNITY): Payer: Medicare Other

## 2016-02-03 DIAGNOSIS — R001 Bradycardia, unspecified: Secondary | ICD-10-CM | POA: Diagnosis not present

## 2016-02-03 LAB — BASIC METABOLIC PANEL
ANION GAP: 6 (ref 5–15)
BUN: 9 mg/dL (ref 6–20)
CHLORIDE: 101 mmol/L (ref 101–111)
CO2: 26 mmol/L (ref 22–32)
Calcium: 8.4 mg/dL — ABNORMAL LOW (ref 8.9–10.3)
Creatinine, Ser: 0.58 mg/dL (ref 0.44–1.00)
GFR calc Af Amer: >60 mL/min (ref >60)
Glucose, Bld: 109 mg/dL — ABNORMAL HIGH (ref 65–99)
POTASSIUM: 3.4 mmol/L — AB (ref 3.5–5.1)
SODIUM: 133 mmol/L — AB (ref 135–145)

## 2016-02-03 LAB — CBC
HEMATOCRIT: 33.2 % — AB (ref 36.0–46.0)
HEMOGLOBIN: 11.1 g/dL — AB (ref 12.0–15.0)
MCH: 31.1 pg (ref 26.0–34.0)
MCHC: 33.4 g/dL (ref 30.0–36.0)
MCV: 93 fL (ref 78.0–100.0)
Platelets: 232 10*3/uL (ref 150–400)
RBC: 3.57 MIL/uL — ABNORMAL LOW (ref 3.87–5.11)
RDW: 12 % (ref 11.5–15.5)
WBC: 9.2 10*3/uL (ref 4.0–10.5)

## 2016-02-03 LAB — LACTIC ACID, PLASMA
LACTIC ACID, VENOUS: 1.3 mmol/L (ref 0.5–1.9)
Lactic Acid, Venous: 0.7 mmol/L (ref 0.5–1.9)

## 2016-02-03 LAB — URINE MICROSCOPIC-ADD ON

## 2016-02-03 LAB — URINALYSIS, ROUTINE W REFLEX MICROSCOPIC
Bilirubin Urine: NEGATIVE
Glucose, UA: NEGATIVE mg/dL
Hgb urine dipstick: NEGATIVE
Ketones, ur: NEGATIVE mg/dL
NITRITE: NEGATIVE
PROTEIN: NEGATIVE mg/dL
Specific Gravity, Urine: 1.009 (ref 1.005–1.030)
pH: 7.5 (ref 5.0–8.0)

## 2016-02-03 MED ORDER — AMLODIPINE BESYLATE 5 MG PO TABS: 5.0000 mg | ORAL_TABLET | Freq: Every day | ORAL | Status: DC

## 2016-02-03 MED ORDER — POLYETHYLENE GLYCOL 3350 17 G PO PACK: 17.0000 g | PACK | Freq: Two times a day (BID) | ORAL | Status: DC

## 2016-02-03 MED ORDER — BISACODYL 5 MG PO TBEC: 5.0000 mg | DELAYED_RELEASE_TABLET | Freq: Every day | ORAL | Status: AC | PRN

## 2016-02-03 NOTE — Care Management (Signed)
Reviewed for d/c today. Venita Sheffield RN CCM

## 2016-02-03 NOTE — Progress Notes (Signed)
Patient had a temp of 100.8 this morning. On call made aware. New orders given.

## 2016-02-03 NOTE — Discharge Summary (Signed)
Discharge Summary  [name] [id number] DOB: 23-Sep-1922  PCP: [name], MD  Admit date: 01/29/2016 Discharge date: 02/03/2016   Recommendations for Outpatient Follow-up:  1. PCP in 1-2 weeks 2. Behavioral Health for outpatient therapy as recommended by psych.   Discharge Diagnoses:  Active Hospital Problems   Diagnosis Date Noted  . Symptomatic bradycardia 01/30/2016  . Bradycardia   . Constipation   . Depression   . MDD (major depressive disorder), recurrent severe, without psychosis ([place])   . GERD (gastroesophageal reflux disease)   . Hyperlipidemia   . Hypertension     Resolved Hospital Problems   Diagnosis Date Noted Date Resolved  No resolved problems to display.    Discharge Condition: Stable   Diet recommendation: Regular, as tolerated.   Filed Vitals:   02/03/16 0840 02/03/16 1214  BP:    Pulse:    Temp: 99.1 F (37.3 C) 98.5 F (36.9 C)  Resp:      History of present illness:  82 yo woman with bradycardia and symptomatic orthostatic hypotension. Recent worsening of anxiety and depression.   Hospital Course:  Principal Problem:   Symptomatic bradycardia Active Problems:   Hypertension   Hyperlipidemia   GERD (gastroesophageal reflux disease)   MDD (major depressive disorder), recurrent severe, without psychosis ([place])   Bradycardia   Constipation   Depression  1. Cardiovascular. Bradycardia. Will continue to hold on AV blockade, EKG with sinus bradycardia. Patient with persistent bradycardia symptomatic. HR now improved today. Possible chronotropic incompetence, follow HR on ambulation. Cardiology has seen her in consultation, no further workup planned. Dehydration with orthostasis. Persistent orthostatic hypotension with symptoms when standing has finally improved. Patient has ambulated with nursing staff also in presence of her daughter and she is deemed steady on her feet, safe and ready for discharge home today. HTN Blood pressure  systolic [id number] to [id number], will continue amlodipine for now.   2. Pulmonary. No signs of volume overload or pulmonary infection, will continue to monitor oxymetry and as needed supplemental 02 per [place].   3. Nephrology. Renal function normal today  4. Neurology. Depression, Will continue sertraline, clonazepam. Noted to be more severe was seen by psych who feels that she needs to complete trial of SSRI and that does can be increased gradually over the next few weeks. They do recommend outpatient intensive therapy and this will be arranged. 5. Gastroenterology. Constipation with fecal impaction. Continue bowel regimen, will add dulcolax, patient had des-impaction noted hard stools.   Long discussion with patient, daughter and RN this morning. Patient is medically ready to leave the hospital. Severe lack of motivation. 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 Daughter explained all the arrangements with caregivers that have been made. It's clear the patient won't need to do anything other than care for herself. Patient and daughter are agreeable to discharge home today, all questions were answered.  Procedures:  None   Consultations:  Psych  Cardiology   Discharge Exam: BP 154/68 mmHg  Pulse 71  Temp(Src) 98.5 F (36.9 C) (Oral)  Resp 20  Ht 5\' 3"  (1.6 m)  Wt 64.501 kg (142 lb 3.2 oz)  BMI 25.20 kg/m2  SpO2 96% General:  Alert, oriented, calm, in no acute distress, very anxious and flat affect Eyes: pupils round and reactive to light and accomodation, clear sclerea Neck: supple, no masses, trachea mildline  Cardiovascular: RRR, no murmurs or rubs, no peripheral edema  Respiratory: clear to auscultation bilaterally, no wheezes, no crackles  Abdomen: soft, nontender, nondistended, normal bowel tones heard  Skin: dry, no rashes  Musculoskeletal: no joint effusions, normal range of motion  Psychiatric: appropriate affect,  normal speech  Neurologic: extraocular muscles intact, clear speech, moving all extremities with intact sensorium    Discharge Instructions You were cared for by a hospitalist during your hospital stay. If you have any questions about your discharge medications or the care you received while you were in the hospital after you are discharged, you can call the unit and asked to speak with the hospitalist on call if the hospitalist that took care of you is not available. Once you are discharged, your primary care physician will handle any further medical issues. Please note that NO REFILLS for any discharge medications will be authorized once you are discharged, as it is imperative that you return to your primary care physician (or establish a relationship with a primary care physician if you do not have one) for your aftercare needs so that they can reassess your need for medications and monitor your lab values.     Medication List    STOP taking these medications        diltiazem 180 MG 24 hr capsule  Commonly known as:  CARDIZEM CD      TAKE these medications        acetaminophen 325 MG tablet  Commonly known as:  TYLENOL  Take 650 mg by mouth every 4 (four) hours as needed for mild pain, moderate pain, fever or headache.     amLODipine 5 MG tablet  Commonly known as:  NORVASC  Take 1 tablet (5 mg total) by mouth daily.     aspirin EC 81 MG tablet  Take 81 mg by mouth daily.     bisacodyl 5 MG EC tablet  Commonly known as:  DULCOLAX  Take 1 tablet (5 mg total) by mouth daily as needed for moderate constipation.     cholecalciferol 1000 units tablet  Commonly known as:  VITAMIN D  Take 2,000 Units by mouth daily.     clonazePAM 1 MG tablet  Commonly known as:  KLONOPIN  Take 0.5-1 mg by mouth 2 (two) times daily as needed for anxiety.     fluticasone 50 MCG/ACT nasal spray  Commonly known as:  FLONASE  Place 2 sprays into both nostrils daily.     multivitamin tablet    Take 1 tablet by mouth daily.     omeprazole 20 MG capsule  Commonly known as:  PRILOSEC  Take 1 capsule (20 mg total) by mouth daily.     ondansetron 4 MG tablet  Commonly known as:  ZOFRAN  Take 2-4 mg by mouth every 8 (eight) hours as needed for nausea or vomiting.     polyethylene glycol packet  Commonly known as:  MIRALAX / GLYCOLAX  Take 17 g by mouth 2 (two) times daily.     sertraline 25 MG tablet  Commonly known as:  ZOLOFT  Take 50 mg by mouth daily.     simvastatin 20 MG tablet  Commonly known as:  ZOCOR  TAKE 1 TABLET EVERY DAY     triamcinolone cream 0.1 %  Commonly known as:  KENALOG  Place 1 application vaginally 2 (two) times daily as needed (for itching).       Allergies  Allergen Reactions  . Lisinopril-Hydrochlorothiazide Cough  . Mercury Rash  . Codeine Other (See Comments)  fainted  . Sulfonamide Derivatives Rash       Follow-up Information    Follow up with [facility].   Specialty:  Home Health Services   Why:  home health physical therapy and nurse   Contact information:   [address] [place] [address] [phone]       Follow up with [facility].   Why:  rolling walker and 3n1   Contact information:   [address] [place] [address] [phone]        The results of significant diagnostics from this hospitalization (including imaging, microbiology, ancillary and laboratory) are listed below for reference.    Significant Diagnostic Studies: Ct Head Wo Contrast  01/29/2016  CLINICAL DATA:  Status post fall.  Anxiety and depression. EXAM: CT HEAD WITHOUT CONTRAST TECHNIQUE: Contiguous axial images were obtained from the base of the skull through the vertex without intravenous contrast. COMPARISON:  None. FINDINGS: There is no evidence of mass effect, midline shift, or extra-axial fluid collections. There is no evidence of a space-occupying lesion or intracranial  hemorrhage. There is no evidence of a cortical-based area of acute infarction. There is generalized cerebral atrophy. There is periventricular white matter low attenuation likely secondary to microangiopathy. The ventricles and sulci are appropriate for the patient's age. The basal cisterns are patent. Visualized portions of the orbits are unremarkable. The visualized portions of the paranasal sinuses and mastoid air cells are unremarkable. Cerebrovascular atherosclerotic calcifications are noted. The osseous structures are unremarkable. IMPRESSION: No acute intracranial pathology. Electronically Signed   By: Kathreen Devoid   On: 01/29/2016 22:19   Dg Chest Port 1 View  02/03/2016  CLINICAL DATA:  Fever. EXAM: PORTABLE CHEST 1 VIEW COMPARISON:  12/08/2011 FINDINGS: Mild cardiac enlargement. Vascular congestion. Emphysematous changes in the lungs. Peribronchial thickening and mild central interstitial changes likely representing chronic bronchitis. No focal airspace disease or consolidation. No blunting of costophrenic angles. No pneumothorax. Tortuous aorta. Degenerative changes in the spine and shoulders. IMPRESSION: Emphysematous and chronic bronchitic changes in the lungs. No evidence of active pulmonary disease. Electronically Signed   By: Lucienne Capers M.D.   On: 02/03/2016 06:48    Microbiology: Recent Results (from the past 240 hour(s))  MRSA PCR Screening     Status: None   Collection Time: 01/30/16  1:18 AM  Result Value Ref Range Status   MRSA by PCR NEGATIVE NEGATIVE Final    Comment:        The GeneXpert MRSA Assay (FDA approved for NASAL specimens only), is one component of a comprehensive MRSA colonization surveillance program. It is not intended to diagnose MRSA infection nor to guide or monitor treatment for MRSA infections.   Culture, blood (routine x 2)     Status: None (Preliminary result)   Collection Time: 02/03/16  7:04 AM  Result Value Ref Range Status   Specimen  Description BLOOD RIGHT ANTECUBITAL  Final   Special Requests   Final    BOTTLES DRAWN AEROBIC AND ANAEROBIC 10CC Performed at [facility]    Culture PENDING  Incomplete   Report Status PENDING  Incomplete     Labs: Basic Metabolic Panel:  Recent Labs Lab 01/29/16 2116 01/29/16 2351 01/30/16 0309 02/01/16 0500 02/02/16 0457 02/03/16 0649  NA 137  --   --  136 133* 133*  K 3.8  --   --  3.8 3.5 3.4*  CL 98*  --   --  101 100* 101  CO2  --   --   --  28 27 26   GLUCOSE 96  --   --  106* 119* 109*  BUN 17  --   --  15 14 9   CREATININE 0.80  --  0.63 0.68 0.54 0.58  CALCIUM  --   --   --  8.8* 8.7* 8.4*  MG  --  2.0  --   --   --   --    Liver Function Tests: No results for input(s): AST, ALT, ALKPHOS, BILITOT, PROT, ALBUMIN in the last 168 hours. No results for input(s): LIPASE, AMYLASE in the last 168 hours. No results for input(s): AMMONIA in the last 168 hours. CBC:  Recent Labs Lab 01/29/16 2116 01/29/16 2351 01/30/16 0309 02/02/16 0457 02/03/16 0649  WBC  --  6.8 8.2 8.7 9.2  NEUTROABS  --   --   --  5.8  --   HGB 13.3 12.3 11.6* 11.5* 11.1*  HCT 39.0 37.5 34.6* 35.2* 33.2*  MCV  --  93.5 93.8 93.9 93.0  PLT  --  203 193 186 232   Cardiac Enzymes:  Recent Labs Lab 01/30/16 0309 01/30/16 0903  TROPONINI <0.03 <0.03   BNP: BNP (last 3 results) No results for input(s): BNP in the last 8760 hours.  ProBNP (last 3 results) No results for input(s): PROBNP in the last 8760 hours.  CBG: No results for input(s): GLUCAP in the last 168 hours.  Time spent: 36 minutes were spent in preparing this discharge including medication reconciliation, counseling, and coordination of care.  Signed:  Genecis Veley Marry Guan  [facility] 02/03/2016, 12:47 PM

## 2016-02-04 LAB — URINE CULTURE

## 2016-02-08 LAB — CULTURE, BLOOD (ROUTINE X 2)
CULTURE: NO GROWTH
Culture: NO GROWTH

## 2016-02-11 ENCOUNTER — Emergency Department (HOSPITAL_COMMUNITY): Payer: Medicare Other

## 2016-02-11 ENCOUNTER — Encounter (HOSPITAL_COMMUNITY): Payer: Self-pay | Admitting: Emergency Medicine

## 2016-02-11 ENCOUNTER — Emergency Department (HOSPITAL_COMMUNITY)
Admission: EM | Admit: 2016-02-11 | Discharge: 2016-02-11 | Disposition: A | Discharge status: Home / Self Care | Payer: Medicare Other | Attending: Emergency Medicine | Admitting: Emergency Medicine

## 2016-02-11 DIAGNOSIS — F329 Major depressive disorder, single episode, unspecified: Secondary | ICD-10-CM | POA: Insufficient documentation

## 2016-02-11 DIAGNOSIS — Z79899 Other long term (current) drug therapy: Secondary | ICD-10-CM | POA: Diagnosis not present

## 2016-02-11 DIAGNOSIS — F32A Depression, unspecified: Secondary | ICD-10-CM

## 2016-02-11 DIAGNOSIS — K5900 Constipation, unspecified: Secondary | ICD-10-CM | POA: Diagnosis not present

## 2016-02-11 DIAGNOSIS — M549 Dorsalgia, unspecified: Secondary | ICD-10-CM

## 2016-02-11 DIAGNOSIS — Z87891 Personal history of nicotine dependence: Secondary | ICD-10-CM | POA: Insufficient documentation

## 2016-02-11 DIAGNOSIS — Z7982 Long term (current) use of aspirin: Secondary | ICD-10-CM | POA: Diagnosis not present

## 2016-02-11 DIAGNOSIS — M546 Pain in thoracic spine: Secondary | ICD-10-CM | POA: Insufficient documentation

## 2016-02-11 DIAGNOSIS — M25511 Pain in right shoulder: Secondary | ICD-10-CM | POA: Diagnosis not present

## 2016-02-11 DIAGNOSIS — Z96641 Presence of right artificial hip joint: Secondary | ICD-10-CM | POA: Insufficient documentation

## 2016-02-11 DIAGNOSIS — I10 Essential (primary) hypertension: Secondary | ICD-10-CM | POA: Diagnosis not present

## 2016-02-11 LAB — CBC WITH DIFFERENTIAL/PLATELET
BASOS ABS: 0 10*3/uL (ref 0.0–0.1)
BASOS PCT: 0 %
EOS ABS: 0.1 10*3/uL (ref 0.0–0.7)
EOS PCT: 1 %
HCT: 35.7 % — ABNORMAL LOW (ref 36.0–46.0)
Hemoglobin: 11.7 g/dL — ABNORMAL LOW (ref 12.0–15.0)
Lymphocytes Relative: 8 %
Lymphs Abs: 0.9 10*3/uL (ref 0.7–4.0)
MCH: 30.8 pg (ref 26.0–34.0)
MCHC: 32.8 g/dL (ref 30.0–36.0)
MCV: 93.9 fL (ref 78.0–100.0)
Monocytes Absolute: 1 10*3/uL (ref 0.1–1.0)
Monocytes Relative: 9 %
Neutro Abs: 8.5 10*3/uL — ABNORMAL HIGH (ref 1.7–7.7)
Neutrophils Relative %: 82 %
PLATELETS: 335 10*3/uL (ref 150–400)
RBC: 3.8 MIL/uL — AB (ref 3.87–5.11)
RDW: 11.6 % (ref 11.5–15.5)
WBC: 10.5 10*3/uL (ref 4.0–10.5)

## 2016-02-11 LAB — URINALYSIS, ROUTINE W REFLEX MICROSCOPIC
BILIRUBIN URINE: NEGATIVE
Glucose, UA: NEGATIVE mg/dL
Hgb urine dipstick: NEGATIVE
Ketones, ur: 15 mg/dL — AB
NITRITE: NEGATIVE
PH: 7 (ref 5.0–8.0)
Protein, ur: NEGATIVE mg/dL
SPECIFIC GRAVITY, URINE: 1.019 (ref 1.005–1.030)

## 2016-02-11 LAB — COMPREHENSIVE METABOLIC PANEL
ALBUMIN: 3 g/dL — AB (ref 3.5–5.0)
ALT: 26 U/L (ref 14–54)
AST: 18 U/L (ref 15–41)
Alkaline Phosphatase: 239 U/L — ABNORMAL HIGH (ref 38–126)
Anion gap: 9 (ref 5–15)
BUN: 13 mg/dL (ref 6–20)
CHLORIDE: 93 mmol/L — AB (ref 101–111)
CO2: 30 mmol/L (ref 22–32)
CREATININE: 0.69 mg/dL (ref 0.44–1.00)
Calcium: 9.2 mg/dL (ref 8.9–10.3)
GFR calc non Af Amer: >60 mL/min (ref >60)
Glucose, Bld: 110 mg/dL — ABNORMAL HIGH (ref 65–99)
Potassium: 3.8 mmol/L (ref 3.5–5.1)
SODIUM: 132 mmol/L — AB (ref 135–145)
Total Bilirubin: 0.7 mg/dL (ref 0.3–1.2)
Total Protein: 7.1 g/dL (ref 6.5–8.1)

## 2016-02-11 LAB — URINE MICROSCOPIC-ADD ON

## 2016-02-11 MED ORDER — ONDANSETRON HCL 4 MG/2ML IJ SOLN
4.0000 mg | Freq: Once | INTRAMUSCULAR | Status: AC
  Administered 2016-02-11: 4 mg via INTRAVENOUS
  Filled 2016-02-11: qty 2

## 2016-02-11 MED ORDER — LACTULOSE 10 GM/15ML PO SOLN
20.0000 g | Freq: Once | ORAL | Status: AC
  Administered 2016-02-11: 20 g via ORAL
  Filled 2016-02-11: qty 30

## 2016-02-11 MED ORDER — CLONAZEPAM 0.5 MG PO TABS
0.5000 mg | ORAL_TABLET | ORAL | Status: AC
  Administered 2016-02-11: 0.5 mg via ORAL
  Filled 2016-02-11: qty 1

## 2016-02-11 NOTE — ED Notes (Signed)
[name]; Transporter, transporting pt to x-ray.  

## 2016-02-11 NOTE — ED Notes (Signed)
[employer]; Transporter, returned pt to room.

## 2016-02-11 NOTE — Discharge Instructions (Signed)
Read the information below.  You may return to the Emergency Department at any time for worsening condition or any new symptoms that concern you.   Please take 2-3 doses of 0.5mg  klonopin daily and discuss your weaning regimen with your doctor.    Take tramadol as needed for pain, once every 8 hours if needed.    Take Miralax up to twice daily if needed for constipation.  Try to increase the amount of fluid you drink.

## 2016-02-11 NOTE — ED Notes (Signed)
Pt. Had successful large bowel movement.

## 2016-02-11 NOTE — Progress Notes (Signed)
Orthopedic Tech Progress Note Patient Details:  [name] April 25, 1923 [id number]  Ortho Devices Type of Ortho Device: Arm sling   [name] 02/11/2016, 3:47 PM

## 2016-02-11 NOTE — ED Triage Notes (Signed)
Pain in both shoulders and back x 3 days also has  Constipation for 3 days also has taken a laxative but it has not worked

## 2016-02-11 NOTE — ED Notes (Signed)
Pt. Attempted to have bowel movement on bedside commode. Pt. Unsuccessful at this time, pt. Concerned regarding constipation. Raquel Sarna PA made aware. Pt. Requesting food, will order tray.

## 2016-02-11 NOTE — BH Assessment (Addendum)
Tele Assessment Note   [name] is a 82 y.o. female who was referred for a TTS assessment due to recurrent depression. Pt denies current, or hx of, SI, HI, AVH. Pt endorses depression. Pt identifies her main stressor as her husband being "very sick". Pt admits to low motivation to do anything, but also acknowledges a desire to get better. Pt expresses understanding of having to make an effort to encourage motivation. Pt has been seeing a psychologist to assist with her depression for the past few weeks and has an appt tomorrow. Pt also has an appt at the end of August to see Dr. Casimiro Needle for psychiatry. [name] daughter was also present during the assessment and corroborated with information that pt shared and identified as a support for pt by ensuring that pt makes her appts and stays active in other ways.   Past Medical History:  Past Medical History:  Diagnosis Date  . Allergy   . Arthritis   . Depression   . Endometriosis   . GERD (gastroesophageal reflux disease)   . Hearing loss 12-08-11   bil  . Hyperlipidemia   . Hypertension   . Osteoporosis   . Ovarian cyst   . S/P right THA, AA 12/16/2011  . Spinal stenosis     Past Surgical History:  Procedure Laterality Date  . CATARACT EXTRACTION, BILATERAL  12-08-11   bil  . DILATION AND CURETTAGE OF UTERUS    . HERNIA REPAIR    . HYSTEROSCOPY    . OOPHORECTOMY     bilateral  . STERIOD INJECTION  12/09/11   to spine for rt calf pain and spasms  . TIBIA FRACTURE SURGERY     trauma  . TOTAL HIP ARTHROPLASTY  12/16/2011   Procedure: TOTAL HIP ARTHROPLASTY ANTERIOR APPROACH;  Surgeon: Mauri Pole, MD;  Location: [facility];  Service: Orthopedics;  Laterality: Right;    Family History:  Family History  Problem Relation Age of Onset  . Stroke Sister   . Diabetes Daughter   . Cancer Father     esophogeal  . Cancer Paternal Aunt     stomach  . Breast cancer Maternal Grandmother     Age 48    Social History:  reports that she has  quit smoking. She does not have any smokeless tobacco history on file. She reports that she drinks about 0.5 oz of alcohol per week . She reports that she does not use drugs.  Additional Social History:  Alcohol / Drug Use Pain Medications: see PTA meds Prescriptions: see PTA meds Over the Counter: see PTA meds History of alcohol / drug use?: No history of alcohol / drug abuse  CIWA: CIWA-Ar BP: 126/77 Pulse Rate: 72 COWS:    PATIENT STRENGTHS: (choose at least two) Ability for insight Average or above average intelligence Capable of independent living Financial means Motivation for treatment/growth Supportive family/friends  Allergies:  Allergies  Allergen Reactions  . Lisinopril-Hydrochlorothiazide Cough  . Mercury Rash  . Codeine Other (See Comments)    fainted  . Sulfonamide Derivatives Rash    Home Medications:  (Not in a hospital admission)  OB/GYN Status:  No LMP recorded. Patient is postmenopausal.  General Assessment Data Location of Assessment: [facility] ED TTS Assessment: In system Is this a Tele or Face-to-Face Assessment?: Tele Assessment Is this an Initial Assessment or a Re-assessment for this encounter?: Initial Assessment Marital status: Married Is patient pregnant?: No Pregnancy Status: No Living Arrangements: Spouse/significant other Can pt return  to current living arrangement?: Yes Admission Status: Voluntary Is patient capable of signing voluntary admission?: Yes Referral Source: Self/Family/Friend Insurance type: [facility] Medicare  Medical Screening Exam ([place]) Medical Exam completed: Yes  Crisis Care Plan Living Arrangements: Spouse/significant other Name of Psychiatrist: none Name of Therapist: Pervis Hocking, PSYCHOLOGIST  Education Status Is patient currently in school?: No  Risk to self with the past 6 months Suicidal Ideation: No Has patient been a risk to self within the past 6 months prior to admission? : No Suicidal Intent:  No Has patient had any suicidal intent within the past 6 months prior to admission? : No Is patient at risk for suicide?: No Suicidal Plan?: No Has patient had any suicidal plan within the past 6 months prior to admission? : No Access to Means: No What has been your use of drugs/alcohol within the last 12 months?: NONE Previous Attempts/Gestures: No Intentional Self Injurious Behavior: None Family Suicide History: No Recent stressful life event(s): Other (Comment) (husband is very sick) Persecutory voices/beliefs?: No Depression: Yes Depression Symptoms: Loss of interest in usual pleasures, Feeling worthless/self pity Substance abuse history and/or treatment for substance abuse?: No Suicide prevention information given to non-admitted patients: Not applicable  Risk to Others within the past 6 months Homicidal Ideation: No Does patient have any lifetime risk of violence toward others beyond the six months prior to admission? : No Thoughts of Harm to Others: No Current Homicidal Intent: No Current Homicidal Plan: No Access to Homicidal Means: No History of harm to others?: No Assessment of Violence: None Noted Does patient have access to weapons?: No Criminal Charges Pending?: No Does patient have a court date: No Is patient on probation?: No  Psychosis Hallucinations: None noted Delusions: None noted  Mental Status Report Appearance/Hygiene: Unremarkable Eye Contact: Good Motor Activity: Unremarkable Speech: Logical/coherent Level of Consciousness: Alert Mood: Depressed Affect: Appropriate to circumstance Anxiety Level: None Thought Processes: Coherent, Relevant Judgement: Unimpaired Orientation: Person, Place, Situation, Time, Appropriate for developmental age Obsessive Compulsive Thoughts/Behaviors: None  Cognitive Functioning Concentration: Normal Memory: Recent Intact, Remote Intact IQ: Average Insight: Good Impulse Control: Good Appetite: Fair Sleep: No  Change Vegetative Symptoms: None  ADLScreening [facility] Assessment Services) Patient's cognitive ability adequate to safely complete daily activities?: Yes Patient able to express need for assistance with ADLs?: Yes Independently performs ADLs?: Yes (appropriate for developmental age)  Prior Inpatient Therapy Prior Inpatient Therapy: Yes Prior Therapy Dates: 50 years ago Prior Therapy Facilty/Provider(s): unknown Reason for Treatment: catatonia  Prior Outpatient Therapy Prior Outpatient Therapy: No Does patient have an ACCT team?: No Does patient have Intensive In-House Services?  : No Does patient have [facility] services? : No Does patient have P4CC services?: No  ADL Screening (condition at time of admission) Patient's cognitive ability adequate to safely complete daily activities?: Yes Is the patient deaf or have difficulty hearing?: No Does the patient have difficulty seeing, even when wearing glasses/contacts?: No Does the patient have difficulty concentrating, remembering, or making decisions?: No Patient able to express need for assistance with ADLs?: Yes Does the patient have difficulty dressing or bathing?: No Independently performs ADLs?: Yes (appropriate for developmental age) Does the patient have difficulty walking or climbing stairs?: No Weakness of Legs: None Weakness of Arms/Hands: None  Home Assistive Devices/Equipment Home Assistive Devices/Equipment: None  Therapy Consults (therapy consults require a physician order) PT Evaluation Needed: No OT Evalulation Needed: No SLP Evaluation Needed: No Abuse/Neglect Assessment (Assessment to be complete while patient is alone) Physical Abuse:  Denies Verbal Abuse: Denies Sexual Abuse: Denies Exploitation of patient/patient's resources: Denies Self-Neglect: Denies Values / Beliefs Cultural Requests During Hospitalization: None Spiritual Requests During Hospitalization: None Consults Spiritual Care Consult Needed:  No Social Work Consult Needed: No Regulatory affairs officer (For Healthcare) Does patient have an advance directive?: Yes Type of Advance Directive: Press photographer Does patient want to make changes to advanced directive?: No - Patient declined Copy of advanced directive(s) in chart?: No - copy requested    Additional Information 1:1 In Past 12 Months?: No CIRT Risk: No Elopement Risk: No Does patient have medical clearance?: Yes     Disposition: Pt does not meet criteria for INPT trmt and is recommended to continue with the OP providers she has already established. Disposition Initial Assessment Completed for this Encounter: Yes (consulted with Elmarie Shiley, NP) Disposition of Patient: Other dispositions Other disposition(s): To current provider (continue f/u with providers already established)  Rexene Edison 02/11/2016 12:11 PM

## 2016-02-11 NOTE — ED Notes (Signed)
Normand Sloop; Transporter, transporting pt to x-ray.

## 2016-02-11 NOTE — ED Notes (Signed)
PA at bedside.

## 2016-02-11 NOTE — ED Provider Notes (Signed)
[facility] DEPT Provider Note   CSN: [id number] Arrival date & time: 02/11/16  [id number]  First Provider Contact:  First MD Initiated Contact with Patient 02/11/16 [phone]        History   Chief Complaint Chief Complaint  Patient presents with  . Back Pain  . Shoulder Pain    HPI [name] is a 82 y.o. female.  HPI   Patient presents with uncontrolled severe depression, upper back pain, constipation.  She and daughter provide the history.  The back pain is only with movement but hurts a lot to move at all.  Denies any injury or falls (did have a fall prior to recent hospitalization, denies injury). Pt was physically and mentally very active 6 weeks ago when she developed depression - has had multiple medication changes over the past few weeks with no improvement.  She is not eating well, has no energy, states "If I felt better, I would want to live."  Denies thoughts of suicide.  Has had constipation x 3 days without abdominal pain.   Wet the bed slightly a few nights ago, which is odd for her - no other urinary symptoms.  Denies fevers, CP, cough, focal weakness or numbness.    Past Medical History:  Diagnosis Date  . Allergy   . Arthritis   . Depression   . Endometriosis   . GERD (gastroesophageal reflux disease)   . Hearing loss 12-08-11   bil  . Hyperlipidemia   . Hypertension   . Osteoporosis   . Ovarian cyst   . S/P right THA, AA 12/16/2011  . Spinal stenosis     Patient Active Problem List   Diagnosis Date Noted  . Bradycardia   . Constipation   . Depression   . MDD (major depressive disorder), recurrent severe, without psychosis ([place])   . Symptomatic bradycardia 01/30/2016  . Symptomatic anemia 01/29/2016  . Hypertension   . Osteoporosis   . Hyperlipidemia   . GERD (gastroesophageal reflux disease)   . Spinal stenosis   . Hearing loss 12/08/2011  . Osteoarthritis of hip 12/02/2011  . Endometriosis   . DIVERTICULOSIS-COLON 02/12/2009  . ALLERGIC  RHINITIS 01/19/2007  . ESOPHAGEAL STRICTURE 01/19/2007    Past Surgical History:  Procedure Laterality Date  . CATARACT EXTRACTION, BILATERAL  12-08-11   bil  . DILATION AND CURETTAGE OF UTERUS    . HERNIA REPAIR    . HYSTEROSCOPY    . OOPHORECTOMY     bilateral  . STERIOD INJECTION  12/09/11   to spine for rt calf pain and spasms  . TIBIA FRACTURE SURGERY     trauma  . TOTAL HIP ARTHROPLASTY  12/16/2011   Procedure: TOTAL HIP ARTHROPLASTY ANTERIOR APPROACH;  Surgeon: Mauri Pole, MD;  Location: [facility];  Service: Orthopedics;  Laterality: Right;    OB History    Gravida Para Term Preterm AB Living   4 4 4     2    SAB TAB Ectopic Multiple Live Births                   Home Medications    Prior to Admission medications   Medication Sig Start Date End Date Taking? Authorizing Provider  acetaminophen (TYLENOL) 325 MG tablet Take 650 mg by mouth every 4 (four) hours as needed for mild pain, moderate pain, fever or headache.     Historical Provider, MD  amLODipine (NORVASC) 5 MG tablet Take 1 tablet (5 mg total) by mouth  daily. 02/03/16   Mir Marry Guan, MD  aspirin EC 81 MG tablet Take 81 mg by mouth daily.    Historical Provider, MD  bisacodyl (DULCOLAX) 5 MG EC tablet Take 1 tablet (5 mg total) by mouth daily as needed for moderate constipation. 02/03/16   Mir Marry Guan, MD  cholecalciferol (VITAMIN D) 1000 UNITS tablet Take 2,000 Units by mouth daily.     Historical Provider, MD  clonazePAM (KLONOPIN) 1 MG tablet Take 0.5-1 mg by mouth 2 (two) times daily as needed for anxiety.    Historical Provider, MD  fluticasone (FLONASE) 50 MCG/ACT nasal spray Place 2 sprays into both nostrils daily.    Historical Provider, MD  Multiple Vitamin (MULTIVITAMIN) tablet Take 1 tablet by mouth daily.      Historical Provider, MD  omeprazole (PRILOSEC) 20 MG capsule Take 1 capsule (20 mg total) by mouth daily. 08/04/14   Lisabeth Pick, MD  ondansetron (ZOFRAN) 4 MG tablet  Take 2-4 mg by mouth every 8 (eight) hours as needed for nausea or vomiting.    Historical Provider, MD  polyethylene glycol (MIRALAX / GLYCOLAX) packet Take 17 g by mouth 2 (two) times daily. 02/03/16   Mir Marry Guan, MD  sertraline (ZOLOFT) 25 MG tablet Take 50 mg by mouth daily.    Historical Provider, MD  simvastatin (ZOCOR) 20 MG tablet TAKE 1 TABLET EVERY DAY Patient taking differently: TAKE 20 MG BY MOUTH EVERY EVENING 09/29/13   Lisabeth Pick, MD  triamcinolone cream (KENALOG) 0.1 % Place 1 application vaginally 2 (two) times daily as needed (for itching).    Historical Provider, MD    Family History Family History  Problem Relation Age of Onset  . Stroke Sister   . Diabetes Daughter   . Cancer Father     esophogeal  . Cancer Paternal Aunt     stomach  . Breast cancer Maternal Grandmother     Age 60    Social History Social History  Substance Use Topics  . Smoking status: Former Research scientist (life sciences)  . Smokeless tobacco: Not on file  . Alcohol use 0.5 oz/week    1 drink(s) per week     Allergies   Lisinopril-hydrochlorothiazide; Mercury; Codeine; and Sulfonamide derivatives   Review of Systems Review of Systems  All other systems reviewed and are negative.    Physical Exam Updated Vital Signs BP 138/84   Pulse 67   Temp 98.6 F (37 C) (Oral)   Resp 16   SpO2 94%   Physical Exam  Constitutional: She appears well-developed and well-nourished. No distress.  HENT:  Head: Normocephalic and atraumatic.  Neck: Neck supple.  Cardiovascular: Normal rate and regular rhythm.   Pulmonary/Chest: Effort normal and breath sounds normal. No respiratory distress. She has no wheezes. She has no rales.  Abdominal: Soft. She exhibits no distension. There is no tenderness. There is no rebound and no guarding.  Genitourinary:  Genitourinary Comments: No stool impaction.  Rectal tone normal.  Nontender.  No blood.    Musculoskeletal:  Spine nontender, no crepitus, or  stepoffs.   Neurological: She is alert. She has normal strength. No sensory deficit. GCS eye subscore is 4. GCS verbal subscore is 5. GCS motor subscore is 6.  Moves all extremities equally though with somewhat restricted use of right arm due to shoulder pain   Skin: She is not diaphoretic.  Psychiatric: She is slowed. She is not actively hallucinating. She exhibits a depressed mood. She expresses no suicidal  ideation. She expresses no suicidal plans. She is attentive.  Nursing note and vitals reviewed.    ED Treatments / Results  Labs (all labs ordered are listed, but only abnormal results are displayed) Labs Reviewed  CBC WITH DIFFERENTIAL/PLATELET - Abnormal; Notable for the following:       Result Value   RBC 3.80 (*)    Hemoglobin 11.7 (*)    HCT 35.7 (*)    Neutro Abs 8.5 (*)    All other components within normal limits  COMPREHENSIVE METABOLIC PANEL - Abnormal; Notable for the following:    Sodium 132 (*)    Chloride 93 (*)    Glucose, Bld 110 (*)    Albumin 3.0 (*)    Alkaline Phosphatase 239 (*)    All other components within normal limits  URINALYSIS, ROUTINE W REFLEX MICROSCOPIC (NOT AT [facility]) - Abnormal; Notable for the following:    APPearance CLOUDY (*)    Ketones, ur 15 (*)    Leukocytes, UA TRACE (*)    All other components within normal limits  URINE MICROSCOPIC-ADD ON - Abnormal; Notable for the following:    Squamous Epithelial / LPF 0-5 (*)    Bacteria, UA RARE (*)    All other components within normal limits    EKG  EKG Interpretation None       Radiology Dg Chest 2 View  Result Date: 02/11/2016 CLINICAL DATA:  Chest pain EXAM: CHEST  2 VIEW COMPARISON:  February 03, 2016 FINDINGS: Lungs are mildly hyperexpanded but clear. There is no edema or consolidation. The heart size and pulmonary vascularity are normal. No adenopathy. There is atherosclerotic calcification in the aortic arch region. No adenopathy. There is degenerative change in each shoulder.  Bones appear osteoporotic. IMPRESSION: Lungs mildly hyperexpanded without edema or consolidation. Aortic atherosclerosis present. Bones osteoporotic. Electronically Signed   By: Lowella Grip III M.D.   On: 02/11/2016 10:16  Dg Shoulder Right  Result Date: 02/11/2016 CLINICAL DATA:  Right shoulder pain for 3 days. EXAM: RIGHT SHOULDER - 2+ VIEW COMPARISON:  None. FINDINGS: There is no evidence of acute fracture or dislocation. Moderate osteoarthritic changes of the glenohumeral joint. Chronic rotator cuff injury is evident by the high-riding humerus. Osteoarthritic changes of the acromioclavicular joint is also seen. Cortical irregularity and callus formation in the distal right clavicle are suggestive of subacute or chronic posttraumatic changes. Soft tissues are unremarkable. IMPRESSION: No radiographic evidence of acute fracture or dislocation identified about the right shoulder. Chronic rotator cuff injury. Osteoarthritic changes of the glenohumeral and acromioclavicular joints. Subacute or chronic posttraumatic changes in the distal right clavicle, incompletely evaluated due to positioning. Electronically Signed   By: Fidela Salisbury M.D.   On: 02/11/2016 14:08  Dg Abd 1 View  Result Date: 02/11/2016 CLINICAL DATA:  Patient with constipation for multiple days. EXAM: ABDOMEN - 1 VIEW COMPARISON:  Pelvic radiograph 12/08/2011 FINDINGS: Lung bases are clear. Gas is demonstrated within nondilated loops of large and small bowel in a nonobstructed pattern. There is a large amount of stool within the distal transverse and descending colon. Lumbar spine degenerative changes. Right hip arthroplasty. Left hip joint degenerative changes. Lumbar spine degenerative changes. IMPRESSION: Nonobstructed bowel gas pattern. Large amount of stool as can be seen with constipation. Electronically Signed   By: Lovey Newcomer M.D.   On: 02/11/2016 13:11   Procedures Procedures (including critical care  time)  Medications Ordered in ED Medications - No data to display   Initial Impression /  Assessment and Plan / ED Course  I have reviewed the triage vital signs and the nursing notes.  Pertinent labs & imaging results that were available during my care of the patient were reviewed by me and considered in my medical decision making (see chart for details).  Clinical Course    Daughter has arranged hospice care for her father, which will help with the stress at home for this patient.  She has also arranged for a geriatric-focused NP to take care of the patient who does home visits, will see her next week.  Patient also has full time caregivers at home.  Daughter thinks that patient would be helped by inpatient psychiatric care.     Afebrile nontoxic patient with diffuse pain across her upper back that hurts with movement, constipation, and severe depression.  Patient's medications have been changed frequently over the past few weeks without improvement of her depression.  She missed her klonopin dose last night and is being weaned off of this.  She had a dramatic change in her pain level after we gave her klonopin here.  Pt does not have stool impact, large amount of stool on xray.  Given enema with large bowel movement in the ED.  Daughter has set up appropriate outpatient care for patient at home.  She did not meet criteria for inpatient psychiatric care.  D/C home with instructions for medication that pt reports she already has at home.  PCP follow up.  Discussed result, findings, treatment, and follow up  with patient.  Pt given return precautions.  Pt verbalizes understanding and agrees with plan.      Final Clinical Impressions(s) / ED Diagnoses   Final diagnoses:  Depression  Constipation, unspecified constipation type  Right shoulder pain  Upper back pain    New Prescriptions New Prescriptions   No medications on file     [place], PA-C 02/11/16 [place],  MD 02/12/16 [phone]

## 2016-02-12 ENCOUNTER — Ambulatory Visit: Payer: 59 | Admitting: Psychology

## 2016-02-14 ENCOUNTER — Observation Stay (HOSPITAL_COMMUNITY)
Admission: EM | Admit: 2016-02-14 | Discharge: 2016-02-17 | Disposition: A | Discharge status: Home / Self Care | Payer: Medicare Other | Attending: Internal Medicine | Admitting: Internal Medicine

## 2016-02-14 ENCOUNTER — Encounter (HOSPITAL_COMMUNITY): Payer: Self-pay

## 2016-02-14 ENCOUNTER — Emergency Department (HOSPITAL_COMMUNITY): Payer: Medicare Other

## 2016-02-14 ENCOUNTER — Ambulatory Visit (INDEPENDENT_AMBULATORY_CARE_PROVIDER_SITE_OTHER): Payer: 59 | Admitting: Psychology

## 2016-02-14 DIAGNOSIS — E785 Hyperlipidemia, unspecified: Secondary | ICD-10-CM | POA: Diagnosis present

## 2016-02-14 DIAGNOSIS — M545 Low back pain: Principal | ICD-10-CM | POA: Insufficient documentation

## 2016-02-14 DIAGNOSIS — K219 Gastro-esophageal reflux disease without esophagitis: Secondary | ICD-10-CM | POA: Diagnosis present

## 2016-02-14 DIAGNOSIS — K829 Disease of gallbladder, unspecified: Secondary | ICD-10-CM

## 2016-02-14 DIAGNOSIS — D649 Anemia, unspecified: Secondary | ICD-10-CM | POA: Diagnosis present

## 2016-02-14 DIAGNOSIS — Z87891 Personal history of nicotine dependence: Secondary | ICD-10-CM | POA: Diagnosis not present

## 2016-02-14 DIAGNOSIS — Z79899 Other long term (current) drug therapy: Secondary | ICD-10-CM | POA: Insufficient documentation

## 2016-02-14 DIAGNOSIS — F419 Anxiety disorder, unspecified: Secondary | ICD-10-CM | POA: Diagnosis present

## 2016-02-14 DIAGNOSIS — I10 Essential (primary) hypertension: Secondary | ICD-10-CM | POA: Diagnosis present

## 2016-02-14 DIAGNOSIS — R509 Fever, unspecified: Secondary | ICD-10-CM | POA: Diagnosis present

## 2016-02-14 DIAGNOSIS — Z7982 Long term (current) use of aspirin: Secondary | ICD-10-CM | POA: Insufficient documentation

## 2016-02-14 DIAGNOSIS — R52 Pain, unspecified: Secondary | ICD-10-CM

## 2016-02-14 DIAGNOSIS — F331 Major depressive disorder, recurrent, moderate: Secondary | ICD-10-CM

## 2016-02-14 DIAGNOSIS — M4800 Spinal stenosis, site unspecified: Secondary | ICD-10-CM | POA: Diagnosis present

## 2016-02-14 DIAGNOSIS — E871 Hypo-osmolality and hyponatremia: Secondary | ICD-10-CM | POA: Diagnosis present

## 2016-02-14 DIAGNOSIS — Z96641 Presence of right artificial hip joint: Secondary | ICD-10-CM | POA: Insufficient documentation

## 2016-02-14 DIAGNOSIS — F329 Major depressive disorder, single episode, unspecified: Secondary | ICD-10-CM | POA: Diagnosis present

## 2016-02-14 DIAGNOSIS — F32A Depression, unspecified: Secondary | ICD-10-CM | POA: Diagnosis present

## 2016-02-14 DIAGNOSIS — K5900 Constipation, unspecified: Secondary | ICD-10-CM | POA: Diagnosis present

## 2016-02-14 DIAGNOSIS — R531 Weakness: Secondary | ICD-10-CM | POA: Diagnosis present

## 2016-02-14 LAB — CBC WITH DIFFERENTIAL/PLATELET
Basophils Absolute: 0 K/uL (ref 0.0–0.1)
Basophils Relative: 0 %
Eosinophils Absolute: 0.2 K/uL (ref 0.0–0.7)
Eosinophils Relative: 2 %
HCT: 31.1 % — ABNORMAL LOW (ref 36.0–46.0)
Hemoglobin: 10.3 g/dL — ABNORMAL LOW (ref 12.0–15.0)
Lymphocytes Relative: 7 %
Lymphs Abs: 0.8 K/uL (ref 0.7–4.0)
MCH: 30.4 pg (ref 26.0–34.0)
MCHC: 33.1 g/dL (ref 30.0–36.0)
MCV: 91.7 fL (ref 78.0–100.0)
Monocytes Absolute: 1.4 K/uL — ABNORMAL HIGH (ref 0.1–1.0)
Monocytes Relative: 12 %
Neutro Abs: 9.4 K/uL — ABNORMAL HIGH (ref 1.7–7.7)
Neutrophils Relative %: 79 %
Platelets: 400 K/uL (ref 150–400)
RBC: 3.39 MIL/uL — ABNORMAL LOW (ref 3.87–5.11)
RDW: 11.5 % (ref 11.5–15.5)
WBC: 11.8 K/uL — ABNORMAL HIGH (ref 4.0–10.5)

## 2016-02-14 LAB — URINALYSIS, ROUTINE W REFLEX MICROSCOPIC
Bilirubin Urine: NEGATIVE
Glucose, UA: NEGATIVE mg/dL
Ketones, ur: NEGATIVE mg/dL
Leukocytes, UA: NEGATIVE
Nitrite: NEGATIVE
Protein, ur: NEGATIVE mg/dL
Specific Gravity, Urine: 1.013 (ref 1.005–1.030)
pH: 5.5 (ref 5.0–8.0)

## 2016-02-14 LAB — COMPREHENSIVE METABOLIC PANEL
ALBUMIN: 2.7 g/dL — AB (ref 3.5–5.0)
ALT: 35 U/L (ref 14–54)
AST: 33 U/L (ref 15–41)
Alkaline Phosphatase: 370 U/L — ABNORMAL HIGH (ref 38–126)
Anion gap: 10 (ref 5–15)
BILIRUBIN TOTAL: 0.5 mg/dL (ref 0.3–1.2)
BUN: 15 mg/dL (ref 6–20)
CO2: 27 mmol/L (ref 22–32)
Calcium: 8.6 mg/dL — ABNORMAL LOW (ref 8.9–10.3)
Chloride: 91 mmol/L — ABNORMAL LOW (ref 101–111)
Creatinine, Ser: 0.72 mg/dL (ref 0.44–1.00)
GFR calc Af Amer: >60 mL/min (ref >60)
GFR calc non Af Amer: >60 mL/min (ref >60)
GLUCOSE: 114 mg/dL — AB (ref 65–99)
POTASSIUM: 3.9 mmol/L (ref 3.5–5.1)
Sodium: 128 mmol/L — ABNORMAL LOW (ref 135–145)
TOTAL PROTEIN: 6.9 g/dL (ref 6.5–8.1)

## 2016-02-14 LAB — I-STAT CG4 LACTIC ACID, ED
Lactic Acid, Venous: 0.83 mmol/L (ref 0.5–1.9)
Lactic Acid, Venous: 0.66 mmol/L (ref 0.5–1.9)

## 2016-02-14 LAB — LIPASE, BLOOD: Lipase: 13 U/L (ref 11–51)

## 2016-02-14 LAB — URINE MICROSCOPIC-ADD ON

## 2016-02-14 MED ORDER — SODIUM CHLORIDE 0.9 % IV BOLUS (SEPSIS)
500.0000 mL | Freq: Once | INTRAVENOUS | Status: AC
  Administered 2016-02-14: 500 mL via INTRAVENOUS

## 2016-02-14 MED ORDER — FENTANYL CITRATE (PF) 100 MCG/2ML IJ SOLN
12.5000 ug | Freq: Once | INTRAMUSCULAR | Status: AC
  Administered 2016-02-14: 12.5 ug via INTRAVENOUS
  Filled 2016-02-14 (×2): qty 2

## 2016-02-14 MED ORDER — IPRATROPIUM-ALBUTEROL 0.5-2.5 (3) MG/3ML IN SOLN: 3.0000 mL | Freq: Four times a day (QID) | RESPIRATORY_TRACT | Status: DC | PRN

## 2016-02-14 NOTE — ED Triage Notes (Signed)
Per EMS - pt presents w/ general weakness x 1 month and back pain. Pt coming from PCP to be further evaluated. At PCP office, BP lying 109/65, sitting 88/58, standing 88/56 and temp 100.1. Recently admitted for bradycardia at [facility] and no findings for weakness.

## 2016-02-14 NOTE — ED Notes (Addendum)
Pt has also been recently seen at [facility] and states she has been "crippled w/ anxiety and depression since her husband was placed on hospice." Also c/o constipation w/ mild relief taking miralax.

## 2016-02-14 NOTE — ED Notes (Signed)
Patient transported to CT 

## 2016-02-14 NOTE — ED Notes (Signed)
Please call daughter [name] to confirm admission or dispo  Cell: [phone] Home: [phone]

## 2016-02-14 NOTE — ED Notes (Signed)
Ice chips provided per Dr. Audie Pinto.  No solid food for now.

## 2016-02-14 NOTE — ED Notes (Signed)
Pt returned from MRI, placed back on monitor and hearing aids returned to pt's ears.

## 2016-02-14 NOTE — ED Provider Notes (Signed)
[facility] DEPT Provider Note   CSN: [id number] Arrival date & time: 02/14/16  1737  First Provider Contact:  None       History   Chief Complaint Chief Complaint  Patient presents with  . Back Pain  . Weakness    HPI [name] is a 82 y.o. female.  HPI pt presents w/ general weakness x 1 month and worsening back pain. Pt coming from PCP to be further evaluated. At PCP office, BP lying 109/65, sitting 88/58, standing 88/56 and temp 100.1. Recently admitted for bradycardia at [facility] and no findings for weakness. Had temp to 101.8 yesterday.  Pain started in low back.     Past Medical History:  Diagnosis Date  . Allergy   . Arthritis   . Depression   . Endometriosis   . GERD (gastroesophageal reflux disease)   . Hearing loss 12-08-11   bil  . Hyperlipidemia   . Hypertension   . Osteoporosis   . Ovarian cyst   . S/P right THA, AA 12/16/2011  . Spinal stenosis     Patient Active Problem List   Diagnosis Date Noted  . Bradycardia   . Constipation   . Depression   . MDD (major depressive disorder), recurrent severe, without psychosis ([place])   . Symptomatic bradycardia 01/30/2016  . Symptomatic anemia 01/29/2016  . Hypertension   . Osteoporosis   . Hyperlipidemia   . GERD (gastroesophageal reflux disease)   . Spinal stenosis   . Hearing loss 12/08/2011  . Osteoarthritis of hip 12/02/2011  . Endometriosis   . DIVERTICULOSIS-COLON 02/12/2009  . ALLERGIC RHINITIS 01/19/2007  . ESOPHAGEAL STRICTURE 01/19/2007    Past Surgical History:  Procedure Laterality Date  . CATARACT EXTRACTION, BILATERAL  12-08-11   bil  . DILATION AND CURETTAGE OF UTERUS    . HERNIA REPAIR    . HYSTEROSCOPY    . OOPHORECTOMY     bilateral  . STERIOD INJECTION  12/09/11   to spine for rt calf pain and spasms  . TIBIA FRACTURE SURGERY     trauma  . TOTAL HIP ARTHROPLASTY  12/16/2011   Procedure: TOTAL HIP ARTHROPLASTY ANTERIOR APPROACH;  Surgeon: Mauri Pole, MD;  Location: [facility]  ORS;  Service: Orthopedics;  Laterality: Right;    OB History    Gravida Para Term Preterm AB Living   4 4 4     2    SAB TAB Ectopic Multiple Live Births                   Home Medications    Prior to Admission medications   Medication Sig Start Date End Date Taking? Authorizing Provider  acetaminophen (TYLENOL) 325 MG tablet Take 650 mg by mouth every 4 (four) hours as needed for mild pain, moderate pain, fever or headache.    Yes Historical Provider, MD  amLODipine (NORVASC) 5 MG tablet Take 1 tablet (5 mg total) by mouth daily. 02/03/16  Yes Mir Marry Guan, MD  aspirin EC 81 MG tablet Take 81 mg by mouth daily.   Yes Historical Provider, MD  bisacodyl (DULCOLAX) 5 MG EC tablet Take 1 tablet (5 mg total) by mouth daily as needed for moderate constipation. 02/03/16  Yes Mir Marry Guan, MD  cholecalciferol (VITAMIN D) 1000 UNITS tablet Take 2,000 Units by mouth daily.    Yes Historical Provider, MD  clonazePAM (KLONOPIN) 1 MG tablet Take 0.5 mg by mouth 3 (three) times daily as needed for anxiety.  Yes Historical Provider, MD  fluticasone (FLONASE) 50 MCG/ACT nasal spray Place 2 sprays into both nostrils daily as needed for allergies.    Yes Historical Provider, MD  Multiple Vitamin (MULTIVITAMIN) tablet Take 1 tablet by mouth daily.     Yes Historical Provider, MD  omeprazole (PRILOSEC) 20 MG capsule Take 1 capsule (20 mg total) by mouth daily. Patient taking differently: Take 20 mg by mouth daily as needed (for heartburn).  08/04/14  Yes Bruce Kendall Flack, MD  ondansetron (ZOFRAN) 4 MG tablet Take 2-4 mg by mouth every 8 (eight) hours as needed for nausea or vomiting.   Yes Historical Provider, MD  polyethylene glycol (MIRALAX / GLYCOLAX) packet Take 17 g by mouth 2 (two) times daily. 02/03/16  Yes Mir Marry Guan, MD  sertraline (ZOLOFT) 25 MG tablet Take 75 mg by mouth daily.    Yes Historical Provider, MD  simvastatin (ZOCOR) 20 MG tablet TAKE 1 TABLET EVERY  DAY Patient taking differently: TAKE 20 MG BY MOUTH EVERY EVENING 09/29/13  Yes Bruce Kendall Flack, MD  traMADol (ULTRAM) 50 MG tablet Take 50 mg by mouth 2 (two) times daily.   Yes Historical Provider, MD  triamcinolone cream (KENALOG) 0.1 % Place 1 application vaginally 2 (two) times daily as needed (for itching).   Yes Historical Provider, MD    Family History Family History  Problem Relation Age of Onset  . Stroke Sister   . Diabetes Daughter   . Cancer Father     esophogeal  . Cancer Paternal Aunt     stomach  . Breast cancer Maternal Grandmother     Age 74    Social History Social History  Substance Use Topics  . Smoking status: Former Research scientist (life sciences)  . Smokeless tobacco: Never Used  . Alcohol use 0.5 oz/week    1 Standard drinks or equivalent per week     Allergies   Cartia xt [diltiazem]; Codeine; Lisinopril-hydrochlorothiazide; Mercury; and Sulfonamide derivatives   Review of Systems Review of Systems  Constitutional: Positive for chills, fatigue and fever.  All other systems reviewed and are negative.    Physical Exam Updated Vital Signs BP 146/69 (BP Location: Right Arm)   Pulse 66   Temp 97 F (36.1 C) (Rectal)   Resp 16   SpO2 98%   Physical Exam  Constitutional: She is oriented to person, place, and time. She appears well-developed and well-nourished. No distress.  HENT:  Head: Normocephalic and atraumatic.  Eyes: Pupils are equal, round, and reactive to light.  Neck: Normal range of motion.  Cardiovascular: Normal rate and intact distal pulses.   Pulmonary/Chest: No respiratory distress.  Abdominal: Soft. Normal appearance. She exhibits no distension and no mass. There is no tenderness. There is no guarding.  Musculoskeletal: Normal range of motion.       Back:  Neurological: She is alert and oriented to person, place, and time. No cranial nerve deficit.  Skin: Skin is warm and dry. No rash noted.  Psychiatric: She has a normal mood and affect. Her  behavior is normal.  Nursing note and vitals reviewed.    ED Treatments / Results  Labs (all labs ordered are listed, but only abnormal results are displayed) Labs Reviewed  CBC WITH DIFFERENTIAL/PLATELET - Abnormal; Notable for the following:       Result Value   WBC 11.8 (*)    RBC 3.39 (*)    Hemoglobin 10.3 (*)    HCT 31.1 (*)    Neutro Abs 9.4 (*)  Monocytes Absolute 1.4 (*)    All other components within normal limits  COMPREHENSIVE METABOLIC PANEL - Abnormal; Notable for the following:    Sodium 128 (*)    Chloride 91 (*)    Glucose, Bld 114 (*)    Calcium 8.6 (*)    Albumin 2.7 (*)    Alkaline Phosphatase 370 (*)    All other components within normal limits  URINALYSIS, ROUTINE W REFLEX MICROSCOPIC (NOT AT [facility]) - Abnormal; Notable for the following:    Hgb urine dipstick TRACE (*)    All other components within normal limits  URINE MICROSCOPIC-ADD ON - Abnormal; Notable for the following:    Squamous Epithelial / LPF 0-5 (*)    Bacteria, UA RARE (*)    All other components within normal limits  URINE CULTURE  CULTURE, BLOOD (ROUTINE X 2)  CULTURE, BLOOD (ROUTINE X 2)  LIPASE, BLOOD  I-STAT CG4 LACTIC ACID, ED  I-STAT CG4 LACTIC ACID, ED    EKG  EKG Interpretation  Date/Time:  Thursday February 14 2016 17:44:55 EDT Ventricular Rate:  59 PR Interval:    QRS Duration: 88 QT Interval:  423 QTC Calculation: 419 R Axis:   5 Text Interpretation:  Sinus rhythm Probable left atrial enlargement No significant change since last tracing Confirmed by Stellan Vick  MD, Navada Osterhout ([id number]) on 02/14/2016 5:52:40 PM       Radiology Mr Lumbar Spine Wo Contrast  Result Date: 02/14/2016 CLINICAL DATA:  Initial evaluation for acute back pain. EXAM: MRI LUMBAR SPINE WITHOUT CONTRAST TECHNIQUE: Multiplanar, multisequence MR imaging of the lumbar spine was performed. No intravenous contrast was administered. COMPARISON:  Prior MRI from 12/10/2008. FINDINGS: Segmentation: Normal  segmentation. Lowest well-formed disc is presumed to be the L5-S1 level. Alignment: Trace anterolisthesis of L3 on L4, L4 on L5, and L5 on S1, stable. Vertebral bodies otherwise normally aligned with preservation of the normal lumbar lordosis. Vertebrae: Vertebral body heights maintained. No acute or chronic fracture. Signal intensity within the vertebral body bone marrow somewhat heterogeneous without worrisome osseous lesion. Few scattered benign hemangiomas noted. No marrow edema. Conus medullaris: Extends to the L1 level and appears normal. Paraspinal and other soft tissues: Paraspinous soft tissues demonstrate no acute abnormality. Mild fatty atrophy noted within the paraspinous musculature. Disc levels: T11-12: Seen only on sagittal projection. Disc desiccation with mild diffuse disc bulge. No stenosis. T12-L1: Seen only on sagittal projection. Disc desiccation with mild diffuse disc bulge without focal disc herniation. No significant stenosis. L1-2: Mild diffuse disc bulge with disc desiccation and intervertebral disc space narrowing. Mild bile facet arthrosis with ligamentum flavum hypertrophy. Mild canal stenosis is stable from prior. No significant foraminal encroachment. L2-3: Mild diffuse disc bulge with disc desiccation intervertebral disc space narrowing, slightly progressed from previous. Bilateral facet arthrosis ligamentum flavum hypertrophy, also progressed. Resultant mild canal stenosis is slightly worsened. No significant foraminal encroachment. L3-4: Trace anterolisthesis of L3 and L4. Resultant mild diffuse disc bulge with disc desiccation. Bilateral facet arthrosis with ligamentum flavum hypertrophy, slightly progressed. Resultant mild canal and lateral recess stenosis, slightly progressed. Mild bilateral foraminal stenosis out frank nerve root impingement. L4-5: Trace anterolisthesis of L4 on L5. Broad-based shallow central disc protrusion indents upon the ventral thecal sac. Progressive  facet and ligamentum flavum hypertrophy. Slightly worsened lateral recess stenosis with mild to moderate canal narrowing. Mild bilateral foraminal stenosis, left greater than right is similar. L5-S1: Trace anterolisthesis of L5 on S1. Shallow central disc protrusion indents upon the ventral thecal sac without  significant stenosis. No neural impingement. Moderate bilateral facet arthrosis, similar to previous. Foramina remain widely patent. IMPRESSION: 1. No acute abnormality within the lumbar spine. 2. Broad base disc protrusion at L4-5 with superimposed facet disease, resulting in mild to moderate canal and lateral recess stenosis, mildly worsened from previous. 3. Additional more mild multilevel degenerative spondylolysis and facet disease as detailed above, overall slightly progressed relative to most recent MRI from 12/10/2008. No high-grade spinal stenosis or evidence of nerve root compression. Electronically Signed   By: Jeannine Boga M.D.   On: 02/14/2016 22:34   Procedures Procedures (including critical care time)  Medications Ordered in ED Medications  sodium chloride 0.9 % bolus 500 mL (0 mLs Intravenous Stopped 02/14/16 1930)  fentaNYL (SUBLIMAZE) injection 12.5 mcg (12.5 mcg Intravenous Given 02/14/16 2130)     Initial Impression / Assessment and Plan / ED Course  I have reviewed the triage vital signs and the nursing notes.  Pertinent labs & imaging results that were available during my care of the patient were reviewed by me and considered in my medical decision making (see chart for details).  Clinical Course  Value Comment By Time  WBC: (!) 11.8 (Reviewed) Leonard Schwartz, MD 07/27 1936   Will do CT abdomen to evaluate for possible gallbladder or diverticular dz. Leonard Schwartz, MD 07/27 2303    Source of fever is unknown.  Will admit for further evaluation and workup.  Final Clinical Impressions(s) / ED Diagnoses   Final diagnoses:  Pain  Fever    New  Prescriptions New Prescriptions   No medications on file     Leonard Schwartz, MD 02/14/16 2325

## 2016-02-14 NOTE — ED Notes (Signed)
MD at bedside. Hospitalist

## 2016-02-14 NOTE — ED Notes (Signed)
Dr. Audie Pinto now at bedside.

## 2016-02-14 NOTE — ED Notes (Signed)
MD at bedside talking to family

## 2016-02-14 NOTE — ED Notes (Signed)
Pt up to bed-side commode with assistance.

## 2016-02-14 NOTE — ED Notes (Signed)
Patient transported to MRI 

## 2016-02-15 ENCOUNTER — Observation Stay (HOSPITAL_COMMUNITY): Payer: Medicare Other

## 2016-02-15 ENCOUNTER — Emergency Department (HOSPITAL_COMMUNITY): Payer: Medicare Other

## 2016-02-15 ENCOUNTER — Encounter (HOSPITAL_COMMUNITY): Payer: Self-pay | Admitting: Radiology

## 2016-02-15 ENCOUNTER — Ambulatory Visit: Payer: 59 | Admitting: Psychology

## 2016-02-15 DIAGNOSIS — D649 Anemia, unspecified: Secondary | ICD-10-CM | POA: Diagnosis present

## 2016-02-15 DIAGNOSIS — E871 Hypo-osmolality and hyponatremia: Secondary | ICD-10-CM | POA: Diagnosis present

## 2016-02-15 DIAGNOSIS — R509 Fever, unspecified: Secondary | ICD-10-CM | POA: Diagnosis not present

## 2016-02-15 DIAGNOSIS — F329 Major depressive disorder, single episode, unspecified: Secondary | ICD-10-CM

## 2016-02-15 DIAGNOSIS — I10 Essential (primary) hypertension: Secondary | ICD-10-CM

## 2016-02-15 DIAGNOSIS — F419 Anxiety disorder, unspecified: Secondary | ICD-10-CM | POA: Diagnosis present

## 2016-02-15 LAB — CBC WITH DIFFERENTIAL/PLATELET
BASOS ABS: 0 10*3/uL (ref 0.0–0.1)
BASOS PCT: 0 %
Eosinophils Absolute: 0.3 10*3/uL (ref 0.0–0.7)
Eosinophils Relative: 3 %
HEMATOCRIT: 36.1 % (ref 36.0–46.0)
Hemoglobin: 11.8 g/dL — ABNORMAL LOW (ref 12.0–15.0)
LYMPHS PCT: 11 %
Lymphs Abs: 1.3 10*3/uL (ref 0.7–4.0)
MCH: 30.6 pg (ref 26.0–34.0)
MCHC: 32.7 g/dL (ref 30.0–36.0)
MCV: 93.5 fL (ref 78.0–100.0)
Monocytes Absolute: 1.3 10*3/uL — ABNORMAL HIGH (ref 0.1–1.0)
Monocytes Relative: 11 %
NEUTROS ABS: 8.3 10*3/uL — AB (ref 1.7–7.7)
Neutrophils Relative %: 75 %
PLATELETS: 420 10*3/uL — AB (ref 150–400)
RBC: 3.86 MIL/uL — AB (ref 3.87–5.11)
RDW: 11.5 % (ref 11.5–15.5)
WBC: 11.1 10*3/uL — AB (ref 4.0–10.5)

## 2016-02-15 LAB — COMPREHENSIVE METABOLIC PANEL
ALBUMIN: 2.9 g/dL — AB (ref 3.5–5.0)
ALT: 34 U/L (ref 14–54)
AST: 29 U/L (ref 15–41)
Alkaline Phosphatase: 370 U/L — ABNORMAL HIGH (ref 38–126)
Anion gap: 12 (ref 5–15)
BILIRUBIN TOTAL: 0.5 mg/dL (ref 0.3–1.2)
BUN: 11 mg/dL (ref 6–20)
CHLORIDE: 96 mmol/L — AB (ref 101–111)
CO2: 24 mmol/L (ref 22–32)
CREATININE: 0.62 mg/dL (ref 0.44–1.00)
Calcium: 8.9 mg/dL (ref 8.9–10.3)
GFR calc Af Amer: >60 mL/min (ref >60)
GLUCOSE: 107 mg/dL — AB (ref 65–99)
POTASSIUM: 3.9 mmol/L (ref 3.5–5.1)
Sodium: 132 mmol/L — ABNORMAL LOW (ref 135–145)
Total Protein: 7.3 g/dL (ref 6.5–8.1)

## 2016-02-15 LAB — SODIUM, URINE, RANDOM: Sodium, Ur: 55 mmol/L

## 2016-02-15 LAB — SEDIMENTATION RATE: Sed Rate: 120 mm/hr — ABNORMAL HIGH (ref 0–22)

## 2016-02-15 LAB — GLUCOSE, CAPILLARY: GLUCOSE-CAPILLARY: 153 mg/dL — AB (ref 65–99)

## 2016-02-15 LAB — URINE CULTURE: CULTURE: NO GROWTH

## 2016-02-15 LAB — C-REACTIVE PROTEIN: CRP: 17.4 mg/dL — AB (ref <1.0)

## 2016-02-15 MED ORDER — TRAMADOL HCL 50 MG PO TABS
50.0000 mg | ORAL_TABLET | Freq: Two times a day (BID) | ORAL | Status: DC
  Administered 2016-02-15 – 2016-02-17 (×6): 50 mg via ORAL
  Filled 2016-02-15 (×6): qty 1

## 2016-02-15 MED ORDER — ONDANSETRON HCL 4 MG PO TABS: 4.0000 mg | ORAL_TABLET | Freq: Four times a day (QID) | ORAL | Status: DC | PRN

## 2016-02-15 MED ORDER — ONDANSETRON HCL 4 MG/2ML IJ SOLN: 4.0000 mg | Freq: Four times a day (QID) | INTRAMUSCULAR | Status: DC | PRN

## 2016-02-15 MED ORDER — TECHNETIUM TC 99M MEBROFENIN IV KIT
5.0000 | PACK | Freq: Once | INTRAVENOUS | Status: AC | PRN
  Administered 2016-02-15: 5 via INTRAVENOUS

## 2016-02-15 MED ORDER — IPRATROPIUM-ALBUTEROL 0.5-2.5 (3) MG/3ML IN SOLN
3.0000 mL | Freq: Two times a day (BID) | RESPIRATORY_TRACT | Status: DC
  Filled 2016-02-15: qty 3

## 2016-02-15 MED ORDER — SODIUM CHLORIDE 0.9% FLUSH
3.0000 mL | Freq: Two times a day (BID) | INTRAVENOUS | Status: DC
  Administered 2016-02-15 – 2016-02-17 (×4): 3 mL via INTRAVENOUS

## 2016-02-15 MED ORDER — IPRATROPIUM-ALBUTEROL 0.5-2.5 (3) MG/3ML IN SOLN
3.0000 mL | Freq: Four times a day (QID) | RESPIRATORY_TRACT | Status: DC
Start: 2016-02-15 — End: 2016-02-15
  Administered 2016-02-15 (×2): 3 mL via RESPIRATORY_TRACT
  Filled 2016-02-15 (×2): qty 3

## 2016-02-15 MED ORDER — SERTRALINE HCL 50 MG PO TABS
75.0000 mg | ORAL_TABLET | Freq: Every day | ORAL | Status: DC
  Administered 2016-02-15 – 2016-02-17 (×3): 75 mg via ORAL
  Filled 2016-02-15 (×3): qty 2

## 2016-02-15 MED ORDER — BISACODYL 5 MG PO TBEC
5.0000 mg | DELAYED_RELEASE_TABLET | Freq: Every day | ORAL | Status: DC | PRN
  Filled 2016-02-15: qty 1

## 2016-02-15 MED ORDER — POLYETHYLENE GLYCOL 3350 17 G PO PACK
17.0000 g | PACK | Freq: Two times a day (BID) | ORAL | Status: DC
  Administered 2016-02-15 – 2016-02-17 (×3): 17 g via ORAL
  Filled 2016-02-15 (×3): qty 1

## 2016-02-15 MED ORDER — ENSURE ENLIVE PO LIQD
237.0000 mL | Freq: Two times a day (BID) | ORAL | Status: DC
  Administered 2016-02-16 – 2016-02-17 (×3): 237 mL via ORAL

## 2016-02-15 MED ORDER — SODIUM CHLORIDE 0.9 % IV SOLN
INTRAVENOUS | Status: DC
  Administered 2016-02-15: 07:00:00 via INTRAVENOUS

## 2016-02-15 MED ORDER — IOPAMIDOL (ISOVUE-300) INJECTION 61%
INTRAVENOUS | Status: AC
  Administered 2016-02-15: 100 mL
  Filled 2016-02-15: qty 100

## 2016-02-15 MED ORDER — CLONAZEPAM 0.5 MG PO TABS
0.5000 mg | ORAL_TABLET | Freq: Three times a day (TID) | ORAL | Status: DC | PRN
  Administered 2016-02-15 – 2016-02-17 (×5): 0.5 mg via ORAL
  Filled 2016-02-15 (×5): qty 1

## 2016-02-15 MED ORDER — FLUTICASONE PROPIONATE 50 MCG/ACT NA SUSP: 2.0000 | Freq: Every day | NASAL | Status: DC | PRN

## 2016-02-15 MED ORDER — ENOXAPARIN SODIUM 40 MG/0.4ML ~~LOC~~ SOLN
40.0000 mg | SUBCUTANEOUS | Status: DC
  Administered 2016-02-15 – 2016-02-17 (×3): 40 mg via SUBCUTANEOUS
  Filled 2016-02-15 (×3): qty 0.4

## 2016-02-15 MED ORDER — PREDNISONE 20 MG PO TABS
20.0000 mg | ORAL_TABLET | Freq: Every day | ORAL | Status: DC
  Administered 2016-02-15 – 2016-02-16 (×2): 20 mg via ORAL
  Filled 2016-02-15 (×2): qty 1

## 2016-02-15 MED ORDER — ASPIRIN EC 81 MG PO TBEC
81.0000 mg | DELAYED_RELEASE_TABLET | Freq: Every day | ORAL | Status: DC
  Administered 2016-02-15 – 2016-02-17 (×3): 81 mg via ORAL
  Filled 2016-02-15 (×3): qty 1

## 2016-02-15 MED ORDER — ADULT MULTIVITAMIN W/MINERALS CH
1.0000 | ORAL_TABLET | Freq: Every day | ORAL | Status: DC
  Administered 2016-02-15 – 2016-02-17 (×3): 1 via ORAL
  Filled 2016-02-15 (×3): qty 1

## 2016-02-15 MED ORDER — VITAMIN D 1000 UNITS PO TABS
2000.0000 [IU] | ORAL_TABLET | Freq: Every day | ORAL | Status: DC
  Administered 2016-02-15 – 2016-02-17 (×3): 2000 [IU] via ORAL
  Filled 2016-02-15 (×3): qty 2

## 2016-02-15 MED ORDER — ACETAMINOPHEN 325 MG PO TABS
650.0000 mg | ORAL_TABLET | ORAL | Status: DC | PRN
  Administered 2016-02-15: 650 mg via ORAL
  Filled 2016-02-15: qty 2

## 2016-02-15 MED ORDER — PANTOPRAZOLE SODIUM 40 MG PO TBEC
40.0000 mg | DELAYED_RELEASE_TABLET | Freq: Every day | ORAL | Status: DC
  Administered 2016-02-15 – 2016-02-17 (×3): 40 mg via ORAL
  Filled 2016-02-15 (×3): qty 1

## 2016-02-15 MED ORDER — ALBUTEROL SULFATE (2.5 MG/3ML) 0.083% IN NEBU: 2.5000 mg | INHALATION_SOLUTION | RESPIRATORY_TRACT | Status: DC | PRN

## 2016-02-15 NOTE — H&P (Addendum)
History and Physical    [name] [id number] DOB: 1922-09-14 DOA: 02/14/2016  PCP: [name], MD   Patient coming from: PCPs office.  Chief Complaint: Fever, weakness and orthostatic low blood pressure.  HPI: [name] is a 82 y.o. female known to me from her recent admission due to symptomatic bradycardia who has a medical history significant of seasonal allergies, osteoarthritis, depression, endometriosis, GERD, hearing loss (he uses hearing aids), hyperlipidemia, hypertension, osteoporosis, spinal stenosis who was referred by her PCP due to above complaints.   Per patient, she has been having worsening back pain for the past month. She has been having low-grade temperatures for the past few days, but denies earache, sore throat, rhinorrhea, nausea, emesis, diarrhea, dysuria or hematuria. She denies chest pain, dyspnea, palpitations, diaphoresis, pitting edema of the lower extremities.   ED Course: Workup shows mild anemia, mild leukocytosis, hyponatremia 128 mmol per liter. Chest x-ray does not show any acute cardiopulmonary process, MRI of the LS spine shows mild worsening of protrusion of L4-5 disc and associated findings, when compared to MRI done in 2010. CT scan of the abdomen shows a distended gallbladder.  Review of Systems: As per HPI otherwise 10 point review of systems negative.   Past Medical History:  Diagnosis Date  . Allergy   . Arthritis   . Depression   . Endometriosis   . GERD (gastroesophageal reflux disease)   . Hearing loss 12-08-11   bil  . Hyperlipidemia   . Hypertension   . Osteoporosis   . Ovarian cyst   . S/P right THA, AA 12/16/2011  . Spinal stenosis     Past Surgical History:  Procedure Laterality Date  . CATARACT EXTRACTION, BILATERAL  12-08-11   bil  . DILATION AND CURETTAGE OF UTERUS    . HERNIA REPAIR    . HYSTEROSCOPY    . OOPHORECTOMY     bilateral  . STERIOD INJECTION  12/09/11   to spine for rt calf pain and spasms  .  TIBIA FRACTURE SURGERY     trauma  . TOTAL HIP ARTHROPLASTY  12/16/2011   Procedure: TOTAL HIP ARTHROPLASTY ANTERIOR APPROACH;  Surgeon: Mauri Pole, MD;  Location: [facility];  Service: Orthopedics;  Laterality: Right;     reports that she has quit smoking. She has never used smokeless tobacco. She reports that she drinks about 0.5 oz of alcohol per week . She reports that she does not use drugs.  Allergies  Allergen Reactions  . Cartia Xt [Diltiazem] Other (See Comments)    BRADYCARDIA IN 40'S  . Codeine Other (See Comments)    syncope  . Lisinopril-Hydrochlorothiazide Cough  . Mercury Rash  . Sulfonamide Derivatives Rash    Family History  Problem Relation Age of Onset  . Stroke Sister   . Diabetes Daughter   . Cancer Father     esophogeal  . Cancer Paternal Aunt     stomach  . Breast cancer Maternal Grandmother     Age 18    Prior to Admission medications   Medication Sig Start Date End Date Taking? Authorizing Provider  acetaminophen (TYLENOL) 325 MG tablet Take 650 mg by mouth every 4 (four) hours as needed for mild pain, moderate pain, fever or headache.    Yes Historical Provider, MD  amLODipine (NORVASC) 5 MG tablet Take 1 tablet (5 mg total) by mouth daily. 02/03/16  Yes Mir Marry Guan, MD  aspirin EC 81 MG tablet Take 81 mg by  mouth daily.   Yes Historical Provider, MD  bisacodyl (DULCOLAX) 5 MG EC tablet Take 1 tablet (5 mg total) by mouth daily as needed for moderate constipation. 02/03/16  Yes Mir Marry Guan, MD  cholecalciferol (VITAMIN D) 1000 UNITS tablet Take 2,000 Units by mouth daily.    Yes Historical Provider, MD  clonazePAM (KLONOPIN) 1 MG tablet Take 0.5 mg by mouth 3 (three) times daily as needed for anxiety.    Yes Historical Provider, MD  fluticasone (FLONASE) 50 MCG/ACT nasal spray Place 2 sprays into both nostrils daily as needed for allergies.    Yes Historical Provider, MD  Multiple Vitamin (MULTIVITAMIN) tablet Take 1 tablet by  mouth daily.     Yes Historical Provider, MD  omeprazole (PRILOSEC) 20 MG capsule Take 1 capsule (20 mg total) by mouth daily. Patient taking differently: Take 20 mg by mouth daily as needed (for heartburn).  08/04/14  Yes Bruce Kendall Flack, MD  ondansetron (ZOFRAN) 4 MG tablet Take 2-4 mg by mouth every 8 (eight) hours as needed for nausea or vomiting.   Yes Historical Provider, MD  polyethylene glycol (MIRALAX / GLYCOLAX) packet Take 17 g by mouth 2 (two) times daily. 02/03/16  Yes Mir Marry Guan, MD  sertraline (ZOLOFT) 25 MG tablet Take 75 mg by mouth daily.    Yes Historical Provider, MD  simvastatin (ZOCOR) 20 MG tablet TAKE 1 TABLET EVERY DAY Patient taking differently: TAKE 20 MG BY MOUTH EVERY EVENING 09/29/13  Yes Bruce Kendall Flack, MD  traMADol (ULTRAM) 50 MG tablet Take 50 mg by mouth 2 (two) times daily.   Yes Historical Provider, MD  triamcinolone cream (KENALOG) 0.1 % Place 1 application vaginally 2 (two) times daily as needed (for itching).   Yes Historical Provider, MD    Physical Exam: Vitals:   02/14/16 1900 02/14/16 2030 02/14/16 2100 02/14/16 2233  BP: 146/78 149/73 153/70 146/69  Pulse: 61 63 65 66  Resp: 20 18 16 16   Temp:      TempSrc:      SpO2: 97% 95% 98% 98%      Constitutional: NAD, calm, comfortable Vitals:   02/14/16 1900 02/14/16 2030 02/14/16 2100 02/14/16 2233  BP: 146/78 149/73 153/70 146/69  Pulse: 61 63 65 66  Resp: 20 18 16 16   Temp:      TempSrc:      SpO2: 97% 95% 98% 98%   Eyes: PERRL, lids and conjunctivae normal ENMT: Mucous membranes are moist. Posterior pharynx clear of any exudate or lesions. Neck: normal, supple, no masses, no thyromegaly Respiratory: Clear to auscultation bilaterally, no wheezing, no crackles. Normal respiratory effort. No accessory muscle use.  Cardiovascular: Regular rate and rhythm, no murmurs / rubs / gallops. No extremity edema. 2+ pedal pulses. No carotid bruits.  Abdomen: BS positive. No tenderness, no  masses palpated. No hepatosplenomegaly.  Musculoskeletal: no clubbing / cyanosis. Good ROM, no contractures. Normal muscle tone.  Skin: Healing skin lesions on lower extremities from dermatological treatment. Neurologic: CN 2-12 grossly intact. Sensation intact, DTR normal. Strength 5/5 in all 4.  Psychiatric: Normal judgment and insight. Alert and oriented x 4. Normal mood.     Labs on Admission: I have personally reviewed following labs and imaging studies  CBC:  Recent Labs Lab 02/11/16 0955 02/14/16 1807  WBC 10.5 11.8*  NEUTROABS 8.5* 9.4*  HGB 11.7* 10.3*  HCT 35.7* 31.1*  MCV 93.9 91.7  PLT 335 [id number]   Basic Metabolic Panel:  Recent Labs Lab  02/11/16 0955 02/14/16 1807  NA 132* 128*  K 3.8 3.9  CL 93* 91*  CO2 30 27  GLUCOSE 110* 114*  BUN 13 15  CREATININE 0.69 0.72  CALCIUM 9.2 8.6*   GFR: CrCl cannot be calculated (Unknown ideal weight.). Liver Function Tests:  Recent Labs Lab 02/11/16 0955 02/14/16 1807  AST 18 33  ALT 26 35  ALKPHOS 239* 370*  BILITOT 0.7 0.5  PROT 7.1 6.9  ALBUMIN 3.0* 2.7*    Recent Labs Lab 02/14/16 1807  LIPASE 13   Urine analysis:    Component Value Date/Time   COLORURINE YELLOW 02/14/2016 1844   APPEARANCEUR CLEAR 02/14/2016 1844   LABSPEC 1.013 02/14/2016 1844   PHURINE 5.5 02/14/2016 1844   GLUCOSEU NEGATIVE 02/14/2016 1844   HGBUR TRACE (A) 02/14/2016 1844   BILIRUBINUR NEGATIVE 02/14/2016 1844   BILIRUBINUR n 12/06/2010   KETONESUR NEGATIVE 02/14/2016 1844   PROTEINUR NEGATIVE 02/14/2016 1844   UROBILINOGEN 0.2 12/19/2011 1104   NITRITE NEGATIVE 02/14/2016 1844   LEUKOCYTESUR NEGATIVE 02/14/2016 1844    Radiological Exams on Admission: Dg Chest 2 View  Result Date: 02/14/2016 CLINICAL DATA:  Fevers EXAM: CHEST  2 VIEW COMPARISON:  02/11/2016 FINDINGS: Cardiac shadow is mildly enlarged but stable from the previous exam. The lungs are well aerated bilaterally. No focal infiltrate or sizable effusion is  seen. No bony abnormality is noted. IMPRESSION: No active cardiopulmonary disease. Electronically Signed   By: Inez Catalina M.D.   On: 02/14/2016 23:28  Mr Lumbar Spine Wo Contrast  Result Date: 02/14/2016 CLINICAL DATA:  Initial evaluation for acute back pain. EXAM: MRI LUMBAR SPINE WITHOUT CONTRAST TECHNIQUE: Multiplanar, multisequence MR imaging of the lumbar spine was performed. No intravenous contrast was administered. COMPARISON:  Prior MRI from 12/10/2008. FINDINGS: Segmentation: Normal segmentation. Lowest well-formed disc is presumed to be the L5-S1 level. Alignment: Trace anterolisthesis of L3 on L4, L4 on L5, and L5 on S1, stable. Vertebral bodies otherwise normally aligned with preservation of the normal lumbar lordosis. Vertebrae: Vertebral body heights maintained. No acute or chronic fracture. Signal intensity within the vertebral body bone marrow somewhat heterogeneous without worrisome osseous lesion. Few scattered benign hemangiomas noted. No marrow edema. Conus medullaris: Extends to the L1 level and appears normal. Paraspinal and other soft tissues: Paraspinous soft tissues demonstrate no acute abnormality. Mild fatty atrophy noted within the paraspinous musculature. Disc levels: T11-12: Seen only on sagittal projection. Disc desiccation with mild diffuse disc bulge. No stenosis. T12-L1: Seen only on sagittal projection. Disc desiccation with mild diffuse disc bulge without focal disc herniation. No significant stenosis. L1-2: Mild diffuse disc bulge with disc desiccation and intervertebral disc space narrowing. Mild bile facet arthrosis with ligamentum flavum hypertrophy. Mild canal stenosis is stable from prior. No significant foraminal encroachment. L2-3: Mild diffuse disc bulge with disc desiccation intervertebral disc space narrowing, slightly progressed from previous. Bilateral facet arthrosis ligamentum flavum hypertrophy, also progressed. Resultant mild canal stenosis is slightly  worsened. No significant foraminal encroachment. L3-4: Trace anterolisthesis of L3 and L4. Resultant mild diffuse disc bulge with disc desiccation. Bilateral facet arthrosis with ligamentum flavum hypertrophy, slightly progressed. Resultant mild canal and lateral recess stenosis, slightly progressed. Mild bilateral foraminal stenosis out frank nerve root impingement. L4-5: Trace anterolisthesis of L4 on L5. Broad-based shallow central disc protrusion indents upon the ventral thecal sac. Progressive facet and ligamentum flavum hypertrophy. Slightly worsened lateral recess stenosis with mild to moderate canal narrowing. Mild bilateral foraminal stenosis, left greater than right is similar.  L5-S1: Trace anterolisthesis of L5 on S1. Shallow central disc protrusion indents upon the ventral thecal sac without significant stenosis. No neural impingement. Moderate bilateral facet arthrosis, similar to previous. Foramina remain widely patent. IMPRESSION: 1. No acute abnormality within the lumbar spine. 2. Broad base disc protrusion at L4-5 with superimposed facet disease, resulting in mild to moderate canal and lateral recess stenosis, mildly worsened from previous. 3. Additional more mild multilevel degenerative spondylolysis and facet disease as detailed above, overall slightly progressed relative to most recent MRI from 12/10/2008. No high-grade spinal stenosis or evidence of nerve root compression. Electronically Signed   By: Jeannine Boga M.D.   On: 02/14/2016 22:34  CT abdomen and pelvis w contrast Study Result   CLINICAL DATA:  82 year old female with elevated altered process. EXAM: CT ABDOMEN AND PELVIS WITH CONTRAST TECHNIQUE: Multidetector CT imaging of the abdomen and pelvis was performed using the standard protocol following bolus administration of intravenous contrast. CONTRAST:  166mL ISOVUE-300 IOPAMIDOL (ISOVUE-300) INJECTION 61% COMPARISON:  Abdominal radiograph dated 02/11/2016 and  ultrasound dated 02/14/2009 FINDINGS: The visualized lung bases are clear. Calcified mitral annulus. There is coronary vascular calcification. There is dilatation of the right atrium. No intra-abdominal free air.  Small free fluid within the pelvis. Mild hepatomegaly the gallbladder is distended. No calcified stone or pericholecystic fluid. The pancreas, spleen, adrenal glands, kidneys, visualized ureters, and urinary bladder appear unremarkable. The uterus is retroverted and grossly unremarkable. Evaluation of the pelvic structures is limited due to streak artifact caused by a right hip arthroplasty. There is extensive sigmoid diverticulosis with muscular hypertrophy. No definite active inflammatory changes. There is moderate stool throughout the colon. No evidence of bowel obstruction or active inflammation. Normal appendix. There is moderate aortoiliac atherosclerotic disease. The IVC appears unremarkable. The origins of the celiac axis, SMA, IMA as well as the origins of the renal arteries are patent. No portal venous gas identified. There is no adenopathy. The abdominal wall soft tissues appear unremarkable. There is osteopenia with degenerative changes of the spine. There is osteoarthritic changes of the left hip. Total right hip arthroplasty. No acute fracture. IMPRESSION: Sigmoid diverticulosis without definite evidence acute diverticulitis. No evidence of bowel obstruction. Small free fluid within the pelvis of indeterminate etiology. Clinical correlation is recommended. Distended gallbladder. No calcified stone or pericholecystic fluid. Ultrasound may provide better evaluation of the gallbladder. Electronically Signed   By: Anner Crete M.D.   On: 02/15/2016 01:14     Echocardiogram 01/30/2016 ------------------------------------------------------------------- Indications:      Abnormal EKG  794.31.  ------------------------------------------------------------------- History:   PMH:  Bradycardia. GERD.  Risk factors:  Hypertension. Dyslipidemia.  ------------------------------------------------------------------- Study Conclusions  - Left ventricle: The cavity size was normal. There was moderate   concentric hypertrophy. Systolic function was vigorous. The   estimated ejection fraction was in the range of 65% to 70%. Wall   motion was normal; there were no regional wall motion   abnormalities. Doppler parameters are consistent with abnormal   left ventricular relaxation (grade 1 diastolic dysfunction).   Doppler parameters are consistent with elevated ventricular   end-diastolic filling pressure. - Aortic valve: Trileaflet; severely thickened, severely calcified   leaflets. Valve mobility was restricted. There was mild stenosis.   Mean gradient (S): 12 mm Hg. Peak gradient (S): 21 mm Hg. Valve   area (VTI): 1.98 cm^2. Valve area (Vmax): 2.03 cm^2. Valve area   (Vmean): 1.91 cm^2. - Aortic root: The aortic root was normal in size. - Ascending aorta: The ascending  aorta was normal in size. - Mitral valve: Calcified annulus. Mildly thickened leaflets .   There was mild regurgitation. Valve area by continuity equation   (using LVOT flow): 2.21 cm^2. - Left atrium: The atrium was mildly dilated. - Right ventricle: Systolic function was normal. - Right atrium: The atrium was moderately dilated. - Tricuspid valve: There was moderate regurgitation. - Pulmonic valve: There was trivial regurgitation. - Pulmonary arteries: Systolic pressure was mildly increased. PA   peak pressure: 41 mm Hg (S). - Inferior vena cava: The vessel was normal in size. - Pericardium, extracardiac: There was no pericardial effusion.  EKG: Independently reviewed. Vent. rate 59 BPM PR interval * ms QRS duration 88 ms QT/QTc 423/419 ms P-R-T axes 70 5 39 Sinus rhythm Probable left atrial  enlargement No significant change since last tracing  Assessment/Plan Principal problem:   Fever Admit to telemetry/observation. Defer IV antibiotics for now, since there is no obvious source for the fever. Follow-up temperature measurements. Follow-up WBC. Follow-up blood cultures and sensitivity. Check ultrasound of the gallbladder in the morning.  Active Problems:   Hypertension Continue amlodipine 5 mg by mouth daily. Follow-up blood pressure.    Hyperlipidemia Continue simvastatin 20 mg by mouth daily. Monitor LFTs periodically.    GERD (gastroesophageal reflux disease) Continue omeprazole 20 mg by mouth daily. Pantoprazole 40 mg by mouth daily while in the hospital.    Spinal stenosis Continue tramadol as needed for pain.    Depression Continue sertraline 75 mg by mouth daily    Anxiety . Sertraline 75 mg by mouth daily. Continue clonazepam 0.5 mg by mouth when necessary for anxiety.    Anemia Monitor H&H.    Hyponatremia Continue gentle IV fluids.    Constipation Continue MiraLAX.    DVT prophylaxis: Lovenox. Code Status: Full code. Family Communication:  Disposition Plan: Admit for further evaluation and treatment. Consults called:  Admission status: Observation telemetry.   Reubin Milan MD [facility] Pager [phone].  If 7PM-7AM, please contact night-coverage [email or web address] Password [facility]  02/15/2016, 12:25 AM

## 2016-02-15 NOTE — Progress Notes (Signed)
Initial Nutrition Assessment  DOCUMENTATION CODES:   Not applicable  INTERVENTION:  Once diet advances, provide Ensure Enlive po BID, each supplement provides 350 kcal and 20 grams of protein.  NUTRITION DIAGNOSIS:   Inadequate oral intake related to poor appetite as evidenced by per patient/family report.  GOAL:   Patient will meet greater than or equal to 90% of their needs  MONITOR:   Diet advancement, Supplement acceptance, Labs, Weight trends, Skin, I & O's  REASON FOR ASSESSMENT:   Malnutrition Screening Tool    ASSESSMENT:   82 y.o. female known to me from her recent admission due to symptomatic bradycardia who has a medical history significant of seasonal allergies, osteoarthritis, depression, endometriosis, GERD, hearing loss, hyperlipidemia, hypertension, osteoporosis, spinal stenosis who presents with fever, weakness and orthostatic low blood pressure.   Pt is currently NPO for procedure. Pt unavailable, speaking with MD, during time of visit. RD to revisit to obtain nutrition history. RD to order Ensure to aid in caloric and protein needs as pt did report having a lack of appetite. Nursing staff to provide once pt is able to take POs. Per Epic weight records, weight has been stable.   Unable to complete Nutrition-Focused physical exam at this time.   Labs and medications reviewed.   Diet Order:    NPO  Skin:  Reviewed, no issues  Last BM:  7/28  Height:   Ht Readings from Last 1 Encounters:  02/15/16 5\' 3"  (1.6 m)    Weight:   Wt Readings from Last 1 Encounters:  02/15/16 140 lb (63.5 kg)    Ideal Body Weight:  52.27 kg  BMI:  Body mass index is 24.8 kg/m.  Estimated Nutritional Needs:   Kcal:  1400-1600  Protein:  60-70 grams  Fluid:  >/= 1.5 L/day  EDUCATION NEEDS:   No education needs identified at this time  Corrin Parker, MS, RD, LDN Pager # [phone] After hours/ weekend pager # [phone]

## 2016-02-15 NOTE — ED Notes (Signed)
Patient transported to CT. [name]

## 2016-02-15 NOTE — Care Management Obs Status (Signed)
New Site NOTIFICATION   Patient Details  Name: [name] MRN: [id number] Date of Birth: [birth year]   Medicare Observation Status Notification Given:  Yes    Gillie Fleites, Rory Percy, RN 02/15/2016, 2:13 PM

## 2016-02-15 NOTE — Progress Notes (Signed)
Pts husband called from home and would like physician to call him at [phone] to update him on her progress. He is unable to come in due to being in Home care. Paged Physician at 2.30pm to notify of this.

## 2016-02-15 NOTE — Progress Notes (Signed)
[facility] PROGRESS NOTE  [name] QMV:[id number] DOB: 1923-01-25 DOA: 02/14/2016 PCP: [name], MD  Assessment/Plan: 82 y/o female with PMH of HTN, DJD, Spinal Stenosis, presented from PCP office for evaluation for low grade fever, generalized weakness  Low grade fever at home. Patient remains afebrile, no significant leukocytosis, no s/s of infection. CXR/CT abd: no acute findings. UA: unremarkable, pend blood cultures. RUQ [place]: distended gallbladder.+elevated alk phos, Will obtain HIDA -also check ESR. ? PMR  Hypertension. Continue amlodipine 5 mg by mouth daily. Follow-up blood pressure.  GERD. Cont PPI Spinal stenosis. Continue tramadol as needed for pain. Neuro exam is non focal  Depression. Anxiety. Sertraline 75 mg by mouth daily.. Continue clonazepam 0.5 mg by mouth when necessary for anxiety. Hyponatremia likely dehydration. Cont gentle IV fluids.  Code Status: full Family Communication: d/w patient, RN (indicate person spoken with, relationship, and if by phone, the number) Disposition Plan: home 24-48 hrs    Consultants:  none  Procedures:  Pend RUQ [place]  Antibiotics:  none (indicate start date, and stop date if known)  HPI/Subjective: Alert, no distress   Objective: Vitals:   02/15/16 0145 02/15/16 0515  BP: (!) 157/66 (!) 147/74  Pulse: 72 75  Resp: 18 15  Temp: 99.1 F (37.3 C) 99.1 F (37.3 C)   No intake or output data in the 24 hours ending 02/15/16 1021 Filed Weights   02/15/16 0145  Weight: 63.5 kg (140 lb)    Exam:   General:  comfortable   Cardiovascular: s1,s2 rrr  Respiratory: CTA BL  Abdomen: soft, nt,nd   Musculoskeletal: no leg edema   Data Reviewed: Basic Metabolic Panel:  Recent Labs Lab 02/11/16 0955 02/14/16 1807 02/15/16 0101  NA 132* 128* 132*  K 3.8 3.9 3.9  CL 93* 91* 96*  CO2 30 27 24   GLUCOSE 110* 114* 107*  BUN 13 15 11   CREATININE 0.69 0.72 0.62  CALCIUM 9.2 8.6* 8.9   Liver  Function Tests:  Recent Labs Lab 02/11/16 0955 02/14/16 1807 02/15/16 0101  AST 18 33 29  ALT 26 35 34  ALKPHOS 239* 370* 370*  BILITOT 0.7 0.5 0.5  PROT 7.1 6.9 7.3  ALBUMIN 3.0* 2.7* 2.9*    Recent Labs Lab 02/14/16 1807  LIPASE 13   No results for input(s): AMMONIA in the last 168 hours. CBC:  Recent Labs Lab 02/11/16 0955 02/14/16 1807 02/15/16 0101  WBC 10.5 11.8* 11.1*  NEUTROABS 8.5* 9.4* 8.3*  HGB 11.7* 10.3* 11.8*  HCT 35.7* 31.1* 36.1  MCV 93.9 91.7 93.5  PLT 335 400 420*   Cardiac Enzymes: No results for input(s): CKTOTAL, CKMB, CKMBINDEX, TROPONINI in the last 168 hours. BNP (last 3 results) No results for input(s): BNP in the last 8760 hours.  ProBNP (last 3 results) No results for input(s): PROBNP in the last 8760 hours.  CBG:  Recent Labs Lab 02/15/16 0133  GLUCAP 153*    No results found for this or any previous visit (from the past 240 hour(s)).   Studies: Dg Chest 2 View  Result Date: 02/14/2016 CLINICAL DATA:  Fevers EXAM: CHEST  2 VIEW COMPARISON:  02/11/2016 FINDINGS: Cardiac shadow is mildly enlarged but stable from the previous exam. The lungs are well aerated bilaterally. No focal infiltrate or sizable effusion is seen. No bony abnormality is noted. IMPRESSION: No active cardiopulmonary disease. Electronically Signed   By: Inez Catalina M.D.   On: 02/14/2016 23:28  Mr Lumbar Spine Wo Contrast  Result  Date: 02/14/2016 CLINICAL DATA:  Initial evaluation for acute back pain. EXAM: MRI LUMBAR SPINE WITHOUT CONTRAST TECHNIQUE: Multiplanar, multisequence MR imaging of the lumbar spine was performed. No intravenous contrast was administered. COMPARISON:  Prior MRI from 12/10/2008. FINDINGS: Segmentation: Normal segmentation. Lowest well-formed disc is presumed to be the L5-S1 level. Alignment: Trace anterolisthesis of L3 on L4, L4 on L5, and L5 on S1, stable. Vertebral bodies otherwise normally aligned with preservation of the normal lumbar  lordosis. Vertebrae: Vertebral body heights maintained. No acute or chronic fracture. Signal intensity within the vertebral body bone marrow somewhat heterogeneous without worrisome osseous lesion. Few scattered benign hemangiomas noted. No marrow edema. Conus medullaris: Extends to the L1 level and appears normal. Paraspinal and other soft tissues: Paraspinous soft tissues demonstrate no acute abnormality. Mild fatty atrophy noted within the paraspinous musculature. Disc levels: T11-12: Seen only on sagittal projection. Disc desiccation with mild diffuse disc bulge. No stenosis. T12-L1: Seen only on sagittal projection. Disc desiccation with mild diffuse disc bulge without focal disc herniation. No significant stenosis. L1-2: Mild diffuse disc bulge with disc desiccation and intervertebral disc space narrowing. Mild bile facet arthrosis with ligamentum flavum hypertrophy. Mild canal stenosis is stable from prior. No significant foraminal encroachment. L2-3: Mild diffuse disc bulge with disc desiccation intervertebral disc space narrowing, slightly progressed from previous. Bilateral facet arthrosis ligamentum flavum hypertrophy, also progressed. Resultant mild canal stenosis is slightly worsened. No significant foraminal encroachment. L3-4: Trace anterolisthesis of L3 and L4. Resultant mild diffuse disc bulge with disc desiccation. Bilateral facet arthrosis with ligamentum flavum hypertrophy, slightly progressed. Resultant mild canal and lateral recess stenosis, slightly progressed. Mild bilateral foraminal stenosis out frank nerve root impingement. L4-5: Trace anterolisthesis of L4 on L5. Broad-based shallow central disc protrusion indents upon the ventral thecal sac. Progressive facet and ligamentum flavum hypertrophy. Slightly worsened lateral recess stenosis with mild to moderate canal narrowing. Mild bilateral foraminal stenosis, left greater than right is similar. L5-S1: Trace anterolisthesis of L5 on S1.  Shallow central disc protrusion indents upon the ventral thecal sac without significant stenosis. No neural impingement. Moderate bilateral facet arthrosis, similar to previous. Foramina remain widely patent. IMPRESSION: 1. No acute abnormality within the lumbar spine. 2. Broad base disc protrusion at L4-5 with superimposed facet disease, resulting in mild to moderate canal and lateral recess stenosis, mildly worsened from previous. 3. Additional more mild multilevel degenerative spondylolysis and facet disease as detailed above, overall slightly progressed relative to most recent MRI from 12/10/2008. No high-grade spinal stenosis or evidence of nerve root compression. Electronically Signed   By: Jeannine Boga M.D.   On: 02/14/2016 22:34  Ct Abdomen Pelvis W Contrast  Result Date: 02/15/2016 CLINICAL DATA:  82 year old female with elevated altered process. EXAM: CT ABDOMEN AND PELVIS WITH CONTRAST TECHNIQUE: Multidetector CT imaging of the abdomen and pelvis was performed using the standard protocol following bolus administration of intravenous contrast. CONTRAST:  147m ISOVUE-300 IOPAMIDOL (ISOVUE-300) INJECTION 61% COMPARISON:  Abdominal radiograph dated 02/11/2016 and ultrasound dated 02/14/2009 FINDINGS: The visualized lung bases are clear. Calcified mitral annulus. There is coronary vascular calcification. There is dilatation of the right atrium. No intra-abdominal free air.  Small free fluid within the pelvis. Mild hepatomegaly the gallbladder is distended. No calcified stone or pericholecystic fluid. The pancreas, spleen, adrenal glands, kidneys, visualized ureters, and urinary bladder appear unremarkable. The uterus is retroverted and grossly unremarkable. Evaluation of the pelvic structures is limited due to streak artifact caused by a right hip arthroplasty. There is extensive  sigmoid diverticulosis with muscular hypertrophy. No definite active inflammatory changes. There is moderate stool  throughout the colon. No evidence of bowel obstruction or active inflammation. Normal appendix. There is moderate aortoiliac atherosclerotic disease. The IVC appears unremarkable. The origins of the celiac axis, SMA, IMA as well as the origins of the renal arteries are patent. No portal venous gas identified. There is no adenopathy. The abdominal wall soft tissues appear unremarkable. There is osteopenia with degenerative changes of the spine. There is osteoarthritic changes of the left hip. Total right hip arthroplasty. No acute fracture. IMPRESSION: Sigmoid diverticulosis without definite evidence acute diverticulitis. No evidence of bowel obstruction. Small free fluid within the pelvis of indeterminate etiology. Clinical correlation is recommended. Distended gallbladder. No calcified stone or pericholecystic fluid. Ultrasound may provide better evaluation of the gallbladder. Electronically Signed   By: Anner Crete M.D.   On: 02/15/2016 01:14  US Abdomen Limited Ruq  Result Date: 02/15/2016 CLINICAL DATA:  Fever and back pain. EXAM: US ABDOMEN LIMITED - RIGHT UPPER QUADRANT COMPARISON:  Abdominal CT 02/15/2016 FINDINGS: Gallbladder: Mild distention of the gallbladder which is similar to the recent CT. Gallbladder measures 9.8 cm and length. No gallbladder wall thickening and no evidence for gallstones or sludge. Patient does not have a sonographic Murphy's sign. No evidence for pericholecystic fluid. Common bile duct: Diameter: 5 mm. Liver: No focal lesion identified. Within normal limits in parenchymal echogenicity. IMPRESSION: Mild gallbladder distention without inflammatory changes and no evidence for stones. Electronically Signed   By: Markus Daft M.D.   On: 02/15/2016 09:30   Scheduled Meds: . aspirin EC  81 mg Oral Daily  . cholecalciferol  2,000 Units Oral Daily  . enoxaparin (LOVENOX) injection  40 mg Subcutaneous Q24H  . ipratropium-albuterol  3 mL Nebulization BID  . multivitamin with  minerals  1 tablet Oral Daily  . pantoprazole  40 mg Oral Daily  . polyethylene glycol  17 g Oral BID  . sertraline  75 mg Oral Daily  . sodium chloride flush  3 mL Intravenous Q12H  . traMADol  50 mg Oral BID   Continuous Infusions: . sodium chloride 75 mL/hr at 02/15/16 7416    Principal Problem:   Fever Active Problems:   Hypertension   Hyperlipidemia   GERD (gastroesophageal reflux disease)   Spinal stenosis   Constipation   Depression   Anxiety   Anemia   Hyponatremia    Time spent: >35 minutes     Kinnie Feil  [facility] Pager [phone]. If 7PM-7AM, please contact night-coverage at [email or web address], password [facility] 02/15/2016, 10:21 AM  LOS: 0 days

## 2016-02-16 DIAGNOSIS — M353 Polymyalgia rheumatica: Secondary | ICD-10-CM

## 2016-02-16 DIAGNOSIS — F329 Major depressive disorder, single episode, unspecified: Secondary | ICD-10-CM | POA: Diagnosis not present

## 2016-02-16 DIAGNOSIS — R509 Fever, unspecified: Secondary | ICD-10-CM | POA: Diagnosis not present

## 2016-02-16 LAB — CBC WITH DIFFERENTIAL/PLATELET
Basophils Absolute: 0 10*3/uL (ref 0.0–0.1)
Basophils Relative: 0 %
EOS PCT: 1 %
Eosinophils Absolute: 0.1 10*3/uL (ref 0.0–0.7)
HCT: 31.9 % — ABNORMAL LOW (ref 36.0–46.0)
Hemoglobin: 10.1 g/dL — ABNORMAL LOW (ref 12.0–15.0)
LYMPHS ABS: 0.8 10*3/uL (ref 0.7–4.0)
LYMPHS PCT: 8 %
MCH: 29.7 pg (ref 26.0–34.0)
MCHC: 31.7 g/dL (ref 30.0–36.0)
MCV: 93.8 fL (ref 78.0–100.0)
MONO ABS: 0.8 10*3/uL (ref 0.1–1.0)
MONOS PCT: 8 %
Neutro Abs: 8.6 10*3/uL — ABNORMAL HIGH (ref 1.7–7.7)
Neutrophils Relative %: 83 %
PLATELETS: 456 10*3/uL — AB (ref 150–400)
RBC: 3.4 MIL/uL — AB (ref 3.87–5.11)
RDW: 11.6 % (ref 11.5–15.5)
WBC: 10.2 10*3/uL (ref 4.0–10.5)

## 2016-02-16 LAB — COMPREHENSIVE METABOLIC PANEL
ALT: 29 U/L (ref 14–54)
ANION GAP: 8 (ref 5–15)
AST: 27 U/L (ref 15–41)
Albumin: 2.3 g/dL — ABNORMAL LOW (ref 3.5–5.0)
Alkaline Phosphatase: 300 U/L — ABNORMAL HIGH (ref 38–126)
BUN: 16 mg/dL (ref 6–20)
CHLORIDE: 96 mmol/L — AB (ref 101–111)
CO2: 28 mmol/L (ref 22–32)
CREATININE: 0.63 mg/dL (ref 0.44–1.00)
Calcium: 8.7 mg/dL — ABNORMAL LOW (ref 8.9–10.3)
Glucose, Bld: 116 mg/dL — ABNORMAL HIGH (ref 65–99)
Potassium: 4 mmol/L (ref 3.5–5.1)
Sodium: 132 mmol/L — ABNORMAL LOW (ref 135–145)
Total Bilirubin: 0.4 mg/dL (ref 0.3–1.2)
Total Protein: 6.3 g/dL — ABNORMAL LOW (ref 6.5–8.1)

## 2016-02-16 NOTE — Progress Notes (Addendum)
Pt states, "I am seeing hallucinations of little girls dancing when my eyes are closed. They are gone when I open my eyes again. Could this be from the steroid I'm taking?"  Vss. Lamar Blinks, NP notified. Will continue to monitor.

## 2016-02-16 NOTE — Progress Notes (Addendum)
[facility] PROGRESS NOTE  [name] [id number] DOB: July 30, 1922 DOA: 02/14/2016 PCP: [name], MD  Assessment/Plan: 82 y/o female with PMH of HTN, DJD, Spinal Stenosis, presented from PCP office for evaluation for low grade fevers, fatigue, upper extremity bilateral proximal weakness, myalgias, neck pains, generalized weakness. Found to have PMR, started on steroid treatment   PMR. Low grade fevers, proximal muscle pains, fatigue. No s/s of temporal arteritis, no headaches, no vision changes.   -Patient remains afebrile, no significant leukocytosis, no s/s of infection. CXR/CT abd: no acute findings. UA: unremarkable, pend blood cultures. RUQ [place]: distended gallbladder but negative HIDA -started on low dose steroid at 20 mg daily, with some improvement in 24 hrs. Cont steroids, recommended to f/u with PCP or rheumatology to down titrate as continuous improvement in 1 week, requires prolonged treatment   Hypertension. Continue amlodipine 5 mg by mouth daily. Follow-up blood pressure.  GERD. Cont PPI Spinal stenosis. Continue tramadol as needed for pain. Neuro exam is non focal  Depression. Anxiety. Sertraline 75 mg by mouth daily.. Continue clonazepam 0.5 mg by mouth when necessary for anxiety. Hyponatremia likely dehydration. Cont gentle IV fluids.  Disposition 24-48 hrs: pend blood cultures. Obtain PT/OT eval.   Code Status: full Family Communication: d/w patient, RN. Recently updated her husband, daughter (indicate person spoken with, relationship, and if by phone, the number) Disposition Plan: home 24-48 hrs. Obtain PT    Consultants:  none  Procedures:  RUQ [place]  HIDA  Antibiotics:  none (indicate start date, and stop date if known)  HPI/Subjective: She reports somewhat better with steroid. But still reports generalized weakness, fatigue, requested overnight monitoring. Alert, no distress   Objective: Vitals:   02/15/16 2146 02/15/16 2244  BP: 132/65    Pulse: 63   Resp: 16   Temp: 98.2 F (36.8 C) 98.5 F (36.9 C)    Intake/Output Summary (Last 24 hours) at 02/16/16 0934 Last data filed at 02/16/16 0555  Gross per 24 hour  Intake              240 ml  Output                0 ml  Net              240 ml   Filed Weights   02/15/16 0145  Weight: 63.5 kg (140 lb)    Exam:   General:  comfortable   Cardiovascular: s1,s2 rrr  Respiratory: CTA BL  Abdomen: soft, nt,nd   Musculoskeletal: no leg edema   Data Reviewed: Basic Metabolic Panel:  Recent Labs Lab 02/11/16 0955 02/14/16 1807 02/15/16 0101 02/16/16 0512  NA 132* 128* 132* 132*  K 3.8 3.9 3.9 4.0  CL 93* 91* 96* 96*  CO2 30 27 24 28   GLUCOSE 110* 114* 107* 116*  BUN 13 15 11 16   CREATININE 0.69 0.72 0.62 0.63  CALCIUM 9.2 8.6* 8.9 8.7*   Liver Function Tests:  Recent Labs Lab 02/11/16 0955 02/14/16 1807 02/15/16 0101 02/16/16 0512  AST 18 33 29 27  ALT 26 35 34 29  ALKPHOS 239* 370* 370* 300*  BILITOT 0.7 0.5 0.5 0.4  PROT 7.1 6.9 7.3 6.3*  ALBUMIN 3.0* 2.7* 2.9* 2.3*    Recent Labs Lab 02/14/16 1807  LIPASE 13   No results for input(s): AMMONIA in the last 168 hours. CBC:  Recent Labs Lab 02/11/16 0955 02/14/16 1807 02/15/16 0101 02/16/16 0512  WBC 10.5 11.8* 11.1* 10.2  NEUTROABS 8.5* 9.4* 8.3* 8.6*  HGB 11.7* 10.3* 11.8* 10.1*  HCT 35.7* 31.1* 36.1 31.9*  MCV 93.9 91.7 93.5 93.8  PLT 335 400 420* 456*   Cardiac Enzymes: No results for input(s): CKTOTAL, CKMB, CKMBINDEX, TROPONINI in the last 168 hours. BNP (last 3 results) No results for input(s): BNP in the last 8760 hours.  ProBNP (last 3 results) No results for input(s): PROBNP in the last 8760 hours.  CBG:  Recent Labs Lab 02/15/16 0133  GLUCAP 153*    Recent Results (from the past 240 hour(s))  Urine culture     Status: None   Collection Time: 02/14/16  6:44 PM  Result Value Ref Range Status   Specimen Description URINE, CATHETERIZED  Final   Special  Requests NONE  Final   Culture NO GROWTH  Final   Report Status 02/15/2016 FINAL  Final     Studies: Dg Chest 2 View  Result Date: 02/14/2016 CLINICAL DATA:  Fevers EXAM: CHEST  2 VIEW COMPARISON:  02/11/2016 FINDINGS: Cardiac shadow is mildly enlarged but stable from the previous exam. The lungs are well aerated bilaterally. No focal infiltrate or sizable effusion is seen. No bony abnormality is noted. IMPRESSION: No active cardiopulmonary disease. Electronically Signed   By: Inez Catalina M.D.   On: 02/14/2016 23:28  Mr Lumbar Spine Wo Contrast  Result Date: 02/14/2016 CLINICAL DATA:  Initial evaluation for acute back pain. EXAM: MRI LUMBAR SPINE WITHOUT CONTRAST TECHNIQUE: Multiplanar, multisequence MR imaging of the lumbar spine was performed. No intravenous contrast was administered. COMPARISON:  Prior MRI from 12/10/2008. FINDINGS: Segmentation: Normal segmentation. Lowest well-formed disc is presumed to be the L5-S1 level. Alignment: Trace anterolisthesis of L3 on L4, L4 on L5, and L5 on S1, stable. Vertebral bodies otherwise normally aligned with preservation of the normal lumbar lordosis. Vertebrae: Vertebral body heights maintained. No acute or chronic fracture. Signal intensity within the vertebral body bone marrow somewhat heterogeneous without worrisome osseous lesion. Few scattered benign hemangiomas noted. No marrow edema. Conus medullaris: Extends to the L1 level and appears normal. Paraspinal and other soft tissues: Paraspinous soft tissues demonstrate no acute abnormality. Mild fatty atrophy noted within the paraspinous musculature. Disc levels: T11-12: Seen only on sagittal projection. Disc desiccation with mild diffuse disc bulge. No stenosis. T12-L1: Seen only on sagittal projection. Disc desiccation with mild diffuse disc bulge without focal disc herniation. No significant stenosis. L1-2: Mild diffuse disc bulge with disc desiccation and intervertebral disc space narrowing. Mild bile  facet arthrosis with ligamentum flavum hypertrophy. Mild canal stenosis is stable from prior. No significant foraminal encroachment. L2-3: Mild diffuse disc bulge with disc desiccation intervertebral disc space narrowing, slightly progressed from previous. Bilateral facet arthrosis ligamentum flavum hypertrophy, also progressed. Resultant mild canal stenosis is slightly worsened. No significant foraminal encroachment. L3-4: Trace anterolisthesis of L3 and L4. Resultant mild diffuse disc bulge with disc desiccation. Bilateral facet arthrosis with ligamentum flavum hypertrophy, slightly progressed. Resultant mild canal and lateral recess stenosis, slightly progressed. Mild bilateral foraminal stenosis out frank nerve root impingement. L4-5: Trace anterolisthesis of L4 on L5. Broad-based shallow central disc protrusion indents upon the ventral thecal sac. Progressive facet and ligamentum flavum hypertrophy. Slightly worsened lateral recess stenosis with mild to moderate canal narrowing. Mild bilateral foraminal stenosis, left greater than right is similar. L5-S1: Trace anterolisthesis of L5 on S1. Shallow central disc protrusion indents upon the ventral thecal sac without significant stenosis. No neural impingement. Moderate bilateral facet arthrosis, similar to previous. Foramina remain widely  patent. IMPRESSION: 1. No acute abnormality within the lumbar spine. 2. Broad base disc protrusion at L4-5 with superimposed facet disease, resulting in mild to moderate canal and lateral recess stenosis, mildly worsened from previous. 3. Additional more mild multilevel degenerative spondylolysis and facet disease as detailed above, overall slightly progressed relative to most recent MRI from 12/10/2008. No high-grade spinal stenosis or evidence of nerve root compression. Electronically Signed   By: Jeannine Boga M.D.   On: 02/14/2016 22:34  Ct Abdomen Pelvis W Contrast  Result Date: 02/15/2016 CLINICAL DATA:   82 year old female with elevated altered process. EXAM: CT ABDOMEN AND PELVIS WITH CONTRAST TECHNIQUE: Multidetector CT imaging of the abdomen and pelvis was performed using the standard protocol following bolus administration of intravenous contrast. CONTRAST:  166mL ISOVUE-300 IOPAMIDOL (ISOVUE-300) INJECTION 61% COMPARISON:  Abdominal radiograph dated 02/11/2016 and ultrasound dated 02/14/2009 FINDINGS: The visualized lung bases are clear. Calcified mitral annulus. There is coronary vascular calcification. There is dilatation of the right atrium. No intra-abdominal free air.  Small free fluid within the pelvis. Mild hepatomegaly the gallbladder is distended. No calcified stone or pericholecystic fluid. The pancreas, spleen, adrenal glands, kidneys, visualized ureters, and urinary bladder appear unremarkable. The uterus is retroverted and grossly unremarkable. Evaluation of the pelvic structures is limited due to streak artifact caused by a right hip arthroplasty. There is extensive sigmoid diverticulosis with muscular hypertrophy. No definite active inflammatory changes. There is moderate stool throughout the colon. No evidence of bowel obstruction or active inflammation. Normal appendix. There is moderate aortoiliac atherosclerotic disease. The IVC appears unremarkable. The origins of the celiac axis, SMA, IMA as well as the origins of the renal arteries are patent. No portal venous gas identified. There is no adenopathy. The abdominal wall soft tissues appear unremarkable. There is osteopenia with degenerative changes of the spine. There is osteoarthritic changes of the left hip. Total right hip arthroplasty. No acute fracture. IMPRESSION: Sigmoid diverticulosis without definite evidence acute diverticulitis. No evidence of bowel obstruction. Small free fluid within the pelvis of indeterminate etiology. Clinical correlation is recommended. Distended gallbladder. No calcified stone or pericholecystic fluid.  Ultrasound may provide better evaluation of the gallbladder. Electronically Signed   By: Anner Crete M.D.   On: 02/15/2016 01:14  Nm Hepato W/eject Fract  Result Date: 02/15/2016 CLINICAL DATA:  Abdominal pain and fever for 3 weeks EXAM: NUCLEAR MEDICINE HEPATOBILIARY IMAGING WITH GALLBLADDER EF Views: Anterior right upper quadrant RADIOPHARMACEUTICALS:  5.01 mCi Tc-18m  Choletec IV COMPARISON:  None. FINDINGS: Liver uptake of radiotracer is normal. There is prompt visualization of gallbladder and small bowel, consistent with patency of the cystic and common bile ducts. The patient consumed 8 ounces of Ensure orally with calculation of the computer generated ejection fraction of radiotracer from the gallbladder. The patient did not experience clinical symptoms with the oral Ensure consumption. The computer generated ejection fraction of radiotracer from the gallbladder is normal at 100%, normal greater than 33% using the oral agent. IMPRESSION: Study within normal limits. Electronically Signed   By: Lowella Grip III M.D.   On: 02/15/2016 16:44  US Abdomen Limited Ruq  Result Date: 02/15/2016 CLINICAL DATA:  Fever and back pain. EXAM: US ABDOMEN LIMITED - RIGHT UPPER QUADRANT COMPARISON:  Abdominal CT 02/15/2016 FINDINGS: Gallbladder: Mild distention of the gallbladder which is similar to the recent CT. Gallbladder measures 9.8 cm and length. No gallbladder wall thickening and no evidence for gallstones or sludge. Patient does not have a sonographic Murphy's sign. No evidence  for pericholecystic fluid. Common bile duct: Diameter: 5 mm. Liver: No focal lesion identified. Within normal limits in parenchymal echogenicity. IMPRESSION: Mild gallbladder distention without inflammatory changes and no evidence for stones. Electronically Signed   By: Markus Daft M.D.   On: 02/15/2016 09:30   Scheduled Meds: . aspirin EC  81 mg Oral Daily  . cholecalciferol  2,000 Units Oral Daily  . enoxaparin (LOVENOX)  injection  40 mg Subcutaneous Q24H  . feeding supplement (ENSURE ENLIVE)  237 mL Oral BID BM  . ipratropium-albuterol  3 mL Nebulization BID  . multivitamin with minerals  1 tablet Oral Daily  . pantoprazole  40 mg Oral Daily  . polyethylene glycol  17 g Oral BID  . predniSONE  20 mg Oral Q breakfast  . sertraline  75 mg Oral Daily  . sodium chloride flush  3 mL Intravenous Q12H  . traMADol  50 mg Oral BID   Continuous Infusions:    Principal Problem:   Fever, unspecified Active Problems:   Hypertension   Hyperlipidemia   GERD (gastroesophageal reflux disease)   Spinal stenosis   Constipation   Depression   Anxiety   Anemia   Hyponatremia    Time spent: >35 minutes     Kinnie Feil  [facility] Pager [phone]. If 7PM-7AM, please contact night-coverage at [email or web address], password [name] 02/16/2016, 9:34 AM  LOS: 0 days

## 2016-02-17 DIAGNOSIS — R509 Fever, unspecified: Secondary | ICD-10-CM

## 2016-02-17 MED ORDER — DOCUSATE SODIUM 100 MG PO CAPS
200.0000 mg | ORAL_CAPSULE | Freq: Two times a day (BID) | ORAL | Status: DC
  Administered 2016-02-17: 200 mg via ORAL
  Filled 2016-02-17: qty 2

## 2016-02-17 MED ORDER — PREDNISONE 20 MG PO TABS: 20.0000 mg | ORAL_TABLET | Freq: Every day | ORAL | 0 refills | Status: DC

## 2016-02-17 MED ORDER — BISACODYL 10 MG RE SUPP: 10.0000 mg | Freq: Every day | RECTAL | Status: DC

## 2016-02-17 MED ORDER — ENSURE ENLIVE PO LIQD
237.0000 mL | Freq: Two times a day (BID) | ORAL | 0 refills | Status: AC
Start: 2016-02-17 — End: ?

## 2016-02-17 MED ORDER — DOCUSATE SODIUM 100 MG PO CAPS: 200.0000 mg | ORAL_CAPSULE | Freq: Two times a day (BID) | ORAL | 0 refills | Status: DC | PRN

## 2016-02-17 MED ORDER — AMLODIPINE BESYLATE 5 MG PO TABS
5.0000 mg | ORAL_TABLET | Freq: Every day | ORAL | Status: DC
  Administered 2016-02-17: 5 mg via ORAL
  Filled 2016-02-17: qty 1

## 2016-02-17 NOTE — Discharge Instructions (Signed)
Follow with Primary MD ANDY,CAMILLE L, MD in 7 days   Get CBC, CMP, 2 view Chest X ray checked  by Primary MD or SNF MD in 5-7 days ( we routinely change or add medications that can affect your baseline labs and fluid status, therefore we recommend that you get the mentioned basic workup next visit with your PCP, your PCP may decide not to get them or add new tests based on their clinical decision)   Activity: As tolerated with Full fall precautions use walker/cane & assistance as needed   Disposition Home     Diet:   Heart Healthy    For Heart failure patients - Check your Weight same time everyday, if you gain over 2 pounds, or you develop in leg swelling, experience more shortness of breath or chest pain, call your Primary MD immediately. Follow Cardiac Low Salt Diet and 1.5 lit/day fluid restriction.   On your next visit with your primary care physician please Get Medicines reviewed and adjusted.   Please request your Prim.MD to go over all Hospital Tests and Procedure/Radiological results at the follow up, please get all Hospital records sent to your Prim MD by signing hospital release before you go home.   If you experience worsening of your admission symptoms, develop shortness of breath, life threatening emergency, suicidal or homicidal thoughts you must seek medical attention immediately by calling 911 or calling your MD immediately  if symptoms less severe.  You Must read complete instructions/literature along with all the possible adverse reactions/side effects for all the Medicines you take and that have been prescribed to you. Take any new Medicines after you have completely understood and accpet all the possible adverse reactions/side effects.   Do not drive, operate heavy machinery, perform activities at heights, swimming or participation in water activities or provide baby sitting services if your were admitted for syncope or siezures until you have seen by Primary MD or a  Neurologist and advised to do so again.  Do not drive when taking Pain medications.    Do not take more than prescribed Pain, Sleep and Anxiety Medications  Special Instructions: If you have smoked or chewed Tobacco  in the last 2 yrs please stop smoking, stop any regular Alcohol  and or any Recreational drug use.  Wear Seat belts while driving.   Please note  You were cared for by a hospitalist during your hospital stay. If you have any questions about your discharge medications or the care you received while you were in the hospital after you are discharged, you can call the unit and asked to speak with the hospitalist on call if the hospitalist that took care of you is not available. Once you are discharged, your primary care physician will handle any further medical issues. Please note that NO REFILLS for any discharge medications will be authorized once you are discharged, as it is imperative that you return to your primary care physician (or establish a relationship with a primary care physician if you do not have one) for your aftercare needs so that they can reassess your need for medications and monitor your lab values.

## 2016-02-17 NOTE — Discharge Summary (Signed)
[name]:[id number] DOB: 02-27-1923 DOA: 02/14/2016  PCP: [name], MD  Admit date: 02/14/2016  Discharge date: 02/17/2016  Admitted From: Home   Disposition:  Home   Recommendations for Outpatient Follow-up:   Follow up with PCP in 1-2 weeks  PCP Please obtain BMP/CBC, 2 view CXR in 1week,  (see Discharge instructions)   PCP Please follow up on the following pending results: Review CT scan and MRI results below, check ESR and CRP in 1 week. Needs rheumatology follow-up in one week.   Home Health: PT, RN, home health aide, social worker   Equipment/Devices: [facility] Rile if she qualifies Consultations: PT Discharge Condition: Stable  CODE STATUS: Full   Diet Recommendation: Heart Healthy   Chief Complaint  Patient presents with  . Back Pain  . Weakness     Brief history of present illness from the day of admission and additional interim summary    [name] is a 82 y.o. female known to me from her recent admission due to symptomatic bradycardia who has a medical history significant of seasonal allergies, osteoarthritis, depression, endometriosis, GERD, hearing loss (he uses hearing aids), hyperlipidemia, hypertension, osteoporosis, spinal stenosis who was referred by her PCP due to above complaints.   Per patient, she has been having worsening back pain for the past month. She has been having low-grade temperatures for the past few days, but denies earache, sore throat, rhinorrhea, nausea, emesis, diarrhea, dysuria or hematuria. She denies chest pain, dyspnea, palpitations, diaphoresis, pitting edema of the lower extremities.   ED Course: Workup shows mild anemia, mild leukocytosis, hyponatremia 128 mmol per liter. Chest x-ray does not show any acute cardiopulmonary process, MRI of the LS spine  shows mild worsening of protrusion of L4-5 disc and associated findings, when compared to MRI done in 2010. CT scan of the abdomen shows a distended gallbladder.   Hospital issues addressed     1.Upper extremity weakness along with fatigue and low-grade fevers. In the setting of ESR of 120 highly suggestive of PMR, started on low-dose prednisone with remarkable improvement, no headache or jaw claudication. No vision changes. Will be evaluated by PT, home PT RN and social work also ordered, if she qualifies for a DME equipment will be ordered. Patient was ready for discharge yesterday but requested the rounding physician yesterday to stay 1 more night. Today she is close to her baseline and will be discharged home. I request PCP to kindly review CT and MRI reports below, repeat ESR and CRP one week and arrange for outpatient rheumatology follow-up ASAP.  2. Hypertension. Continue Norvasc.  3. GERD. On PPI.  4. Anxiety depression, continue home regimen follow with PCP. She had few hallucinations with delirium last night which have completely resolved.  5. Mild hyponatremia. Hydrated repeat CMP in a week by PCP  6. History of spinal stenosis. Outpatient follow-up with PCP with age-appropriate outpatient workup.  7. Stool burden on CT scan. Placed on bowel regimen.    Discharge diagnosis     Principal  Problem:   Fever, unspecified Active Problems:   Hypertension   Hyperlipidemia   GERD (gastroesophageal reflux disease)   Spinal stenosis   Constipation   Depression   Anxiety   Anemia   Hyponatremia    Discharge instructions    Discharge Instructions    Diet - low sodium heart healthy    Complete by:  As directed   Discharge instructions    Complete by:  As directed   Follow with Primary MD ANDY,CAMILLE L, MD in 7 days   Get CBC, CMP, 2 view Chest X ray checked  by Primary MD or SNF MD in 5-7 days ( we routinely change or add medications that can affect your baseline labs  and fluid status, therefore we recommend that you get the mentioned basic workup next visit with your PCP, your PCP may decide not to get them or add new tests based on their clinical decision)   Activity: As tolerated with Full fall precautions use walker/cane & assistance as needed   Disposition Home     Diet:   Heart Healthy    For Heart failure patients - Check your Weight same time everyday, if you gain over 2 pounds, or you develop in leg swelling, experience more shortness of breath or chest pain, call your Primary MD immediately. Follow Cardiac Low Salt Diet and 1.5 lit/day fluid restriction.   On your next visit with your primary care physician please Get Medicines reviewed and adjusted.   Please request your Prim.MD to go over all Hospital Tests and Procedure/Radiological results at the follow up, please get all Hospital records sent to your Prim MD by signing hospital release before you go home.   If you experience worsening of your admission symptoms, develop shortness of breath, life threatening emergency, suicidal or homicidal thoughts you must seek medical attention immediately by calling 911 or calling your MD immediately  if symptoms less severe.  You Must read complete instructions/literature along with all the possible adverse reactions/side effects for all the Medicines you take and that have been prescribed to you. Take any new Medicines after you have completely understood and accpet all the possible adverse reactions/side effects.   Do not drive, operate heavy machinery, perform activities at heights, swimming or participation in water activities or provide baby sitting services if your were admitted for syncope or siezures until you have seen by Primary MD or a Neurologist and advised to do so again.  Do not drive when taking Pain medications.    Do not take more than prescribed Pain, Sleep and Anxiety Medications  Special Instructions: If you have smoked or  chewed Tobacco  in the last 2 yrs please stop smoking, stop any regular Alcohol  and or any Recreational drug use.  Wear Seat belts while driving.   Please note  You were cared for by a hospitalist during your hospital stay. If you have any questions about your discharge medications or the care you received while you were in the hospital after you are discharged, you can call the unit and asked to speak with the hospitalist on call if the hospitalist that took care of you is not available. Once you are discharged, your primary care physician will handle any further medical issues. Please note that NO REFILLS for any discharge medications will be authorized once you are discharged, as it is imperative that you return to your primary care physician (or establish a relationship with a primary care physician if you do not  have one) for your aftercare needs so that they can reassess your need for medications and monitor your lab values.   Increase activity slowly    Complete by:  As directed      Discharge Medications     Medication List    TAKE these medications   acetaminophen 325 MG tablet Commonly known as:  TYLENOL Take 650 mg by mouth every 4 (four) hours as needed for mild pain, moderate pain, fever or headache.   amLODipine 5 MG tablet Commonly known as:  NORVASC Take 1 tablet (5 mg total) by mouth daily.   aspirin EC 81 MG tablet Take 81 mg by mouth daily.   bisacodyl 5 MG EC tablet Commonly known as:  DULCOLAX Take 1 tablet (5 mg total) by mouth daily as needed for moderate constipation.   cholecalciferol 1000 units tablet Commonly known as:  VITAMIN D Take 2,000 Units by mouth daily.   clonazePAM 1 MG tablet Commonly known as:  KLONOPIN Take 0.5 mg by mouth 3 (three) times daily as needed for anxiety.   docusate sodium 100 MG capsule Commonly known as:  COLACE Take 2 capsules (200 mg total) by mouth 2 (two) times daily as needed for mild constipation.   feeding  supplement (ENSURE ENLIVE) Liqd Take 237 mLs by mouth 2 (two) times daily between meals. Give 2 week supply   fluticasone 50 MCG/ACT nasal spray Commonly known as:  FLONASE Place 2 sprays into both nostrils daily as needed for allergies.   multivitamin tablet Take 1 tablet by mouth daily.   omeprazole 20 MG capsule Commonly known as:  PRILOSEC Take 1 capsule (20 mg total) by mouth daily. What changed:  when to take this  reasons to take this   ondansetron 4 MG tablet Commonly known as:  ZOFRAN Take 2-4 mg by mouth every 8 (eight) hours as needed for nausea or vomiting.   polyethylene glycol packet Commonly known as:  MIRALAX / GLYCOLAX Take 17 g by mouth 2 (two) times daily.   predniSONE 20 MG tablet Commonly known as:  DELTASONE Take 1 tablet (20 mg total) by mouth daily with breakfast.   sertraline 25 MG tablet Commonly known as:  ZOLOFT Take 75 mg by mouth daily.   simvastatin 20 MG tablet Commonly known as:  ZOCOR TAKE 1 TABLET EVERY DAY What changed:  See the new instructions.   traMADol 50 MG tablet Commonly known as:  ULTRAM Take 50 mg by mouth 2 (two) times daily.   triamcinolone cream 0.1 % Commonly known as:  KENALOG Place 1 application vaginally 2 (two) times daily as needed (for itching).       Follow-up Information    ANDY,CAMILLE L, MD. Schedule an appointment as soon as possible for a visit in 3 day(s).   Specialty:  Family Medicine Contact information: [address] [place] [address] [phone]        Campbell Haapala, MD. Schedule an appointment as soon as possible for a visit in 1 week(s).   Specialty:  Rheumatology Contact information: [place] [address] [phone]           Major procedures and Radiology Reports - PLEASE review detailed and final reports thoroughly  -       Dg Chest 2 View  Result Date: 02/14/2016 CLINICAL DATA:  Fevers EXAM: CHEST  2 VIEW  COMPARISON:  02/11/2016 FINDINGS: Cardiac shadow is mildly enlarged but stable from  the previous exam. The lungs are well aerated bilaterally. No focal infiltrate or sizable effusion is seen. No bony abnormality is noted. IMPRESSION: No active cardiopulmonary disease. Electronically Signed   By: Inez Catalina M.D.   On: 02/14/2016 23:28       Mr Lumbar Spine Wo Contrast  Result Date: 02/14/2016 CLINICAL DATA:  Initial evaluation for acute back pain. EXAM: MRI LUMBAR SPINE WITHOUT CONTRAST TECHNIQUE: Multiplanar, multisequence MR imaging of the lumbar spine was performed. No intravenous contrast was administered. COMPARISON:  Prior MRI from 12/10/2008. FINDINGS: Segmentation: Normal segmentation. Lowest well-formed disc is presumed to be the L5-S1 level. Alignment: Trace anterolisthesis of L3 on L4, L4 on L5, and L5 on S1, stable. Vertebral bodies otherwise normally aligned with preservation of the normal lumbar lordosis. Vertebrae: Vertebral body heights maintained. No acute or chronic fracture. Signal intensity within the vertebral body bone marrow somewhat heterogeneous without worrisome osseous lesion. Few scattered benign hemangiomas noted. No marrow edema. Conus medullaris: Extends to the L1 level and appears normal. Paraspinal and other soft tissues: Paraspinous soft tissues demonstrate no acute abnormality. Mild fatty atrophy noted within the paraspinous musculature. Disc levels: T11-12: Seen only on sagittal projection. Disc desiccation with mild diffuse disc bulge. No stenosis. T12-L1: Seen only on sagittal projection. Disc desiccation with mild diffuse disc bulge without focal disc herniation. No significant stenosis. L1-2: Mild diffuse disc bulge with disc desiccation and intervertebral disc space narrowing. Mild bile facet arthrosis with ligamentum flavum hypertrophy. Mild canal stenosis is stable from prior. No significant foraminal encroachment. L2-3: Mild diffuse disc bulge with disc desiccation  intervertebral disc space narrowing, slightly progressed from previous. Bilateral facet arthrosis ligamentum flavum hypertrophy, also progressed. Resultant mild canal stenosis is slightly worsened. No significant foraminal encroachment. L3-4: Trace anterolisthesis of L3 and L4. Resultant mild diffuse disc bulge with disc desiccation. Bilateral facet arthrosis with ligamentum flavum hypertrophy, slightly progressed. Resultant mild canal and lateral recess stenosis, slightly progressed. Mild bilateral foraminal stenosis out frank nerve root impingement. L4-5: Trace anterolisthesis of L4 on L5. Broad-based shallow central disc protrusion indents upon the ventral thecal sac. Progressive facet and ligamentum flavum hypertrophy. Slightly worsened lateral recess stenosis with mild to moderate canal narrowing. Mild bilateral foraminal stenosis, left greater than right is similar. L5-S1: Trace anterolisthesis of L5 on S1. Shallow central disc protrusion indents upon the ventral thecal sac without significant stenosis. No neural impingement. Moderate bilateral facet arthrosis, similar to previous. Foramina remain widely patent. IMPRESSION: 1. No acute abnormality within the lumbar spine. 2. Broad base disc protrusion at L4-5 with superimposed facet disease, resulting in mild to moderate canal and lateral recess stenosis, mildly worsened from previous. 3. Additional more mild multilevel degenerative spondylolysis and facet disease as detailed above, overall slightly progressed relative to most recent MRI from 12/10/2008. No high-grade spinal stenosis or evidence of nerve root compression. Electronically Signed   By: Jeannine Boga M.D.   On: 02/14/2016 22:34  Ct Abdomen Pelvis W Contrast  Result Date: 02/15/2016 CLINICAL DATA:  82 year old female with elevated altered process. EXAM: CT ABDOMEN AND PELVIS WITH CONTRAST TECHNIQUE: Multidetector CT imaging of the abdomen and pelvis was performed using the standard  protocol following bolus administration of intravenous contrast. CONTRAST:  143m ISOVUE-300 IOPAMIDOL (ISOVUE-300) INJECTION 61% COMPARISON:  Abdominal radiograph dated 02/11/2016 and ultrasound dated 02/14/2009 FINDINGS: The visualized lung bases are clear. Calcified mitral annulus. There is coronary vascular calcification. There is dilatation of the right atrium. No intra-abdominal free air.  Small free fluid within  the pelvis. Mild hepatomegaly the gallbladder is distended. No calcified stone or pericholecystic fluid. The pancreas, spleen, adrenal glands, kidneys, visualized ureters, and urinary bladder appear unremarkable. The uterus is retroverted and grossly unremarkable. Evaluation of the pelvic structures is limited due to streak artifact caused by a right hip arthroplasty. There is extensive sigmoid diverticulosis with muscular hypertrophy. No definite active inflammatory changes. There is moderate stool throughout the colon. No evidence of bowel obstruction or active inflammation. Normal appendix. There is moderate aortoiliac atherosclerotic disease. The IVC appears unremarkable. The origins of the celiac axis, SMA, IMA as well as the origins of the renal arteries are patent. No portal venous gas identified. There is no adenopathy. The abdominal wall soft tissues appear unremarkable. There is osteopenia with degenerative changes of the spine. There is osteoarthritic changes of the left hip. Total right hip arthroplasty. No acute fracture. IMPRESSION: Sigmoid diverticulosis without definite evidence acute diverticulitis. No evidence of bowel obstruction. Small free fluid within the pelvis of indeterminate etiology. Clinical correlation is recommended. Distended gallbladder. No calcified stone or pericholecystic fluid. Ultrasound may provide better evaluation of the gallbladder. Electronically Signed   By: Anner Crete M.D.   On: 02/15/2016 01:14  Nm Hepato W/eject Fract  Result Date:  02/15/2016 CLINICAL DATA:  Abdominal pain and fever for 3 weeks EXAM: NUCLEAR MEDICINE HEPATOBILIARY IMAGING WITH GALLBLADDER EF Views: Anterior right upper quadrant RADIOPHARMACEUTICALS:  5.01 mCi Tc-43m Choletec IV COMPARISON:  None. FINDINGS: Liver uptake of radiotracer is normal. There is prompt visualization of gallbladder and small bowel, consistent with patency of the cystic and common bile ducts. The patient consumed 8 ounces of Ensure orally with calculation of the computer generated ejection fraction of radiotracer from the gallbladder. The patient did not experience clinical symptoms with the oral Ensure consumption. The computer generated ejection fraction of radiotracer from the gallbladder is normal at 100%, normal greater than 33% using the oral agent. IMPRESSION: Study within normal limits. Electronically Signed   By: WLowella GripIII M.D.   On: 02/15/2016 16:44     [place]domen Limited Ruq  Result Date: 02/15/2016 CLINICAL DATA:  Fever and back pain. EXAM: [place]DOM[place] LIMITED - RIGHT UPPER QUADRANT COMPARISON:  Abdominal CT 02/15/2016 FINDINGS: Gallbladder: Mild distention of the gallbladder which is similar to the recent CT. Gallbladder measures 9.8 cm and length. No gallbladder wall thickening and no evidence for gallstones or sludge. Patient does not have a sonographic Murphy's sign. No evidence for pericholecystic fluid. Common bile duct: Diameter: 5 mm. Liver: No focal lesion identified. Within normal limits in parenchymal echogenicity. IMPRESSION: Mild gallbladder distention without inflammatory changes and no evidence for stones. Electronically Signed   By: AMarkus DaftM.D.   On: 02/15/2016 09:30   Micro Results     Recent Results (from the past 240 hour(s))  Urine culture     Status: None   Collection Time: 02/14/16  6:44 PM  Result Value Ref Range Status   Specimen Description URINE, CATHETERIZED  Final   Special Requests NONE  Final   Culture NO GROWTH  Final   Report  Status 02/15/2016 FINAL  Final  Culture, blood (Routine X 2) w Reflex to ID Panel     Status: None (Preliminary result)   Collection Time: 02/14/16 11:34 PM  Result Value Ref Range Status   Specimen Description BLOOD LEFT ARM  Final   Special Requests BOTTLES DRAWN AEROBIC AND ANAEROBIC 5ML  Final   Culture NO GROWTH 1 DAY  Final   Report Status PENDING  Incomplete  Culture, blood (Routine X 2) w Reflex to ID Panel     Status: None (Preliminary result)   Collection Time: 02/14/16 11:41 PM  Result Value Ref Range Status   Specimen Description BLOOD RIGHT ARM  Final   Special Requests BOTTLES DRAWN AEROBIC AND ANAEROBIC 5ML  Final   Culture NO GROWTH 1 DAY  Final   Report Status PENDING  Incomplete    Today   Subjective    [name] today has no headache,no chest abdominal pain,no new weakness tingling or numbness, feels much better wants to go home today.     Objective   Blood pressure (!) 178/87, pulse 64, temperature 98.3 F (36.8 C), temperature source Oral, resp. rate 16, height 5' 3"  (1.6 m), weight 64 kg (141 lb), SpO2 98 %.   Intake/Output Summary (Last 24 hours) at 02/17/16 0831 Last data filed at 02/17/16 0600  Gross per 24 hour  Intake              680 ml  Output                0 ml  Net              680 ml    Exam Awake Alert, Oriented x 3, No new F.N deficits, Normal affect [place].AT,PERRAL Supple Neck,No JVD, No cervical lymphadenopathy appriciated.  Symmetrical Chest wall movement, Good air movement bilaterally, CTAB RRR,No Gallops,Rubs or new Murmurs, No Parasternal Heave +ve B.Sounds, Abd Soft, Non tender, No organomegaly appriciated, No rebound -guarding or rigidity. No Cyanosis, Clubbing or edema, No new Rash or bruise   Data Review   CBC w Diff:  Lab Results  Component Value Date   WBC 10.2 02/16/2016   HGB 10.1 (L) 02/16/2016   HCT 31.9 (L) 02/16/2016   PLT 456 (H) 02/16/2016   LYMPHOPCT 8 02/16/2016   MONOPCT 8 02/16/2016   EOSPCT 1  02/16/2016   BASOPCT 0 02/16/2016    CMP:  Lab Results  Component Value Date   NA 132 (L) 02/16/2016   K 4.0 02/16/2016   CL 96 (L) 02/16/2016   CO2 28 02/16/2016   BUN 16 02/16/2016   CREATININE 0.63 02/16/2016   PROT 6.3 (L) 02/16/2016   ALBUMIN 2.3 (L) 02/16/2016   BILITOT 0.4 02/16/2016   ALKPHOS 300 (H) 02/16/2016   AST 27 02/16/2016   ALT 29 02/16/2016  . ESR 120 CRP  17.4  Total Time in preparing paper work, data evaluation and todays exam - 35 minutes  Thurnell Lose M.D on 02/17/2016 at 8:31 AM  [facility]   Office  [phone]

## 2016-02-17 NOTE — Progress Notes (Signed)
Pt states has rolling walker at home. Susie Cassette RN

## 2016-02-17 NOTE — Progress Notes (Signed)
Physical Therapy Evaluation and Discharge Patient Details Name: [name] MRN: [id number] DOB: 09-25-1922 Today's Date: 02/17/2016   History of Present Illness  82 yo female admitted with generalized weakness, new diagnosis of polymyalgia rheumatica, bradycardia, depression. Hx of OA, osteoporosis, spinal stenosis, depression, HTN  Clinical Impression  Pt. Was napping in bed when PT arrived for eval. Pt. Was AOx3 throughout treatment. Pt demonstrated muscular strength within normal limits and ambulated well with RW. Ambulation was attempted with pt w/o RW but pt became unsteady and RW use was continued. Pt no longer needs Acute PT due to likely discharge today. Safe to d/c home with Home Health PT. Pt will benefit from home discharge with Home Health PT to continue BUE and BLE strengthening and ROM as well as balance and mobility training. Acute PT to sign off.     Follow Up Recommendations Home health PT    Equipment Recommendations  Rolling walker with 5" wheels    Recommendations for Other Services       Precautions / Restrictions Precautions Precautions: Fall Precaution Comments: anxiety, depression Restrictions Weight Bearing Restrictions: No      Mobility  Bed Mobility Overal bed mobility: Independent             General bed mobility comments: Pt. was in bed when PT arrived and ended on commode  Transfers Overall transfer level: Independent Equipment used: Rolling walker (2 wheeled) Transfers: Sit to/from Stand Sit to Stand: Supervision         General transfer comment: for safety  Ambulation/Gait Ambulation/Gait assistance: Min guard Ambulation Distance (Feet): 200 Feet Assistive device: Rolling walker (2 wheeled) Gait Pattern/deviations: Step-through pattern;Decreased stride length Gait velocity: WFL Gait velocity interpretation: Below normal speed for age/gender General Gait Details: slow gait speed. close guard for safety. no LOB.  Ambulated for  approx. 5 steps w/o RW but became unsteady. Resumed RW use.   Stairs            Wheelchair Mobility    Modified Rankin (Stroke Patients Only)       Balance Overall balance assessment: Needs assistance;History of Falls Sitting-balance support: Feet supported       Standing balance support: Bilateral upper extremity supported                                 Pertinent Vitals/Pain Pain Assessment: No/denies pain    Home Living Family/patient expects to be discharged to:: Private residence Living Arrangements: Spouse/significant other (husband unable to assist, daugther lives close by) Available Help at Discharge: Family;Home health Type of Home: House Home Access: Stairs to enter Entrance Stairs-Rails: Can reach both Entrance Stairs-Number of Steps: 3 Home Layout: One level Home Equipment: Environmental consultant - 2 wheels;Tub bench;Bedside commode      Prior Function Level of Independence: Independent with assistive device(s) (requires RW for time being)               Hand Dominance        Extremity/Trunk Assessment   Upper Extremity Assessment: Overall WFL for tasks assessed           Lower Extremity Assessment: Overall WFL for tasks assessed      Cervical / Trunk Assessment: Kyphotic  Communication   Communication: No difficulties  Cognition Arousal/Alertness: Awake/alert   Overall Cognitive Status: Within Functional Limits for tasks assessed  General Comments      Exercises General Exercises - Upper Extremity Shoulder Flexion: AROM;Strengthening;Both (MMT BL 4/5) Shoulder ABduction: AROM;Strengthening;Both (MMT BL 4/5) Elbow Flexion: AROM;Strengthening;Both (MMT BL 5/5) General Exercises - Lower Extremity Hip Flexion/Marching: AROM;Strengthening;Both (MMT BL 5/5)      Assessment/Plan    PT Assessment All further PT needs can be met in the next venue of care  PT Diagnosis Difficulty walking (Weakness  was assessed through MMT and ambulation)   PT Problem List Decreased strength;Decreased balance;Decreased mobility  PT Treatment Interventions     PT Goals (Current goals can be found in the Care Plan section) Acute Rehab PT Goals Patient Stated Goal: go home PT Goal Formulation: With patient Time For Goal Achievement: 02/17/16 Potential to Achieve Goals: Good    Frequency     Barriers to discharge        Co-evaluation               End of Session   Activity Tolerance: Patient tolerated treatment well Patient left:  (On BSC, with call bell/phone within reach) Nurse Communication: Mobility status; communicated pt was on commode    Functional Assessment Tool Used: Clinical Judgement    Time: 1040-1108 PT Time Calculation (min) (ACUTE ONLY): 28 min   Charges:         PT G Codes:   PT G-Codes **NOT FOR INPATIENT CLASS** Functional Assessment Tool Used: Clinical Judgement    Crissie Sickles, SPT Acute Rehab Services  Crissie Sickles 02/17/2016, 11:44 AM

## 2016-02-17 NOTE — Care Management Note (Signed)
Case Management Note  Patient Details  Name: BREEA LONCAR MRN: [id number] Date of Birth: Jan 03, 1923  Subjective/Objective:                    Action/Plan: CM met with patient at bedside concerning discharge plans and recommendations. Patient reports being active with [facility] services. Patient plans on resuming services. Referral faxed to [facility], fax confirmation received. Patient verbalized understanding, teach back done. No    Expected Discharge Date:    7/30 /17             Expected Discharge Plan:  [place]  In-House Referral:     Discharge planning Services  CM Consult  Post Acute Care Choice:  Resumption of Svcs/PTA Provider Choice offered to:  Patient  DME Arranged:    DME Agency:     HH Arranged:  RN, PT, Nurse's Aide [facility] Agency:  [place]  Status of Service:   completed signed off  If discussed at [place] of Stay Meetings, dates discussed:    Additional CommentsLaurena Slimmer, RN 02/17/2016, 12:04 PM

## 2016-02-19 ENCOUNTER — Ambulatory Visit (INDEPENDENT_AMBULATORY_CARE_PROVIDER_SITE_OTHER): Payer: 59 | Admitting: Psychology

## 2016-02-19 DIAGNOSIS — F331 Major depressive disorder, recurrent, moderate: Secondary | ICD-10-CM

## 2016-02-20 LAB — CULTURE, BLOOD (ROUTINE X 2)
CULTURE: NO GROWTH
Culture: NO GROWTH

## 2016-02-26 ENCOUNTER — Ambulatory Visit (INDEPENDENT_AMBULATORY_CARE_PROVIDER_SITE_OTHER): Payer: 59 | Admitting: Psychology

## 2016-02-26 DIAGNOSIS — F331 Major depressive disorder, recurrent, moderate: Secondary | ICD-10-CM

## 2016-03-10 ENCOUNTER — Ambulatory Visit (INDEPENDENT_AMBULATORY_CARE_PROVIDER_SITE_OTHER): Payer: 59 | Admitting: Psychology

## 2016-03-10 DIAGNOSIS — F331 Major depressive disorder, recurrent, moderate: Secondary | ICD-10-CM

## 2016-03-18 ENCOUNTER — Ambulatory Visit (INDEPENDENT_AMBULATORY_CARE_PROVIDER_SITE_OTHER): Payer: 59 | Admitting: Psychiatry

## 2016-03-18 ENCOUNTER — Encounter (HOSPITAL_COMMUNITY): Payer: Self-pay | Admitting: Psychiatry

## 2016-03-18 VITALS — BP 118/70 | HR 66 | Ht 63.0 in | Wt 142.6 lb

## 2016-03-18 DIAGNOSIS — F331 Major depressive disorder, recurrent, moderate: Secondary | ICD-10-CM

## 2016-03-18 MED ORDER — MIRTAZAPINE 30 MG PO TABS: 30.0000 mg | ORAL_TABLET | Freq: Every day | ORAL | 2 refills | Status: DC

## 2016-03-18 NOTE — Progress Notes (Signed)
Psychiatric Initial Adult Assessment   Patient Identification: [name] MRN:  [id number] Date of Evaluation:  03/18/2016 Referral Source: The community Chief Complaint:   I feel so depressed  Visit Diagnosis: Major depression, recurrent mild  History of Present Illness:  This patient is a 82 year old white married mother is beginning to be evaluated for worsening depression. In reality her depression began about 2 months ago with the death of Sonia Baller, her dog. Interestingly the last week or so the patient is beginning to be better. Presently she lives in her own home with her 29 year old husband who is not well. He has congestive heart failure and the patient spends a great deal of time taking care of him. The patient is been married to him for 44 years. The patient has difficulty communicating with him as he has mild dementia and he is essentially bedridden. The patient had 4 children 2 of whom died. She has 6 grandchildren. Some of them have conflicts amongst himself but overall she does not worry that much about them. Financially she is fairly stable. She's had a death of a number of close friends the last year but the most important death was of course her dog who is 24 years of age. The patient describes persistent daily depression albeit a little better now. She sleeps fairly well long she takes a half of the 1 mg Klonopin at night and another half if she needs it later in the night. Her appetite is good her energy level is stable she is now able to think and concentrate better. She's back to reading again. She denies worthlessness. She denies psychomotor agitation or slowing. She denies being suicidal now is never made a suicide attempt. She denies being suspicious or somatic. The patient denies the use of alcohol. She denies any psychotic symptoms at all. It is unclear if she's ever experience an episode of major depression in the last number of decades. It should be noted 50 years ago she did  have a period of depression and received ECT treatment with good success. She's never been depressed again since that the patient was recently medically hospitalized was diagnosed with polymyalgia rheumatica. She was placed on prednisone a relatively high dose of 20 mg now is being reduced to 15 mg. The patient denies any episodes of mania. She denies any anxiety consistent with generalized anxiety disorder panic disorder or obsessive-compulsive disorder. The patient 20 or 30 years ago was seen by a psychiatrist in your did therapy with her as well. Presently she's under the care of Dr. Cristy Folks has her therapist this community. The patient says normally she takes guitar lessons and helps with immigrants. Her ability to enjoy things are clearly been reduced. The patient is a Masters degree in education and generally is very articulate and engaging. Depression Symptoms:  depressed mood, (Hypo) Manic Symptoms:   Anxiety Symptoms:   Psychotic Symptoms:   PTSD Symptoms:   Past Psychiatric History: One psychiatric hospitalization with a course of ECT  Previous Psychotropic Medications: Yes   Substance Abuse History in the last 12 months:  No.  Consequences of Substance Abuse: Negative  Past Medical History:  Past Medical History:  Diagnosis Date  . Allergy   . Arthritis   . Depression   . Endometriosis   . GERD (gastroesophageal reflux disease)   . Hearing loss 12-08-11   bil  . Hyperlipidemia   . Hypertension   . Osteoporosis   . Ovarian cyst   . S/P  right THA, AA 12/16/2011  . Spinal stenosis     Past Surgical History:  Procedure Laterality Date  . CATARACT EXTRACTION, BILATERAL  12-08-11   bil  . DILATION AND CURETTAGE OF UTERUS    . HERNIA REPAIR    . HYSTEROSCOPY    . OOPHORECTOMY     bilateral  . STERIOD INJECTION  12/09/11   to spine for rt calf pain and spasms  . TIBIA FRACTURE SURGERY     trauma  . TOTAL HIP ARTHROPLASTY  12/16/2011   Procedure: TOTAL HIP  ARTHROPLASTY ANTERIOR APPROACH;  Surgeon: Mauri Pole, MD;  Location: [facility] ORS;  Service: Orthopedics;  Laterality: Right;    Family Psychiatric History:   Family History:  Family History  Problem Relation Age of Onset  . Stroke Sister   . Diabetes Daughter   . Cancer Father     esophogeal  . Cancer Paternal Aunt     stomach  . Breast cancer Maternal Grandmother     Age 77    Social History:   Social History   Social History  . Marital status: Married    Spouse name: N/A  . Number of children: N/A  . Years of education: N/A   Social History Main Topics  . Smoking status: Former Research scientist (life sciences)  . Smokeless tobacco: Never Used  . Alcohol use No  . Drug use: No  . Sexual activity: No   Other Topics Concern  . None   Social History Narrative  . None    Additional Social History:   Allergies:   Allergies  Allergen Reactions  . Cartia Xt [Diltiazem] Other (See Comments)    BRADYCARDIA IN 40'S  . Codeine Other (See Comments)    syncope  . Lisinopril-Hydrochlorothiazide Cough  . Mercury Rash  . Sulfonamide Derivatives Rash    Metabolic Disorder Labs: No results found for: HGBA1C, MPG No results found for: PROLACTIN Lab Results  Component Value Date   CHOL 176 03/25/2013   TRIG 118.0 03/25/2013   HDL 70.10 03/25/2013   CHOLHDL 3 03/25/2013   VLDL 23.6 03/25/2013   LDLCALC 82 03/25/2013   LDLCALC 67 03/29/2012     Current Medications: Current Outpatient Prescriptions  Medication Sig Dispense Refill  . acetaminophen (TYLENOL) 325 MG tablet Take 650 mg by mouth every 4 (four) hours as needed for mild pain, moderate pain, fever or headache.     Marland Kitchen amLODipine (NORVASC) 5 MG tablet Take 1 tablet (5 mg total) by mouth daily. 30 tablet 0  . aspirin EC 81 MG tablet Take 81 mg by mouth daily.    . bisacodyl (DULCOLAX) 5 MG EC tablet Take 1 tablet (5 mg total) by mouth daily as needed for moderate constipation. 30 tablet 0  . cholecalciferol (VITAMIN D) 1000 UNITS  tablet Take 2,000 Units by mouth daily.     . clonazePAM (KLONOPIN) 1 MG tablet Take 0.5 mg by mouth 3 (three) times daily as needed for anxiety.     . docusate sodium (COLACE) 100 MG capsule Take 2 capsules (200 mg total) by mouth 2 (two) times daily as needed for mild constipation. 30 capsule 0  . feeding supplement, ENSURE ENLIVE, (ENSURE ENLIVE) LIQD Take 237 mLs by mouth 2 (two) times daily between meals. Give 2 week supply 237 mL 0  . fluticasone (FLONASE) 50 MCG/ACT nasal spray Place 2 sprays into both nostrils daily as needed for allergies.     . Multiple Vitamin (MULTIVITAMIN) tablet Take 1 tablet by  mouth daily.      Marland Kitchen omeprazole (PRILOSEC) 20 MG capsule Take 1 capsule (20 mg total) by mouth daily. (Patient taking differently: Take 20 mg by mouth daily as needed (for heartburn). ) 90 capsule 0  . ondansetron (ZOFRAN) 4 MG tablet Take 2-4 mg by mouth every 8 (eight) hours as needed for nausea or vomiting.    . polyethylene glycol (MIRALAX / GLYCOLAX) packet Take 17 g by mouth 2 (two) times daily. 14 each 0  . predniSONE (DELTASONE) 20 MG tablet Take 1 tablet (20 mg total) by mouth daily with breakfast. 30 tablet 0  . sertraline (ZOLOFT) 25 MG tablet Take 75 mg by mouth daily.     . simvastatin (ZOCOR) 20 MG tablet TAKE 1 TABLET EVERY DAY (Patient taking differently: TAKE 20 MG BY MOUTH EVERY EVENING) 90 tablet 0  . traMADol (ULTRAM) 50 MG tablet Take 50 mg by mouth 2 (two) times daily.    Marland Kitchen triamcinolone cream (KENALOG) 0.1 % Place 1 application vaginally 2 (two) times daily as needed (for itching).    . mirtazapine (REMERON) 30 MG tablet Take 1 tablet (30 mg total) by mouth at bedtime. 30 tablet 2   No current facility-administered medications for this visit.     Neurologic: Headache: No Seizure: No Paresthesias:No  Musculoskeletal: Strength & Muscle Tone: within normal limits Gait & Station: normal Patient leans: N/A  Psychiatric Specialty Exam: ROS  Blood pressure 118/70,  pulse 66, height 5\' 3"  (1.6 m), weight 142 lb 9.6 oz (64.7 kg).Body mass index is 25.26 kg/m.  General Appearance: Fairly Groomed  Eye Contact:  Good  Speech:  Clear and Coherent  Volume:  Normal  Mood:  NA  Affect:  Appropriate  Thought Process:  Coherent  Orientation:  Full (Time, Place, and Person)  Thought Content:  WDL  Suicidal Thoughts:  No  Homicidal Thoughts:  No  Memory:  NA  Judgement:  NA  Insight:  Good  Psychomotor Activity:  Normal  Concentration:  Recall:  Good  Fund of Knowledge:Good  Language: Good  Akathisia:  No  Handed:  Right  AIMS (if indicated):    Assets:  Desire for Improvement  ADL's:  Intact  Cognition: WNL  Sleep:      Treatment Plan Summary: At this time I believe the patient is experiencing a recurrent episode of major depression. Things in a response to the grieving process from her dog, Sonia Baller. Today the patient's baseline is that she is under a lot of constant stress from the impending death of her husband of 49 years. He is chronically ill bed ridden and she feels a lot of stress of taking care of him. I think the death of her dog essentially clear over into a state of being depressed. The patient has been on Remeron 15 mg at this time we'll go ahead and increase it to a better dose of 30 mg. She actually is eating fairly well. She's tried Ativan in the past with problems and therefore she really likes the Klonopin as it is being given. I think we'll continue that. She'll continue in psychotherapy and will afford her prednisone dose further being reduced and ultimately tapered off. I think once her prednisone is gone in time passes her mood will continue to improve. The patient in fact admits that even now her mood is better. She's willing to accept friends like her friend Marvene Staff take her out driving. Overall she's clear improving. She's never been suicidal. She'll return  to see me in 2 months.    Haskel Schroeder, MD 8/29/20172:55  PM

## 2016-04-08 ENCOUNTER — Ambulatory Visit (INDEPENDENT_AMBULATORY_CARE_PROVIDER_SITE_OTHER): Payer: 59 | Admitting: Psychology

## 2016-04-08 DIAGNOSIS — F331 Major depressive disorder, recurrent, moderate: Secondary | ICD-10-CM

## 2016-04-24 ENCOUNTER — Ambulatory Visit: Payer: 59 | Admitting: Psychology

## 2016-05-01 ENCOUNTER — Ambulatory Visit (INDEPENDENT_AMBULATORY_CARE_PROVIDER_SITE_OTHER): Payer: 59 | Admitting: Psychology

## 2016-05-01 DIAGNOSIS — F331 Major depressive disorder, recurrent, moderate: Secondary | ICD-10-CM

## 2016-05-06 ENCOUNTER — Ambulatory Visit (INDEPENDENT_AMBULATORY_CARE_PROVIDER_SITE_OTHER): Payer: 59 | Admitting: Psychology

## 2016-05-06 DIAGNOSIS — F331 Major depressive disorder, recurrent, moderate: Secondary | ICD-10-CM | POA: Diagnosis not present

## 2016-05-20 ENCOUNTER — Ambulatory Visit: Payer: 59 | Admitting: Psychology

## 2016-05-27 ENCOUNTER — Ambulatory Visit (INDEPENDENT_AMBULATORY_CARE_PROVIDER_SITE_OTHER): Payer: 59 | Admitting: Psychology

## 2016-05-27 DIAGNOSIS — F331 Major depressive disorder, recurrent, moderate: Secondary | ICD-10-CM | POA: Diagnosis not present

## 2016-06-03 ENCOUNTER — Ambulatory Visit: Payer: 59 | Admitting: Psychology

## 2016-06-16 ENCOUNTER — Ambulatory Visit: Payer: 59 | Admitting: Psychology

## 2016-06-17 ENCOUNTER — Ambulatory Visit (INDEPENDENT_AMBULATORY_CARE_PROVIDER_SITE_OTHER): Payer: 59 | Admitting: Psychology

## 2016-06-17 DIAGNOSIS — F4323 Adjustment disorder with mixed anxiety and depressed mood: Secondary | ICD-10-CM | POA: Diagnosis not present

## 2016-07-01 ENCOUNTER — Other Ambulatory Visit (HOSPITAL_COMMUNITY): Payer: Self-pay | Admitting: Psychiatry

## 2016-07-08 ENCOUNTER — Ambulatory Visit: Payer: Self-pay | Admitting: Psychology

## 2016-07-15 ENCOUNTER — Ambulatory Visit: Payer: 59 | Admitting: Psychology

## 2016-08-04 ENCOUNTER — Ambulatory Visit: Payer: 59 | Admitting: Psychology

## 2016-08-26 ENCOUNTER — Ambulatory Visit (INDEPENDENT_AMBULATORY_CARE_PROVIDER_SITE_OTHER): Payer: 59 | Admitting: Psychology

## 2016-08-26 DIAGNOSIS — F331 Major depressive disorder, recurrent, moderate: Secondary | ICD-10-CM | POA: Diagnosis not present

## 2016-09-17 ENCOUNTER — Ambulatory Visit (INDEPENDENT_AMBULATORY_CARE_PROVIDER_SITE_OTHER): Payer: 59 | Admitting: Psychology

## 2016-09-17 DIAGNOSIS — F331 Major depressive disorder, recurrent, moderate: Secondary | ICD-10-CM | POA: Diagnosis not present

## 2016-09-30 ENCOUNTER — Ambulatory Visit: Payer: 59 | Admitting: Psychology

## 2016-10-09 ENCOUNTER — Ambulatory Visit (INDEPENDENT_AMBULATORY_CARE_PROVIDER_SITE_OTHER): Payer: 59 | Admitting: Psychology

## 2016-10-09 DIAGNOSIS — F331 Major depressive disorder, recurrent, moderate: Secondary | ICD-10-CM

## 2016-10-15 ENCOUNTER — Ambulatory Visit (INDEPENDENT_AMBULATORY_CARE_PROVIDER_SITE_OTHER): Payer: 59 | Admitting: Psychology

## 2016-10-15 DIAGNOSIS — F331 Major depressive disorder, recurrent, moderate: Secondary | ICD-10-CM | POA: Diagnosis not present

## 2016-10-31 ENCOUNTER — Ambulatory Visit (INDEPENDENT_AMBULATORY_CARE_PROVIDER_SITE_OTHER): Payer: 59 | Admitting: Psychology

## 2016-10-31 DIAGNOSIS — F331 Major depressive disorder, recurrent, moderate: Secondary | ICD-10-CM | POA: Diagnosis not present

## 2016-11-12 ENCOUNTER — Ambulatory Visit (INDEPENDENT_AMBULATORY_CARE_PROVIDER_SITE_OTHER): Payer: 59 | Admitting: Psychology

## 2016-11-12 DIAGNOSIS — F331 Major depressive disorder, recurrent, moderate: Secondary | ICD-10-CM

## 2016-11-19 ENCOUNTER — Ambulatory Visit (INDEPENDENT_AMBULATORY_CARE_PROVIDER_SITE_OTHER): Payer: 59 | Admitting: Psychology

## 2016-11-19 DIAGNOSIS — F331 Major depressive disorder, recurrent, moderate: Secondary | ICD-10-CM | POA: Diagnosis not present

## 2016-11-26 ENCOUNTER — Ambulatory Visit (INDEPENDENT_AMBULATORY_CARE_PROVIDER_SITE_OTHER): Payer: 59 | Admitting: Psychology

## 2016-11-26 DIAGNOSIS — F331 Major depressive disorder, recurrent, moderate: Secondary | ICD-10-CM

## 2016-12-04 ENCOUNTER — Ambulatory Visit (INDEPENDENT_AMBULATORY_CARE_PROVIDER_SITE_OTHER): Payer: 59 | Admitting: Psychology

## 2016-12-04 DIAGNOSIS — F331 Major depressive disorder, recurrent, moderate: Secondary | ICD-10-CM

## 2016-12-10 ENCOUNTER — Ambulatory Visit (INDEPENDENT_AMBULATORY_CARE_PROVIDER_SITE_OTHER): Payer: 59 | Admitting: Psychology

## 2016-12-10 DIAGNOSIS — F331 Major depressive disorder, recurrent, moderate: Secondary | ICD-10-CM | POA: Diagnosis not present

## 2016-12-24 ENCOUNTER — Ambulatory Visit (INDEPENDENT_AMBULATORY_CARE_PROVIDER_SITE_OTHER): Payer: 59 | Admitting: Psychology

## 2016-12-24 DIAGNOSIS — F331 Major depressive disorder, recurrent, moderate: Secondary | ICD-10-CM | POA: Diagnosis not present

## 2017-01-01 ENCOUNTER — Ambulatory Visit (INDEPENDENT_AMBULATORY_CARE_PROVIDER_SITE_OTHER): Payer: 59 | Admitting: Psychology

## 2017-01-01 DIAGNOSIS — F331 Major depressive disorder, recurrent, moderate: Secondary | ICD-10-CM

## 2017-01-07 ENCOUNTER — Ambulatory Visit (INDEPENDENT_AMBULATORY_CARE_PROVIDER_SITE_OTHER): Payer: 59 | Admitting: Psychology

## 2017-01-07 DIAGNOSIS — F331 Major depressive disorder, recurrent, moderate: Secondary | ICD-10-CM

## 2017-01-15 ENCOUNTER — Ambulatory Visit (INDEPENDENT_AMBULATORY_CARE_PROVIDER_SITE_OTHER): Payer: 59 | Admitting: Psychology

## 2017-01-15 DIAGNOSIS — F331 Major depressive disorder, recurrent, moderate: Secondary | ICD-10-CM | POA: Diagnosis not present

## 2017-01-26 ENCOUNTER — Ambulatory Visit (INDEPENDENT_AMBULATORY_CARE_PROVIDER_SITE_OTHER): Payer: 59 | Admitting: Psychology

## 2017-01-26 DIAGNOSIS — F331 Major depressive disorder, recurrent, moderate: Secondary | ICD-10-CM

## 2017-02-04 ENCOUNTER — Ambulatory Visit (INDEPENDENT_AMBULATORY_CARE_PROVIDER_SITE_OTHER): Payer: 59 | Admitting: Psychology

## 2017-02-04 DIAGNOSIS — F331 Major depressive disorder, recurrent, moderate: Secondary | ICD-10-CM | POA: Diagnosis not present

## 2017-02-12 ENCOUNTER — Ambulatory Visit (INDEPENDENT_AMBULATORY_CARE_PROVIDER_SITE_OTHER): Payer: 59 | Admitting: Psychology

## 2017-02-12 DIAGNOSIS — F331 Major depressive disorder, recurrent, moderate: Secondary | ICD-10-CM | POA: Diagnosis not present

## 2017-03-03 ENCOUNTER — Ambulatory Visit (INDEPENDENT_AMBULATORY_CARE_PROVIDER_SITE_OTHER): Payer: 59 | Admitting: Psychology

## 2017-03-03 DIAGNOSIS — F331 Major depressive disorder, recurrent, moderate: Secondary | ICD-10-CM

## 2017-03-12 ENCOUNTER — Ambulatory Visit (INDEPENDENT_AMBULATORY_CARE_PROVIDER_SITE_OTHER): Payer: 59 | Admitting: Psychology

## 2017-03-12 DIAGNOSIS — F331 Major depressive disorder, recurrent, moderate: Secondary | ICD-10-CM

## 2017-03-17 ENCOUNTER — Ambulatory Visit (INDEPENDENT_AMBULATORY_CARE_PROVIDER_SITE_OTHER): Payer: 59 | Admitting: Psychology

## 2017-03-17 DIAGNOSIS — F331 Major depressive disorder, recurrent, moderate: Secondary | ICD-10-CM | POA: Diagnosis not present

## 2017-03-18 ENCOUNTER — Ambulatory Visit: Payer: 59 | Admitting: Psychology

## 2017-03-26 ENCOUNTER — Ambulatory Visit (INDEPENDENT_AMBULATORY_CARE_PROVIDER_SITE_OTHER): Payer: Medicare Other | Admitting: Psychology

## 2017-03-26 DIAGNOSIS — F331 Major depressive disorder, recurrent, moderate: Secondary | ICD-10-CM | POA: Diagnosis not present

## 2017-04-01 ENCOUNTER — Ambulatory Visit (INDEPENDENT_AMBULATORY_CARE_PROVIDER_SITE_OTHER): Payer: Medicare Other | Admitting: Psychology

## 2017-04-01 DIAGNOSIS — F331 Major depressive disorder, recurrent, moderate: Secondary | ICD-10-CM | POA: Diagnosis not present

## 2017-04-02 ENCOUNTER — Ambulatory Visit: Payer: Self-pay | Admitting: Psychology

## 2017-04-09 ENCOUNTER — Ambulatory Visit (INDEPENDENT_AMBULATORY_CARE_PROVIDER_SITE_OTHER): Payer: Medicare Other | Admitting: Psychology

## 2017-04-09 DIAGNOSIS — F331 Major depressive disorder, recurrent, moderate: Secondary | ICD-10-CM | POA: Diagnosis not present

## 2017-04-15 ENCOUNTER — Ambulatory Visit (INDEPENDENT_AMBULATORY_CARE_PROVIDER_SITE_OTHER): Payer: Medicare Other | Admitting: Psychology

## 2017-04-15 DIAGNOSIS — F331 Major depressive disorder, recurrent, moderate: Secondary | ICD-10-CM | POA: Diagnosis not present

## 2017-04-16 ENCOUNTER — Ambulatory Visit: Payer: Self-pay | Admitting: Psychology

## 2017-04-23 ENCOUNTER — Ambulatory Visit (INDEPENDENT_AMBULATORY_CARE_PROVIDER_SITE_OTHER): Payer: Medicare Other | Admitting: Psychology

## 2017-04-23 ENCOUNTER — Ambulatory Visit: Payer: Self-pay | Admitting: Psychology

## 2017-04-23 DIAGNOSIS — F331 Major depressive disorder, recurrent, moderate: Secondary | ICD-10-CM | POA: Diagnosis not present

## 2017-04-25 IMAGING — CR DG SHOULDER 2+V*R*
2 series · 2 of 2 positions shown · non-contrast
Comparison: None.

CLINICAL DATA: Right shoulder pain for 3 days.

EXAM:
RIGHT SHOULDER - 2+ VIEW

[shoulder grashey]
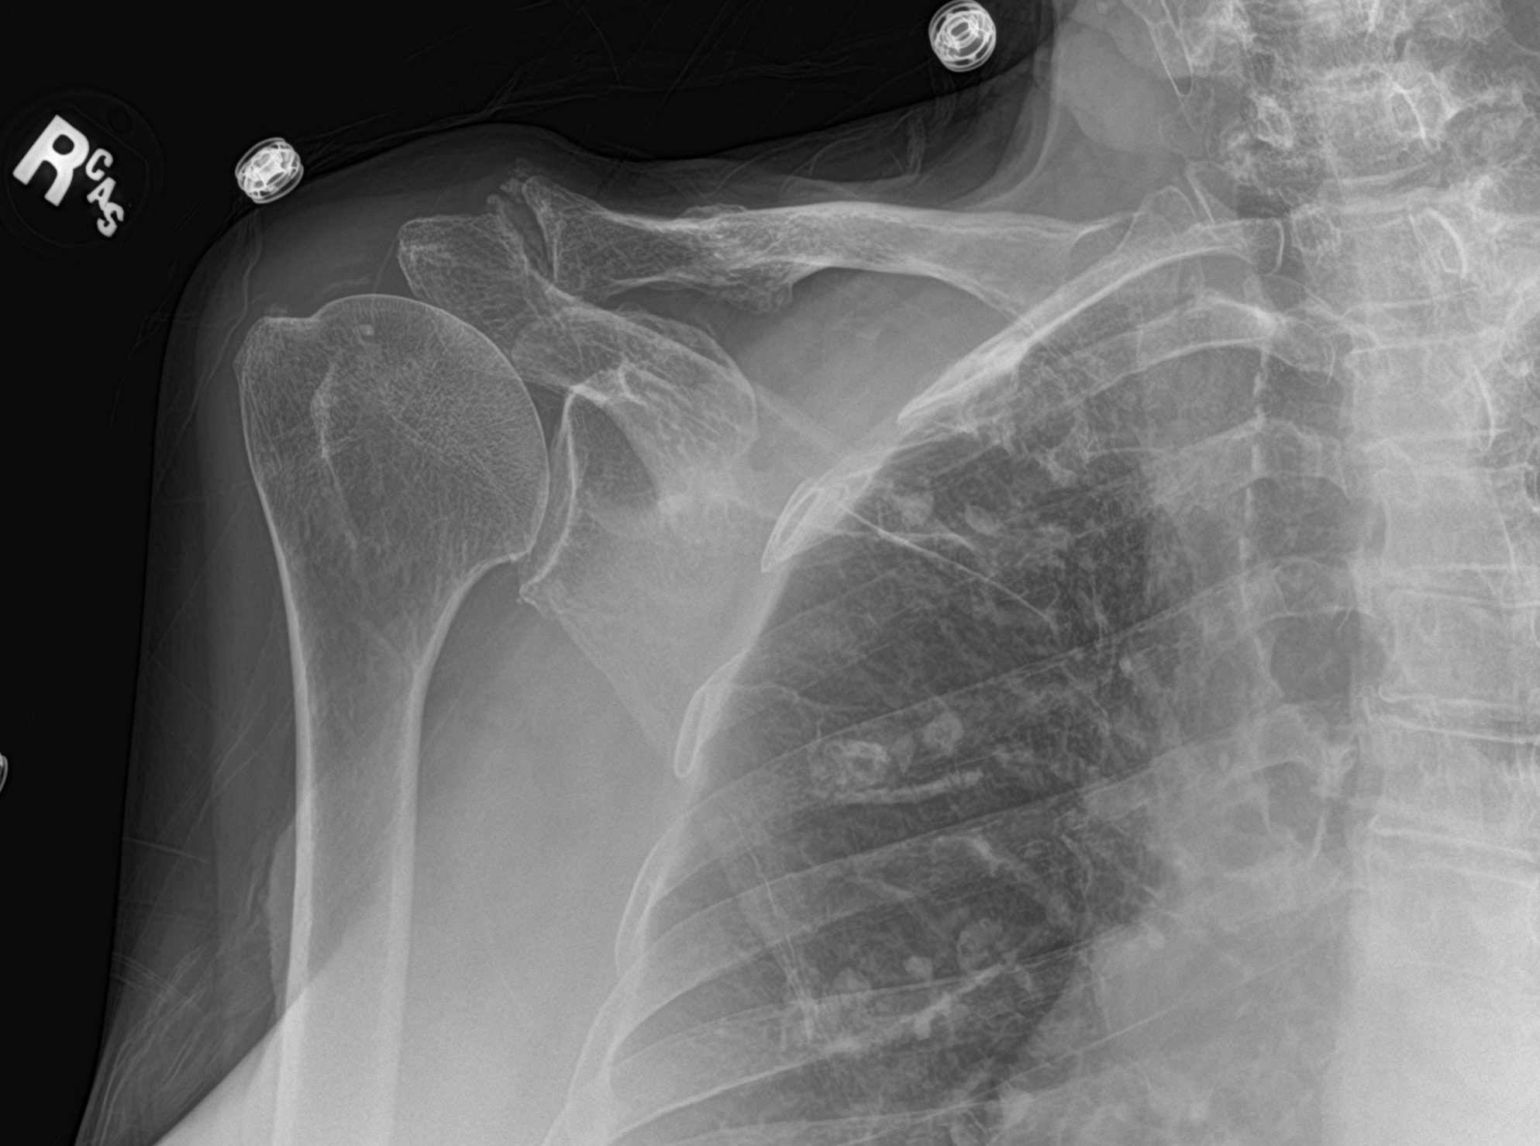

[shoulder y view]
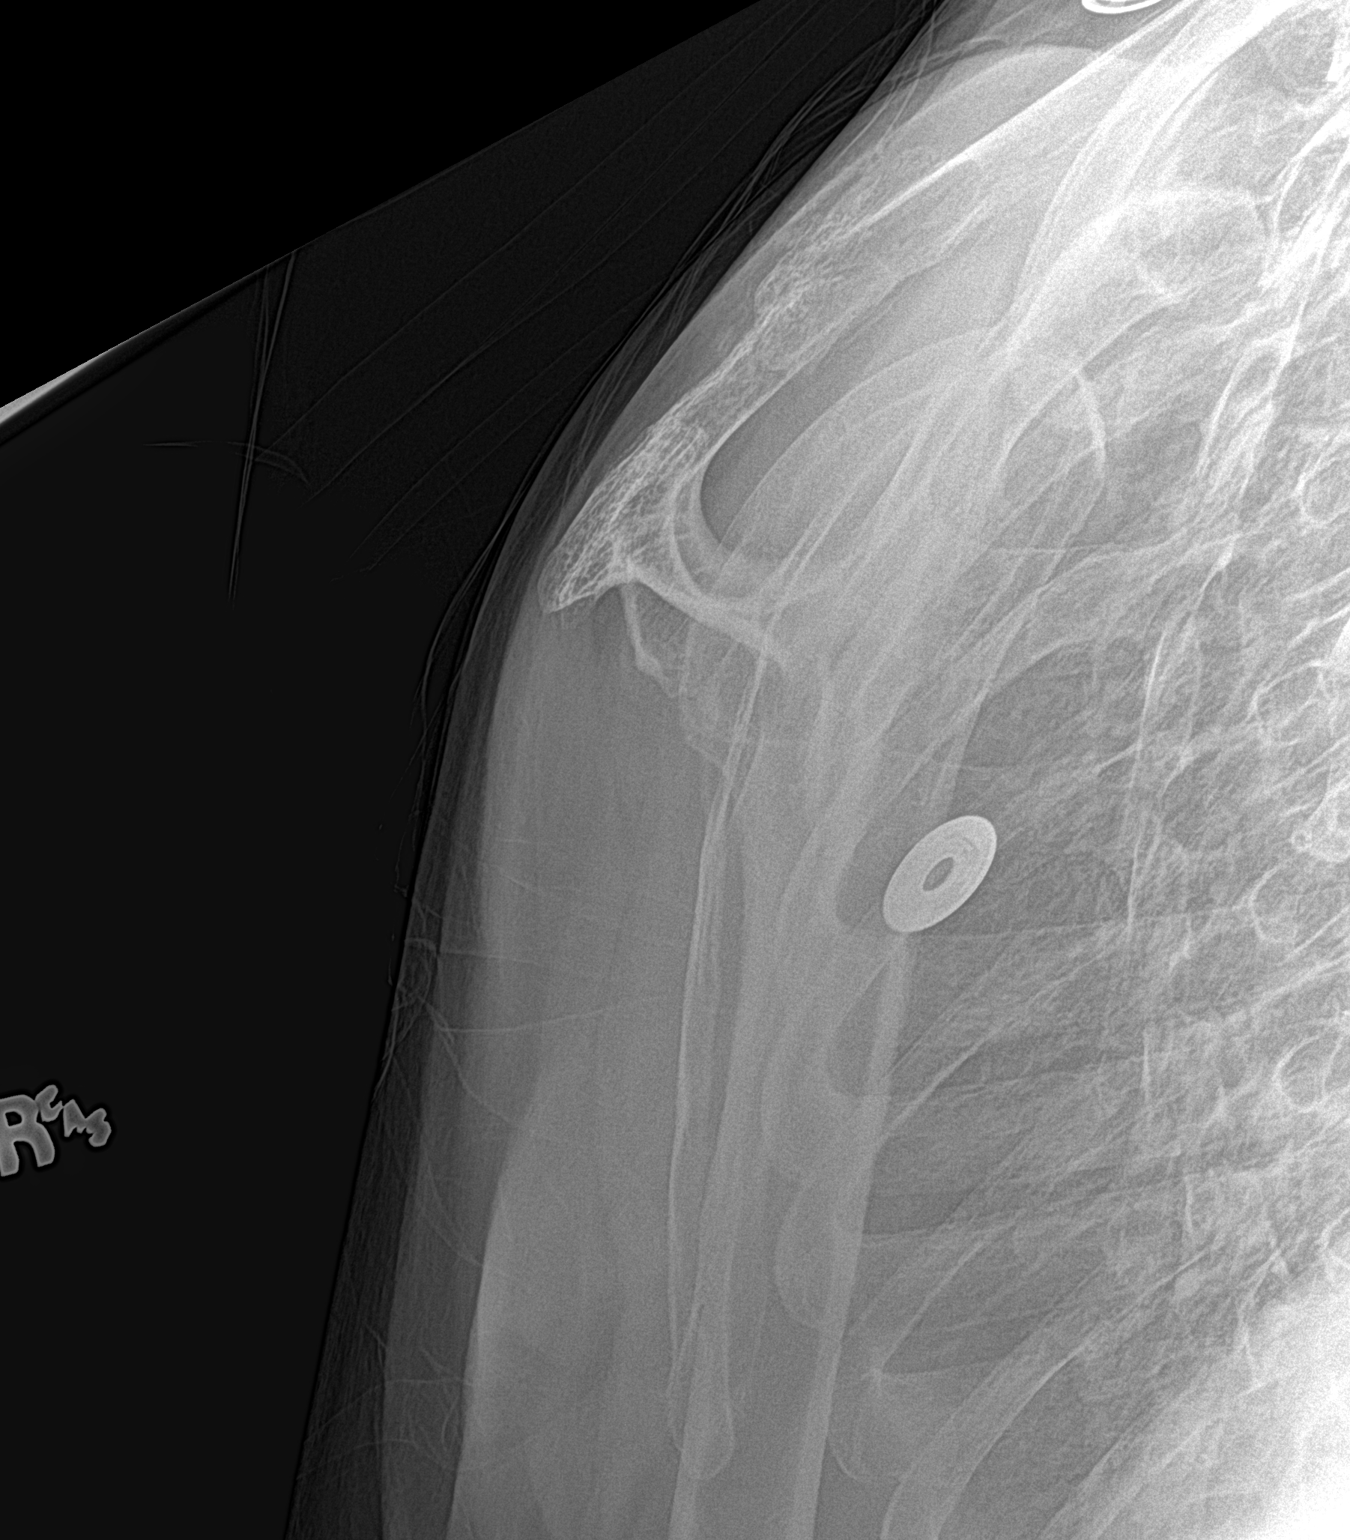

[2 of 2 positions shown; findings below may reference images not displayed]

FINDINGS: There is no evidence of acute fracture or dislocation. Moderate
osteoarthritic changes of the glenohumeral joint. Chronic rotator
cuff injury is evident by the high-riding humerus. Osteoarthritic
changes of the acromioclavicular joint is also seen. Cortical
irregularity and callus formation in the distal right clavicle are
suggestive of subacute or chronic posttraumatic changes. Soft
tissues are unremarkable.
IMPRESSION: No radiographic evidence of acute fracture or dislocation identified
about the right shoulder.

Chronic rotator cuff injury.

Osteoarthritic changes of the glenohumeral and acromioclavicular
joints.

Subacute or chronic posttraumatic changes in the distal right
clavicle, incompletely evaluated due to positioning.

## 2017-04-28 IMAGING — MR MR LUMBAR SPINE W/O CM
4 of 5 series · 17 of 48 positions shown · non-contrast
Comparison: Prior MRI from 12/10/2008.

CLINICAL DATA: Initial evaluation for acute back pain.

EXAM:
MRI LUMBAR SPINE WITHOUT CONTRAST
TECHNIQUE: Multiplanar, multisequence MR imaging of the lumbar spine was
performed. No intravenous contrast was administered.

[Series 3: T2 · sagittal · 4.0mm · 0.55mm/px · 6 of 15 slices shown (1 of 2)]
[im 1/15]
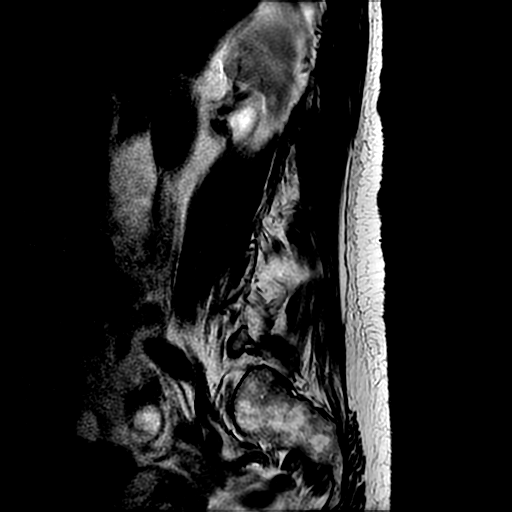
[im 3/15]
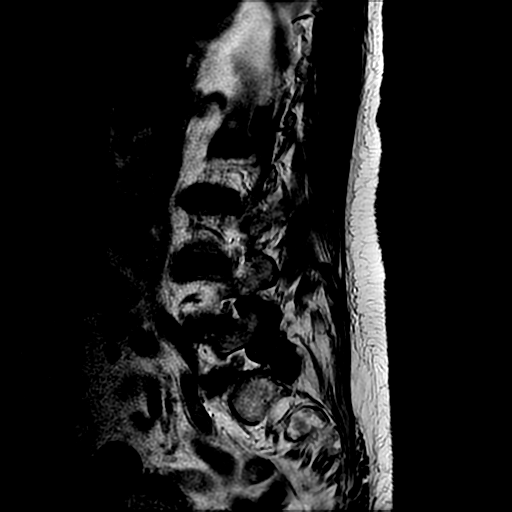
[im 6/15]
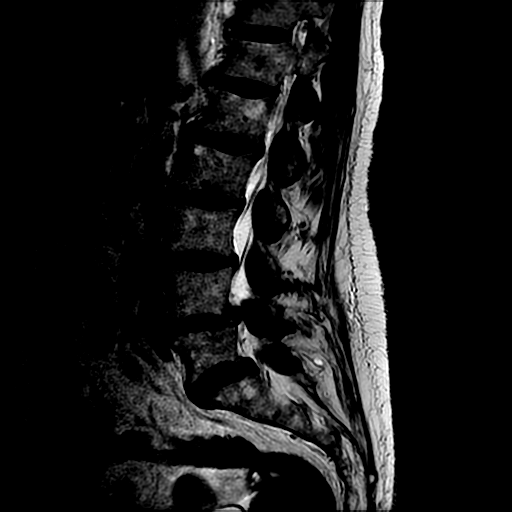
[im 9/15]
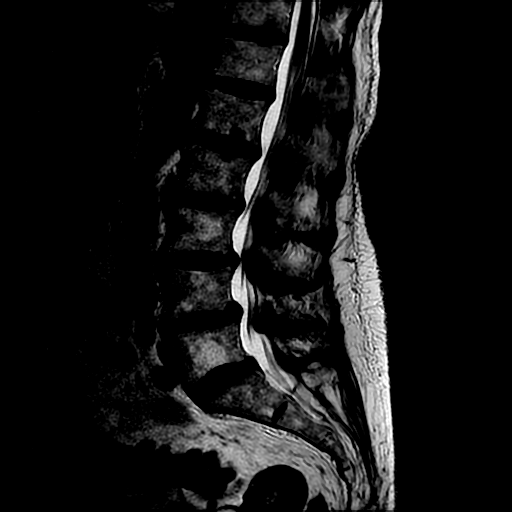
[im 12/15]
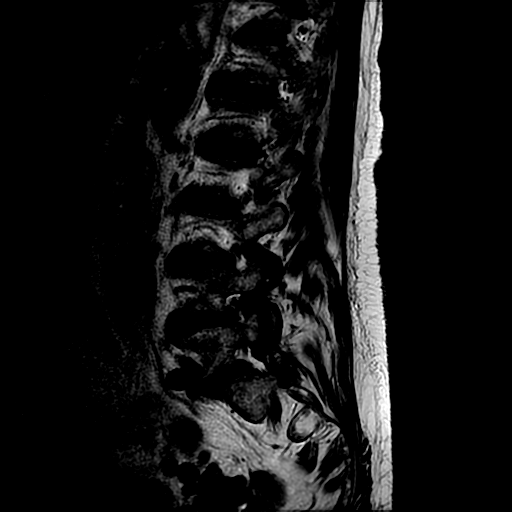
[im 15/15]
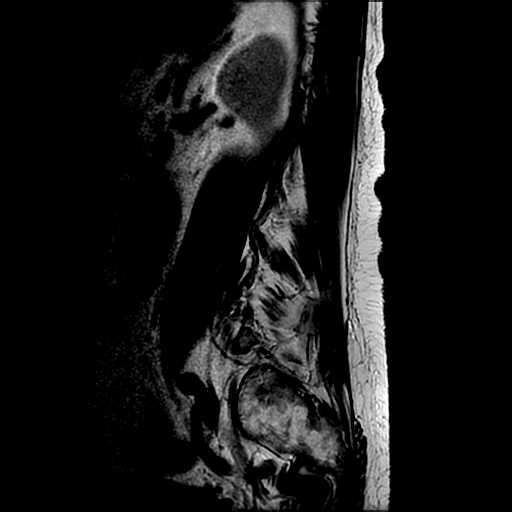

[Series 4: T1 · sagittal · 4.0mm · 0.55mm/px · 3 of 15 slices shown (1 of 2)]
[im 3/15]
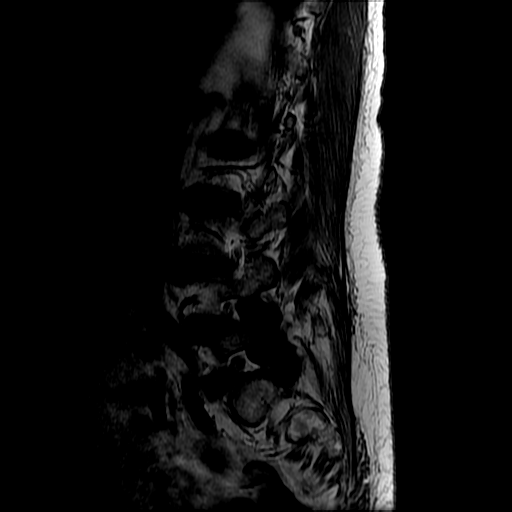
[im 8/15]
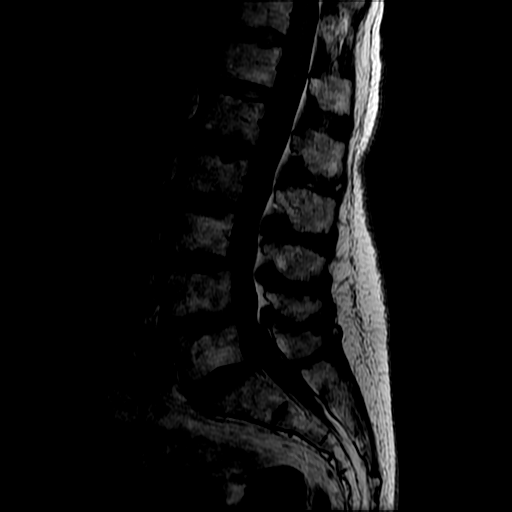
[im 12/15]
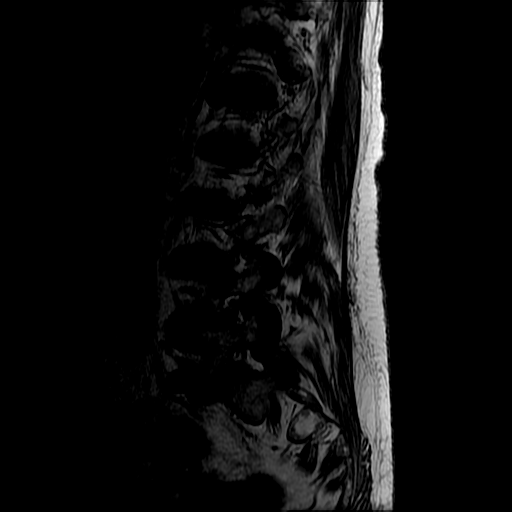

[Series 6: T2 · axial · 4.0mm · 0.39mm/px · z∈[-59,+94]mm · 5 of 32 slices shown (2 of 2)]
[im 1/32]
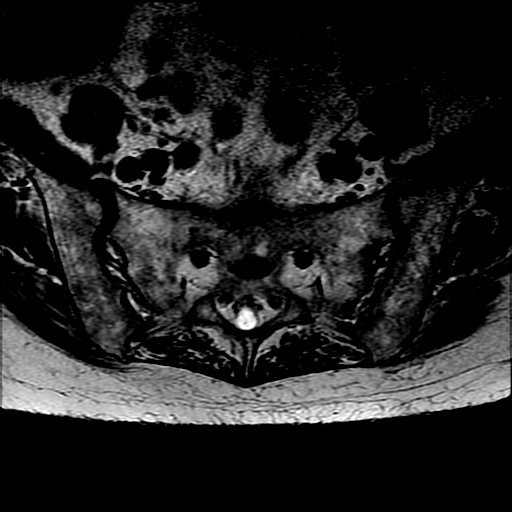
[im 5/32]
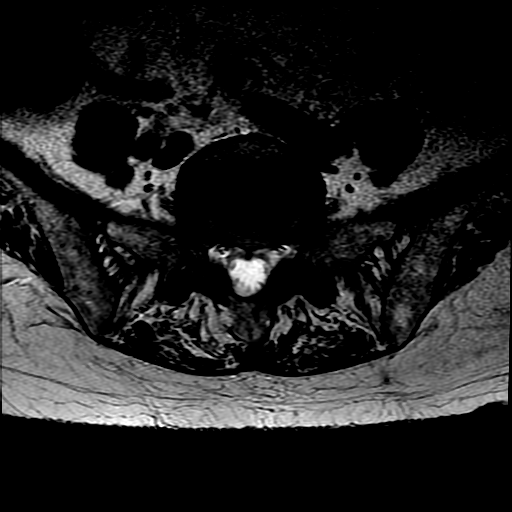
[im 10/32]
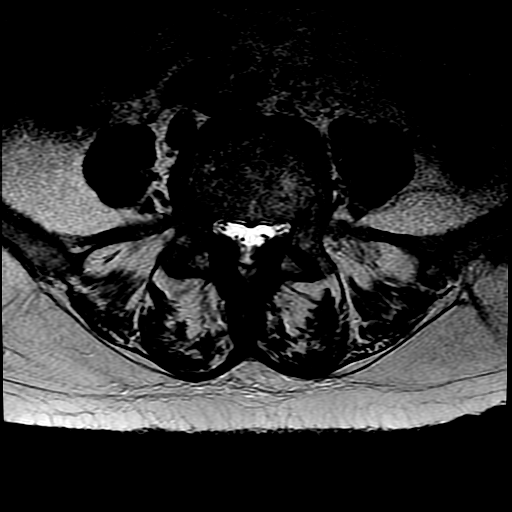
[im 17/32]
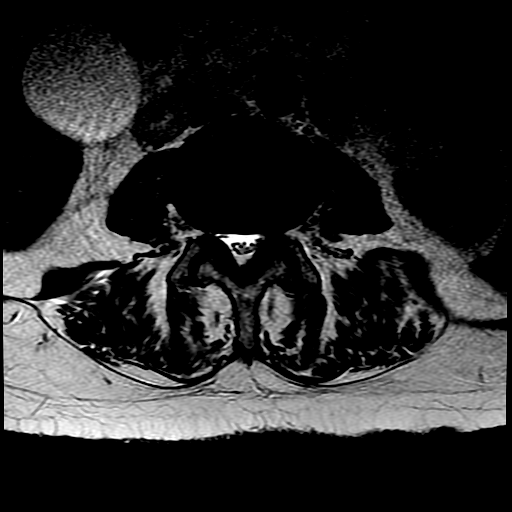
[im 27/32]
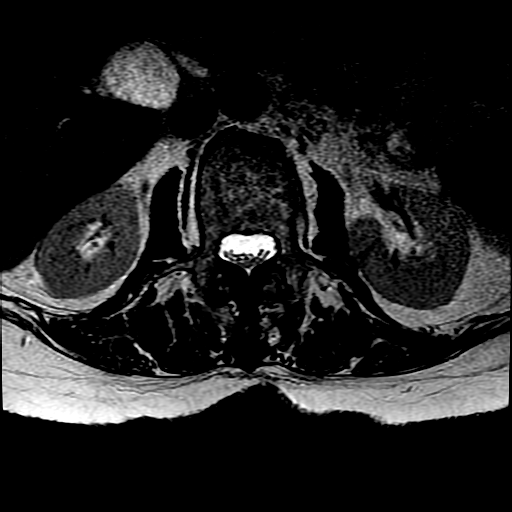

[Series 7: T1 · axial · 4.0mm · 0.39mm/px · z∈[-39,+94]mm · 3 of 32 slices shown (2 of 2)]
[im 5/32]
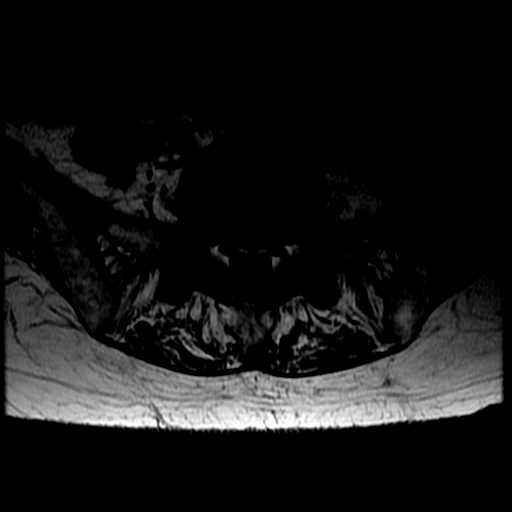
[im 17/32]
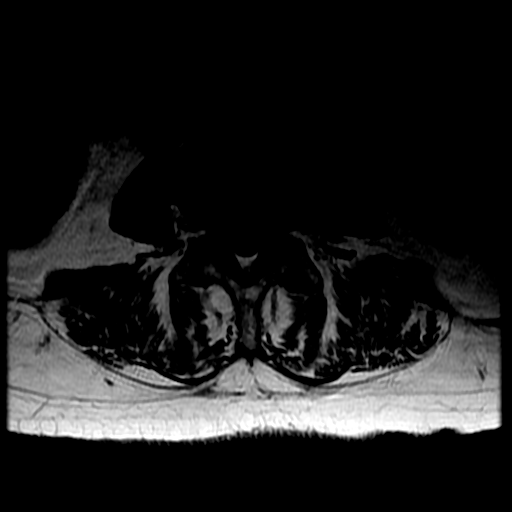
[im 27/32]
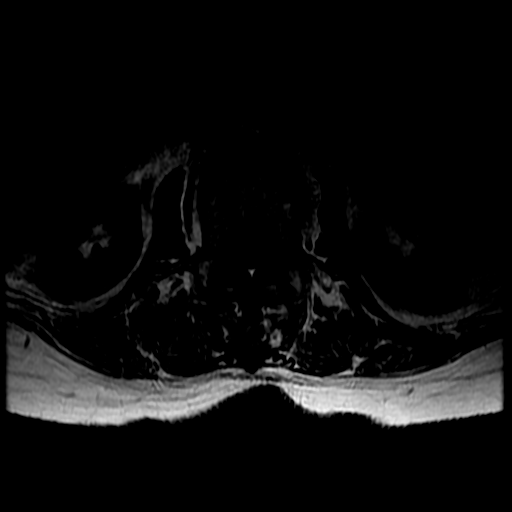

[17 of 48 positions shown; findings below may reference images not displayed]

FINDINGS: Segmentation: Normal segmentation. Lowest well-formed disc is
presumed to be the L5-S1 level.

Alignment: Trace anterolisthesis of L3 on L4, L4 on L5, and L5 on
S1, stable. Vertebral bodies otherwise normally aligned with
preservation of the normal lumbar lordosis.

Vertebrae: Vertebral body heights maintained. No acute or chronic
fracture. Signal intensity within the vertebral body bone marrow
somewhat heterogeneous without worrisome osseous lesion. Few
scattered benign hemangiomas noted. No marrow edema.

Conus medullaris: Extends to the L1 level and appears normal.

Paraspinal and other soft tissues: Paraspinous soft tissues
demonstrate no acute abnormality. Mild fatty atrophy noted within
the paraspinous musculature.

Disc levels:

T11-12: Seen only on sagittal projection. Disc desiccation with mild
diffuse disc bulge. No stenosis.

T12-L1: Seen only on sagittal projection. Disc desiccation with mild
diffuse disc bulge without focal disc herniation. No significant
stenosis.

L1-2: Mild diffuse disc bulge with disc desiccation and
intervertebral disc space narrowing. Mild bile facet arthrosis with
ligamentum flavum hypertrophy. Mild canal stenosis is stable from
prior. No significant foraminal encroachment.

L2-3: Mild diffuse disc bulge with disc desiccation intervertebral
disc space narrowing, slightly progressed from previous. Bilateral
facet arthrosis ligamentum flavum hypertrophy, also progressed.
Resultant mild canal stenosis is slightly worsened. No significant
foraminal encroachment.

L3-4: Trace anterolisthesis of L3 and L4. Resultant mild diffuse
disc bulge with disc desiccation. Bilateral facet arthrosis with
ligamentum flavum hypertrophy, slightly progressed. Resultant mild
canal and lateral recess stenosis, slightly progressed. Mild
bilateral foraminal stenosis out frank nerve root impingement.

L4-5: Trace anterolisthesis of L4 on L5. Broad-based shallow central
disc protrusion indents upon the ventral thecal sac. Progressive
facet and ligamentum flavum hypertrophy. Slightly worsened lateral
recess stenosis with mild to moderate canal narrowing. Mild
bilateral foraminal stenosis, left greater than right is similar.

L5-S1: Trace anterolisthesis of L5 on S1. Shallow central disc
protrusion indents upon the ventral thecal sac without significant
stenosis. No neural impingement. Moderate bilateral facet arthrosis,
similar to previous. Foramina remain widely patent.
IMPRESSION: 1. No acute abnormality within the lumbar spine.
2. Broad base disc protrusion at L4-5 with superimposed facet
disease, resulting in mild to moderate canal and lateral recess
stenosis, mildly worsened from previous.
3. Additional more mild multilevel degenerative spondylolysis and
facet disease as detailed above, overall slightly progressed
relative to most recent MRI from 12/10/2008. No high-grade spinal
stenosis or evidence of nerve root compression.

## 2017-04-29 ENCOUNTER — Ambulatory Visit: Payer: Medicare Other | Admitting: Psychology

## 2017-04-30 ENCOUNTER — Ambulatory Visit: Payer: Self-pay | Admitting: Psychology

## 2017-05-01 ENCOUNTER — Ambulatory Visit (INDEPENDENT_AMBULATORY_CARE_PROVIDER_SITE_OTHER): Payer: Medicare Other | Admitting: Psychology

## 2017-05-01 DIAGNOSIS — F331 Major depressive disorder, recurrent, moderate: Secondary | ICD-10-CM

## 2017-05-07 ENCOUNTER — Ambulatory Visit (INDEPENDENT_AMBULATORY_CARE_PROVIDER_SITE_OTHER): Payer: Medicare Other | Admitting: Psychology

## 2017-05-07 ENCOUNTER — Ambulatory Visit: Payer: Self-pay | Admitting: Psychology

## 2017-05-07 DIAGNOSIS — F331 Major depressive disorder, recurrent, moderate: Secondary | ICD-10-CM | POA: Diagnosis not present

## 2017-05-13 ENCOUNTER — Ambulatory Visit (INDEPENDENT_AMBULATORY_CARE_PROVIDER_SITE_OTHER): Payer: Medicare Other | Admitting: Psychology

## 2017-05-13 DIAGNOSIS — F331 Major depressive disorder, recurrent, moderate: Secondary | ICD-10-CM

## 2017-05-14 ENCOUNTER — Ambulatory Visit: Payer: Self-pay | Admitting: Psychology

## 2017-05-21 ENCOUNTER — Ambulatory Visit (INDEPENDENT_AMBULATORY_CARE_PROVIDER_SITE_OTHER): Payer: Medicare Other | Admitting: Psychology

## 2017-05-21 ENCOUNTER — Ambulatory Visit: Payer: Self-pay | Admitting: Psychology

## 2017-05-21 DIAGNOSIS — F331 Major depressive disorder, recurrent, moderate: Secondary | ICD-10-CM

## 2017-05-27 ENCOUNTER — Ambulatory Visit: Payer: Medicare Other | Admitting: Psychology

## 2017-05-27 DIAGNOSIS — F331 Major depressive disorder, recurrent, moderate: Secondary | ICD-10-CM | POA: Diagnosis not present

## 2017-05-28 ENCOUNTER — Ambulatory Visit: Payer: Self-pay | Admitting: Psychology

## 2017-06-04 ENCOUNTER — Ambulatory Visit: Payer: Self-pay | Admitting: Psychology

## 2017-06-04 ENCOUNTER — Ambulatory Visit: Payer: Medicare Other | Admitting: Psychology

## 2017-06-04 DIAGNOSIS — F331 Major depressive disorder, recurrent, moderate: Secondary | ICD-10-CM | POA: Diagnosis not present

## 2017-06-10 ENCOUNTER — Ambulatory Visit: Payer: Medicare Other | Admitting: Psychology

## 2017-06-18 ENCOUNTER — Ambulatory Visit (INDEPENDENT_AMBULATORY_CARE_PROVIDER_SITE_OTHER): Payer: Medicare Other | Admitting: Psychology

## 2017-06-18 DIAGNOSIS — F331 Major depressive disorder, recurrent, moderate: Secondary | ICD-10-CM | POA: Diagnosis not present

## 2017-06-24 ENCOUNTER — Ambulatory Visit (INDEPENDENT_AMBULATORY_CARE_PROVIDER_SITE_OTHER): Payer: Medicare Other | Admitting: Psychology

## 2017-06-24 DIAGNOSIS — F331 Major depressive disorder, recurrent, moderate: Secondary | ICD-10-CM

## 2017-07-02 ENCOUNTER — Ambulatory Visit (INDEPENDENT_AMBULATORY_CARE_PROVIDER_SITE_OTHER): Payer: Medicare Other | Admitting: Psychology

## 2017-07-02 DIAGNOSIS — F331 Major depressive disorder, recurrent, moderate: Secondary | ICD-10-CM | POA: Diagnosis not present

## 2017-07-08 ENCOUNTER — Ambulatory Visit (INDEPENDENT_AMBULATORY_CARE_PROVIDER_SITE_OTHER): Payer: Medicare Other | Admitting: Psychology

## 2017-07-08 DIAGNOSIS — F331 Major depressive disorder, recurrent, moderate: Secondary | ICD-10-CM

## 2017-07-22 ENCOUNTER — Ambulatory Visit: Payer: Self-pay | Admitting: Psychology

## 2017-07-23 ENCOUNTER — Ambulatory Visit (INDEPENDENT_AMBULATORY_CARE_PROVIDER_SITE_OTHER): Payer: Medicare Other | Admitting: Psychology

## 2017-07-23 DIAGNOSIS — F331 Major depressive disorder, recurrent, moderate: Secondary | ICD-10-CM

## 2017-07-30 ENCOUNTER — Ambulatory Visit (INDEPENDENT_AMBULATORY_CARE_PROVIDER_SITE_OTHER): Payer: Medicare Other | Admitting: Psychology

## 2017-07-30 DIAGNOSIS — F331 Major depressive disorder, recurrent, moderate: Secondary | ICD-10-CM | POA: Diagnosis not present

## 2017-08-05 ENCOUNTER — Ambulatory Visit (INDEPENDENT_AMBULATORY_CARE_PROVIDER_SITE_OTHER): Payer: Medicare Other | Admitting: Psychology

## 2017-08-05 DIAGNOSIS — F331 Major depressive disorder, recurrent, moderate: Secondary | ICD-10-CM

## 2017-08-13 ENCOUNTER — Ambulatory Visit (INDEPENDENT_AMBULATORY_CARE_PROVIDER_SITE_OTHER): Payer: Medicare Other | Admitting: Psychology

## 2017-08-13 DIAGNOSIS — F331 Major depressive disorder, recurrent, moderate: Secondary | ICD-10-CM

## 2017-08-19 ENCOUNTER — Ambulatory Visit (INDEPENDENT_AMBULATORY_CARE_PROVIDER_SITE_OTHER): Payer: Medicare Other | Admitting: Psychology

## 2017-08-19 DIAGNOSIS — F331 Major depressive disorder, recurrent, moderate: Secondary | ICD-10-CM

## 2017-08-27 ENCOUNTER — Ambulatory Visit (INDEPENDENT_AMBULATORY_CARE_PROVIDER_SITE_OTHER): Payer: Medicare Other | Admitting: Psychology

## 2017-08-27 DIAGNOSIS — F331 Major depressive disorder, recurrent, moderate: Secondary | ICD-10-CM

## 2017-09-02 ENCOUNTER — Ambulatory Visit (INDEPENDENT_AMBULATORY_CARE_PROVIDER_SITE_OTHER): Payer: Medicare Other | Admitting: Psychology

## 2017-09-02 DIAGNOSIS — F331 Major depressive disorder, recurrent, moderate: Secondary | ICD-10-CM | POA: Diagnosis not present

## 2017-09-09 ENCOUNTER — Ambulatory Visit (INDEPENDENT_AMBULATORY_CARE_PROVIDER_SITE_OTHER): Payer: Medicare Other | Admitting: Psychology

## 2017-09-09 DIAGNOSIS — F331 Major depressive disorder, recurrent, moderate: Secondary | ICD-10-CM | POA: Diagnosis not present

## 2017-09-16 ENCOUNTER — Ambulatory Visit: Payer: Self-pay | Admitting: Psychology

## 2017-09-23 ENCOUNTER — Ambulatory Visit (INDEPENDENT_AMBULATORY_CARE_PROVIDER_SITE_OTHER): Payer: Medicare Other | Admitting: Psychology

## 2017-09-23 DIAGNOSIS — F331 Major depressive disorder, recurrent, moderate: Secondary | ICD-10-CM

## 2017-09-29 ENCOUNTER — Ambulatory Visit: Payer: Self-pay | Admitting: Family Medicine

## 2017-09-30 ENCOUNTER — Ambulatory Visit (INDEPENDENT_AMBULATORY_CARE_PROVIDER_SITE_OTHER): Payer: Medicare Other | Admitting: Psychology

## 2017-09-30 DIAGNOSIS — F331 Major depressive disorder, recurrent, moderate: Secondary | ICD-10-CM

## 2017-10-08 ENCOUNTER — Ambulatory Visit (INDEPENDENT_AMBULATORY_CARE_PROVIDER_SITE_OTHER): Payer: Medicare Other | Admitting: Psychology

## 2017-10-08 DIAGNOSIS — F331 Major depressive disorder, recurrent, moderate: Secondary | ICD-10-CM | POA: Diagnosis not present

## 2017-10-14 ENCOUNTER — Ambulatory Visit (INDEPENDENT_AMBULATORY_CARE_PROVIDER_SITE_OTHER): Payer: Medicare Other | Admitting: Psychology

## 2017-10-14 DIAGNOSIS — F331 Major depressive disorder, recurrent, moderate: Secondary | ICD-10-CM

## 2017-10-22 ENCOUNTER — Ambulatory Visit (INDEPENDENT_AMBULATORY_CARE_PROVIDER_SITE_OTHER): Payer: Medicare Other | Admitting: Psychology

## 2017-10-22 DIAGNOSIS — F331 Major depressive disorder, recurrent, moderate: Secondary | ICD-10-CM | POA: Diagnosis not present

## 2017-10-28 ENCOUNTER — Ambulatory Visit (INDEPENDENT_AMBULATORY_CARE_PROVIDER_SITE_OTHER): Payer: Medicare Other | Admitting: Psychology

## 2017-10-28 DIAGNOSIS — F331 Major depressive disorder, recurrent, moderate: Secondary | ICD-10-CM | POA: Diagnosis not present

## 2017-11-05 ENCOUNTER — Ambulatory Visit (INDEPENDENT_AMBULATORY_CARE_PROVIDER_SITE_OTHER): Payer: Medicare Other | Admitting: Psychology

## 2017-11-05 DIAGNOSIS — F331 Major depressive disorder, recurrent, moderate: Secondary | ICD-10-CM

## 2017-11-11 ENCOUNTER — Ambulatory Visit (INDEPENDENT_AMBULATORY_CARE_PROVIDER_SITE_OTHER): Payer: Medicare Other | Admitting: Psychology

## 2017-11-11 DIAGNOSIS — F331 Major depressive disorder, recurrent, moderate: Secondary | ICD-10-CM | POA: Diagnosis not present

## 2017-11-19 ENCOUNTER — Ambulatory Visit (INDEPENDENT_AMBULATORY_CARE_PROVIDER_SITE_OTHER): Payer: Medicare Other | Admitting: Psychology

## 2017-11-19 DIAGNOSIS — F331 Major depressive disorder, recurrent, moderate: Secondary | ICD-10-CM | POA: Diagnosis not present

## 2017-11-25 ENCOUNTER — Ambulatory Visit: Payer: Self-pay | Admitting: Psychology

## 2017-12-03 ENCOUNTER — Ambulatory Visit (INDEPENDENT_AMBULATORY_CARE_PROVIDER_SITE_OTHER): Payer: Medicare Other | Admitting: Psychology

## 2017-12-03 DIAGNOSIS — F331 Major depressive disorder, recurrent, moderate: Secondary | ICD-10-CM | POA: Diagnosis not present

## 2017-12-09 ENCOUNTER — Ambulatory Visit (INDEPENDENT_AMBULATORY_CARE_PROVIDER_SITE_OTHER): Payer: Medicare Other | Admitting: Psychology

## 2017-12-09 DIAGNOSIS — F331 Major depressive disorder, recurrent, moderate: Secondary | ICD-10-CM | POA: Diagnosis not present

## 2017-12-17 ENCOUNTER — Ambulatory Visit (INDEPENDENT_AMBULATORY_CARE_PROVIDER_SITE_OTHER): Payer: Medicare Other | Admitting: Psychology

## 2017-12-17 DIAGNOSIS — F331 Major depressive disorder, recurrent, moderate: Secondary | ICD-10-CM

## 2017-12-24 ENCOUNTER — Ambulatory Visit: Payer: Medicare Other | Admitting: Psychology

## 2017-12-31 ENCOUNTER — Ambulatory Visit: Payer: Medicare Other | Admitting: Psychology

## 2017-12-31 ENCOUNTER — Ambulatory Visit (INDEPENDENT_AMBULATORY_CARE_PROVIDER_SITE_OTHER): Payer: Medicare Other | Admitting: Psychology

## 2017-12-31 DIAGNOSIS — F331 Major depressive disorder, recurrent, moderate: Secondary | ICD-10-CM | POA: Diagnosis not present

## 2018-01-06 ENCOUNTER — Ambulatory Visit: Payer: Medicare Other | Admitting: Psychology

## 2018-01-14 ENCOUNTER — Ambulatory Visit (INDEPENDENT_AMBULATORY_CARE_PROVIDER_SITE_OTHER): Payer: Medicare Other | Admitting: Psychology

## 2018-01-14 DIAGNOSIS — F331 Major depressive disorder, recurrent, moderate: Secondary | ICD-10-CM

## 2018-01-20 ENCOUNTER — Ambulatory Visit (INDEPENDENT_AMBULATORY_CARE_PROVIDER_SITE_OTHER): Payer: Medicare Other | Admitting: Psychology

## 2018-01-20 DIAGNOSIS — F331 Major depressive disorder, recurrent, moderate: Secondary | ICD-10-CM

## 2018-01-28 ENCOUNTER — Ambulatory Visit (INDEPENDENT_AMBULATORY_CARE_PROVIDER_SITE_OTHER): Payer: Medicare Other | Admitting: Psychology

## 2018-01-28 DIAGNOSIS — F331 Major depressive disorder, recurrent, moderate: Secondary | ICD-10-CM | POA: Diagnosis not present

## 2018-02-03 ENCOUNTER — Ambulatory Visit (INDEPENDENT_AMBULATORY_CARE_PROVIDER_SITE_OTHER): Payer: Medicare Other | Admitting: Psychology

## 2018-02-03 DIAGNOSIS — F331 Major depressive disorder, recurrent, moderate: Secondary | ICD-10-CM

## 2018-02-11 ENCOUNTER — Ambulatory Visit (INDEPENDENT_AMBULATORY_CARE_PROVIDER_SITE_OTHER): Payer: Medicare Other | Admitting: Psychology

## 2018-02-11 DIAGNOSIS — F331 Major depressive disorder, recurrent, moderate: Secondary | ICD-10-CM | POA: Diagnosis not present

## 2018-02-17 ENCOUNTER — Ambulatory Visit (INDEPENDENT_AMBULATORY_CARE_PROVIDER_SITE_OTHER): Payer: Medicare Other | Admitting: Psychology

## 2018-02-17 DIAGNOSIS — F331 Major depressive disorder, recurrent, moderate: Secondary | ICD-10-CM | POA: Diagnosis not present

## 2018-02-25 ENCOUNTER — Ambulatory Visit (INDEPENDENT_AMBULATORY_CARE_PROVIDER_SITE_OTHER): Payer: Medicare Other | Admitting: Psychology

## 2018-02-25 DIAGNOSIS — F331 Major depressive disorder, recurrent, moderate: Secondary | ICD-10-CM | POA: Diagnosis not present

## 2018-03-03 ENCOUNTER — Ambulatory Visit (INDEPENDENT_AMBULATORY_CARE_PROVIDER_SITE_OTHER): Payer: Medicare Other | Admitting: Psychology

## 2018-03-03 DIAGNOSIS — F331 Major depressive disorder, recurrent, moderate: Secondary | ICD-10-CM | POA: Diagnosis not present

## 2018-03-11 ENCOUNTER — Ambulatory Visit: Payer: Medicare Other | Admitting: Psychology

## 2018-03-17 ENCOUNTER — Ambulatory Visit (INDEPENDENT_AMBULATORY_CARE_PROVIDER_SITE_OTHER): Payer: Medicare Other | Admitting: Psychology

## 2018-03-17 DIAGNOSIS — F331 Major depressive disorder, recurrent, moderate: Secondary | ICD-10-CM | POA: Diagnosis not present

## 2018-03-25 ENCOUNTER — Ambulatory Visit (INDEPENDENT_AMBULATORY_CARE_PROVIDER_SITE_OTHER): Payer: Medicare Other | Admitting: Psychology

## 2018-03-25 DIAGNOSIS — F331 Major depressive disorder, recurrent, moderate: Secondary | ICD-10-CM | POA: Diagnosis not present

## 2018-04-14 ENCOUNTER — Ambulatory Visit (INDEPENDENT_AMBULATORY_CARE_PROVIDER_SITE_OTHER): Payer: Medicare Other | Admitting: Psychology

## 2018-04-14 DIAGNOSIS — F331 Major depressive disorder, recurrent, moderate: Secondary | ICD-10-CM

## 2018-04-22 ENCOUNTER — Ambulatory Visit (INDEPENDENT_AMBULATORY_CARE_PROVIDER_SITE_OTHER): Payer: Medicare Other | Admitting: Psychology

## 2018-04-22 DIAGNOSIS — F331 Major depressive disorder, recurrent, moderate: Secondary | ICD-10-CM | POA: Diagnosis not present

## 2018-04-28 ENCOUNTER — Ambulatory Visit (INDEPENDENT_AMBULATORY_CARE_PROVIDER_SITE_OTHER): Payer: Medicare Other | Admitting: Psychology

## 2018-04-28 DIAGNOSIS — F331 Major depressive disorder, recurrent, moderate: Secondary | ICD-10-CM

## 2018-05-06 ENCOUNTER — Ambulatory Visit (INDEPENDENT_AMBULATORY_CARE_PROVIDER_SITE_OTHER): Payer: Medicare Other | Admitting: Psychology

## 2018-05-06 DIAGNOSIS — F331 Major depressive disorder, recurrent, moderate: Secondary | ICD-10-CM

## 2018-05-12 ENCOUNTER — Ambulatory Visit (INDEPENDENT_AMBULATORY_CARE_PROVIDER_SITE_OTHER): Payer: Medicare Other | Admitting: Psychology

## 2018-05-12 DIAGNOSIS — F331 Major depressive disorder, recurrent, moderate: Secondary | ICD-10-CM | POA: Diagnosis not present

## 2018-05-17 ENCOUNTER — Ambulatory Visit: Payer: Medicare Other | Admitting: Psychology

## 2018-05-20 ENCOUNTER — Ambulatory Visit (INDEPENDENT_AMBULATORY_CARE_PROVIDER_SITE_OTHER): Payer: Medicare Other | Admitting: Psychology

## 2018-05-20 DIAGNOSIS — F331 Major depressive disorder, recurrent, moderate: Secondary | ICD-10-CM

## 2018-05-26 ENCOUNTER — Ambulatory Visit: Payer: Medicare Other | Admitting: Psychology

## 2018-05-26 ENCOUNTER — Ambulatory Visit (INDEPENDENT_AMBULATORY_CARE_PROVIDER_SITE_OTHER): Payer: Medicare Other | Admitting: Psychology

## 2018-05-26 DIAGNOSIS — F331 Major depressive disorder, recurrent, moderate: Secondary | ICD-10-CM | POA: Diagnosis not present

## 2018-05-31 ENCOUNTER — Ambulatory Visit: Payer: Medicare Other | Admitting: Psychology

## 2018-06-03 ENCOUNTER — Ambulatory Visit (INDEPENDENT_AMBULATORY_CARE_PROVIDER_SITE_OTHER): Payer: Medicare Other | Admitting: Psychology

## 2018-06-03 DIAGNOSIS — F331 Major depressive disorder, recurrent, moderate: Secondary | ICD-10-CM | POA: Diagnosis not present

## 2018-06-09 ENCOUNTER — Ambulatory Visit (INDEPENDENT_AMBULATORY_CARE_PROVIDER_SITE_OTHER): Payer: Medicare Other | Admitting: Psychology

## 2018-06-09 DIAGNOSIS — F331 Major depressive disorder, recurrent, moderate: Secondary | ICD-10-CM

## 2018-06-14 ENCOUNTER — Ambulatory Visit (INDEPENDENT_AMBULATORY_CARE_PROVIDER_SITE_OTHER): Payer: Medicare Other | Admitting: Psychology

## 2018-06-14 ENCOUNTER — Ambulatory Visit: Payer: Medicare Other | Admitting: Psychology

## 2018-06-14 DIAGNOSIS — F331 Major depressive disorder, recurrent, moderate: Secondary | ICD-10-CM

## 2018-06-23 ENCOUNTER — Ambulatory Visit (INDEPENDENT_AMBULATORY_CARE_PROVIDER_SITE_OTHER): Payer: Medicare Other | Admitting: Psychology

## 2018-06-23 DIAGNOSIS — F331 Major depressive disorder, recurrent, moderate: Secondary | ICD-10-CM | POA: Diagnosis not present

## 2018-07-01 ENCOUNTER — Ambulatory Visit (INDEPENDENT_AMBULATORY_CARE_PROVIDER_SITE_OTHER): Payer: Medicare Other | Admitting: Psychology

## 2018-07-01 DIAGNOSIS — F331 Major depressive disorder, recurrent, moderate: Secondary | ICD-10-CM

## 2018-07-07 ENCOUNTER — Ambulatory Visit (INDEPENDENT_AMBULATORY_CARE_PROVIDER_SITE_OTHER): Payer: Medicare Other | Admitting: Psychology

## 2018-07-07 DIAGNOSIS — F331 Major depressive disorder, recurrent, moderate: Secondary | ICD-10-CM

## 2018-07-15 ENCOUNTER — Ambulatory Visit: Payer: Medicare Other | Admitting: Psychology

## 2018-07-19 ENCOUNTER — Ambulatory Visit (INDEPENDENT_AMBULATORY_CARE_PROVIDER_SITE_OTHER): Payer: Medicare Other | Admitting: Psychology

## 2018-07-19 DIAGNOSIS — F331 Major depressive disorder, recurrent, moderate: Secondary | ICD-10-CM | POA: Diagnosis not present

## 2018-07-21 DIAGNOSIS — B029 Zoster without complications: Secondary | ICD-10-CM

## 2018-07-21 HISTORY — DX: Zoster without complications: B02.9

## 2018-07-29 ENCOUNTER — Ambulatory Visit (INDEPENDENT_AMBULATORY_CARE_PROVIDER_SITE_OTHER): Payer: Medicare HMO | Admitting: Psychology

## 2018-07-29 DIAGNOSIS — F331 Major depressive disorder, recurrent, moderate: Secondary | ICD-10-CM | POA: Diagnosis not present

## 2018-07-29 DIAGNOSIS — R69 Illness, unspecified: Secondary | ICD-10-CM | POA: Diagnosis not present

## 2018-07-31 DIAGNOSIS — M5431 Sciatica, right side: Secondary | ICD-10-CM | POA: Diagnosis not present

## 2018-07-31 DIAGNOSIS — G5701 Lesion of sciatic nerve, right lower limb: Secondary | ICD-10-CM | POA: Diagnosis not present

## 2018-08-03 DIAGNOSIS — B029 Zoster without complications: Secondary | ICD-10-CM | POA: Diagnosis not present

## 2018-08-04 ENCOUNTER — Ambulatory Visit: Payer: Medicare HMO | Admitting: Psychology

## 2018-08-04 DIAGNOSIS — F331 Major depressive disorder, recurrent, moderate: Secondary | ICD-10-CM

## 2018-08-04 DIAGNOSIS — R69 Illness, unspecified: Secondary | ICD-10-CM | POA: Diagnosis not present

## 2018-08-18 ENCOUNTER — Ambulatory Visit (INDEPENDENT_AMBULATORY_CARE_PROVIDER_SITE_OTHER): Payer: Medicare HMO | Admitting: Psychology

## 2018-08-18 DIAGNOSIS — F331 Major depressive disorder, recurrent, moderate: Secondary | ICD-10-CM | POA: Diagnosis not present

## 2018-08-18 DIAGNOSIS — R69 Illness, unspecified: Secondary | ICD-10-CM | POA: Diagnosis not present

## 2018-08-24 ENCOUNTER — Other Ambulatory Visit: Payer: Self-pay | Admitting: *Deleted

## 2018-08-24 ENCOUNTER — Encounter: Payer: Self-pay | Admitting: *Deleted

## 2018-08-25 ENCOUNTER — Encounter: Payer: Self-pay | Admitting: Diagnostic Neuroimaging

## 2018-08-25 ENCOUNTER — Ambulatory Visit: Payer: Medicare HMO | Admitting: Diagnostic Neuroimaging

## 2018-08-25 VITALS — BP 121/68 | HR 61 | Ht 63.0 in | Wt 134.8 lb

## 2018-08-25 DIAGNOSIS — B0229 Other postherpetic nervous system involvement: Secondary | ICD-10-CM | POA: Diagnosis not present

## 2018-08-25 NOTE — Patient Instructions (Signed)
POST-HERPETIC NEURALGIA - increase gabapentin to 300mg  twice a day - consider lidocaine cream

## 2018-08-25 NOTE — Progress Notes (Signed)
[facility]  PATIENT: [name] DOB: 12/19/1922  REFERRING CLINICIAN: A Brake HISTORY FROM: patient  REASON FOR VISIT: new consult    HISTORICAL  CHIEF COMPLAINT:  Chief Complaint  Patient presents with  . Post herpetic neuralgia    rm 6, New Pt, caregiver- Thayer Headings, " in Jan I had shingles on right leg from knee down right foot/ankle, they feel asleep, get painful, tight"    HISTORY OF PRESENT ILLNESS:   85 year old female here for evaluation of postherpetic neuralgia.  5 weeks ago patient had onset of rash on her right leg from her knee down to her foot.  She was diagnosed with shingles and treated with, on gabapentin.  Patient continues to have numbness and tingling in that area.  This affects her mainly at nighttime.  She was started on gabapentin 300 milligrams 3 times a day but this caused side effects of been patient requested to taper off.  She has been on 300 mg daily for the past 3 days.  Side effects have resolved.    REVIEW OF SYSTEMS: Full 14 system review of systems performed and negative with exception of: Depression anxiety memory loss numbness dizziness swelling in legs fatigue trouble swallowing diarrhea constipation joint pain.   ALLERGIES: Allergies  Allergen Reactions  . Cartia Xt [Diltiazem] Other (See Comments)    BRADYCARDIA IN 40'S  . Codeine Other (See Comments)    syncope  . Lisinopril-Hydrochlorothiazide Cough  . Mercury Rash  . Sulfonamide Derivatives Rash    swelling    HOME MEDICATIONS: Outpatient Medications Prior to Visit  Medication Sig Dispense Refill  . acetaminophen (TYLENOL) 325 MG tablet Take 650 mg by mouth every 4 (four) hours as needed for mild pain, moderate pain, fever or headache.     Marland Kitchen amLODipine (NORVASC) 5 MG tablet Take 1 tablet (5 mg total) by mouth daily. 30 tablet 0  . bisacodyl (DULCOLAX) 5 MG EC tablet Take 1 tablet (5 mg total) by mouth daily as needed for moderate constipation. 30 tablet 0    . clonazePAM (KLONOPIN) 1 MG tablet Take 0.5 mg by mouth 3 (three) times daily as needed for anxiety.     Marland Kitchen escitalopram (LEXAPRO) 5 MG tablet TAKE 1 TABLET BY MOUTH TWICE A DAY **D/C 10MG  TABS**    . feeding supplement, ENSURE ENLIVE, (ENSURE ENLIVE) LIQD Take 237 mLs by mouth 2 (two) times daily between meals. Give 2 week supply 237 mL 0  . fluticasone (FLONASE) 50 MCG/ACT nasal spray Place 2 sprays into both nostrils daily as needed for allergies.     . mirtazapine (REMERON) 30 MG tablet Take 1 tablet (30 mg total) by mouth at bedtime. 30 tablet 2  . Multiple Vitamin (MULTIVITAMIN) tablet Take 1 tablet by mouth daily.      . Multiple Vitamins-Minerals (PRESERVISION AREDS 2 PO) Take 2 tablets by mouth daily.    . Omega-3 Fatty Acids (FISH OIL) 1000 MG CPDR Take by mouth.    Marland Kitchen omeprazole (PRILOSEC) 20 MG capsule Take 1 capsule (20 mg total) by mouth daily. (Patient taking differently: Take 20 mg by mouth daily as needed (for heartburn). ) 90 capsule 0  . polyethylene glycol (MIRALAX / GLYCOLAX) packet Take 17 g by mouth 2 (two) times daily. 14 each 0  . simvastatin (ZOCOR) 20 MG tablet TAKE 1 TABLET EVERY DAY (Patient taking differently: TAKE 20 MG BY MOUTH EVERY EVENING) 90 tablet 0  . aspirin EC 81 MG tablet Take 81 mg  by mouth daily.    . cholecalciferol (VITAMIN D) 1000 UNITS tablet Take 2,000 Units by mouth daily.     Marland Kitchen docusate sodium (COLACE) 100 MG capsule Take 2 capsules (200 mg total) by mouth 2 (two) times daily as needed for mild constipation. (Patient not taking: Reported on 08/25/2018) 30 capsule 0  . ondansetron (ZOFRAN) 4 MG tablet Take 2-4 mg by mouth every 8 (eight) hours as needed for nausea or vomiting.    . predniSONE (DELTASONE) 20 MG tablet Take 1 tablet (20 mg total) by mouth daily with breakfast. (Patient not taking: Reported on 08/25/2018) 30 tablet 0  . sertraline (ZOLOFT) 25 MG tablet Take 75 mg by mouth daily.     . traMADol (ULTRAM) 50 MG tablet Take 50 mg by mouth 2  (two) times daily.    Marland Kitchen triamcinolone cream (KENALOG) 0.1 % Place 1 application vaginally 2 (two) times daily as needed (for itching).     No facility-administered medications prior to visit.     PAST MEDICAL HISTORY: Past Medical History:  Diagnosis Date  . Allergy   . Arthritis   . Depression   . Endometriosis   . GERD (gastroesophageal reflux disease)   . Hearing loss 12/08/2011   bil, hearing aids  . Hyperlipidemia   . Hypertension   . Osteoporosis   . Ovarian cyst   . Polymyalgia rheumatica ([place])   . Post herpetic neuralgia   . S/P right THA, AA 12/16/2011  . Shingles 2020  . Skin cancer   . Spinal stenosis     PAST SURGICAL HISTORY: Past Surgical History:  Procedure Laterality Date  . CATARACT EXTRACTION, BILATERAL  12-08-11   bil  . DILATION AND CURETTAGE OF UTERUS    . HERNIA REPAIR    . HYSTEROSCOPY    . OOPHORECTOMY     bilateral  . STERIOD INJECTION  12/09/11   to spine for rt calf pain and spasms  . TIBIA FRACTURE SURGERY     trauma  . TONSILLECTOMY AND ADENOIDECTOMY    . TOTAL HIP ARTHROPLASTY  12/16/2011   Procedure: TOTAL HIP ARTHROPLASTY ANTERIOR APPROACH;  Surgeon: Mauri Pole, MD;  Location: [facility];  Service: Orthopedics;  Laterality: Right;    FAMILY HISTORY: Family History  Problem Relation Age of Onset  . Stroke Sister   . Diabetes Daughter   . Cancer Father        esophogeal  . Cancer Paternal Aunt        stomach  . Breast cancer Maternal Grandmother        Age 69  . Stroke Mother     SOCIAL HISTORY: Social History   Socioeconomic History  . Marital status: Widowed    Spouse name: Not on file  . Number of children: 4  . Years of education: Not on file  . Highest education level: Master's degree (e.g., MA, MS, MEng, MEd, MSW, MBA)  Occupational History    Comment: retired  Scientific laboratory technician  . Financial resource strain: Not on file  . Food insecurity:    Worry: Not on file    Inability: Not on file  . Transportation needs:     Medical: Not on file    Non-medical: Not on file  Tobacco Use  . Smoking status: Former Research scientist (life sciences)  . Smokeless tobacco: Never Used  Substance and Sexual Activity  . Alcohol use: No    Alcohol/week: 1.0 standard drinks    Types: 1 Standard drinks or equivalent per  week  . Drug use: No  . Sexual activity: Never  Lifestyle  . Physical activity:    Days per week: Not on file    Minutes per session: Not on file  . Stress: Not on file  Relationships  . Social connections:    Talks on phone: Not on file    Gets together: Not on file    Attends religious service: Not on file    Active member of club or organization: Not on file    Attends meetings of clubs or organizations: Not on file    Relationship status: Not on file  . Intimate partner violence:    Fear of current or ex partner: Not on file    Emotionally abused: Not on file    Physically abused: Not on file    Forced sexual activity: Not on file  Other Topics Concern  . Not on file  Social History Narrative   08/25/18 Lives alone, caregiver, Thayer Headings 4 hours weekdays   Caffeine- tea, occas soda     PHYSICAL EXAM  GENERAL EXAM/CONSTITUTIONAL: Vitals:  Vitals:   08/25/18 1123  BP: 121/68  Pulse: 61  Weight: 134 lb 12.8 oz (61.1 kg)  Height: 5\' 3"  (1.6 m)     Body mass index is 23.88 kg/m. Wt Readings from Last 3 Encounters:  08/25/18 134 lb 12.8 oz (61.1 kg)  02/16/16 141 lb (64 kg)  01/30/16 142 lb 3.2 oz (64.5 kg)     Patient is in no distress; well developed, nourished and groomed; neck is supple  CARDIOVASCULAR:  Examination of carotid arteries is normal; no carotid bruits  Regular rate and rhythm, no murmurs  Examination of peripheral vascular system by observation and palpation is normal  EYES:  Ophthalmoscopic exam of optic discs and posterior segments is normal; no papilledema or hemorrhages  Visual Acuity Screening   Right eye Left eye Both eyes  Without correction:     With correction:  20/50    Comments: Macular degeneration, trifocals, unable to see w/right eye    MUSCULOSKELETAL:  Gait, strength, tone, movements noted in Neurologic exam below  NEUROLOGIC: MENTAL STATUS:  No flowsheet data found.  awake, alert, oriented to person, place and time  recent and remote memory intact  normal attention and concentration  language fluent, comprehension intact, naming intact  fund of knowledge appropriate  CRANIAL NERVE:   2nd - no papilledema on fundoscopic exam  2nd, 3rd, 4th, 6th - pupils equal and reactive to light, visual fields full to confrontation, extraocular muscles intact, no nystagmus  5th - facial sensation symmetric  7th - facial strength symmetric  8th - hearing intact  9th - palate elevates symmetrically, uvula midline  11th - shoulder shrug symmetric  12th - tongue protrusion midline  MOTOR:   normal bulk and tone, full strength in the BUE, BLE  SENSORY:   normal and symmetric to light touch, temperature, vibration  COORDINATION:   finger-nose-finger, fine finger movements normal  REFLEXES:   deep tendon reflexes present and symmetric  GAIT/STATION:   narrow based gait; USES WALKER     DIAGNOSTIC DATA (LABS, IMAGING, TESTING) - I reviewed patient records, labs, notes, testing and imaging myself where available.  Lab Results  Component Value Date   WBC 10.2 02/16/2016   HGB 10.1 (L) 02/16/2016   HCT 31.9 (L) 02/16/2016   MCV 93.8 02/16/2016   PLT 456 (H) 02/16/2016      Component Value Date/Time   NA 132 (L)  02/16/2016 0512   K 4.0 02/16/2016 0512   CL 96 (L) 02/16/2016 0512   CO2 28 02/16/2016 0512   GLUCOSE 116 (H) 02/16/2016 0512   BUN 16 02/16/2016 0512   CREATININE 0.63 02/16/2016 0512   CALCIUM 8.7 (L) 02/16/2016 0512   PROT 6.3 (L) 02/16/2016 0512   ALBUMIN 2.3 (L) 02/16/2016 0512   AST 27 02/16/2016 0512   ALT 29 02/16/2016 0512   ALKPHOS 300 (H) 02/16/2016 0512   BILITOT 0.4 02/16/2016 0512    GFRNONAA >60 02/16/2016 0512   GFRAA >60 02/16/2016 0512   Lab Results  Component Value Date   CHOL 176 03/25/2013   HDL 70.10 03/25/2013   LDLCALC 82 03/25/2013   TRIG 118.0 03/25/2013   CHOLHDL 3 03/25/2013   No results found for: HGBA1C No results found for: VITAMINB12 Lab Results  Component Value Date   TSH 1.503 01/29/2016       ASSESSMENT AND PLAN  85 y.o. year old female here with right leg shingles and postherpetic neuralgia since January 2020.  Dx:  1. Post herpetic neuralgia     PLAN:  POST-HERPETIC NEURALGIA - increase gabapentin to 300mg  twice a day (300mg  three times a day was too strong) - consider topical lidocaine cream - expect symptoms will gradually improve over time   Return for return to PCP.    Penni Bombard, MD 10/28/4494, 75:91 PM Certified in Neurology, Neurophysiology and Neuroimaging  [facility] [address], [place], [place] [address] [phone]

## 2018-08-26 ENCOUNTER — Ambulatory Visit: Payer: Medicare HMO | Admitting: Psychology

## 2018-08-26 DIAGNOSIS — F331 Major depressive disorder, recurrent, moderate: Secondary | ICD-10-CM

## 2018-09-01 ENCOUNTER — Ambulatory Visit: Payer: Medicare HMO | Admitting: Psychology

## 2018-09-01 DIAGNOSIS — F331 Major depressive disorder, recurrent, moderate: Secondary | ICD-10-CM | POA: Diagnosis not present

## 2018-09-09 ENCOUNTER — Ambulatory Visit (INDEPENDENT_AMBULATORY_CARE_PROVIDER_SITE_OTHER): Payer: Medicare HMO | Admitting: Psychology

## 2018-09-09 DIAGNOSIS — F331 Major depressive disorder, recurrent, moderate: Secondary | ICD-10-CM | POA: Diagnosis not present

## 2018-09-15 ENCOUNTER — Ambulatory Visit (INDEPENDENT_AMBULATORY_CARE_PROVIDER_SITE_OTHER): Payer: Medicare HMO | Admitting: Psychology

## 2018-09-15 DIAGNOSIS — F331 Major depressive disorder, recurrent, moderate: Secondary | ICD-10-CM | POA: Diagnosis not present

## 2018-09-29 ENCOUNTER — Ambulatory Visit: Payer: Medicare HMO | Admitting: Psychology

## 2018-09-29 DIAGNOSIS — F331 Major depressive disorder, recurrent, moderate: Secondary | ICD-10-CM | POA: Diagnosis not present

## 2018-10-13 ENCOUNTER — Ambulatory Visit (INDEPENDENT_AMBULATORY_CARE_PROVIDER_SITE_OTHER): Payer: Medicare HMO | Admitting: Psychology

## 2018-10-13 DIAGNOSIS — F331 Major depressive disorder, recurrent, moderate: Secondary | ICD-10-CM | POA: Diagnosis not present

## 2018-10-27 ENCOUNTER — Ambulatory Visit (INDEPENDENT_AMBULATORY_CARE_PROVIDER_SITE_OTHER): Payer: Medicare HMO | Admitting: Psychology

## 2018-10-27 DIAGNOSIS — F331 Major depressive disorder, recurrent, moderate: Secondary | ICD-10-CM

## 2018-11-01 ENCOUNTER — Ambulatory Visit (INDEPENDENT_AMBULATORY_CARE_PROVIDER_SITE_OTHER): Payer: Medicare HMO | Admitting: Psychology

## 2018-11-01 DIAGNOSIS — F331 Major depressive disorder, recurrent, moderate: Secondary | ICD-10-CM | POA: Diagnosis not present

## 2018-11-10 ENCOUNTER — Ambulatory Visit (INDEPENDENT_AMBULATORY_CARE_PROVIDER_SITE_OTHER): Payer: Medicare HMO | Admitting: Psychology

## 2018-11-10 DIAGNOSIS — F331 Major depressive disorder, recurrent, moderate: Secondary | ICD-10-CM

## 2018-11-17 ENCOUNTER — Ambulatory Visit (INDEPENDENT_AMBULATORY_CARE_PROVIDER_SITE_OTHER): Payer: Medicare HMO | Admitting: Psychology

## 2018-11-17 DIAGNOSIS — F331 Major depressive disorder, recurrent, moderate: Secondary | ICD-10-CM

## 2018-11-24 ENCOUNTER — Ambulatory Visit (INDEPENDENT_AMBULATORY_CARE_PROVIDER_SITE_OTHER): Payer: Medicare HMO | Admitting: Psychology

## 2018-11-24 DIAGNOSIS — F331 Major depressive disorder, recurrent, moderate: Secondary | ICD-10-CM | POA: Diagnosis not present

## 2018-11-30 ENCOUNTER — Ambulatory Visit (INDEPENDENT_AMBULATORY_CARE_PROVIDER_SITE_OTHER): Payer: Medicare HMO | Admitting: Psychology

## 2018-11-30 DIAGNOSIS — F331 Major depressive disorder, recurrent, moderate: Secondary | ICD-10-CM

## 2018-12-01 ENCOUNTER — Ambulatory Visit: Payer: Medicare HMO | Admitting: Psychology

## 2018-12-08 ENCOUNTER — Ambulatory Visit (INDEPENDENT_AMBULATORY_CARE_PROVIDER_SITE_OTHER): Payer: Medicare HMO | Admitting: Psychology

## 2018-12-08 DIAGNOSIS — F331 Major depressive disorder, recurrent, moderate: Secondary | ICD-10-CM

## 2018-12-14 ENCOUNTER — Ambulatory Visit (INDEPENDENT_AMBULATORY_CARE_PROVIDER_SITE_OTHER): Payer: Medicare HMO | Admitting: Psychology

## 2018-12-14 DIAGNOSIS — F331 Major depressive disorder, recurrent, moderate: Secondary | ICD-10-CM | POA: Diagnosis not present

## 2018-12-22 ENCOUNTER — Ambulatory Visit (INDEPENDENT_AMBULATORY_CARE_PROVIDER_SITE_OTHER): Payer: Medicare HMO | Admitting: Psychology

## 2018-12-22 DIAGNOSIS — F331 Major depressive disorder, recurrent, moderate: Secondary | ICD-10-CM | POA: Diagnosis not present

## 2018-12-28 ENCOUNTER — Ambulatory Visit (INDEPENDENT_AMBULATORY_CARE_PROVIDER_SITE_OTHER): Payer: Medicare HMO | Admitting: Psychology

## 2018-12-28 DIAGNOSIS — F331 Major depressive disorder, recurrent, moderate: Secondary | ICD-10-CM | POA: Diagnosis not present

## 2019-01-05 ENCOUNTER — Ambulatory Visit (INDEPENDENT_AMBULATORY_CARE_PROVIDER_SITE_OTHER): Payer: Medicare HMO | Admitting: Psychology

## 2019-01-05 DIAGNOSIS — F331 Major depressive disorder, recurrent, moderate: Secondary | ICD-10-CM | POA: Diagnosis not present

## 2019-01-11 ENCOUNTER — Ambulatory Visit (INDEPENDENT_AMBULATORY_CARE_PROVIDER_SITE_OTHER): Payer: Medicare HMO | Admitting: Psychology

## 2019-01-11 DIAGNOSIS — F331 Major depressive disorder, recurrent, moderate: Secondary | ICD-10-CM | POA: Diagnosis not present

## 2019-01-19 ENCOUNTER — Ambulatory Visit (INDEPENDENT_AMBULATORY_CARE_PROVIDER_SITE_OTHER): Payer: Medicare HMO | Admitting: Psychology

## 2019-01-19 DIAGNOSIS — F331 Major depressive disorder, recurrent, moderate: Secondary | ICD-10-CM | POA: Diagnosis not present

## 2019-01-25 ENCOUNTER — Ambulatory Visit (INDEPENDENT_AMBULATORY_CARE_PROVIDER_SITE_OTHER): Payer: Medicare HMO | Admitting: Psychology

## 2019-01-25 DIAGNOSIS — F331 Major depressive disorder, recurrent, moderate: Secondary | ICD-10-CM | POA: Diagnosis not present

## 2019-02-02 ENCOUNTER — Ambulatory Visit (INDEPENDENT_AMBULATORY_CARE_PROVIDER_SITE_OTHER): Payer: Medicare HMO | Admitting: Psychology

## 2019-02-02 DIAGNOSIS — F331 Major depressive disorder, recurrent, moderate: Secondary | ICD-10-CM

## 2019-02-08 ENCOUNTER — Ambulatory Visit (INDEPENDENT_AMBULATORY_CARE_PROVIDER_SITE_OTHER): Payer: Medicare HMO | Admitting: Psychology

## 2019-02-08 DIAGNOSIS — F331 Major depressive disorder, recurrent, moderate: Secondary | ICD-10-CM

## 2019-02-22 ENCOUNTER — Ambulatory Visit: Payer: Medicare HMO | Admitting: Psychology

## 2019-03-02 ENCOUNTER — Ambulatory Visit (INDEPENDENT_AMBULATORY_CARE_PROVIDER_SITE_OTHER): Payer: Medicare HMO | Admitting: Psychology

## 2019-03-02 DIAGNOSIS — F331 Major depressive disorder, recurrent, moderate: Secondary | ICD-10-CM | POA: Diagnosis not present

## 2019-03-08 ENCOUNTER — Ambulatory Visit (INDEPENDENT_AMBULATORY_CARE_PROVIDER_SITE_OTHER): Payer: Medicare HMO | Admitting: Psychology

## 2019-03-08 DIAGNOSIS — F331 Major depressive disorder, recurrent, moderate: Secondary | ICD-10-CM

## 2019-03-16 ENCOUNTER — Ambulatory Visit (INDEPENDENT_AMBULATORY_CARE_PROVIDER_SITE_OTHER): Payer: Medicare HMO | Admitting: Psychology

## 2019-03-16 DIAGNOSIS — F331 Major depressive disorder, recurrent, moderate: Secondary | ICD-10-CM | POA: Diagnosis not present

## 2019-03-22 ENCOUNTER — Ambulatory Visit (INDEPENDENT_AMBULATORY_CARE_PROVIDER_SITE_OTHER): Payer: Medicare HMO | Admitting: Psychology

## 2019-03-22 DIAGNOSIS — F331 Major depressive disorder, recurrent, moderate: Secondary | ICD-10-CM | POA: Diagnosis not present

## 2019-03-30 ENCOUNTER — Ambulatory Visit (INDEPENDENT_AMBULATORY_CARE_PROVIDER_SITE_OTHER): Payer: Medicare HMO | Admitting: Psychology

## 2019-03-30 DIAGNOSIS — F331 Major depressive disorder, recurrent, moderate: Secondary | ICD-10-CM

## 2019-04-05 ENCOUNTER — Ambulatory Visit (INDEPENDENT_AMBULATORY_CARE_PROVIDER_SITE_OTHER): Payer: Medicare HMO | Admitting: Psychology

## 2019-04-05 DIAGNOSIS — F331 Major depressive disorder, recurrent, moderate: Secondary | ICD-10-CM | POA: Diagnosis not present

## 2019-04-13 ENCOUNTER — Ambulatory Visit (INDEPENDENT_AMBULATORY_CARE_PROVIDER_SITE_OTHER): Payer: Medicare HMO | Admitting: Psychology

## 2019-04-13 DIAGNOSIS — F331 Major depressive disorder, recurrent, moderate: Secondary | ICD-10-CM | POA: Diagnosis not present

## 2019-04-19 ENCOUNTER — Ambulatory Visit (INDEPENDENT_AMBULATORY_CARE_PROVIDER_SITE_OTHER): Payer: Medicare HMO | Admitting: Psychology

## 2019-04-19 DIAGNOSIS — F331 Major depressive disorder, recurrent, moderate: Secondary | ICD-10-CM

## 2019-04-27 ENCOUNTER — Ambulatory Visit (INDEPENDENT_AMBULATORY_CARE_PROVIDER_SITE_OTHER): Payer: Medicare HMO | Admitting: Psychology

## 2019-04-27 DIAGNOSIS — F331 Major depressive disorder, recurrent, moderate: Secondary | ICD-10-CM | POA: Diagnosis not present

## 2019-05-03 ENCOUNTER — Ambulatory Visit (INDEPENDENT_AMBULATORY_CARE_PROVIDER_SITE_OTHER): Payer: Medicare HMO | Admitting: Psychology

## 2019-05-03 DIAGNOSIS — F331 Major depressive disorder, recurrent, moderate: Secondary | ICD-10-CM

## 2019-05-11 ENCOUNTER — Ambulatory Visit (INDEPENDENT_AMBULATORY_CARE_PROVIDER_SITE_OTHER): Payer: Medicare HMO | Admitting: Psychology

## 2019-05-11 DIAGNOSIS — F331 Major depressive disorder, recurrent, moderate: Secondary | ICD-10-CM

## 2019-05-17 ENCOUNTER — Ambulatory Visit (INDEPENDENT_AMBULATORY_CARE_PROVIDER_SITE_OTHER): Payer: Medicare HMO | Admitting: Psychology

## 2019-05-17 DIAGNOSIS — F331 Major depressive disorder, recurrent, moderate: Secondary | ICD-10-CM

## 2019-05-25 ENCOUNTER — Ambulatory Visit (INDEPENDENT_AMBULATORY_CARE_PROVIDER_SITE_OTHER): Payer: Medicare HMO | Admitting: Psychology

## 2019-05-25 DIAGNOSIS — F331 Major depressive disorder, recurrent, moderate: Secondary | ICD-10-CM | POA: Diagnosis not present

## 2019-05-31 ENCOUNTER — Ambulatory Visit (INDEPENDENT_AMBULATORY_CARE_PROVIDER_SITE_OTHER): Payer: Medicare HMO | Admitting: Psychology

## 2019-05-31 DIAGNOSIS — F331 Major depressive disorder, recurrent, moderate: Secondary | ICD-10-CM

## 2019-06-08 ENCOUNTER — Ambulatory Visit (INDEPENDENT_AMBULATORY_CARE_PROVIDER_SITE_OTHER): Payer: Medicare HMO | Admitting: Psychology

## 2019-06-08 DIAGNOSIS — F331 Major depressive disorder, recurrent, moderate: Secondary | ICD-10-CM | POA: Diagnosis not present

## 2019-06-14 ENCOUNTER — Ambulatory Visit (INDEPENDENT_AMBULATORY_CARE_PROVIDER_SITE_OTHER): Payer: Medicare HMO | Admitting: Psychology

## 2019-06-14 DIAGNOSIS — F331 Major depressive disorder, recurrent, moderate: Secondary | ICD-10-CM | POA: Diagnosis not present

## 2019-06-22 ENCOUNTER — Ambulatory Visit (INDEPENDENT_AMBULATORY_CARE_PROVIDER_SITE_OTHER): Payer: Medicare HMO | Admitting: Psychology

## 2019-06-22 DIAGNOSIS — F331 Major depressive disorder, recurrent, moderate: Secondary | ICD-10-CM

## 2019-06-28 ENCOUNTER — Ambulatory Visit (INDEPENDENT_AMBULATORY_CARE_PROVIDER_SITE_OTHER): Payer: Medicare HMO | Admitting: Psychology

## 2019-06-28 DIAGNOSIS — F331 Major depressive disorder, recurrent, moderate: Secondary | ICD-10-CM

## 2019-07-06 ENCOUNTER — Ambulatory Visit (INDEPENDENT_AMBULATORY_CARE_PROVIDER_SITE_OTHER): Payer: Medicare HMO | Admitting: Psychology

## 2019-07-06 DIAGNOSIS — F4323 Adjustment disorder with mixed anxiety and depressed mood: Secondary | ICD-10-CM

## 2019-07-12 ENCOUNTER — Ambulatory Visit (INDEPENDENT_AMBULATORY_CARE_PROVIDER_SITE_OTHER): Payer: Medicare HMO | Admitting: Psychology

## 2019-07-12 DIAGNOSIS — F4323 Adjustment disorder with mixed anxiety and depressed mood: Secondary | ICD-10-CM

## 2019-07-20 ENCOUNTER — Ambulatory Visit: Payer: Medicare HMO | Admitting: Psychology

## 2019-07-26 ENCOUNTER — Ambulatory Visit (INDEPENDENT_AMBULATORY_CARE_PROVIDER_SITE_OTHER): Payer: Medicare HMO | Admitting: Psychology

## 2019-07-26 DIAGNOSIS — F331 Major depressive disorder, recurrent, moderate: Secondary | ICD-10-CM | POA: Diagnosis not present

## 2019-08-03 ENCOUNTER — Ambulatory Visit (INDEPENDENT_AMBULATORY_CARE_PROVIDER_SITE_OTHER): Payer: Medicare HMO | Admitting: Psychology

## 2019-08-03 DIAGNOSIS — F331 Major depressive disorder, recurrent, moderate: Secondary | ICD-10-CM

## 2019-08-09 ENCOUNTER — Ambulatory Visit (INDEPENDENT_AMBULATORY_CARE_PROVIDER_SITE_OTHER): Payer: Medicare HMO | Admitting: Psychology

## 2019-08-09 DIAGNOSIS — F4323 Adjustment disorder with mixed anxiety and depressed mood: Secondary | ICD-10-CM

## 2019-08-11 ENCOUNTER — Ambulatory Visit: Payer: Medicare HMO | Attending: Internal Medicine

## 2019-08-11 DIAGNOSIS — Z23 Encounter for immunization: Secondary | ICD-10-CM | POA: Insufficient documentation

## 2019-08-11 NOTE — Progress Notes (Signed)
   Covid-19 Vaccination Clinic  Name:  HAVYN ALLI    MRN: [id number] DOB: 1923-01-27  08/11/2019  Ms. Gatti was observed post Covid-19 immunization for 15 minutes without incidence. She was provided with Vaccine Information Sheet and instruction to access the V-Safe system.   Ms. Dalke was instructed to call 911 with any severe reactions post vaccine: Marland Kitchen Difficulty breathing  . Swelling of your face and throat  . A fast heartbeat  . A bad rash all over your body  . Dizziness and weakness    Immunizations Administered    Name Date Dose VIS Date Route   Pfizer COVID-19 Vaccine 08/11/2019 12:39 PM 0.3 mL 07/01/2019 Intramuscular   Manufacturer: [place]   Lot: [id number]   [place]: [id number]

## 2019-08-17 ENCOUNTER — Ambulatory Visit (INDEPENDENT_AMBULATORY_CARE_PROVIDER_SITE_OTHER): Payer: Medicare HMO | Admitting: Psychology

## 2019-08-17 DIAGNOSIS — F331 Major depressive disorder, recurrent, moderate: Secondary | ICD-10-CM

## 2019-08-23 ENCOUNTER — Ambulatory Visit (INDEPENDENT_AMBULATORY_CARE_PROVIDER_SITE_OTHER): Payer: Medicare HMO | Admitting: Psychology

## 2019-08-23 DIAGNOSIS — F331 Major depressive disorder, recurrent, moderate: Secondary | ICD-10-CM | POA: Diagnosis not present

## 2019-08-31 ENCOUNTER — Ambulatory Visit (INDEPENDENT_AMBULATORY_CARE_PROVIDER_SITE_OTHER): Payer: Medicare HMO | Admitting: Psychology

## 2019-08-31 DIAGNOSIS — F4323 Adjustment disorder with mixed anxiety and depressed mood: Secondary | ICD-10-CM

## 2019-09-01 ENCOUNTER — Ambulatory Visit: Payer: Medicare HMO | Attending: Internal Medicine

## 2019-09-01 DIAGNOSIS — Z23 Encounter for immunization: Secondary | ICD-10-CM | POA: Insufficient documentation

## 2019-09-01 NOTE — Progress Notes (Signed)
   Covid-19 Vaccination Clinic  Name:  [name]    MRN: [id number] DOB: 27-Aug-1922  09/01/2019  [name] was observed post Covid-19 immunization for 15 minutes without incidence. She was provided with Vaccine Information Sheet and instruction to access the V-Safe system.   [name] was instructed to call 911 with any severe reactions post vaccine: Marland Kitchen Difficulty breathing  . Swelling of your face and throat  . A fast heartbeat  . A bad rash all over your body  . Dizziness and weakness    Immunizations Administered    Name Date Dose VIS Date Route   Pfizer COVID-19 Vaccine 09/01/2019 12:39 PM 0.3 mL 07/01/2019 Intramuscular   Manufacturer: [place]   Lot: [id number]   [place]: [id number]

## 2019-09-06 ENCOUNTER — Ambulatory Visit (INDEPENDENT_AMBULATORY_CARE_PROVIDER_SITE_OTHER): Payer: Medicare HMO | Admitting: Psychology

## 2019-09-06 DIAGNOSIS — F331 Major depressive disorder, recurrent, moderate: Secondary | ICD-10-CM | POA: Diagnosis not present

## 2019-09-14 ENCOUNTER — Ambulatory Visit (INDEPENDENT_AMBULATORY_CARE_PROVIDER_SITE_OTHER): Payer: Medicare HMO | Admitting: Psychology

## 2019-09-14 DIAGNOSIS — F331 Major depressive disorder, recurrent, moderate: Secondary | ICD-10-CM | POA: Diagnosis not present

## 2019-09-20 ENCOUNTER — Ambulatory Visit (INDEPENDENT_AMBULATORY_CARE_PROVIDER_SITE_OTHER): Payer: Medicare HMO | Admitting: Psychology

## 2019-09-20 DIAGNOSIS — F4323 Adjustment disorder with mixed anxiety and depressed mood: Secondary | ICD-10-CM

## 2019-09-28 ENCOUNTER — Ambulatory Visit (INDEPENDENT_AMBULATORY_CARE_PROVIDER_SITE_OTHER): Payer: Medicare HMO | Admitting: Psychology

## 2019-09-28 DIAGNOSIS — F331 Major depressive disorder, recurrent, moderate: Secondary | ICD-10-CM | POA: Diagnosis not present

## 2019-10-04 ENCOUNTER — Ambulatory Visit (INDEPENDENT_AMBULATORY_CARE_PROVIDER_SITE_OTHER): Payer: Medicare HMO | Admitting: Psychology

## 2019-10-04 DIAGNOSIS — F331 Major depressive disorder, recurrent, moderate: Secondary | ICD-10-CM

## 2019-10-12 ENCOUNTER — Ambulatory Visit: Payer: Medicare HMO | Admitting: Psychology

## 2019-10-18 ENCOUNTER — Ambulatory Visit (INDEPENDENT_AMBULATORY_CARE_PROVIDER_SITE_OTHER): Payer: Medicare HMO | Admitting: Psychology

## 2019-10-18 DIAGNOSIS — F331 Major depressive disorder, recurrent, moderate: Secondary | ICD-10-CM

## 2019-10-26 ENCOUNTER — Ambulatory Visit (INDEPENDENT_AMBULATORY_CARE_PROVIDER_SITE_OTHER): Payer: Medicare HMO | Admitting: Psychology

## 2019-10-26 DIAGNOSIS — F331 Major depressive disorder, recurrent, moderate: Secondary | ICD-10-CM

## 2019-11-01 ENCOUNTER — Ambulatory Visit: Payer: Medicare HMO | Admitting: Psychology

## 2019-11-09 ENCOUNTER — Ambulatory Visit (INDEPENDENT_AMBULATORY_CARE_PROVIDER_SITE_OTHER): Payer: Medicare HMO | Admitting: Psychology

## 2019-11-09 DIAGNOSIS — F4323 Adjustment disorder with mixed anxiety and depressed mood: Secondary | ICD-10-CM | POA: Diagnosis not present

## 2019-11-15 ENCOUNTER — Ambulatory Visit (INDEPENDENT_AMBULATORY_CARE_PROVIDER_SITE_OTHER): Payer: Medicare HMO | Admitting: Psychology

## 2019-11-15 DIAGNOSIS — F4323 Adjustment disorder with mixed anxiety and depressed mood: Secondary | ICD-10-CM | POA: Diagnosis not present

## 2019-11-23 ENCOUNTER — Ambulatory Visit: Payer: Medicare HMO | Admitting: Psychology

## 2019-11-29 ENCOUNTER — Ambulatory Visit (INDEPENDENT_AMBULATORY_CARE_PROVIDER_SITE_OTHER): Payer: Medicare HMO | Admitting: Psychology

## 2019-11-29 DIAGNOSIS — F4323 Adjustment disorder with mixed anxiety and depressed mood: Secondary | ICD-10-CM | POA: Diagnosis not present

## 2019-12-04 ENCOUNTER — Emergency Department (HOSPITAL_BASED_OUTPATIENT_CLINIC_OR_DEPARTMENT_OTHER): Payer: Medicare HMO

## 2019-12-04 ENCOUNTER — Encounter (HOSPITAL_BASED_OUTPATIENT_CLINIC_OR_DEPARTMENT_OTHER): Payer: Self-pay

## 2019-12-04 ENCOUNTER — Inpatient Hospital Stay (HOSPITAL_BASED_OUTPATIENT_CLINIC_OR_DEPARTMENT_OTHER)
Admission: EM | Admit: 2019-12-04 | Discharge: 2019-12-09 | DRG: 552 | Disposition: A | Discharge status: Skilled Nursing Facility | Payer: Medicare HMO | Attending: Family Medicine | Admitting: Family Medicine

## 2019-12-04 ENCOUNTER — Other Ambulatory Visit: Payer: Self-pay

## 2019-12-04 DIAGNOSIS — Y92018 Other place in single-family (private) house as the place of occurrence of the external cause: Secondary | ICD-10-CM

## 2019-12-04 DIAGNOSIS — R197 Diarrhea, unspecified: Secondary | ICD-10-CM | POA: Diagnosis not present

## 2019-12-04 DIAGNOSIS — S22080A Wedge compression fracture of T11-T12 vertebra, initial encounter for closed fracture: Secondary | ICD-10-CM | POA: Diagnosis present

## 2019-12-04 DIAGNOSIS — G629 Polyneuropathy, unspecified: Secondary | ICD-10-CM | POA: Diagnosis present

## 2019-12-04 DIAGNOSIS — Z96641 Presence of right artificial hip joint: Secondary | ICD-10-CM | POA: Diagnosis present

## 2019-12-04 DIAGNOSIS — Z833 Family history of diabetes mellitus: Secondary | ICD-10-CM

## 2019-12-04 DIAGNOSIS — Y9389 Activity, other specified: Secondary | ICD-10-CM | POA: Diagnosis not present

## 2019-12-04 DIAGNOSIS — W010XXA Fall on same level from slipping, tripping and stumbling without subsequent striking against object, initial encounter: Secondary | ICD-10-CM | POA: Diagnosis present

## 2019-12-04 DIAGNOSIS — I129 Hypertensive chronic kidney disease with stage 1 through stage 4 chronic kidney disease, or unspecified chronic kidney disease: Secondary | ICD-10-CM | POA: Diagnosis present

## 2019-12-04 DIAGNOSIS — Z885 Allergy status to narcotic agent status: Secondary | ICD-10-CM | POA: Diagnosis not present

## 2019-12-04 DIAGNOSIS — E871 Hypo-osmolality and hyponatremia: Secondary | ICD-10-CM | POA: Diagnosis present

## 2019-12-04 DIAGNOSIS — F329 Major depressive disorder, single episode, unspecified: Secondary | ICD-10-CM

## 2019-12-04 DIAGNOSIS — N189 Chronic kidney disease, unspecified: Secondary | ICD-10-CM | POA: Diagnosis present

## 2019-12-04 DIAGNOSIS — W19XXXA Unspecified fall, initial encounter: Secondary | ICD-10-CM | POA: Diagnosis not present

## 2019-12-04 DIAGNOSIS — R0902 Hypoxemia: Secondary | ICD-10-CM | POA: Diagnosis not present

## 2019-12-04 DIAGNOSIS — Z888 Allergy status to other drugs, medicaments and biological substances status: Secondary | ICD-10-CM

## 2019-12-04 DIAGNOSIS — E7800 Pure hypercholesterolemia, unspecified: Secondary | ICD-10-CM | POA: Diagnosis not present

## 2019-12-04 DIAGNOSIS — I959 Hypotension, unspecified: Secondary | ICD-10-CM | POA: Diagnosis not present

## 2019-12-04 DIAGNOSIS — M353 Polymyalgia rheumatica: Secondary | ICD-10-CM | POA: Diagnosis present

## 2019-12-04 DIAGNOSIS — M810 Age-related osteoporosis without current pathological fracture: Secondary | ICD-10-CM | POA: Diagnosis present

## 2019-12-04 DIAGNOSIS — Z823 Family history of stroke: Secondary | ICD-10-CM

## 2019-12-04 DIAGNOSIS — F419 Anxiety disorder, unspecified: Secondary | ICD-10-CM | POA: Diagnosis present

## 2019-12-04 DIAGNOSIS — Z79899 Other long term (current) drug therapy: Secondary | ICD-10-CM

## 2019-12-04 DIAGNOSIS — S22000A Wedge compression fracture of unspecified thoracic vertebra, initial encounter for closed fracture: Secondary | ICD-10-CM | POA: Diagnosis not present

## 2019-12-04 DIAGNOSIS — E785 Hyperlipidemia, unspecified: Secondary | ICD-10-CM

## 2019-12-04 DIAGNOSIS — S22089A Unspecified fracture of T11-T12 vertebra, initial encounter for closed fracture: Secondary | ICD-10-CM | POA: Diagnosis not present

## 2019-12-04 DIAGNOSIS — K219 Gastro-esophageal reflux disease without esophagitis: Secondary | ICD-10-CM | POA: Diagnosis present

## 2019-12-04 DIAGNOSIS — Z20822 Contact with and (suspected) exposure to covid-19: Secondary | ICD-10-CM | POA: Diagnosis present

## 2019-12-04 DIAGNOSIS — I10 Essential (primary) hypertension: Secondary | ICD-10-CM | POA: Diagnosis not present

## 2019-12-04 DIAGNOSIS — Z87891 Personal history of nicotine dependence: Secondary | ICD-10-CM

## 2019-12-04 DIAGNOSIS — Z803 Family history of malignant neoplasm of breast: Secondary | ICD-10-CM

## 2019-12-04 DIAGNOSIS — Z66 Do not resuscitate: Secondary | ICD-10-CM | POA: Diagnosis present

## 2019-12-04 DIAGNOSIS — M4854XA Collapsed vertebra, not elsewhere classified, thoracic region, initial encounter for fracture: Secondary | ICD-10-CM | POA: Diagnosis not present

## 2019-12-04 DIAGNOSIS — R000 Tachycardia, unspecified: Secondary | ICD-10-CM | POA: Diagnosis not present

## 2019-12-04 DIAGNOSIS — Z882 Allergy status to sulfonamides status: Secondary | ICD-10-CM | POA: Diagnosis not present

## 2019-12-04 DIAGNOSIS — Z791 Long term (current) use of non-steroidal anti-inflammatories (NSAID): Secondary | ICD-10-CM

## 2019-12-04 DIAGNOSIS — N179 Acute kidney failure, unspecified: Secondary | ICD-10-CM | POA: Diagnosis present

## 2019-12-04 DIAGNOSIS — S22080B Wedge compression fracture of T11-T12 vertebra, initial encounter for open fracture: Secondary | ICD-10-CM | POA: Diagnosis not present

## 2019-12-04 LAB — BASIC METABOLIC PANEL
Anion gap: 16 — ABNORMAL HIGH (ref 5–15)
BUN: 25 mg/dL — ABNORMAL HIGH (ref 8–23)
CO2: 26 mmol/L (ref 22–32)
Calcium: 9.2 mg/dL (ref 8.9–10.3)
Chloride: 91 mmol/L — ABNORMAL LOW (ref 98–111)
Creatinine, Ser: 1.86 mg/dL — ABNORMAL HIGH (ref 0.44–1.00)
GFR calc Af Amer: 26 mL/min — ABNORMAL LOW (ref >60)
GFR calc non Af Amer: 23 mL/min — ABNORMAL LOW (ref >60)
Glucose, Bld: 159 mg/dL — ABNORMAL HIGH (ref 70–99)
Potassium: 3.7 mmol/L (ref 3.5–5.1)
Sodium: 133 mmol/L — ABNORMAL LOW (ref 135–145)

## 2019-12-04 LAB — CBC
HCT: 39.8 % (ref 36.0–46.0)
Hemoglobin: 12.6 g/dL (ref 12.0–15.0)
MCH: 29.4 pg (ref 26.0–34.0)
MCHC: 31.7 g/dL (ref 30.0–36.0)
MCV: 92.8 fL (ref 80.0–100.0)
Platelets: 299 10*3/uL (ref 150–400)
RBC: 4.29 MIL/uL (ref 3.87–5.11)
RDW: 12.6 % (ref 11.5–15.5)
WBC: 15.8 10*3/uL — ABNORMAL HIGH (ref 4.0–10.5)
nRBC: 0 % (ref 0.0–0.2)

## 2019-12-04 LAB — SARS CORONAVIRUS 2 BY RT PCR (HOSPITAL ORDER, PERFORMED IN ~~LOC~~ HOSPITAL LAB): SARS Coronavirus 2: NEGATIVE

## 2019-12-04 MED ORDER — DOCUSATE SODIUM 100 MG PO CAPS: 200.0000 mg | ORAL_CAPSULE | Freq: Two times a day (BID) | ORAL | Status: DC | PRN

## 2019-12-04 MED ORDER — SODIUM CHLORIDE 0.9 % IV SOLN: INTRAVENOUS | Status: DC

## 2019-12-04 MED ORDER — MELOXICAM 7.5 MG PO TABS
7.5000 mg | ORAL_TABLET | Freq: Two times a day (BID) | ORAL | Status: DC
  Administered 2019-12-04 – 2019-12-09 (×10): 7.5 mg via ORAL
  Filled 2019-12-04 (×11): qty 1

## 2019-12-04 MED ORDER — ACETAMINOPHEN 500 MG PO TABS
1000.0000 mg | ORAL_TABLET | Freq: Once | ORAL | Status: AC
  Administered 2019-12-04: 1000 mg via ORAL
  Filled 2019-12-04: qty 2

## 2019-12-04 MED ORDER — ACETAMINOPHEN 325 MG PO TABS
650.0000 mg | ORAL_TABLET | ORAL | Status: DC | PRN
  Administered 2019-12-04 – 2019-12-07 (×8): 650 mg via ORAL
  Filled 2019-12-04 (×8): qty 2

## 2019-12-04 MED ORDER — MIRTAZAPINE 15 MG PO TABS
15.0000 mg | ORAL_TABLET | Freq: Every day | ORAL | Status: DC
  Administered 2019-12-04 – 2019-12-08 (×5): 15 mg via ORAL
  Filled 2019-12-04 (×5): qty 1

## 2019-12-04 MED ORDER — GABAPENTIN 300 MG PO CAPS
300.0000 mg | ORAL_CAPSULE | Freq: Two times a day (BID) | ORAL | Status: DC
  Administered 2019-12-04 – 2019-12-09 (×10): 300 mg via ORAL
  Filled 2019-12-04 (×10): qty 1

## 2019-12-04 MED ORDER — ESCITALOPRAM OXALATE 10 MG PO TABS
5.0000 mg | ORAL_TABLET | Freq: Every day | ORAL | Status: DC
  Administered 2019-12-05 – 2019-12-09 (×5): 5 mg via ORAL
  Filled 2019-12-04 (×5): qty 1

## 2019-12-04 MED ORDER — ONDANSETRON HCL 4 MG PO TABS: 4.0000 mg | ORAL_TABLET | Freq: Four times a day (QID) | ORAL | Status: DC | PRN

## 2019-12-04 MED ORDER — SIMVASTATIN 20 MG PO TABS
20.0000 mg | ORAL_TABLET | Freq: Every evening | ORAL | Status: DC
  Administered 2019-12-04 – 2019-12-09 (×6): 20 mg via ORAL
  Filled 2019-12-04 (×6): qty 1

## 2019-12-04 MED ORDER — POLYVINYL ALCOHOL 1.4 % OP SOLN
1.0000 [drp] | OPHTHALMIC | Status: DC | PRN
  Administered 2019-12-05 (×2): 2 [drp] via OPHTHALMIC
  Filled 2019-12-04: qty 15

## 2019-12-04 MED ORDER — VITAMIN D 25 MCG (1000 UNIT) PO TABS
2000.0000 [IU] | ORAL_TABLET | Freq: Every day | ORAL | Status: DC
  Administered 2019-12-05 – 2019-12-09 (×5): 2000 [IU] via ORAL
  Filled 2019-12-04 (×5): qty 2

## 2019-12-04 MED ORDER — ENOXAPARIN SODIUM 30 MG/0.3ML ~~LOC~~ SOLN
30.0000 mg | SUBCUTANEOUS | Status: DC
  Administered 2019-12-04 – 2019-12-05 (×2): 30 mg via SUBCUTANEOUS
  Filled 2019-12-04 (×2): qty 0.3

## 2019-12-04 MED ORDER — PANTOPRAZOLE SODIUM 40 MG PO TBEC
40.0000 mg | DELAYED_RELEASE_TABLET | Freq: Every day | ORAL | Status: DC
  Administered 2019-12-05 – 2019-12-09 (×5): 40 mg via ORAL
  Filled 2019-12-04 (×5): qty 1

## 2019-12-04 MED ORDER — TRAMADOL HCL 50 MG PO TABS
100.0000 mg | ORAL_TABLET | Freq: Two times a day (BID) | ORAL | Status: DC | PRN
  Administered 2019-12-04 – 2019-12-07 (×5): 100 mg via ORAL
  Filled 2019-12-04 (×6): qty 2

## 2019-12-04 MED ORDER — SODIUM CHLORIDE 0.9 % IV BOLUS
500.0000 mL | Freq: Once | INTRAVENOUS | Status: AC
  Administered 2019-12-04: 500 mL via INTRAVENOUS

## 2019-12-04 MED ORDER — ONDANSETRON HCL 4 MG/2ML IJ SOLN: 4.0000 mg | Freq: Four times a day (QID) | INTRAMUSCULAR | Status: DC | PRN

## 2019-12-04 MED ORDER — LOSARTAN POTASSIUM 50 MG PO TABS
50.0000 mg | ORAL_TABLET | Freq: Every day | ORAL | Status: DC
  Administered 2019-12-05: 50 mg via ORAL
  Filled 2019-12-04: qty 1

## 2019-12-04 NOTE — ED Notes (Signed)
Alecia Lemming, ED Provider at bedside.

## 2019-12-04 NOTE — Progress Notes (Signed)
   Providing Compassionate, Quality Care - Together   CT scan abdomen/pelvis and CT lumbar spine were reviewed by Dr. [name] and myself. Patient with T12 vertebral body fracture with approximately 4 mm retropulsion. Per report, the patient's exam is not concerning for neural impingement. Recommend bracing with clamshell style TLSO brace and mobilizing with therapies in addition to pain control. She can follow up in the office with Dr. [name] as an outpatient in 6 weeks for follow up x-rays.  Viona Gilmore, DNP, AGNP-C Nurse Practitioner 12/04/2019 6:12 PM  [facility] [place] [address], [place], [place] [address] P: [phone]    F: [phone]

## 2019-12-04 NOTE — Progress Notes (Signed)
Called and Orthopedic Tech Progress Note Patient Details:  [name] 01/27/1923 [id number]  Patient ID: [name], female   DOB: 1923/06/14, 86 y.o.   MRN: [id number]   [name] 12/04/2019, 7:10 PMCalled and Routed TLSO brace order to [facility].

## 2019-12-04 NOTE — H&P (Addendum)
History and Physical    [name] [id number] DOB: 01-16-1923 DOA: 12/04/2019  PCP: Harrison Mons, PA  Patient coming from: Home via [facility] ED   Chief Complaint: Back pain  HPI: [name] is a 86 y.o. female with medical history significant of HTN, GERD, HLD. Presents after a mechanical fall. She reports that while she was trying to move some packages from her porch, she fell back on to her butt and then rolled back on her back and her head. She remembers the entire fall. When she threw one of her packages, the weight of the package pulled her back to initiate the fall. She was unable to get up on her own, so she called EMS. She was on the ground for about 20 or so minutes before she was assisted up. No head injury was noted and she refused to go to the ED. She denied any pain at the time and stayed home. Saturday she began having pain at her flanks and tried her best to ignore it. It was worse with moving. Nothing relieved it. She tried to stay in bed and not move, but the pain has progressed. Her daughter insisted that she go to the ED today, so she did.   ED Course: At [facility] she was evaluated. A CT of the ab/pelvis revealed an acute T12 fracture. TRH at [facility] was called for admission.   Review of Systems: Reports back pain worse with movement. Denies numbness, tingling, LOC, incontinence.  Remainder of 10 point review of systems is otherwise negative for all not mentioned in HPI.    Past Medical History:  Diagnosis Date  . Allergy   . Arthritis   . Depression   . Endometriosis   . GERD (gastroesophageal reflux disease)   . Hearing loss 12/08/2011   bil, hearing aids  . Hyperlipidemia   . Hypertension   . Osteoporosis   . Ovarian cyst   . Polymyalgia rheumatica ([place])   . Post herpetic neuralgia   . S/P right THA, AA 12/16/2011  . Shingles 2020  . Skin cancer   . Spinal stenosis     Past Surgical History:  Procedure Laterality Date  . CATARACT EXTRACTION, BILATERAL   12-08-11   bil  . DILATION AND CURETTAGE OF UTERUS    . HERNIA REPAIR    . HYSTEROSCOPY    . OOPHORECTOMY     bilateral  . STERIOD INJECTION  12/09/11   to spine for rt calf pain and spasms  . TIBIA FRACTURE SURGERY     trauma  . TONSILLECTOMY AND ADENOIDECTOMY    . TOTAL HIP ARTHROPLASTY  12/16/2011   Procedure: TOTAL HIP ARTHROPLASTY ANTERIOR APPROACH;  Surgeon: Mauri Pole, MD;  Location: [facility] ORS;  Service: Orthopedics;  Laterality: Right;     reports that she has quit smoking. She has never used smokeless tobacco. She reports that she does not drink alcohol or use drugs.  Allergies  Allergen Reactions  . Cartia Xt [Diltiazem] Other (See Comments)    BRADYCARDIA IN 40'S  . Codeine Other (See Comments)    syncope  . Lisinopril-Hydrochlorothiazide Cough  . Mercury Rash  . Sulfa Antibiotics   . Sulfonamide Derivatives Rash    swelling    Family History  Problem Relation Age of Onset  . Stroke Sister   . Diabetes Daughter   . Cancer Father        esophogeal  . Cancer Paternal Aunt  stomach  . Breast cancer Maternal Grandmother        Age 20  . Stroke Mother     Prior to Admission medications   Medication Sig Start Date End Date Taking? Authorizing Provider  acetaminophen (TYLENOL) 325 MG tablet Take 650 mg by mouth every 4 (four) hours as needed for mild pain, moderate pain, fever or headache.    Yes [provider]  Cholecalciferol (VITAMIN D3) 50 MCG (2000 UT) TABS Take 2,000 Units by mouth daily.   Yes [provider]  escitalopram (LEXAPRO) 5 MG tablet Take 5 mg by mouth in the morning and at bedtime.  07/08/18  Yes [provider]  gabapentin (NEURONTIN) 300 MG capsule Take 300 mg by mouth in the morning and at bedtime. 11/27/19  Yes [provider]  losartan (COZAAR) 50 MG tablet Take 50 mg by mouth daily. 10/15/19  Yes [provider]  meloxicam (MOBIC) 7.5 MG tablet Take 7.5 mg by mouth 2 (two) times daily.  11/03/19  Yes [provider]  mirtazapine (REMERON) 15 MG tablet Take 15 mg by mouth at bedtime. 10/07/19  Yes [provider]  omeprazole (PRILOSEC) 20 MG capsule Take 1 capsule (20 mg total) by mouth daily. 08/04/14  Yes Swords, Darrick Penna, MD  simvastatin (ZOCOR) 20 MG tablet TAKE 1 TABLET EVERY DAY Patient taking differently: Take 20 mg by mouth every evening.  09/29/13  Yes Swords, Darrick Penna, MD  amLODipine (NORVASC) 5 MG tablet Take 1 tablet (5 mg total) by mouth daily. Patient not taking: Reported on 12/04/2019 02/03/16   Tomma Rakers, MD  bisacodyl (DULCOLAX) 5 MG EC tablet Take 1 tablet (5 mg total) by mouth daily as needed for moderate constipation. Patient not taking: Reported on 12/04/2019 02/03/16   Tomma Rakers, MD  docusate sodium (COLACE) 100 MG capsule Take 2 capsules (200 mg total) by mouth 2 (two) times daily as needed for mild constipation. Patient not taking: Reported on 08/25/2018 02/17/16   Thurnell Lose, MD  feeding supplement, ENSURE ENLIVE, (ENSURE ENLIVE) LIQD Take 237 mLs by mouth 2 (two) times daily between meals. Give 2 week supply Patient not taking: Reported on 12/04/2019 02/17/16   Thurnell Lose, MD  mirtazapine (REMERON) 30 MG tablet Take 1 tablet (30 mg total) by mouth at bedtime. Patient not taking: Reported on 12/04/2019 03/18/16 08/25/18  Norma Fredrickson, MD  polyethylene glycol (MIRALAX / Floria Raveling) packet Take 17 g by mouth 2 (two) times daily. Patient not taking: Reported on 12/04/2019 02/03/16   Tomma Rakers, MD  predniSONE (DELTASONE) 20 MG tablet Take 1 tablet (20 mg total) by mouth daily with breakfast. Patient not taking: Reported on 08/25/2018 02/17/16   Thurnell Lose, MD    Physical Exam: Vitals:   12/04/19 1518 12/04/19 1519 12/04/19 1536 12/04/19 1641  BP: (!) 149/81  (!) 153/80 135/73  Pulse: 81  60 60  Resp: 18  18 16   Temp:   97.7 F (36.5 C) 98 F (36.7 C)  TempSrc:    Oral  SpO2:  92% 92% 95%    Weight:      Height:        Constitutional: 86 y.o. female NAD, calm, comfortable Vitals:   12/04/19 1518 12/04/19 1519 12/04/19 1536 12/04/19 1641  BP: (!) 149/81  (!) 153/80 135/73  Pulse: 81  60 60  Resp: 18  18 16   Temp:   97.7 F (36.5 C) 98 F (36.7 C)  TempSrc:  Oral  SpO2:  92% 92% 95%  Weight:      Height:       Eyes: PERRL, lids and conjunctivae normal ENMT: Mucous membranes are moist. Posterior pharynx clear of any exudate or lesions.Normal dentition.  Neck: normal, supple, no masses, no thyromegaly Respiratory: clear to auscultation bilaterally, no wheezing, no crackles. Normal respiratory effort. No accessory muscle use.  Cardiovascular: Regular rate and rhythm, no rubs / gallops. 1/6 SEM No extremity edema (she is wearing compression stockings). 2+ pedal pulses. No carotid bruits.  Abdomen: no tenderness, no masses palpated. No hepatosplenomegaly. Bowel sounds positive.  Musculoskeletal: no clubbing / cyanosis. No joint deformity upper and lower extremities. No contractures. Normal muscle tone. TTP/spasm at flanks and just lateral to spine bilaterally at lower thoracic levels Neurologic: CN 2-12 grossly intact. Sensation intact. Strength 5/5 in all 4.  Psychiatric: Normal judgment and insight. Alert and oriented x 3. Normal mood.   Labs on Admission: I have personally reviewed following labs and imaging studies  CBC: Recent Labs  Lab 12/04/19 1047  WBC 15.8*  HGB 12.6  HCT 39.8  MCV 92.8  PLT 123XX123   Basic Metabolic Panel: Recent Labs  Lab 12/04/19 1047  NA 133*  K 3.7  CL 91*  CO2 26  GLUCOSE 159*  BUN 25*  CREATININE 1.86*  CALCIUM 9.2   GFR: Estimated Creatinine Clearance: 18.1 mL/min (A) (by C-G formula based on SCr of 1.86 mg/dL (H)). Liver Function Tests: No results for input(s): AST, ALT, ALKPHOS, BILITOT, PROT, ALBUMIN in the last 168 hours. No results for input(s): LIPASE, AMYLASE in the last 168 hours. No results for input(s):  AMMONIA in the last 168 hours. Coagulation Profile: No results for input(s): INR, PROTIME in the last 168 hours. Cardiac Enzymes: No results for input(s): CKTOTAL, CKMB, CKMBINDEX, TROPONINI in the last 168 hours. BNP (last 3 results) No results for input(s): PROBNP in the last 8760 hours. HbA1C: No results for input(s): HGBA1C in the last 72 hours. CBG: No results for input(s): GLUCAP in the last 168 hours. Lipid Profile: No results for input(s): CHOL, HDL, LDLCALC, TRIG, CHOLHDL, LDLDIRECT in the last 72 hours. Thyroid Function Tests: No results for input(s): TSH, T4TOTAL, FREET4, T3FREE, THYROIDAB in the last 72 hours. Anemia Panel: No results for input(s): VITAMINB12, FOLATE, FERRITIN, TIBC, IRON, RETICCTPCT in the last 72 hours. Urine analysis:    Component Value Date/Time   COLORURINE YELLOW 02/14/2016 1844   APPEARANCEUR CLEAR 02/14/2016 1844   LABSPEC 1.013 02/14/2016 1844   PHURINE 5.5 02/14/2016 1844   GLUCOSEU NEGATIVE 02/14/2016 1844   HGBUR TRACE (A) 02/14/2016 1844   BILIRUBINUR NEGATIVE 02/14/2016 1844   BILIRUBINUR n 12/06/2010 0000   KETONESUR NEGATIVE 02/14/2016 1844   PROTEINUR NEGATIVE 02/14/2016 1844   UROBILINOGEN 0.2 12/19/2011 1104   NITRITE NEGATIVE 02/14/2016 1844   LEUKOCYTESUR NEGATIVE 02/14/2016 1844    Radiological Exams on Admission: CT ABDOMEN PELVIS WO CONTRAST  Result Date: 12/04/2019 CLINICAL DATA:  Abdominal trauma. Flank pain. EXAM: CT ABDOMEN AND PELVIS WITHOUT CONTRAST TECHNIQUE: Multidetector CT imaging of the abdomen and pelvis was performed following the standard protocol without IV contrast. COMPARISON:  02/15/2016. FINDINGS: Lower chest: Peripheral predominant interstitial reticulation is noted in both lower lobes. No acute abnormality. Hepatobiliary: Within the limitations of unenhanced technique there is no evidence for up attic injury or perihepatic hematoma. Benign calcification noted within the dome of liver measuring 7 mm. Small  low attenuation structure within posterior right hepatic lobe measures 5  mm and is too small to characterize. The gallbladder is unremarkable. No biliary dilatation. Pancreas: Unremarkable. No pancreatic ductal dilatation or surrounding inflammatory changes. Spleen: No splenic injury or perisplenic hematoma. Adrenals/Urinary Tract: No adrenal hemorrhage or renal injury identified. Bladder is unremarkable. Stomach/Bowel: Small hiatal hernia. Stomach nondistended. No evidence of bowel wall thickening, distention, or inflammatory changes. Predominantly is left-sided colonic diverticulosis noted without acute inflammation. Vascular/Lymphatic: Aortic atherosclerosis. No aneurysm. No abdominopelvic adenopathy. Reproductive: Uterus and bilateral adnexa are unremarkable. Other: No abdominal wall hernia or abnormality. No abdominopelvic ascites. Musculoskeletal: Acute fracture deformity is identified involving the T12 vertebra with approximately 4 mm of retropulsion of fracture fragments. There is loss of approximately 50% of the vertebral body height. The remaining lumbar vertebral body heights are well preserved. Multi level degenerative disc disease identified within the visualized thoracolumbar spine. Bilateral facet degenerative change noted at L4-5 and L5-S1. Previous right hip arthroplasty. No additional fractures identified. IMPRESSION: 1. Acute fracture deformity involving the T12 vertebra with approximately 4 mm of retropulsion of fracture fragments. 2. Aortic atherosclerosis. 3. Left-sided colonic diverticulosis without acute inflammation. Aortic Atherosclerosis (ICD10-I70.0). Electronically Signed   By: Kerby Moors M.D.   On: 12/04/2019 12:50   CT L-SPINE NO CHARGE  Result Date: 12/04/2019 CLINICAL DATA:  [place] 2 days ago at home.  Low back pain. EXAM: CT LUMBAR SPINE WITHOUT CONTRAST TECHNIQUE: Multidetector CT imaging of the lumbar spine was performed without intravenous contrast administration.  Multiplanar CT image reconstructions were also generated. COMPARISON:  02/14/2016 FINDINGS: Segmentation: 5 lumbar type vertebral bodies. Alignment: 2 mm degenerative anterolisthesis L4-5 and L5-S1. Vertebrae: Acute compression fracture of the T12 vertebral body with loss of height of 50%. Posterior bowing of the posterior margin of the vertebral body by 5 mm encroaches upon the spinal canal. No posterior element fracture. Paraspinal and other soft tissues: See results of abdominal CT. Disc levels: No significant disc space finding at T11-12, T12-L1 or L1-2. L2-3: Disc bulge. Facet and ligamentous hypertrophy. Moderate stenosis. L3-4: Disc bulge.  Facet hypertrophy.  Moderate stenosis. L4-5: Facet arthropathy with 2 mm of anterolisthesis. Disc bulge. Severe multifactorial stenosis. L5-S1: Facet arthropathy with 2 mm of anterolisthesis. Disc bulge. Mild stenosis of the lateral recesses and foramina. IMPRESSION: Acute compression fracture at T12 with loss of height of 50%. Posterior bowing of the posterosuperior margin of the vertebral body by 5 mm encroaches upon the spinal canal. No sign that this is anything other than a benign fracture. Lower lumbar degenerative disc disease and degenerative facet disease. Severe multifactorial spinal stenosis at L4-5. Moderate stenosis at L2-3 and L3-4. Lateral recess and foraminal stenosis at L5-S1. Electronically Signed   By: Nelson Chimes M.D.   On: 12/04/2019 12:31   Assessment/Plan Active Problems:   Compression fracture of body of thoracic vertebra (HCC)   Acute fracture of T12 after mechanical fall     - admit to inpatient     - consult placed to neurosurgery at 1735hrs; they are reviewing case    - PT/OT consults    - TOC consult    - pain control w/ APAP and ultram (has codeine allergy of "syncope" entered in 2011, but tolerated ultram in 2017)  HTN     - resume home losartan  GERD     - continue PPI  Neuropathy     - Continue gabapentin  HLD     -  continue zocor  Depression/Anxiety     - lexapro, remeron  AKI     -  SCr is 1.86. She is 3x baseline.      - IVF  Hyponatremia     - mild; she will get fluids, monitor  Leukocytosis     - afebrile; likely reactive to pain, follow.   DVT prophylaxis: lovenox  Code Status: DNR confirmed by dtr Family Communication: Spoke with dtr by phone and updated with plan.  Consults called: Neurosurgery  Status is: Inpatient  Remains inpatient appropriate because:Ongoing active pain requiring inpatient pain management   Dispo: The patient is from: Home              Anticipated d/c is to: SNF              Anticipated d/c date is: 2 days              Patient currently is medically stable to d/c.  Jonnie Finner DO [facility]  If 7PM-7AM, please contact night-coverage [email or web address]  12/04/2019, 5:34 PM

## 2019-12-04 NOTE — ED Provider Notes (Addendum)
[facility] EMERGENCY DEPARTMENT Provider Note   CSN: [id number] Arrival date & time: 12/04/19  [id number]     History Chief Complaint  Patient presents with  . Fall    [name] is a 86 y.o. female.  Patient presents to the emergency department today after a fall occurring 2 days ago in the afternoon.  Patient tripped while on the porch trying to pick up a package.  She fell onto her buttocks and then her back and then struck the back of her head.  EMS was called to assist her to standing.  She was evaluated and refused transport.  Since that time, family member at bedside, states that the patient has been in bed.  She complains of severe pain, especially with movement, and her bilateral flanks and lateral back.  She denies any headache, confusion, vomiting, neck pain, numbness or tingling in extremities.  She denies any anterior abdominal pain.  No chest pain or shortness of breath.  No bruising.  No history of anticoagulation.        Past Medical History:  Diagnosis Date  . Allergy   . Arthritis   . Depression   . Endometriosis   . GERD (gastroesophageal reflux disease)   . Hearing loss 12/08/2011   bil, hearing aids  . Hyperlipidemia   . Hypertension   . Osteoporosis   . Ovarian cyst   . Polymyalgia rheumatica ([place])   . Post herpetic neuralgia   . S/P right THA, AA 12/16/2011  . Shingles 2020  . Skin cancer   . Spinal stenosis     Patient Active Problem List   Diagnosis Date Noted  . Fever, unspecified 02/15/2016  . Anxiety 02/15/2016  . Anemia 02/15/2016  . Hyponatremia 02/15/2016  . Bradycardia   . Constipation   . Depression   . MDD (major depressive disorder), recurrent severe, without psychosis ([place])   . Symptomatic bradycardia 01/30/2016  . Symptomatic anemia 01/29/2016  . Hypertension   . Osteoporosis   . Hyperlipidemia   . GERD (gastroesophageal reflux disease)   . Spinal stenosis   . Hearing loss 12/08/2011  . Osteoarthritis of hip  12/02/2011  . Endometriosis   . DIVERTICULOSIS-COLON 02/12/2009  . ALLERGIC RHINITIS 01/19/2007  . ESOPHAGEAL STRICTURE 01/19/2007    Past Surgical History:  Procedure Laterality Date  . CATARACT EXTRACTION, BILATERAL  12-08-11   bil  . DILATION AND CURETTAGE OF UTERUS    . HERNIA REPAIR    . HYSTEROSCOPY    . OOPHORECTOMY     bilateral  . STERIOD INJECTION  12/09/11   to spine for rt calf pain and spasms  . TIBIA FRACTURE SURGERY     trauma  . TONSILLECTOMY AND ADENOIDECTOMY    . TOTAL HIP ARTHROPLASTY  12/16/2011   Procedure: TOTAL HIP ARTHROPLASTY ANTERIOR APPROACH;  Surgeon: Mauri Pole, MD;  Location: [facility];  Service: Orthopedics;  Laterality: Right;     OB History    Gravida  4   Para  4   Term  4   Preterm      AB      Living  2     SAB      TAB      Ectopic      Multiple      Live Births              Family History  Problem Relation Age of Onset  . Stroke Sister   . Diabetes  Daughter   . Cancer Father        esophogeal  . Cancer Paternal Aunt        stomach  . Breast cancer Maternal Grandmother        Age 56  . Stroke Mother     Social History   Tobacco Use  . Smoking status: Former Research scientist (life sciences)  . Smokeless tobacco: Never Used  Substance Use Topics  . Alcohol use: No    Alcohol/week: 1.0 standard drinks    Types: 1 Standard drinks or equivalent per week  . Drug use: No    Home Medications Prior to Admission medications   Medication Sig Start Date End Date Taking? Authorizing Provider  acetaminophen (TYLENOL) 325 MG tablet Take 650 mg by mouth every 4 (four) hours as needed for mild pain, moderate pain, fever or headache.     [provider]  amLODipine (NORVASC) 5 MG tablet Take 1 tablet (5 mg total) by mouth daily. 02/03/16   Hollice Gong, Mir Earlie Server, MD  aspirin EC 81 MG tablet Take 81 mg by mouth daily.    [provider]  bisacodyl (DULCOLAX) 5 MG EC tablet Take 1 tablet (5 mg total) by mouth daily as needed  for moderate constipation. 02/03/16   Hollice Gong, Mir Earlie Server, MD  cholecalciferol (VITAMIN D) 1000 UNITS tablet Take 2,000 Units by mouth daily.     [provider]  clonazePAM (KLONOPIN) 1 MG tablet Take 0.5 mg by mouth 3 (three) times daily as needed for anxiety.     [provider]  docusate sodium (COLACE) 100 MG capsule Take 2 capsules (200 mg total) by mouth 2 (two) times daily as needed for mild constipation. Patient not taking: Reported on 08/25/2018 02/17/16   Thurnell Lose, MD  escitalopram (LEXAPRO) 5 MG tablet TAKE 1 TABLET BY MOUTH TWICE A DAY **D/C 10MG  TABS** 07/08/18   [provider]  feeding supplement, ENSURE ENLIVE, (ENSURE ENLIVE) LIQD Take 237 mLs by mouth 2 (two) times daily between meals. Give 2 week supply 02/17/16   Thurnell Lose, MD  fluticasone [facility]) 50 MCG/ACT nasal spray Place 2 sprays into both nostrils daily as needed for allergies.     [provider]  mirtazapine (REMERON) 30 MG tablet Take 1 tablet (30 mg total) by mouth at bedtime. 03/18/16 08/25/18  Norma Fredrickson, MD  Multiple Vitamin (MULTIVITAMIN) tablet Take 1 tablet by mouth daily.      [provider]  Multiple Vitamins-Minerals (PRESERVISION AREDS 2 PO) Take 2 tablets by mouth daily.    [provider]  Omega-3 Fatty Acids (FISH OIL) 1000 MG CPDR Take by mouth.    [provider]  omeprazole (PRILOSEC) 20 MG capsule Take 1 capsule (20 mg total) by mouth daily. Patient taking differently: Take 20 mg by mouth daily as needed (for heartburn).  08/04/14   Swords, Darrick Penna, MD  ondansetron (ZOFRAN) 4 MG tablet Take 2-4 mg by mouth every 8 (eight) hours as needed for nausea or vomiting.    [provider]  polyethylene glycol (MIRALAX / GLYCOLAX) packet Take 17 g by mouth 2 (two) times daily. 02/03/16   Hollice Gong, Mir Mohammed, MD  predniSONE (DELTASONE) 20 MG tablet Take 1 tablet (20 mg total) by mouth daily with breakfast. Patient not  taking: Reported on 08/25/2018 02/17/16   Thurnell Lose, MD  sertraline (ZOLOFT) 25 MG tablet Take 75 mg by mouth daily.     [provider]  simvastatin (ZOCOR) 20  MG tablet TAKE 1 TABLET EVERY DAY Patient taking differently: TAKE 20 MG BY MOUTH EVERY EVENING 09/29/13   Swords, Darrick Penna, MD  traMADol (ULTRAM) 50 MG tablet Take 50 mg by mouth 2 (two) times daily.    [provider]  triamcinolone cream (KENALOG) 0.1 % Place 1 application vaginally 2 (two) times daily as needed (for itching).    [provider]    Allergies    Cartia xt [diltiazem], Codeine, Lisinopril-hydrochlorothiazide, Mercury, Sulfa antibiotics, and Sulfonamide derivatives  Review of Systems   Review of Systems  Constitutional: Negative for fever.  HENT: Negative for rhinorrhea and sore throat.   Eyes: Negative for redness.  Respiratory: Negative for cough.   Cardiovascular: Negative for chest pain.  Gastrointestinal: Negative for abdominal pain, diarrhea, nausea and vomiting.  Genitourinary: Positive for flank pain. Negative for dysuria and hematuria.  Musculoskeletal: Positive for arthralgias, back pain, gait problem and myalgias. Negative for neck pain.  Skin: Negative for rash.  Neurological: Negative for headaches.  Psychiatric/Behavioral: Negative for confusion.    Physical Exam Updated Vital Signs BP 140/82 (BP Location: Right Arm)   Pulse 93   Temp 99 F (37.2 C) (Oral)   Resp 16   Ht 5\' 4"  (1.626 m)   Wt 72.6 kg   SpO2 93%   BMI 27.46 kg/m   Physical Exam Vitals and nursing note reviewed.  Constitutional:      Appearance: She is well-developed.  HENT:     Head: Normocephalic and atraumatic. No raccoon eyes or Battle's sign.     Right Ear: Tympanic membrane, ear canal and external ear normal. No hemotympanum.     Left Ear: Tympanic membrane, ear canal and external ear normal. No hemotympanum.     Nose: Nose normal.     Mouth/Throat:     Pharynx: Uvula midline.    Eyes:     General: Lids are normal.     Extraocular Movements:     Right eye: No nystagmus.     Left eye: No nystagmus.     Conjunctiva/sclera: Conjunctivae normal.     Pupils: Pupils are equal, round, and reactive to light.     Comments: No visible hyphema noted  Cardiovascular:     Rate and Rhythm: Normal rate and regular rhythm.  Pulmonary:     Effort: Pulmonary effort is normal.     Breath sounds: Normal breath sounds.  Abdominal:     Palpations: Abdomen is soft.     Tenderness: There is no abdominal tenderness.  Musculoskeletal:     Cervical back: Normal range of motion and neck supple. No tenderness or bony tenderness.     Thoracic back: No tenderness or bony tenderness.     Lumbar back: Tenderness present. No bony tenderness.       Back:     Right hip: No tenderness. Normal range of motion.     Left hip: No tenderness. Normal range of motion.     Right knee: Normal range of motion.     Left knee: Normal range of motion.  Skin:    General: Skin is warm and dry.  Neurological:     Mental Status: She is alert and oriented to person, place, and time.     GCS: GCS eye subscore is 4. GCS verbal subscore is 5. GCS motor subscore is 6.     Cranial Nerves: No cranial nerve deficit.     Sensory: No sensory deficit.     Coordination: Coordination normal.  Deep Tendon Reflexes: Reflexes are normal and symmetric.     ED Results / Procedures / Treatments   Labs (all labs ordered are listed, but only abnormal results are displayed) Labs Reviewed  CBC - Abnormal; Notable for the following components:      Result Value   WBC 15.8 (*)    All other components within normal limits  BASIC METABOLIC PANEL - Abnormal; Notable for the following components:   Sodium 133 (*)    Chloride 91 (*)    Glucose, Bld 159 (*)    BUN 25 (*)    Creatinine, Ser 1.86 (*)    GFR calc non Af Amer 23 (*)    GFR calc Af Amer 26 (*)    Anion gap 16 (*)    All other components within normal  limits  SARS CORONAVIRUS 2 BY RT PCR (HOSPITAL ORDER, [facility])  URINALYSIS, ROUTINE W REFLEX MICROSCOPIC    EKG None  Radiology CT L-SPINE NO CHARGE  Result Date: 12/04/2019 CLINICAL DATA:  [place] 2 days ago at home.  Low back pain. EXAM: CT LUMBAR SPINE WITHOUT CONTRAST TECHNIQUE: Multidetector CT imaging of the lumbar spine was performed without intravenous contrast administration. Multiplanar CT image reconstructions were also generated. COMPARISON:  02/14/2016 FINDINGS: Segmentation: 5 lumbar type vertebral bodies. Alignment: 2 mm degenerative anterolisthesis L4-5 and L5-S1. Vertebrae: Acute compression fracture of the T12 vertebral body with loss of height of 50%. Posterior bowing of the posterior margin of the vertebral body by 5 mm encroaches upon the spinal canal. No posterior element fracture. Paraspinal and other soft tissues: See results of abdominal CT. Disc levels: No significant disc space finding at T11-12, T12-L1 or L1-2. L2-3: Disc bulge. Facet and ligamentous hypertrophy. Moderate stenosis. L3-4: Disc bulge.  Facet hypertrophy.  Moderate stenosis. L4-5: Facet arthropathy with 2 mm of anterolisthesis. Disc bulge. Severe multifactorial stenosis. L5-S1: Facet arthropathy with 2 mm of anterolisthesis. Disc bulge. Mild stenosis of the lateral recesses and foramina. IMPRESSION: Acute compression fracture at T12 with loss of height of 50%. Posterior bowing of the posterosuperior margin of the vertebral body by 5 mm encroaches upon the spinal canal. No sign that this is anything other than a benign fracture. Lower lumbar degenerative disc disease and degenerative facet disease. Severe multifactorial spinal stenosis at L4-5. Moderate stenosis at L2-3 and L3-4. Lateral recess and foraminal stenosis at L5-S1. Electronically Signed   By: Nelson Chimes M.D.   On: 12/04/2019 12:31    Procedures Procedures (including critical care time)  Medications Ordered in  ED Medications  acetaminophen (TYLENOL) tablet 1,000 mg (1,000 mg Oral Given 12/04/19 1040)  sodium chloride 0.9 % bolus 500 mL (0 mLs Intravenous Stopped 12/04/19 1341)  sodium chloride 0.9 % bolus 500 mL (500 mLs Intravenous New Bag/Given 12/04/19 1341)    ED Course  I have reviewed the triage vital signs and the nursing notes.  Pertinent labs & imaging results that were available during my care of the patient were reviewed by me and considered in my medical decision making (see chart for details).  Patient seen and examined.  Patient has poorly localized pain and that is in the flanks and lateral back bilaterally which is worse with movement.  Patient has been bedridden since her fall which is unusual for her.  She struck the back of her head but has had no decompensation or confusion or other concerning signs of head injury progressing over the past 2 days.  She  is not on any blood thinners.  She has no point tenderness in her neck or neurologic compromise in the extremities.  Given the severity of her symptoms, will obtain CT imaging of the abdomen and pelvis and also look at the L-spine to ensure no signs of compression fracture.  Will give Tylenol for pain.  Review of Care Everywhere shows BUN/creatinine on 06/06/2019 was 24/0.77.   Vital signs reviewed and are as follows: BP 140/82 (BP Location: Right Arm)   Pulse 93   Temp 99 F (37.2 C) (Oral)   Resp 16   Ht 5\' 4"  (1.626 m)   Wt 72.6 kg   SpO2 93%   BMI 27.46 kg/m   1:57 PM patient has acute T12 compression fracture.  Patient is comfortable lying still but has significant pain with any movement.  I had a couple of conversations with the patient and daughter at bedside regarding admission versus trial at home.  Given her limitations in mobility and acute kidney injury, feel that patient would best served by admission to the hospital at this point.  She will need IV hydration and monitoring of her kidney function.  2:15 PM Spoke  with Dr. Darrick Meigs who accepts to med[facility]surg at [facility]     MDM Rules/Calculators/A&P                      Admit.     Final Clinical Impression(s) / ED Diagnoses Final diagnoses:  Fall  Acute kidney injury ([place])  Compression fracture of T12 vertebra, initial encounter [facility]    Rx / [place] Orders ED Discharge Orders    None        Carlisle Cater, PA-C 12/04/19 1415    Veryl Speak, MD 12/05/19 1114

## 2019-12-04 NOTE — ED Triage Notes (Signed)
Pt reports falling on Friday after tripping on the landing to the porch. Pt reports falling backwards landing on her behind, then back, then head, denies blood thinners, denies LOC. Pt reports EMS came to home and helped her stand, was not taken to ED.

## 2019-12-05 DIAGNOSIS — S22080B Wedge compression fracture of T11-T12 vertebra, initial encounter for open fracture: Secondary | ICD-10-CM

## 2019-12-05 DIAGNOSIS — N179 Acute kidney failure, unspecified: Secondary | ICD-10-CM

## 2019-12-05 DIAGNOSIS — S22000A Wedge compression fracture of unspecified thoracic vertebra, initial encounter for closed fracture: Secondary | ICD-10-CM

## 2019-12-05 DIAGNOSIS — W19XXXA Unspecified fall, initial encounter: Secondary | ICD-10-CM

## 2019-12-05 DIAGNOSIS — S22080A Wedge compression fracture of T11-T12 vertebra, initial encounter for closed fracture: Principal | ICD-10-CM

## 2019-12-05 DIAGNOSIS — E7800 Pure hypercholesterolemia, unspecified: Secondary | ICD-10-CM

## 2019-12-05 LAB — CBC
HCT: 30.9 % — ABNORMAL LOW (ref 36.0–46.0)
Hemoglobin: 9.6 g/dL — ABNORMAL LOW (ref 12.0–15.0)
MCH: 29.4 pg (ref 26.0–34.0)
MCHC: 31.1 g/dL (ref 30.0–36.0)
MCV: 94.8 fL (ref 80.0–100.0)
Platelets: 216 10*3/uL (ref 150–400)
RBC: 3.26 MIL/uL — ABNORMAL LOW (ref 3.87–5.11)
RDW: 12.4 % (ref 11.5–15.5)
WBC: 10.9 10*3/uL — ABNORMAL HIGH (ref 4.0–10.5)
nRBC: 0 % (ref 0.0–0.2)

## 2019-12-05 LAB — COMPREHENSIVE METABOLIC PANEL
ALT: 18 U/L (ref 0–44)
AST: 38 U/L (ref 15–41)
Albumin: 2.8 g/dL — ABNORMAL LOW (ref 3.5–5.0)
Alkaline Phosphatase: 86 U/L (ref 38–126)
Anion gap: 11 (ref 5–15)
BUN: 34 mg/dL — ABNORMAL HIGH (ref 8–23)
CO2: 28 mmol/L (ref 22–32)
Calcium: 8.2 mg/dL — ABNORMAL LOW (ref 8.9–10.3)
Chloride: 97 mmol/L — ABNORMAL LOW (ref 98–111)
Creatinine, Ser: 1.55 mg/dL — ABNORMAL HIGH (ref 0.44–1.00)
GFR calc Af Amer: 33 mL/min — ABNORMAL LOW (ref >60)
GFR calc non Af Amer: 28 mL/min — ABNORMAL LOW (ref >60)
Glucose, Bld: 141 mg/dL — ABNORMAL HIGH (ref 70–99)
Potassium: 3.3 mmol/L — ABNORMAL LOW (ref 3.5–5.1)
Sodium: 136 mmol/L (ref 135–145)
Total Bilirubin: 0.4 mg/dL (ref 0.3–1.2)
Total Protein: 5.8 g/dL — ABNORMAL LOW (ref 6.5–8.1)

## 2019-12-05 MED ORDER — FENTANYL CITRATE (PF) 100 MCG/2ML IJ SOLN: 12.5000 ug | INTRAMUSCULAR | Status: DC | PRN

## 2019-12-05 MED ORDER — POTASSIUM CHLORIDE CRYS ER 20 MEQ PO TBCR
40.0000 meq | EXTENDED_RELEASE_TABLET | Freq: Once | ORAL | Status: AC
  Administered 2019-12-05: 40 meq via ORAL
  Filled 2019-12-05: qty 2

## 2019-12-05 NOTE — Evaluation (Signed)
Occupational Therapy Evaluation Patient Details Name: [name] MRN: [id number] DOB: Jun 07, 1923 Today's Date: 12/05/2019    History of Present Illness 86 yo female admitted with T12 compression fx after sustaining a fall at home. Hx of osteoporosis, spinal stenosis, R THA, neuropathy, polymyalgia rheumatica   Clinical Impression   Pt admitted with the above. Pt currently with functional limitations due to the deficits listed below (see OT Problem List).  Pt will benefit from skilled OT to increase their safety and independence with ADL and functional mobility for ADL to facilitate discharge to venue listed below.  Daughter not able to provide 24/7 A. Will need SNF.      Follow Up Recommendations  SNF    Equipment Recommendations  3 in 1 bedside commode    Recommendations for Other Services       Precautions / Restrictions Precautions Precautions: Fall Precaution Comments: back precautions/logroll for comfort Required Braces or Orthoses: Spinal Brace Spinal Brace: Thoracolumbosacral orthotic(pt already wearing in bed. provided education on when brace should be worn-when OOB) Restrictions Weight Bearing Restrictions: No      Mobility Bed Mobility Overal bed mobility: Needs Assistance Bed Mobility: Rolling;Sidelying to Sit;Sit to Sidelying Rolling: Min assist Sidelying to sit: Mod assist     Sit to sidelying: Mod assist General bed mobility comments: Assist for trunk and bil LEs. Increased time. Cues for safety, technique.  Transfers Overall transfer level: Needs assistance Equipment used: Rolling walker (2 wheeled) Transfers: Sit to/from Stand Sit to Stand: From elevated surface         General transfer comment: Assist to power up, stabilize, control descent. VCs safety, hand placement.    Balance Overall balance assessment: Needs assistance;History of Falls Sitting-balance support: Bilateral upper extremity supported Sitting balance-Leahy Scale: Fair      Standing balance support: Bilateral upper extremity supported Standing balance-Leahy Scale: Poor                             ADL either performed or assessed with clinical judgement   ADL Overall ADL's : Needs assistance/impaired Eating/Feeding: Bed level;Set up   Grooming: Wash/dry face;Oral care;Brushing hair;Bed level                                 General ADL Comments: Agreed to sit EOB but not able to unweight BUE to perform ADL task REturned to supine for rest of Eval     Vision Patient Visual Report: No change from baseline              Pertinent Vitals/Pain Pain Assessment: Faces Pain Score: 7  Faces Pain Scale: Hurts whole lot Pain Location: back Pain Descriptors / Indicators: Aching;Sore;Discomfort Pain Intervention(s): Monitored during session;Repositioned     Hand Dominance     Extremity/Trunk Assessment Upper Extremity Assessment Upper Extremity Assessment: Defer to OT evaluation   Lower Extremity Assessment Lower Extremity Assessment: Generalized weakness   Cervical / Trunk Assessment Cervical / Trunk Assessment: Kyphotic   Communication Communication Communication: HOH   Cognition Arousal/Alertness: Awake/alert Behavior During Therapy: WFL for tasks assessed/performed Overall Cognitive Status: Within Functional Limits for tasks assessed  Home Living Family/patient expects to be discharged to:: Private residence Living Arrangements: Alone Available Help at Discharge: Family;Personal care attendant Type of Home: House Home Access: Stairs to enter [employer] of Steps: 3 Entrance Stairs-Rails: Right;Left;Can reach both [place]: Able to live on main level with bedroom/bathroom               Home Equipment: Walker - 2 wheels;Cane - single point;Bedside commode          Prior Functioning/Environment Level of Independence: Needs  assistance  Gait / Transfers Assistance Needed: not using RW prior to admission ADL's / Homemaking Assistance Needed: has home health aide a few hours/day            OT Problem List: Decreased strength;Decreased activity tolerance;Pain;Decreased knowledge of precautions;Decreased safety awareness;Impaired balance (sitting and/or standing);Decreased knowledge of use of DME or AE      OT Treatment/Interventions: Self-care/ADL training;Patient/family education;DME and/or AE instruction;Therapeutic activities    OT Goals(Current goals can be found in the care plan section) Acute Rehab OT Goals Patient Stated Goal: less pain. home hopefully. OT Goal Formulation: With patient Time For Goal Achievement: 12/12/19 Potential to Achieve Goals: Good  OT Frequency: Min 2X/week   Barriers to D/C: Decreased caregiver support             AM-PAC OT "6 Clicks" Daily Activity     Outcome Measure Help from another person eating meals?: A Little Help from another person taking care of personal grooming?: A Little Help from another person toileting, which includes using toliet, bedpan, or urinal?: A Lot Help from another person bathing (including washing, rinsing, drying)?: A Lot Help from another person to put on and taking off regular upper body clothing?: A Lot Help from another person to put on and taking off regular lower body clothing?: Total 6 Click Score: 13   End of Session Equipment Utilized During Treatment: Back brace Nurse Communication: Mobility status  Activity Tolerance: Patient limited by pain Patient left: in bed;with bed alarm set  OT Visit Diagnosis: Unsteadiness on feet (R26.81);Other abnormalities of gait and mobility (R26.89);Repeated falls (R29.6);History of falling (Z91.81);Muscle weakness (generalized) (M62.81);Pain Pain - part of body: (back)                Time: [id number] OT Time Calculation (min): 18 min Charges:  OT General Charges $OT Visit: 1 Visit OT  Evaluation $OT Eval Moderate Complexity: 1 Mod  Kari Baars, OT Acute Rehabilitation Services [phone] Office- [phone], Edwena Felty D 12/05/2019, 2:49 PM

## 2019-12-05 NOTE — Evaluation (Signed)
Physical Therapy Evaluation Patient Details Name: [name] MRN: [id number] DOB: 1923-01-19 Today's Date: 12/05/2019   History of Present Illness  86 yo female admitted with T12 compression fx after sustaining a fall at home. Hx of osteoporosis, spinal stenosis, R THA, neuropathy, polymyalgia rheumatica  Clinical Impression  On eval, pt required Mod assist for mobility. She was able to stand and walk ~8 feet using a RW. Mobility is limited by pain presently. Moderate pain with activity on today. Discussed d/c plan-pt does not have 24/7 assist available at this time. Recommend ST SNF for rehab.     Follow Up Recommendations SNF    Equipment Recommendations  None recommended by PT    Recommendations for Other Services       Precautions / Restrictions Precautions Precautions: Fall Precaution Comments: back precautions/logroll for comfort Required Braces or Orthoses: Spinal Brace Spinal Brace: Thoracolumbosacral orthotic(pt already wearing in bed. provided education on when brace should be worn-when OOB) Restrictions Weight Bearing Restrictions: No      Mobility  Bed Mobility Overal bed mobility: Needs Assistance Bed Mobility: Rolling;Sidelying to Sit Rolling: Min assist Sidelying to sit: Mod assist       General bed mobility comments: Assist for trunk and bil LEs. Increased time. Cues for safety, technique.  Transfers Overall transfer level: Needs assistance Equipment used: Rolling walker (2 wheeled) Transfers: Sit to/from Stand Sit to Stand: Min assist;From elevated surface         General transfer comment: Assist to power up, stabilize, control descent. VCs safety, hand placement.  Ambulation/Gait Ambulation/Gait assistance: Min assist Gait Distance (Feet): 8 Feet Assistive device: Rolling walker (2 wheeled) Gait Pattern/deviations: Step-through pattern;Decreased stride length     General Gait Details: Distance limited by pain. Cues for posture, distanc from  RW. Followed closely with recliner.  Stairs            Wheelchair Mobility    Modified Rankin (Stroke Patients Only)       Balance Overall balance assessment: Needs assistance;History of Falls         Standing balance support: Bilateral upper extremity supported Standing balance-Leahy Scale: Poor                               Pertinent Vitals/Pain Pain Assessment: Faces Faces Pain Scale: Hurts whole lot Pain Location: back Pain Descriptors / Indicators: Aching;Sore;Discomfort Pain Intervention(s): Limited activity within patient's tolerance;Monitored during session;Repositioned    Home Living Family/patient expects to be discharged to:: Private residence Living Arrangements: Alone Available Help at Discharge: Family;Personal care attendant Type of Home: House Home Access: Stairs to enter Entrance Stairs-Rails: Right;Left;Can reach both Entrance Stairs-Number of Steps: 3 Home Layout: Able to live on main level with bedroom/bathroom Home Equipment: Walker - 2 wheels;Cane - single point;Bedside commode      Prior Function Level of Independence: Needs assistance   Gait / Transfers Assistance Needed: not using RW prior to admission  ADL's / Homemaking Assistance Needed: has home health aide a few hours/day        Hand Dominance        Extremity/Trunk Assessment   Upper Extremity Assessment Upper Extremity Assessment: Defer to OT evaluation    Lower Extremity Assessment Lower Extremity Assessment: Generalized weakness    Cervical / Trunk Assessment Cervical / Trunk Assessment: Kyphotic  Communication   Communication: HOH  Cognition Arousal/Alertness: Awake/alert Behavior During Therapy: WFL for tasks assessed/performed Overall Cognitive Status: Within Functional  Limits for tasks assessed                                        General Comments      Exercises     Assessment/Plan    PT Assessment Patient needs  continued PT services  PT Problem List Decreased strength;Decreased mobility;Decreased activity tolerance;Decreased balance;Pain;Decreased knowledge of use of DME       PT Treatment Interventions DME instruction;Gait training;Therapeutic activities;Therapeutic exercise;Patient/family education;Balance training;Functional mobility training    PT Goals (Current goals can be found in the Care Plan section)  Acute Rehab PT Goals Patient Stated Goal: less pain. home hopefully. PT Goal Formulation: With patient Time For Goal Achievement: 12/19/19 Potential to Achieve Goals: Good    Frequency Min 3X/week   Barriers to discharge Decreased caregiver support      Co-evaluation               AM-PAC PT "6 Clicks" Mobility  Outcome Measure Help needed turning from your back to your side while in a flat bed without using bedrails?: A Little Help needed moving from lying on your back to sitting on the side of a flat bed without using bedrails?: A Lot Help needed moving to and from a bed to a chair (including a wheelchair)?: A Little Help needed standing up from a chair using your arms (e.g., wheelchair or bedside chair)?: A Little Help needed to walk in hospital room?: A Little Help needed climbing 3-5 steps with a railing? : A Lot 6 Click Score: 16    End of Session Equipment Utilized During Treatment: Gait belt Activity Tolerance: Patient limited by fatigue;Patient limited by pain Patient left: in chair;with call bell/phone within reach   PT Visit Diagnosis: Muscle weakness (generalized) (M62.81);Pain;Difficulty in walking, not elsewhere classified (R26.2);History of falling (Z91.81) Pain - part of body: (back)    Time: [id number] PT Time Calculation (min) (ACUTE ONLY): 22 min   Charges:   PT Evaluation $PT Eval Moderate Complexity: 1 Mod            Johneisha Broaden P, PT Acute Rehabilitation

## 2019-12-05 NOTE — Progress Notes (Signed)
PROGRESS NOTE    [name]  [id number] DOB: 09-Mar-1923 DOA: 12/04/2019 PCP: Harrison Mons, PA     Brief Narrative:  [name] is a 86 y.o.WF PMHx  HTN, GERD, HLD.   Presents after a mechanical fall. She reports that while she was trying to move some packages from her porch, she fell back on to her butt and then rolled back on her back and her head. She remembers the entire fall. When she threw one of her packages, the weight of the package pulled her back to initiate the fall. She was unable to get up on her own, so she called EMS. She was on the ground for about 20 or so minutes before she was assisted up. No head injury was noted and she refused to go to the ED. She denied any pain at the time and stayed home. Saturday she began having pain at her flanks and tried her best to ignore it. It was worse with moving. Nothing relieved it. She tried to stay in bed and not move, but the pain has progressed. Her daughter insisted that she go to the ED today, so she did.   ED Course: At [facility] she was evaluated. A CT of the ab/pelvis revealed an acute T12 fracture. TRH at [facility] was called for admission.   Review of Systems: Reports back pain worse with movement. Denies numbness, tingling, LOC, incontinence.  Remainder of 10 point review of systems is otherwise negative for all not mentioned in    Subjective: A/O x4, negative CP, negative S OB, negative abdominal pain.  States miserable in her lower back and left leg.  Currently sitting in chair with her TLSO brace on.  States lives alone but has aides that come in frequently and her children also a assist her.  States she ended her self when she fell backwards into the house.   Assessment & Plan:   Active Problems:   Compression fracture of body of thoracic vertebra (HCC)   Spinal fracture of T12 vertebra (HCC)  Acute fracture T12/mechanical fall -Neurosurgery consulted nonoperable fracture. -TLSO provided -PT/OT consult pending -Pain  management.  Essential HTN -Losartan 50 mg daily (hold) secondary to hypotension.  Given patient's recent fall this may have contributed -Unless patient's BP climb significantly off losartan would not restart.  HLD -Simvastatin 20 mg daily  Depression/anxiety -Lexapro 5 mg daily -Remeron 15 mg daily  Neuropathy -Gabapentin 300 mg BID  AKI vs CKD -Last creatinine on file 0.63 taken 02/16/2016 therefore unsure of true baseline -Monitor renal function carefully -Limit nephrotoxic medication Recent Labs  Lab 12/04/19 1047 12/05/19 0251  CREATININE 1.86* 1.55*  -Strict in and out -Daily weight -Normal saline 21ml/hr  Hyponatremia -Resolved  Leukocytosis -Most likely reactive secondary to her fracture -Negative fever, negative band shift.  Will monitor closely  GERD     - continue PPI     DVT prophylaxis: Lovenox Code Status: DNR Family Communication:  Disposition Plan: TBD Status is: Inpatient    Dispo: The patient is from: Home              Anticipated d/c is to: SNF              Anticipated d/c date is: May 27              Patient currently unstable      Consultants:  Neurosurgery   Procedures/Significant Events:    I have personally reviewed and interpreted all radiology studies  and my findings are as above.  VENTILATOR SETTINGS:    Cultures   Antimicrobials:    Devices    LINES / TUBES:      Continuous Infusions: . sodium chloride 75 mL/hr at 12/05/19 1005     Objective: Vitals:   12/04/19 2032 12/04/19 2128 12/05/19 0211 12/05/19 0517  BP: 126/75  (!) 145/103 109/65  Pulse: 67  (!) 56 (!) 52  Resp: 14  17 14   Temp: 97.8 F (36.6 C)  (!) 97.4 F (36.3 C) (!) 97.5 F (36.4 C)  TempSrc: Oral  Oral Oral  SpO2: (!) 89% 94% 98% 97%  Weight:      Height:        Intake/Output Summary (Last 24 hours) at 12/05/2019 1137 Last data filed at 12/05/2019 0900 Gross per 24 hour  Intake 2518.78 ml  Output 100 ml  Net  2418.78 ml   Filed Weights   12/04/19 0949  Weight: 72.6 kg    Examination:  General: A/O x4, no acute respiratory distress, cachectic Eyes: negative scleral hemorrhage, negative anisocoria, negative icterus ENT: Negative Runny nose, negative gingival bleeding, Neck:  Negative scars, masses, torticollis, lymphadenopathy, JVD Lungs: Clear to auscultation bilaterally without wheezes or crackles Cardiovascular: Regular rate and rhythm without murmur gallop or rub normal S1 and S2 Abdomen: negative abdominal pain, nondistended, positive soft, bowel sounds, no rebound, no ascites, no appreciable mass Extremities: No significant cyanosis, clubbing, or edema bilateral lower extremities.  Currently sitting in chair with TLSO brace on positive pain to movement. Skin: Negative rashes, lesions, ulcers Psychiatric:  Negative depression, negative anxiety, negative fatigue, negative mania  Central nervous system:  Cranial nerves II through XII intact, tongue/uvula midline, all extremities muscle strength 5/5, sensation intact throughout, negative dysarthria, negative expressive aphasia, negative receptive aphasia.  .     Data Reviewed: Care during the described time interval was provided by me .  I have reviewed this patient's available data, including medical history, events of note, physical examination, and all test results as part of my evaluation.  CBC: Recent Labs  Lab 12/04/19 1047 12/05/19 0251  WBC 15.8* 10.9*  HGB 12.6 9.6*  HCT 39.8 30.9*  MCV 92.8 94.8  PLT 299 [id number]   Basic Metabolic Panel: Recent Labs  Lab 12/04/19 1047 12/05/19 0251  NA 133* 136  K 3.7 3.3*  CL 91* 97*  CO2 26 28  GLUCOSE 159* 141*  BUN 25* 34*  CREATININE 1.86* 1.55*  CALCIUM 9.2 8.2*   GFR: Estimated Creatinine Clearance: 21.7 mL/min (A) (by C-G formula based on SCr of 1.55 mg/dL (H)). Liver Function Tests: Recent Labs  Lab 12/05/19 0251  AST 38  ALT 18  ALKPHOS 86  BILITOT 0.4  PROT  5.8*  ALBUMIN 2.8*   No results for input(s): LIPASE, AMYLASE in the last 168 hours. No results for input(s): AMMONIA in the last 168 hours. Coagulation Profile: No results for input(s): INR, PROTIME in the last 168 hours. Cardiac Enzymes: No results for input(s): CKTOTAL, CKMB, CKMBINDEX, TROPONINI in the last 168 hours. BNP (last 3 results) No results for input(s): PROBNP in the last 8760 hours. HbA1C: No results for input(s): HGBA1C in the last 72 hours. CBG: No results for input(s): GLUCAP in the last 168 hours. Lipid Profile: No results for input(s): CHOL, HDL, LDLCALC, TRIG, CHOLHDL, LDLDIRECT in the last 72 hours. Thyroid Function Tests: No results for input(s): TSH, T4TOTAL, FREET4, T3FREE, THYROIDAB in the last 72 hours. Anemia Panel: No results for  input(s): VITAMINB12, FOLATE, FERRITIN, TIBC, IRON, RETICCTPCT in the last 72 hours. Sepsis Labs: No results for input(s): PROCALCITON, LATICACIDVEN in the last 168 hours.  Recent Results (from the past 240 hour(s))  SARS Coronavirus 2 by RT PCR (hospital order, performed in [facility] hospital lab) Nasopharyngeal Nasopharyngeal Swab     Status: None   Collection Time: 12/04/19  2:49 PM   Specimen: Nasopharyngeal Swab  Result Value Ref Range Status   SARS Coronavirus 2 NEGATIVE NEGATIVE Final    Comment: (NOTE) SARS-CoV-2 target nucleic acids are NOT DETECTED. The SARS-CoV-2 RNA is generally detectable in upper and lower respiratory specimens during the acute phase of infection. The lowest concentration of SARS-CoV-2 viral copies this assay can detect is 250 copies / mL. A negative result does not preclude SARS-CoV-2 infection and should not be used as the sole basis for treatment or other patient management decisions.  A negative result may occur with improper specimen collection / handling, submission of specimen other than nasopharyngeal swab, presence of viral mutation(s) within the areas targeted by this assay, and  inadequate number of viral copies (<250 copies / mL). A negative result must be combined with clinical observations, patient history, and epidemiological information. Fact Sheet for Patients:   StrictlyIdeas.no Fact Sheet for Healthcare Providers: BankingDealers.co.za This test is not yet approved or cleared  by the [place] FDA and has been authorized for detection and/or diagnosis of SARS-CoV-2 by FDA under an Emergency Use Authorization (EUA).  This EUA will remain in effect (meaning this test can be used) for the duration of the COVID-19 declaration under Section 564(b)(1) of the Act, 21 U.S.C. section 360bbb-3(b)(1), unless the authorization is terminated or revoked sooner. Performed at [facility], [address] [place], [place] [address]          Radiology Studies: CT ABDOMEN PELVIS WO CONTRAST  Result Date: 12/04/2019 CLINICAL DATA:  Abdominal trauma. Flank pain. EXAM: CT ABDOMEN AND PELVIS WITHOUT CONTRAST TECHNIQUE: Multidetector CT imaging of the abdomen and pelvis was performed following the standard protocol without IV contrast. COMPARISON:  02/15/2016. FINDINGS: Lower chest: Peripheral predominant interstitial reticulation is noted in both lower lobes. No acute abnormality. Hepatobiliary: Within the limitations of unenhanced technique there is no evidence for up attic injury or perihepatic hematoma. Benign calcification noted within the dome of liver measuring 7 mm. Small low attenuation structure within posterior right hepatic lobe measures 5 mm and is too small to characterize. The gallbladder is unremarkable. No biliary dilatation. Pancreas: Unremarkable. No pancreatic ductal dilatation or surrounding inflammatory changes. Spleen: No splenic injury or perisplenic hematoma. Adrenals/Urinary Tract: No adrenal hemorrhage or renal injury identified. Bladder is unremarkable. Stomach/Bowel: Small hiatal hernia.  Stomach nondistended. No evidence of bowel wall thickening, distention, or inflammatory changes. Predominantly is left-sided colonic diverticulosis noted without acute inflammation. Vascular/Lymphatic: Aortic atherosclerosis. No aneurysm. No abdominopelvic adenopathy. Reproductive: Uterus and bilateral adnexa are unremarkable. Other: No abdominal wall hernia or abnormality. No abdominopelvic ascites. Musculoskeletal: Acute fracture deformity is identified involving the T12 vertebra with approximately 4 mm of retropulsion of fracture fragments. There is loss of approximately 50% of the vertebral body height. The remaining lumbar vertebral body heights are well preserved. Multi level degenerative disc disease identified within the visualized thoracolumbar spine. Bilateral facet degenerative change noted at L4-5 and L5-S1. Previous right hip arthroplasty. No additional fractures identified. IMPRESSION: 1. Acute fracture deformity involving the T12 vertebra with approximately 4 mm of retropulsion of fracture fragments. 2. Aortic atherosclerosis. 3.  Left-sided colonic diverticulosis without acute inflammation. Aortic Atherosclerosis (ICD10-I70.0). Electronically Signed   By: Kerby Moors M.D.   On: 12/04/2019 12:50   CT L-SPINE NO CHARGE  Result Date: 12/04/2019 CLINICAL DATA:  [place] 2 days ago at home.  Low back pain. EXAM: CT LUMBAR SPINE WITHOUT CONTRAST TECHNIQUE: Multidetector CT imaging of the lumbar spine was performed without intravenous contrast administration. Multiplanar CT image reconstructions were also generated. COMPARISON:  02/14/2016 FINDINGS: Segmentation: 5 lumbar type vertebral bodies. Alignment: 2 mm degenerative anterolisthesis L4-5 and L5-S1. Vertebrae: Acute compression fracture of the T12 vertebral body with loss of height of 50%. Posterior bowing of the posterior margin of the vertebral body by 5 mm encroaches upon the spinal canal. No posterior element fracture. Paraspinal and other soft  tissues: See results of abdominal CT. Disc levels: No significant disc space finding at T11-12, T12-L1 or L1-2. L2-3: Disc bulge. Facet and ligamentous hypertrophy. Moderate stenosis. L3-4: Disc bulge.  Facet hypertrophy.  Moderate stenosis. L4-5: Facet arthropathy with 2 mm of anterolisthesis. Disc bulge. Severe multifactorial stenosis. L5-S1: Facet arthropathy with 2 mm of anterolisthesis. Disc bulge. Mild stenosis of the lateral recesses and foramina. IMPRESSION: Acute compression fracture at T12 with loss of height of 50%. Posterior bowing of the posterosuperior margin of the vertebral body by 5 mm encroaches upon the spinal canal. No sign that this is anything other than a benign fracture. Lower lumbar degenerative disc disease and degenerative facet disease. Severe multifactorial spinal stenosis at L4-5. Moderate stenosis at L2-3 and L3-4. Lateral recess and foraminal stenosis at L5-S1. Electronically Signed   By: Nelson Chimes M.D.   On: 12/04/2019 12:31        Scheduled Meds: . cholecalciferol  2,000 Units Oral Daily  . enoxaparin (LOVENOX) injection  30 mg Subcutaneous Q24H  . escitalopram  5 mg Oral Daily  . gabapentin  300 mg Oral BID  . losartan  50 mg Oral Daily  . meloxicam  7.5 mg Oral BID  . mirtazapine  15 mg Oral QHS  . pantoprazole  40 mg Oral Daily  . simvastatin  20 mg Oral QPM   Continuous Infusions: . sodium chloride 75 mL/hr at 12/05/19 1005     LOS: 1 day    Time spent:40 min    Tyjai Matuszak, Geraldo Docker, MD [facility] Pager [phone]  If 7PM-7AM, please contact night-coverage [email or web address] Password [facility] 12/05/2019, 11:37 AM

## 2019-12-05 NOTE — TOC Initial Note (Signed)
Transition of Care [facility]) - Initial/Assessment Note    Patient Details  Name: [name] MRN: [id number] Date of Birth: 08/04/1922  Transition of Care (TOC) CM/SW Contact:    Joaquin Courts, RN Phone Number: 12/05/2019, 1:45 PM  Clinical Narrative:           CM spoke with patient at bedside regarding PT recommendation for SNF.  Patient is in agreement to explore SNF options.  FL2 faxed out and Nav[employer] authorization initiated (ref # [id number]).  Patient shared with CM that she was missing some clothing items that she last saw before being brought over by [facility].  CM spoke with charge RN Danna regarding this.  Charge RN to follow-up.         Expected Discharge Plan: Skilled Nursing Facility Barriers to Discharge: Continued Medical Work up   Patient Goals and CMS Choice Patient states their goals for this hospitalization and ongoing recovery are:: to go to rehab CMS Medicare.gov Compare Post Acute Care list provided to:: Patient Choice offered to / list presented to : Patient  Expected Discharge Plan and Services Expected Discharge Plan: [place]   Discharge Planning Services: CM Consult Post Acute Care Choice: [place] Living arrangements for the past 2 months: Single Family Home                                      Prior Living Arrangements/Services Living arrangements for the past 2 months: Single Family Home Lives with:: Self Patient language and need for interpreter reviewed:: Yes Do you feel safe going back to the place where you live?: Yes      Need for Family Participation in Patient Care: No (Comment) Care giver support system in place?: Yes (comment)   Criminal Activity/Legal Involvement Pertinent to Current Situation/Hospitalization: No - Comment as needed  Activities of Daily Living Home Assistive Devices/Equipment: Environmental consultant (specify type), Cane (specify quad or straight), Eyeglasses, Hearing aid, Grab  bars in shower ADL Screening (condition at time of admission) Patient's cognitive ability adequate to safely complete daily activities?: No Is the patient deaf or have difficulty hearing?: Yes Does the patient have difficulty seeing, even when wearing glasses/contacts?: No Does the patient have difficulty concentrating, remembering, or making decisions?: No Patient able to express need for assistance with ADLs?: Yes Does the patient have difficulty dressing or bathing?: Yes Independently performs ADLs?: No Does the patient have difficulty walking or climbing stairs?: Yes Weakness of Legs: None Weakness of Arms/Hands: None  Permission Sought/Granted                  Emotional Assessment Appearance:: Appears stated age Attitude/Demeanor/Rapport: Engaged Affect (typically observed): Accepting Orientation: : Oriented to Self, Oriented to Place, Oriented to  Time, Oriented to Situation   Psych Involvement: No (comment)  Admission diagnosis:  Fall [W19.XXXA] Acute kidney injury ([place]) [N17.9] Compression fracture of T12 vertebra, initial encounter ([place]) [id number] Compression fracture of body of thoracic vertebra ([place]) [S22.000A] Spinal fracture of T12 vertebra ([place]) [id number] Patient Active Problem List   Diagnosis Date Noted  . Compression fracture of body of thoracic vertebra ([place]) 12/04/2019  . Spinal fracture of T12 vertebra ([place]) 12/04/2019  . Fever, unspecified 02/15/2016  . Anxiety 02/15/2016  . Anemia 02/15/2016  . Hyponatremia 02/15/2016  . Bradycardia   . Constipation   . Depression   . MDD (major depressive disorder),  recurrent severe, without psychosis ([place])   . Symptomatic bradycardia 01/30/2016  . Symptomatic anemia 01/29/2016  . Hypertension   . Osteoporosis   . Hyperlipidemia   . GERD (gastroesophageal reflux disease)   . Spinal stenosis   . Hearing loss 12/08/2011  . Osteoarthritis of hip 12/02/2011  . Endometriosis   . DIVERTICULOSIS-COLON  02/12/2009  . ALLERGIC RHINITIS 01/19/2007  . ESOPHAGEAL STRICTURE 01/19/2007   PCP:  Harrison Mons, [place] Pharmacy:   [id number] - [place], [place] - [place] [address] [address] Phone: [phone] Fax: [phone]     Social Determinants of Health (SDOH) Interventions    Readmission Risk Interventions No flowsheet data found.

## 2019-12-05 NOTE — NC FL2 (Signed)
[place] LEVEL OF CARE SCREENING TOOL     IDENTIFICATION  Patient Name: [name] Birthdate: [birth year] Sex: female Admission Date (Current Location): 12/04/2019  [facility] and [place] Number:  Herbalist and Address:  [facility],  [place] [address], [place]      Provider Number: [id number]  Attending Physician Name and Address:  Oswald Hillock, MD  Relative Name and Phone Number:       Current Level of Care: Hospital Recommended Level of Care: [place] Prior Approval Number:    Date Approved/Denied:   PASRR Number: [id number]  Discharge Plan: SNF    Current Diagnoses: Patient Active Problem List   Diagnosis Date Noted  . Compression fracture of body of thoracic vertebra ([place]) 12/04/2019  . Spinal fracture of T12 vertebra ([place]) 12/04/2019  . Fever, unspecified 02/15/2016  . Anxiety 02/15/2016  . Anemia 02/15/2016  . Hyponatremia 02/15/2016  . Bradycardia   . Constipation   . Depression   . MDD (major depressive disorder), recurrent severe, without psychosis ([place])   . Symptomatic bradycardia 01/30/2016  . Symptomatic anemia 01/29/2016  . Hypertension   . Osteoporosis   . Hyperlipidemia   . GERD (gastroesophageal reflux disease)   . Spinal stenosis   . Hearing loss 12/08/2011  . Osteoarthritis of hip 12/02/2011  . Endometriosis   . DIVERTICULOSIS-COLON 02/12/2009  . ALLERGIC RHINITIS 01/19/2007  . ESOPHAGEAL STRICTURE 01/19/2007    Orientation RESPIRATION BLADDER Height & Weight     Self, Time, Situation, Place  Normal Continent Weight: 72.6 kg Height:  5\' 4"  (162.6 cm)  BEHAVIORAL SYMPTOMS/MOOD NEUROLOGICAL BOWEL NUTRITION STATUS      Continent Diet  AMBULATORY STATUS COMMUNICATION OF NEEDS Skin   Extensive Assist   Normal                       Personal Care Assistance Level of Assistance  Bathing, Dressing Bathing Assistance: Maximum assistance   Dressing Assistance: Maximum  assistance     Functional Limitations Info             SPECIAL CARE FACTORS FREQUENCY  PT (By licensed PT), OT (By licensed OT)     PT Frequency: 5x weekly OT Frequency: 5x weekly            Contractures Contractures Info: Not present    Additional Factors Info  Code Status, Allergies Code Status Info: DNR Allergies Info: Cartia Xt, Codeine, Lisinopril-hydrochlorothiazide, Mercury, Sulfa Antibiotics, Sulfonamide Derivatives           Current Medications (12/05/2019):  This is the current hospital active medication list Current Facility-Administered Medications  Medication Dose Route Frequency Provider Last Rate Last Admin  . 0.9 %  sodium chloride infusion   Intravenous Continuous Kyle, Tyrone A, DO 75 mL/hr at 12/05/19 1005 New Bag at 12/05/19 1005  . acetaminophen (TYLENOL) tablet 650 mg  650 mg Oral Q4H PRN Marylyn Ishihara, Tyrone A, DO   650 mg at 12/05/19 0956  . cholecalciferol (VITAMIN D3) tablet 2,000 Units  2,000 Units Oral Daily Kyle, Tyrone A, DO   2,000 Units at 12/05/19 0956  . docusate sodium (COLACE) capsule 200 mg  200 mg Oral BID PRN Marylyn Ishihara, Tyrone A, DO      . enoxaparin (LOVENOX) injection 30 mg  30 mg Subcutaneous Q24H Kyle, Tyrone A, DO   30 mg at 12/04/19 2120  . escitalopram (LEXAPRO) tablet 5 mg  5 mg Oral Daily  Cherylann Ratel A, DO   5 mg at 12/05/19 0956  . fentaNYL (SUBLIMAZE) injection 12.5 mcg  12.5 mcg Intravenous Q3H PRN Allie Bossier, MD      . gabapentin (NEURONTIN) capsule 300 mg  300 mg Oral BID Marylyn Ishihara, Tyrone A, DO   300 mg at 12/05/19 0957  . losartan (COZAAR) tablet 50 mg  50 mg Oral Daily Kyle, Tyrone A, DO   50 mg at 12/05/19 0957  . meloxicam (MOBIC) tablet 7.5 mg  7.5 mg Oral BID Marylyn Ishihara, Tyrone A, DO   7.5 mg at 12/05/19 0956  . mirtazapine (REMERON) tablet 15 mg  15 mg Oral QHS Kyle, Tyrone A, DO   15 mg at 12/04/19 2120  . ondansetron (ZOFRAN) tablet 4 mg  4 mg Oral Q6H PRN Marylyn Ishihara, Tyrone A, DO       Or  . ondansetron (ZOFRAN) injection 4 mg  4  mg Intravenous Q6H PRN Marylyn Ishihara, Tyrone A, DO      . pantoprazole (PROTONIX) EC tablet 40 mg  40 mg Oral Daily Kyle, Tyrone A, DO   40 mg at 12/05/19 0956  . polyvinyl alcohol (LIQUIFILM TEARS) 1.4 % ophthalmic solution 1-2 drop  1-2 drop Both Eyes PRN Merlene Laughter F, NP   2 drop at 12/05/19 0958  . simvastatin (ZOCOR) tablet 20 mg  20 mg Oral QPM Kyle, Tyrone A, DO   20 mg at 12/04/19 2121  . traMADol (ULTRAM) tablet 100 mg  100 mg Oral Q12H PRN Marylyn Ishihara, Tyrone A, DO   100 mg at 12/05/19 [id number]     Discharge Medications: Please see discharge summary for a list of discharge medications.  Relevant Imaging Results:  Relevant Lab Results:   Additional Information SSN [id number]  Joaquin Courts, RN

## 2019-12-05 NOTE — Progress Notes (Signed)
Orthopedic Tech Progress Note Patient Details:  [name] 25-Apr-1923 [id number] Spoke with RN Anderson Malta and she confirmed patient has on brace.  Patient ID: [name], female   DOB: 07-Feb-1923, 86 y.o.   MRN: [id number]   Staci Righter 12/05/2019, 9:19 AM

## 2019-12-06 LAB — COMPREHENSIVE METABOLIC PANEL
ALT: 19 U/L (ref 0–44)
AST: 28 U/L (ref 15–41)
Albumin: 2.8 g/dL — ABNORMAL LOW (ref 3.5–5.0)
Alkaline Phosphatase: 118 U/L (ref 38–126)
Anion gap: 9 (ref 5–15)
BUN: 31 mg/dL — ABNORMAL HIGH (ref 8–23)
CO2: 24 mmol/L (ref 22–32)
Calcium: 8.3 mg/dL — ABNORMAL LOW (ref 8.9–10.3)
Chloride: 99 mmol/L (ref 98–111)
Creatinine, Ser: 0.97 mg/dL (ref 0.44–1.00)
GFR calc Af Amer: 58 mL/min — ABNORMAL LOW (ref >60)
GFR calc non Af Amer: 50 mL/min — ABNORMAL LOW (ref >60)
Glucose, Bld: 110 mg/dL — ABNORMAL HIGH (ref 70–99)
Potassium: 4.2 mmol/L (ref 3.5–5.1)
Sodium: 132 mmol/L — ABNORMAL LOW (ref 135–145)
Total Bilirubin: 0.5 mg/dL (ref 0.3–1.2)
Total Protein: 6 g/dL — ABNORMAL LOW (ref 6.5–8.1)

## 2019-12-06 LAB — CBC WITH DIFFERENTIAL/PLATELET
Abs Immature Granulocytes: 0.06 10*3/uL (ref 0.00–0.07)
Basophils Absolute: 0.1 10*3/uL (ref 0.0–0.1)
Basophils Relative: 1 %
Eosinophils Absolute: 0.7 10*3/uL — ABNORMAL HIGH (ref 0.0–0.5)
Eosinophils Relative: 6 %
HCT: 32.3 % — ABNORMAL LOW (ref 36.0–46.0)
Hemoglobin: 10 g/dL — ABNORMAL LOW (ref 12.0–15.0)
Immature Granulocytes: 1 %
Lymphocytes Relative: 12 %
Lymphs Abs: 1.3 10*3/uL (ref 0.7–4.0)
MCH: 29.5 pg (ref 26.0–34.0)
MCHC: 31 g/dL (ref 30.0–36.0)
MCV: 95.3 fL (ref 80.0–100.0)
Monocytes Absolute: 0.9 10*3/uL (ref 0.1–1.0)
Monocytes Relative: 8 %
Neutro Abs: 7.9 10*3/uL — ABNORMAL HIGH (ref 1.7–7.7)
Neutrophils Relative %: 72 %
Platelets: 192 10*3/uL (ref 150–400)
RBC: 3.39 MIL/uL — ABNORMAL LOW (ref 3.87–5.11)
RDW: 12.2 % (ref 11.5–15.5)
WBC: 11 10*3/uL — ABNORMAL HIGH (ref 4.0–10.5)
nRBC: 0 % (ref 0.0–0.2)

## 2019-12-06 LAB — MAGNESIUM: Magnesium: 1.7 mg/dL (ref 1.7–2.4)

## 2019-12-06 LAB — PHOSPHORUS: Phosphorus: 3.7 mg/dL (ref 2.5–4.6)

## 2019-12-06 MED ORDER — LOSARTAN POTASSIUM 50 MG PO TABS
50.0000 mg | ORAL_TABLET | Freq: Every day | ORAL | Status: DC
  Administered 2019-12-06 – 2019-12-09 (×4): 50 mg via ORAL
  Filled 2019-12-06 (×4): qty 1

## 2019-12-06 MED ORDER — HYDRALAZINE HCL 10 MG PO TABS
10.0000 mg | ORAL_TABLET | Freq: Three times a day (TID) | ORAL | Status: DC
  Administered 2019-12-06 – 2019-12-07 (×3): 10 mg via ORAL
  Filled 2019-12-06 (×3): qty 1

## 2019-12-06 MED ORDER — ENOXAPARIN SODIUM 40 MG/0.4ML ~~LOC~~ SOLN: 40.0000 mg | SUBCUTANEOUS | Status: DC

## 2019-12-06 MED ORDER — HYDRALAZINE HCL 20 MG/ML IJ SOLN
5.0000 mg | Freq: Once | INTRAMUSCULAR | Status: AC
  Administered 2019-12-06: 5 mg via INTRAVENOUS
  Filled 2019-12-06: qty 1

## 2019-12-06 NOTE — Progress Notes (Addendum)
PROGRESS NOTE    [name]  [id number] DOB: 1922-09-01 DOA: 12/04/2019 PCP: Harrison Mons, PA     Brief Narrative:  [name] is a 86 y.o.WF PMHx  HTN, GERD, HLD.   Presents after a mechanical fall. She reports that while she was trying to move some packages from her porch, she fell back on to her butt and then rolled back on her back and her head. She remembers the entire fall. When she threw one of her packages, the weight of the package pulled her back to initiate the fall. She was unable to get up on her own, so she called EMS. She was on the ground for about 20 or so minutes before she was assisted up. No head injury was noted and she refused to go to the ED. She denied any pain at the time and stayed home. Saturday she began having pain at her flanks and tried her best to ignore it. It was worse with moving. Nothing relieved it. She tried to stay in bed and not move, but the pain has progressed. Her daughter insisted that she go to the ED today, so she did.   ED Course: At [facility] she was evaluated. A CT of the ab/pelvis revealed an acute T12 fracture. TRH at [facility] was called for admission.   Review of Systems: Reports back pain worse with movement. Denies numbness, tingling, LOC, incontinence.  Remainder of 10 point review of systems is otherwise negative for all not mentioned in    Subjective: 5/18 A/O x4, negative CP, negative S OB, negative abdominal pain.  Somehow patient got into her mind that she was going to have a kyphoplasty performed.  I informed her that nowhere in the surgeons notes could we find reference to kyphoplasty procedure.  Assessment & Plan:   Active Problems:   Compression fracture of body of thoracic vertebra (HCC)   Spinal fracture of T12 vertebra (HCC)  Acute fracture T12/mechanical fall -Neurosurgery consulted nonoperable fracture. -TLSO provided -PT/OT recommend SNF -Pain management.  Essential HTN -Losartan 50 mg daily (hold) secondary to  hypotension.  Given patient's recent fall this may have contributed -.5/18 restart losartan 50 mg daily -5/18 hydralazine 10 mg TID  HLD -Simvastatin 20 mg daily  Depression/anxiety -Lexapro 5 mg daily -Remeron 15 mg daily  Neuropathy -Gabapentin 300 mg BID  AKI vs CKD -Last creatinine on file 0.63 taken 02/16/2016 therefore unsure of true baseline -Monitor renal function carefully -Limit nephrotoxic medication Recent Labs  Lab 12/04/19 1047 12/05/19 0251 12/06/19 0310  CREATININE 1.86* 1.55* 0.97  -Strict in and out -Daily weight -Normal saline 62ml/hr  Hyponatremia -Slight hyponatremia today.  Asymptomatic.  Leukocytosis -Most likely reactive secondary to her fracture -Negative fever, negative band shift.  Will monitor closely  GERD     - continue PPI     DVT prophylaxis: Lovenox Code Status: DNR Family Communication: 5/18 spoke at length with Moreen Fowler (daughter) explained that there would be no surgery.  Also talked at length concerning what could be expected from rehabilitation.  Answered all questions. Disposition Plan: TBD Status is: Inpatient    Dispo: The patient is from: Home              Anticipated d/c is to: SNF              Anticipated d/c date is: May 27              Patient currently unstable  Consultants:  Neurosurgery   Procedures/Significant Events:    I have personally reviewed and interpreted all radiology studies and my findings are as above.  VENTILATOR SETTINGS:    Cultures   Antimicrobials:    Devices    LINES / TUBES:      Continuous Infusions: . sodium chloride 75 mL/hr at 12/06/19 0600     Objective: Vitals:   12/05/19 2103 12/06/19 0500 12/06/19 0953 12/06/19 0954  BP: (!) 147/89 (!) 190/92 (!) 198/92 (!) 181/90  Pulse: 60 65 70 70  Resp: 16 16 17    Temp: 98 F (36.7 C) 97.9 F (36.6 C) 97.8 F (36.6 C)   TempSrc: Oral Oral Oral   SpO2: 100% 96% 98% 96%  Weight:      Height:         Intake/Output Summary (Last 24 hours) at 12/06/2019 1107 Last data filed at 12/06/2019 0900 Gross per 24 hour  Intake 2311.3 ml  Output 2000 ml  Net 311.3 ml   Filed Weights   12/04/19 0949  Weight: 72.6 kg    Examination:  General: A/O x4, no acute respiratory distress, cachectic Eyes: negative scleral hemorrhage, negative anisocoria, negative icterus ENT: Negative Runny nose, negative gingival bleeding, Neck:  Negative scars, masses, torticollis, lymphadenopathy, JVD Lungs: Clear to auscultation bilaterally without wheezes or crackles Cardiovascular: Regular rate and rhythm without murmur gallop or rub normal S1 and S2 Abdomen: negative abdominal pain, nondistended, positive soft, bowel sounds, no rebound, no ascites, no appreciable mass Extremities: No significant cyanosis, clubbing, or edema bilateral lower extremities.  Currently sitting in chair with TLSO brace on positive pain to movement. Skin: Negative rashes, lesions, ulcers Psychiatric:  Negative depression, negative anxiety, negative fatigue, negative mania  Central nervous system:  Cranial nerves II through XII intact, tongue/uvula midline, all extremities muscle strength 5/5, sensation intact throughout, negative dysarthria, negative expressive aphasia, negative receptive aphasia.  .     Data Reviewed: Care during the described time interval was provided by me .  I have reviewed this patient's available data, including medical history, events of note, physical examination, and all test results as part of my evaluation.  CBC: Recent Labs  Lab 12/04/19 1047 12/05/19 0251 12/06/19 0310  WBC 15.8* 10.9* 11.0*  NEUTROABS  --   --  7.9*  HGB 12.6 9.6* 10.0*  HCT 39.8 30.9* 32.3*  MCV 92.8 94.8 95.3  PLT 299 216 [id number]   Basic Metabolic Panel: Recent Labs  Lab 12/04/19 1047 12/05/19 0251 12/06/19 0310  NA 133* 136 132*  K 3.7 3.3* 4.2  CL 91* 97* 99  CO2 26 28 24   GLUCOSE 159* 141* 110*  BUN 25* 34*  31*  CREATININE 1.86* 1.55* 0.97  CALCIUM 9.2 8.2* 8.3*  MG  --   --  1.7  PHOS  --   --  3.7   GFR: Estimated Creatinine Clearance: 34.7 mL/min (by C-G formula based on SCr of 0.97 mg/dL). Liver Function Tests: Recent Labs  Lab 12/05/19 0251 12/06/19 0310  AST 38 28  ALT 18 19  ALKPHOS 86 118  BILITOT 0.4 0.5  PROT 5.8* 6.0*  ALBUMIN 2.8* 2.8*   No results for input(s): LIPASE, AMYLASE in the last 168 hours. No results for input(s): AMMONIA in the last 168 hours. Coagulation Profile: No results for input(s): INR, PROTIME in the last 168 hours. Cardiac Enzymes: No results for input(s): CKTOTAL, CKMB, CKMBINDEX, TROPONINI in the last 168 hours. BNP (last 3 results) No results for input(s):  PROBNP in the last 8760 hours. HbA1C: No results for input(s): HGBA1C in the last 72 hours. CBG: No results for input(s): GLUCAP in the last 168 hours. Lipid Profile: No results for input(s): CHOL, HDL, LDLCALC, TRIG, CHOLHDL, LDLDIRECT in the last 72 hours. Thyroid Function Tests: No results for input(s): TSH, T4TOTAL, FREET4, T3FREE, THYROIDAB in the last 72 hours. Anemia Panel: No results for input(s): VITAMINB12, FOLATE, FERRITIN, TIBC, IRON, RETICCTPCT in the last 72 hours. Sepsis Labs: No results for input(s): PROCALCITON, LATICACIDVEN in the last 168 hours.  Recent Results (from the past 240 hour(s))  SARS Coronavirus 2 by RT PCR (hospital order, performed in [facility] hospital lab) Nasopharyngeal Nasopharyngeal Swab     Status: None   Collection Time: 12/04/19  2:49 PM   Specimen: Nasopharyngeal Swab  Result Value Ref Range Status   SARS Coronavirus 2 NEGATIVE NEGATIVE Final    Comment: (NOTE) SARS-CoV-2 target nucleic acids are NOT DETECTED. The SARS-CoV-2 RNA is generally detectable in upper and lower respiratory specimens during the acute phase of infection. The lowest concentration of SARS-CoV-2 viral copies this assay can detect is 250 copies / mL. A negative result  does not preclude SARS-CoV-2 infection and should not be used as the sole basis for treatment or other patient management decisions.  A negative result may occur with improper specimen collection / handling, submission of specimen other than nasopharyngeal swab, presence of viral mutation(s) within the areas targeted by this assay, and inadequate number of viral copies (<250 copies / mL). A negative result must be combined with clinical observations, patient history, and epidemiological information. Fact Sheet for Patients:   StrictlyIdeas.no Fact Sheet for Healthcare Providers: BankingDealers.co.za This test is not yet approved or cleared  by the [place] FDA and has been authorized for detection and/or diagnosis of SARS-CoV-2 by FDA under an Emergency Use Authorization (EUA).  This EUA will remain in effect (meaning this test can be used) for the duration of the COVID-19 declaration under Section 564(b)(1) of the Act, 21 U.S.C. section 360bbb-3(b)(1), unless the authorization is terminated or revoked sooner. Performed at [facility], [address], [place], [place] [address]          Radiology Studies: CT ABDOMEN PELVIS WO CONTRAST  Result Date: 12/04/2019 CLINICAL DATA:  Abdominal trauma. Flank pain. EXAM: CT ABDOMEN AND PELVIS WITHOUT CONTRAST TECHNIQUE: Multidetector CT imaging of the abdomen and pelvis was performed following the standard protocol without IV contrast. COMPARISON:  02/15/2016. FINDINGS: Lower chest: Peripheral predominant interstitial reticulation is noted in both lower lobes. No acute abnormality. Hepatobiliary: Within the limitations of unenhanced technique there is no evidence for up attic injury or perihepatic hematoma. Benign calcification noted within the dome of liver measuring 7 mm. Small low attenuation structure within posterior right hepatic lobe measures 5 mm and is too small to  characterize. The gallbladder is unremarkable. No biliary dilatation. Pancreas: Unremarkable. No pancreatic ductal dilatation or surrounding inflammatory changes. Spleen: No splenic injury or perisplenic hematoma. Adrenals/Urinary Tract: No adrenal hemorrhage or renal injury identified. Bladder is unremarkable. Stomach/Bowel: Small hiatal hernia. Stomach nondistended. No evidence of bowel wall thickening, distention, or inflammatory changes. Predominantly is left-sided colonic diverticulosis noted without acute inflammation. Vascular/Lymphatic: Aortic atherosclerosis. No aneurysm. No abdominopelvic adenopathy. Reproductive: Uterus and bilateral adnexa are unremarkable. Other: No abdominal wall hernia or abnormality. No abdominopelvic ascites. Musculoskeletal: Acute fracture deformity is identified involving the T12 vertebra with approximately 4 mm of retropulsion of fracture fragments. There is loss  of approximately 50% of the vertebral body height. The remaining lumbar vertebral body heights are well preserved. Multi level degenerative disc disease identified within the visualized thoracolumbar spine. Bilateral facet degenerative change noted at L4-5 and L5-S1. Previous right hip arthroplasty. No additional fractures identified. IMPRESSION: 1. Acute fracture deformity involving the T12 vertebra with approximately 4 mm of retropulsion of fracture fragments. 2. Aortic atherosclerosis. 3. Left-sided colonic diverticulosis without acute inflammation. Aortic Atherosclerosis (ICD10-I70.0). Electronically Signed   By: Kerby Moors M.D.   On: 12/04/2019 12:50   CT L-SPINE NO CHARGE  Result Date: 12/04/2019 CLINICAL DATA:  [place] 2 days ago at home.  Low back pain. EXAM: CT LUMBAR SPINE WITHOUT CONTRAST TECHNIQUE: Multidetector CT imaging of the lumbar spine was performed without intravenous contrast administration. Multiplanar CT image reconstructions were also generated. COMPARISON:  02/14/2016 FINDINGS: Segmentation:  5 lumbar type vertebral bodies. Alignment: 2 mm degenerative anterolisthesis L4-5 and L5-S1. Vertebrae: Acute compression fracture of the T12 vertebral body with loss of height of 50%. Posterior bowing of the posterior margin of the vertebral body by 5 mm encroaches upon the spinal canal. No posterior element fracture. Paraspinal and other soft tissues: See results of abdominal CT. Disc levels: No significant disc space finding at T11-12, T12-L1 or L1-2. L2-3: Disc bulge. Facet and ligamentous hypertrophy. Moderate stenosis. L3-4: Disc bulge.  Facet hypertrophy.  Moderate stenosis. L4-5: Facet arthropathy with 2 mm of anterolisthesis. Disc bulge. Severe multifactorial stenosis. L5-S1: Facet arthropathy with 2 mm of anterolisthesis. Disc bulge. Mild stenosis of the lateral recesses and foramina. IMPRESSION: Acute compression fracture at T12 with loss of height of 50%. Posterior bowing of the posterosuperior margin of the vertebral body by 5 mm encroaches upon the spinal canal. No sign that this is anything other than a benign fracture. Lower lumbar degenerative disc disease and degenerative facet disease. Severe multifactorial spinal stenosis at L4-5. Moderate stenosis at L2-3 and L3-4. Lateral recess and foraminal stenosis at L5-S1. Electronically Signed   By: Nelson Chimes M.D.   On: 12/04/2019 12:31        Scheduled Meds: . cholecalciferol  2,000 Units Oral Daily  . enoxaparin (LOVENOX) injection  30 mg Subcutaneous Q24H  . escitalopram  5 mg Oral Daily  . gabapentin  300 mg Oral BID  . meloxicam  7.5 mg Oral BID  . mirtazapine  15 mg Oral QHS  . pantoprazole  40 mg Oral Daily  . simvastatin  20 mg Oral QPM   Continuous Infusions: . sodium chloride 75 mL/hr at 12/06/19 0600     LOS: 2 days    Time spent:40 min    Tuyen Uncapher, Geraldo Docker, MD [facility] Pager [phone]  If 7PM-7AM, please contact night-coverage [email or web address] Password [facility] 12/06/2019, 11:07 AM

## 2019-12-06 NOTE — TOC Progression Note (Signed)
Transition of Care [facility]) - Progression Note    Patient Details  Name: [name] MRN: [id number] Date of Birth: 11/10/1922  Transition of Care [facility]) CM/SW Contact  Joaquin Courts, RN Phone Number: 12/06/2019, 2:37 PM  Clinical Narrative:    CM spoke with patient and daughter and provided bed offers.  Patient selects [facility] SNF for rehab.  Cm spoke with Navi and provided snf selection.  Auth approved from 5/18-5/20 reference number [id number].     Expected Discharge Plan: [place] Barriers to Discharge: Continued Medical Work up  Expected Discharge Plan and Services Expected Discharge Plan: [place]   Discharge Planning Services: CM Consult Post Acute Care Choice: [place] Living arrangements for the past 2 months: Single Family Home                                       Social Determinants of Health (SDOH) Interventions    Readmission Risk Interventions No flowsheet data found.

## 2019-12-07 ENCOUNTER — Ambulatory Visit: Payer: Medicare HMO | Admitting: Psychology

## 2019-12-07 ENCOUNTER — Inpatient Hospital Stay (HOSPITAL_COMMUNITY): Payer: Medicare HMO

## 2019-12-07 MED ORDER — ACETAMINOPHEN 500 MG PO TABS
1000.0000 mg | ORAL_TABLET | Freq: Three times a day (TID) | ORAL | Status: DC
  Administered 2019-12-07 – 2019-12-09 (×6): 1000 mg via ORAL
  Filled 2019-12-07 (×7): qty 2

## 2019-12-07 MED ORDER — HYDRALAZINE HCL 10 MG PO TABS
10.0000 mg | ORAL_TABLET | Freq: Once | ORAL | Status: AC
  Administered 2019-12-07: 10 mg via ORAL
  Filled 2019-12-07: qty 1

## 2019-12-07 MED ORDER — ENOXAPARIN SODIUM 40 MG/0.4ML ~~LOC~~ SOLN
40.0000 mg | SUBCUTANEOUS | Status: DC
  Administered 2019-12-07 – 2019-12-08 (×2): 40 mg via SUBCUTANEOUS
  Filled 2019-12-07 (×2): qty 0.4

## 2019-12-07 MED ORDER — LIDOCAINE 5 % EX PTCH
1.0000 | MEDICATED_PATCH | CUTANEOUS | Status: DC
  Administered 2019-12-07 – 2019-12-09 (×3): 1 via TRANSDERMAL
  Filled 2019-12-07 (×3): qty 1

## 2019-12-07 MED ORDER — SORBITOL 70 % SOLN
960.0000 mL | TOPICAL_OIL | Freq: Once | ORAL | Status: AC
  Administered 2019-12-07: 960 mL via RECTAL
  Filled 2019-12-07: qty 473

## 2019-12-07 MED ORDER — HYDRALAZINE HCL 25 MG PO TABS
25.0000 mg | ORAL_TABLET | Freq: Three times a day (TID) | ORAL | Status: DC
  Administered 2019-12-07 – 2019-12-09 (×6): 25 mg via ORAL
  Filled 2019-12-07 (×7): qty 1

## 2019-12-07 MED ORDER — OXYCODONE HCL 5 MG PO TABS
5.0000 mg | ORAL_TABLET | ORAL | Status: DC | PRN
  Administered 2019-12-07 – 2019-12-09 (×7): 5 mg via ORAL
  Filled 2019-12-07 (×7): qty 1

## 2019-12-07 MED ORDER — LABETALOL HCL 5 MG/ML IV SOLN
5.0000 mg | INTRAVENOUS | Status: DC | PRN
  Filled 2019-12-07: qty 4

## 2019-12-07 NOTE — Progress Notes (Signed)
Physical Therapy Treatment Patient Details Name: [name] MRN: [id number] DOB: Dec 07, 1922 Today's Date: 12/07/2019    History of Present Illness 86 yo female admitted with T12 compression fx after sustaining a fall at home. Hx of osteoporosis, spinal stenosis, R THA, neuropathy, polymyalgia rheumatica    PT Comments    Progressing with mobility. Moderate pain with activity.  Pt requires total assist to don TLSO. Remains at risk for further falls when mobilizing. Continue to recommend SNF.   Follow Up Recommendations  SNF     Equipment Recommendations  None recommended by PT    Recommendations for Other Services       Precautions / Restrictions Precautions Precautions: Fall Precaution Comments: back precautions/logroll for comfort Required Braces or Orthoses: Spinal Brace Spinal Brace: Thoracolumbosacral orthotic;Applied in sitting position Restrictions Weight Bearing Restrictions: No    Mobility  Bed Mobility               General bed mobility comments: sitting EOB with OT  Transfers Overall transfer level: Needs assistance Equipment used: Rolling walker (2 wheeled) Transfers: Sit to/from Stand Sit to Stand: Min assist;From elevated surface         General transfer comment: Assist to power up, stabilize, control descent. VCs safety, hand placement. Increased time  Ambulation/Gait Ambulation/Gait assistance: Min assist Gait Distance (Feet): 15 Feet Assistive device: Rolling walker (2 wheeled) Gait Pattern/deviations: Step-through pattern;Decreased stride length     General Gait Details: Distance limited by pain, fatigue. Cues for posture, distance from RW. Assist to steady.   Stairs             Wheelchair Mobility    Modified Rankin (Stroke Patients Only)       Balance Overall balance assessment: Modified Independent;History of Falls         Standing balance support: Bilateral upper extremity supported Standing balance-Leahy  Scale: Poor                              Cognition Arousal/Alertness: Awake/alert Behavior During Therapy: WFL for tasks assessed/performed Overall Cognitive Status: Within Functional Limits for tasks assessed                                        Exercises      General Comments        Pertinent Vitals/Pain Pain Assessment: Faces Faces Pain Scale: Hurts even more Pain Location: back Pain Descriptors / Indicators: Aching;Sore;Discomfort Pain Intervention(s): Limited activity within patient's tolerance;Monitored during session;Repositioned    Home Living                      Prior Function            PT Goals (current goals can now be found in the care plan section) Progress towards PT goals: Progressing toward goals    Frequency    Min 3X/week      PT Plan Current plan remains appropriate    Co-evaluation              AM-PAC PT "6 Clicks" Mobility   Outcome Measure  Help needed turning from your back to your side while in a flat bed without using bedrails?: A Little Help needed moving from lying on your back to sitting on the side of a flat bed without using bedrails?: A Little  Help needed moving to and from a bed to a chair (including a wheelchair)?: A Little Help needed standing up from a chair using your arms (e.g., wheelchair or bedside chair)?: A Little Help needed to walk in hospital room?: A Little Help needed climbing 3-5 steps with a railing? : A Lot 6 Click Score: 17    End of Session Equipment Utilized During Treatment: Gait belt Activity Tolerance: Patient limited by fatigue;Patient limited by pain Patient left: in chair;with call bell/phone within reach(with OT)   PT Visit Diagnosis: Muscle weakness (generalized) (M62.81);Pain;Difficulty in walking, not elsewhere classified (R26.2);History of falling (Z91.81) Pain - part of body: (back)     Time: [id number] PT Time Calculation (min) (ACUTE ONLY):  9 min  Charges:  $Gait Training: 8-22 mins                         Doreatha Massed, PT Acute Rehabilitation

## 2019-12-07 NOTE — Progress Notes (Signed)
PROGRESS NOTE    [name]  [id number] DOB: [birth year] DOA: 12/04/2019 PCP: Harrison Mons, PA   Chief Complaint  Patient presents with  . Fall   Brief Narrative:  [name] [name] 86 y.o.WF PMHx HTN, GERD, HLD.   Presents after [name] mechanical fall. She reports that while she was trying to move some packages from her porch, she fell back on to her butt and then rolled back on her back and her head. She remembers the entire fall. When she threw one of her packages, the weight of the package pulled her back to initiate the fall. She was unable to get up on her own, so she called EMS. She was on the ground for about 20 or so minutes before she was assisted up. No head injury was noted and she refused to go to the ED. She denied any pain at the time and stayed home. Saturday she began having pain at her flanks and tried her best to ignore it. It was worse with moving. Nothing relieved it. She tried to stay in bed and not move, but the pain has progressed. Her daughter insisted that she go to the ED today, so she did.  ED Course:At [facility] she was evaluated. [name] CT of the ab/pelvis revealed an acute T12 fracture. TRH at [facility] was called for admission.  Review of Systems:Reports back pain worse with movement. Denies numbness, tingling, LOC, incontinence.Remainder of 10 point review of systems is otherwise negative for all not mentioned in   Assessment & Plan:   Active Problems:   Compression fracture of body of thoracic vertebra (HCC)   Spinal fracture of T12 vertebra (HCC)   Acute fracture T12/mechanical fall -Neurosurgery consulted, appreciate recs - TLSO brace, mobilize, pain control -> follow outpatient with Dr. [name] in 6 weeks for x rays -TLSO provided -PT/OT recommend SNF -Pain management. -> schedule APAP, mobic, oxycodone, gabapentin, lidocaine patch - has significant pain with movement, asking about kyphoplasty -> will see if we can improve her pain management first.   Discussed with neurosurgery, Dr. [name], who notes he likes to follow outpatient prior to kyphoplasty if possible - will attempt pain control with regimen above.  If difficulty with pain regimen, will discuss possible procedure.  Hypoxia: 83-88% on RA per nursing.  possibly related to opiates?  She's alert per RN.  Follow CXR.  IS, flutter.  Continuous pulse ox.  Workup further as indicated.  Essential HTN -Losartan 50 mg daily  - hydralzine 25 mg TID - maybe related to pain as well, follow  - labetalol prn   HLD -Simvastatin 20 mg daily  Depression/anxiety -Lexapro 5 mg daily -Remeron 15 mg daily  Neuropathy -Gabapentin 300 mg BID  AKI vs CKD -resolved -Monitor renal function carefully -Limit nephrotoxic medication  DVT prophylaxis: lovenox Code Status: DNR Family Communication:daughter Disposition:   Status is: Inpatient  Remains inpatient appropriate because:Inpatient level of care appropriate due to severity of illness   Dispo: The patient is from: Home              Anticipated d/c is to: SNF              Anticipated d/c date is: 2 days              Patient currently is not medically stable to d/c.  Consultants:   neurosurgery  Procedures:   none  Antimicrobials:  Anti-infectives (From admission, onward)   None     Subjective: C/o  pain with movement No new numbness, tingling, weakness  Objective: Vitals:   12/07/19 0730 12/07/19 0832 12/07/19 0942 12/07/19 1319  BP: (!) 195/92 (!) 203/91 (!) 181/81 131/83  Pulse: 67 69 67 65  Resp: 18   18  Temp: 98.7 F (37.1 C)   98.2 F (36.8 C)  TempSrc: Oral   Oral  SpO2: 94% 97%  96%  Weight:      Height:        Intake/Output Summary (Last 24 hours) at 12/07/2019 1609 Last data filed at 12/07/2019 1330 Gross per 24 hour  Intake 1200 ml  Output 4000 ml  Net -2800 ml   Filed Weights   12/04/19 0949  Weight: 72.6 kg    Examination:  General exam: Appears calm and comfortable.  Uncomfortable  with movement.  Respiratory system: Clear to auscultation. Respiratory effort normal. Cardiovascular system: S1 & S2 heard, RRR Gastrointestinal system: Abdomen is nondistended, soft and nontender.  Central nervous system: Alert and oriented. No focal neurological deficits. Extremities: moving all extremities Skin: No rashes, lesions or ulcers Psychiatry: Judgement and insight appear normal. Mood & affect appropriate.     Data Reviewed: I have personally reviewed following labs and imaging studies  CBC: Recent Labs  Lab 12/04/19 1047 12/05/19 0251 12/06/19 0310  WBC 15.8* 10.9* 11.0*  NEUTROABS  --   --  7.9*  HGB 12.6 9.6* 10.0*  HCT 39.8 30.9* 32.3*  MCV 92.8 94.8 95.3  PLT 299 216 [id number]    Basic Metabolic Panel: Recent Labs  Lab 12/04/19 1047 12/05/19 0251 12/06/19 0310  NA 133* 136 132*  K 3.7 3.3* 4.2  CL 91* 97* 99  CO2 26 28 24   GLUCOSE 159* 141* 110*  BUN 25* 34* 31*  CREATININE 1.86* 1.55* 0.97  CALCIUM 9.2 8.2* 8.3*  MG  --   --  1.7  PHOS  --   --  3.7    GFR: Estimated Creatinine Clearance: 34.7 mL/min (by C-G formula based on SCr of 0.97 mg/dL).  Liver Function Tests: Recent Labs  Lab 12/05/19 0251 12/06/19 0310  AST 38 28  ALT 18 19  ALKPHOS 86 118  BILITOT 0.4 0.5  PROT 5.8* 6.0*  ALBUMIN 2.8* 2.8*    CBG: No results for input(s): GLUCAP in the last 168 hours.   Recent Results (from the past 240 hour(s))  SARS Coronavirus 2 by RT PCR (hospital order, performed in [facility] hospital lab) Nasopharyngeal Nasopharyngeal Swab     Status: None   Collection Time: 12/04/19  2:49 PM   Specimen: Nasopharyngeal Swab  Result Value Ref Range Status   SARS Coronavirus 2 NEGATIVE NEGATIVE Final    Comment: (NOTE) SARS-CoV-2 target nucleic acids are NOT DETECTED. The SARS-CoV-2 RNA is generally detectable in upper and lower respiratory specimens during the acute phase of infection. The lowest concentration of SARS-CoV-2 viral copies this assay  can detect is 250 copies / mL. Levante Simones negative result does not preclude SARS-CoV-2 infection and should not be used as the sole basis for treatment or other patient management decisions.  Jayshaun Phillips negative result may occur with improper specimen collection / handling, submission of specimen other than nasopharyngeal swab, presence of viral mutation(s) within the areas targeted by this assay, and inadequate number of viral copies (<250 copies / mL). Lorne Winkels negative result must be combined with clinical observations, patient history, and epidemiological information. Fact Sheet for Patients:   StrictlyIdeas.no Fact Sheet for Healthcare Providers: BankingDealers.co.za This test is not yet  approved or cleared  by the [place] and has been authorized for detection and/or diagnosis of SARS-CoV-2 by FDA under an Emergency Use Authorization (EUA).  This EUA will remain in effect (meaning this test can be used) for the duration of the COVID-19 declaration under Section 564(b)(1) of the Act, 21 U.S.C. section 360bbb-3(b)(1), unless the authorization is terminated or revoked sooner. Performed at [facility], [address] [place], [place] [address]          Radiology Studies: No results found.      Scheduled Meds: . acetaminophen  1,000 mg Oral Q8H  . cholecalciferol  2,000 Units Oral Daily  . escitalopram  5 mg Oral Daily  . gabapentin  300 mg Oral BID  . hydrALAZINE  25 mg Oral Q8H  . lidocaine  1 patch Transdermal Q24H  . losartan  50 mg Oral Daily  . meloxicam  7.5 mg Oral BID  . mirtazapine  15 mg Oral QHS  . pantoprazole  40 mg Oral Daily  . simvastatin  20 mg Oral QPM   Continuous Infusions: . sodium chloride 75 mL/hr at 12/06/19 0600     LOS: 3 days    Time spent: over 30 min    Fayrene Helper, MD [facility]   To contact the attending provider between 7A-7P or the covering provider during after  hours 7P-7A, please log into the web site [email or web address] and access using universal [place] password for that web site. If you do not have the password, please call the hospital operator.  12/07/2019, 4:09 PM

## 2019-12-07 NOTE — Care Management Important Message (Signed)
Important Message  Patient Details IM Letter given to Roque Lias SW Case Manager to present to the Patient Name: [name] MRN: [id number] Date of Birth: 03-07-1923   Medicare Important Message Given:  Yes     Kerin Salen 12/07/2019, 11:39 AM

## 2019-12-07 NOTE — Progress Notes (Signed)
Occupational Therapy Treatment Patient Details Name: [name] MRN: [id number] DOB: [birth year] Today's Date: 12/07/2019    History of present illness 86 yo female admitted with T12 compression fx after sustaining a fall at home. Hx of osteoporosis, spinal stenosis, R THA, neuropathy, polymyalgia rheumatica   OT comments  Patient continues to be limited by pain reporting pain 8/10. Rn provided pain medication during treatment. Treatment focused on improving patient's functional mobility, instruction on spinal precautions and improving activity tolerance in order to return patient to PLOF. Patient able to tolerate grooming task seated in recliner with back supported - but continued to guard due to pain. Continue to recommend SNF due to limited activity tolerance.    Follow Up Recommendations  SNF    Equipment Recommendations  3 in 1 bedside commode    Recommendations for Other Services      Precautions / Restrictions Precautions Precautions: Fall Precaution Comments: back precautions/logroll for comfort Required Braces or Orthoses: Spinal Brace Spinal Brace: Thoracolumbosacral orthotic;Applied in sitting position Restrictions Weight Bearing Restrictions: No       Mobility Bed Mobility Overal bed mobility: Needs Assistance Bed Mobility: Rolling;Sidelying to Sit Rolling: Min assist Sidelying to sit: Mod assist       General bed mobility comments: Patient transferred to side of bed with mod assist. Therapist instructed patient on log roll technqiue and spinal precautions. Min assist to roll to the right. Mod assist for trunk negotiation into sitting. Patient able to sit at side of bed but complaining of back pain.  Transfers                      Balance                                           ADL either performed or assessed with clinical judgement   ADL       Grooming: Wash/dry hands;Wash/dry face;Oral care;Sitting;Brushing hair;Set  up Grooming Details (indicate cue type and reason): Patient performed grooming task sitting in recliner with back supported. patient reports back pain throughout treatment but agreeable to participate.                                     Vision       Perception     Praxis      Cognition Arousal/Alertness: Awake/alert Behavior During Therapy: WFL for tasks assessed/performed Overall Cognitive Status: Within Functional Limits for tasks assessed                                          Exercises     Shoulder Instructions       General Comments      Pertinent Vitals/ Pain       Pain Assessment: 0-10 Pain Score: 8  Pain Location: back  Home Living                                          Prior Functioning/Environment              Frequency  Min 2X/week  Progress Toward Goals  OT Goals(current goals can now be found in the care plan section)        Plan      Co-evaluation                 AM-PAC OT "6 Clicks" Daily Activity     Outcome Measure                    End of Session Equipment Utilized During Treatment: Back brace  OT Visit Diagnosis: Unsteadiness on feet (R26.81);Other abnormalities of gait and mobility (R26.89);Repeated falls (R29.6);History of falling (Z91.81);Muscle weakness (generalized) (M62.81);Pain   Activity Tolerance Patient limited by pain   Patient Left in chair;with call bell/phone within reach;with chair alarm set   Nurse Communication Mobility status        Time: [id number] OT Time Calculation (min): 21 min  Charges: OT General Charges $OT Visit: 1 Visit OT Treatments $Self Care/Home Management : 8-22 mins  Derl Barrow, OTR/[facility] [place]  Office [phone]    Lenward Chancellor 12/07/2019, 5:42 PM

## 2019-12-08 LAB — CBC WITH DIFFERENTIAL/PLATELET
Abs Immature Granulocytes: 0.03 10*3/uL (ref 0.00–0.07)
Basophils Absolute: 0.1 10*3/uL (ref 0.0–0.1)
Basophils Relative: 1 %
Eosinophils Absolute: 0.4 10*3/uL (ref 0.0–0.5)
Eosinophils Relative: 4 %
HCT: 34.7 % — ABNORMAL LOW (ref 36.0–46.0)
Hemoglobin: 10.9 g/dL — ABNORMAL LOW (ref 12.0–15.0)
Immature Granulocytes: 0 %
Lymphocytes Relative: 14 %
Lymphs Abs: 1.6 10*3/uL (ref 0.7–4.0)
MCH: 29.6 pg (ref 26.0–34.0)
MCHC: 31.4 g/dL (ref 30.0–36.0)
MCV: 94.3 fL (ref 80.0–100.0)
Monocytes Absolute: 0.9 10*3/uL (ref 0.1–1.0)
Monocytes Relative: 8 %
Neutro Abs: 8.1 10*3/uL — ABNORMAL HIGH (ref 1.7–7.7)
Neutrophils Relative %: 73 %
Platelets: 239 10*3/uL (ref 150–400)
RBC: 3.68 MIL/uL — ABNORMAL LOW (ref 3.87–5.11)
RDW: 12.3 % (ref 11.5–15.5)
WBC: 11.1 10*3/uL — ABNORMAL HIGH (ref 4.0–10.5)
nRBC: 0 % (ref 0.0–0.2)

## 2019-12-08 LAB — COMPREHENSIVE METABOLIC PANEL
ALT: 17 U/L (ref 0–44)
AST: 19 U/L (ref 15–41)
Albumin: 2.7 g/dL — ABNORMAL LOW (ref 3.5–5.0)
Alkaline Phosphatase: 157 U/L — ABNORMAL HIGH (ref 38–126)
Anion gap: 8 (ref 5–15)
BUN: 17 mg/dL (ref 8–23)
CO2: 29 mmol/L (ref 22–32)
Calcium: 8.6 mg/dL — ABNORMAL LOW (ref 8.9–10.3)
Chloride: 97 mmol/L — ABNORMAL LOW (ref 98–111)
Creatinine, Ser: 0.76 mg/dL (ref 0.44–1.00)
GFR calc Af Amer: >60 mL/min (ref >60)
GFR calc non Af Amer: >60 mL/min (ref >60)
Glucose, Bld: 109 mg/dL — ABNORMAL HIGH (ref 70–99)
Potassium: 3.4 mmol/L — ABNORMAL LOW (ref 3.5–5.1)
Sodium: 134 mmol/L — ABNORMAL LOW (ref 135–145)
Total Bilirubin: 0.6 mg/dL (ref 0.3–1.2)
Total Protein: 5.9 g/dL — ABNORMAL LOW (ref 6.5–8.1)

## 2019-12-08 LAB — SARS CORONAVIRUS 2 (TAT 6-24 HRS): SARS Coronavirus 2: NEGATIVE

## 2019-12-08 MED ORDER — MORPHINE SULFATE (PF) 2 MG/ML IV SOLN
1.0000 mg | INTRAVENOUS | Status: DC | PRN
  Administered 2019-12-08: 1 mg via INTRAVENOUS
  Filled 2019-12-08: qty 1

## 2019-12-08 MED ORDER — POLYETHYLENE GLYCOL 3350 17 G PO PACK
17.0000 g | PACK | Freq: Two times a day (BID) | ORAL | Status: DC
  Administered 2019-12-08 – 2019-12-09 (×2): 17 g via ORAL
  Filled 2019-12-08 (×2): qty 1

## 2019-12-08 MED ORDER — POTASSIUM CHLORIDE CRYS ER 20 MEQ PO TBCR
40.0000 meq | EXTENDED_RELEASE_TABLET | Freq: Once | ORAL | Status: AC
  Administered 2019-12-08: 40 meq via ORAL
  Filled 2019-12-08: qty 2

## 2019-12-08 MED ORDER — CALCITONIN (SALMON) 200 UNIT/ACT NA SOLN
1.0000 | Freq: Every day | NASAL | Status: DC
  Administered 2019-12-08 – 2019-12-09 (×2): 1 via NASAL
  Filled 2019-12-08: qty 3.7

## 2019-12-08 NOTE — Plan of Care (Signed)
  Problem: Health Behavior/Discharge Planning: Goal: Ability to manage health-related needs will improve Outcome: Progressing   Problem: Clinical Measurements: Goal: Will remain free from infection Outcome: Progressing   Problem: Clinical Measurements: Goal: Diagnostic test results will improve Outcome: Progressing   Problem: Clinical Measurements: Goal: Respiratory complications will improve Outcome: Progressing   Problem: Activity: Goal: Risk for activity intolerance will decrease Outcome: Progressing   Problem: Nutrition: Goal: Adequate nutrition will be maintained Outcome: Progressing   

## 2019-12-08 NOTE — Progress Notes (Signed)
PROGRESS NOTE    [name]  [id number] DOB: [birth year] DOA: 12/04/2019 PCP: Harrison Mons, PA   Chief Complaint  Patient presents with  . Fall   Brief Narrative:  [name] 86 y.o.WF PMHx HTN, GERD, HLD.   Presents after [name] mechanical fall. She reports that while she was trying to move some packages from her porch, she fell back on to her butt and then rolled back on her back and her head. She remembers the entire fall. When she threw one of her packages, the weight of the package pulled her back to initiate the fall. She was unable to get up on her own, so she called EMS. She was on the ground for about 20 or so minutes before she was assisted up. No head injury was noted and she refused to go to the ED. She denied any pain at the time and stayed home. Saturday she began having pain at her flanks and tried her best to ignore it. It was worse with moving. Nothing relieved it. She tried to stay in bed and not move, but the pain has progressed. Her daughter insisted that she go to the ED today, so she did.  ED Course:At [facility] she was evaluated. Zell Hylton CT of the ab/pelvis revealed an acute T12 fracture. TRH at [facility] was called for admission.  Review of Systems:Reports back pain worse with movement. Denies numbness, tingling, LOC, incontinence.Remainder of 10 point review of systems is otherwise negative for all not mentioned in   Assessment & Plan:   Active Problems:   Compression fracture of body of thoracic vertebra (HCC)   Spinal fracture of T12 vertebra (HCC)   Acute fracture T12/mechanical fall -Neurosurgery consulted, appreciate recs - TLSO brace, mobilize, pain control -> follow outpatient with Dr. [name] in 6 weeks for x rays -TLSO provided -PT/OT recommend SNF -Pain management. -> schedule APAP, mobic, oxycodone, gabapentin, lidocaine patch, calcitonin - morphine for breakthrough - Seems she's improving on current regimen -> follow.  Hopefully will be stable for d/c  to SNF soon.  Hypoxia: Improved today.  possibly related to opiates? Follow CXR without acute finding.  IS, flutter.  Continuous pulse ox.  Workup further as indicated.  Tachycardia: noted on continuous pulse ox while at bedside, in 120's-130's.  Possibly related to discomfort.  Follow EKG.  Continue to monitor, seems asymptomatic.  Essential HTN -Losartan 50 mg daily  - hydralzine 25 mg TID - maybe related to pain as well, follow  - labetalol prn  - fluctuating, though improved  HLD -Simvastatin 20 mg daily  Depression/anxiety -Lexapro 5 mg daily -Remeron 15 mg daily  Neuropathy -Gabapentin 300 mg BID  AKI vs CKD -resolved -Monitor renal function carefully -Limit nephrotoxic medication  DVT prophylaxis: lovenox Code Status: DNR Family Communication:daughter Disposition:   Status is: Inpatient  Remains inpatient appropriate because:Inpatient level of care appropriate due to severity of illness   Dispo: The patient is from: Home              Anticipated d/c is to: SNF              Anticipated d/c date is: 2 days              Patient currently is not medically stable to d/c.  Consultants:   neurosurgery  Procedures:   none  Antimicrobials:  Anti-infectives (From admission, onward)   None     Subjective: Pain is better Feels generally weak  Objective: Vitals:  12/07/19 2326 12/08/19 0611 12/08/19 1029 12/08/19 1347  BP: (!) 145/76 (!) 176/85 137/88 134/76  Pulse: 69 63 (!) 107 81  Resp: 16 14  16   Temp:  98.3 F (36.8 C)  97.9 F (36.6 C)  TempSrc:  Oral    SpO2: 96% 97% 98%   Weight:      Height:        Intake/Output Summary (Last 24 hours) at 12/08/2019 1444 Last data filed at 12/08/2019 1000 Gross per 24 hour  Intake 600 ml  Output 300 ml  Net 300 ml   Filed Weights   12/04/19 0949  Weight: 72.6 kg    Examination:  General: No acute distress. Cardiovascular: Heart sounds show Bertie Simien regular rate, and rhythm Lungs: Clear to  auscultation bilaterally Abdomen: Soft, nontender, nondistended  Neurological: Alert and oriented 3. Moves all extremities 4 . Cranial nerves II through XII grossly intact. Skin: Warm and dry. No rashes or lesions. Extremities: No clubbing or cyanosis. No edema  Data Reviewed: I have personally reviewed following labs and imaging studies  CBC: Recent Labs  Lab 12/04/19 1047 12/05/19 0251 12/06/19 0310 12/08/19 0328  WBC 15.8* 10.9* 11.0* 11.1*  NEUTROABS  --   --  7.9* 8.1*  HGB 12.6 9.6* 10.0* 10.9*  HCT 39.8 30.9* 32.3* 34.7*  MCV 92.8 94.8 95.3 94.3  PLT 299 216 192 [id number]    Basic Metabolic Panel: Recent Labs  Lab 12/04/19 1047 12/05/19 0251 12/06/19 0310 12/08/19 0328  NA 133* 136 132* 134*  K 3.7 3.3* 4.2 3.4*  CL 91* 97* 99 97*  CO2 26 28 24 29   GLUCOSE 159* 141* 110* 109*  BUN 25* 34* 31* 17  CREATININE 1.86* 1.55* 0.97 0.76  CALCIUM 9.2 8.2* 8.3* 8.6*  MG  --   --  1.7  --   PHOS  --   --  3.7  --     GFR: Estimated Creatinine Clearance: 42 mL/min (by C-G formula based on SCr of 0.76 mg/dL).  Liver Function Tests: Recent Labs  Lab 12/05/19 0251 12/06/19 0310 12/08/19 0328  AST 38 28 19  ALT 18 19 17   ALKPHOS 86 118 157*  BILITOT 0.4 0.5 0.6  PROT 5.8* 6.0* 5.9*  ALBUMIN 2.8* 2.8* 2.7*    CBG: No results for input(s): GLUCAP in the last 168 hours.   Recent Results (from the past 240 hour(s))  SARS Coronavirus 2 by RT PCR (hospital order, performed in [facility] hospital lab) Nasopharyngeal Nasopharyngeal Swab     Status: None   Collection Time: 12/04/19  2:49 PM   Specimen: Nasopharyngeal Swab  Result Value Ref Range Status   SARS Coronavirus 2 NEGATIVE NEGATIVE Final    Comment: (NOTE) SARS-CoV-2 target nucleic acids are NOT DETECTED. The SARS-CoV-2 RNA is generally detectable in upper and lower respiratory specimens during the acute phase of infection. The lowest concentration of SARS-CoV-2 viral copies this assay can detect is  250 copies / mL. Zaxton Angerer negative result does not preclude SARS-CoV-2 infection and should not be used as the sole basis for treatment or other patient management decisions.  Antony Sian negative result may occur with improper specimen collection / handling, submission of specimen other than nasopharyngeal swab, presence of viral mutation(s) within the areas targeted by this assay, and inadequate number of viral copies (<250 copies / mL). Michaelah Credeur negative result must be combined with clinical observations, patient history, and epidemiological information. Fact Sheet for Patients:   StrictlyIdeas.no Fact Sheet for Healthcare Providers:  BankingDealers.co.za This test is not yet approved or cleared  by the [place] and has been authorized for detection and/or diagnosis of SARS-CoV-2 by FDA under an Emergency Use Authorization (EUA).  This EUA will remain in effect (meaning this test can be used) for the duration of the COVID-19 declaration under Section 564(b)(1) of the Act, 21 U.S.C. section 360bbb-3(b)(1), unless the authorization is terminated or revoked sooner. Performed at [facility], [address] [place], [place] [address]   SARS CORONAVIRUS 2 (TAT 6-24 HRS) Nasopharyngeal Nasopharyngeal Swab     Status: None   Collection Time: 12/08/19  4:22 AM   Specimen: Nasopharyngeal Swab  Result Value Ref Range Status   SARS Coronavirus 2 NEGATIVE NEGATIVE Final    Comment: (NOTE) SARS-CoV-2 target nucleic acids are NOT DETECTED. The SARS-CoV-2 RNA is generally detectable in upper and lower respiratory specimens during the acute phase of infection. Negative results do not preclude SARS-CoV-2 infection, do not rule out co-infections with other pathogens, and should not be used as the sole basis for treatment or other patient management decisions. Negative results must be combined with clinical observations, patient history, and  epidemiological information. The expected result is Negative. Fact Sheet for Patients: SugarRoll.be Fact Sheet for Healthcare Providers: https://www.woods-mathews.com/ This test is not yet approved or cleared by the [place] FDA and  has been authorized for detection and/or diagnosis of SARS-CoV-2 by FDA under an Emergency Use Authorization (EUA). This EUA will remain  in effect (meaning this test can be used) for the duration of the COVID-19 declaration under Section 56 4(b)(1) of the Act, 21 U.S.C. section 360bbb-3(b)(1), unless the authorization is terminated or revoked sooner. Performed at [facility], [place] [address], [place], [place] [address]          Radiology Studies: DG CHEST PORT 1 VIEW  Result Date: 12/07/2019 CLINICAL DATA:  [place], T12 fracture on recent CT EXAM: PORTABLE CHEST 1 VIEW COMPARISON:  12/04/2019, 02/14/2016 FINDINGS: Single frontal view of the chest demonstrates an unremarkable cardiac silhouette. No airspace disease, effusion, or pneumothorax. The T12 compression fracture seen on recent CT is not adequately visualized on this portable exam. No other acute bony abnormalities. IMPRESSION: 1. No acute intrathoracic process. Electronically Signed   By: Randa Ngo M.D.   On: 12/07/2019 18:13        Scheduled Meds: . acetaminophen  1,000 mg Oral Q8H  . calcitonin (salmon)  1 spray Alternating Nares Daily  . cholecalciferol  2,000 Units Oral Daily  . enoxaparin (LOVENOX) injection  40 mg Subcutaneous Q24H  . escitalopram  5 mg Oral Daily  . gabapentin  300 mg Oral BID  . hydrALAZINE  25 mg Oral Q8H  . lidocaine  1 patch Transdermal Q24H  . losartan  50 mg Oral Daily  . meloxicam  7.5 mg Oral BID  . mirtazapine  15 mg Oral QHS  . pantoprazole  40 mg Oral Daily  . polyethylene glycol  17 g Oral BID  . simvastatin  20 mg Oral QPM   Continuous Infusions: . sodium chloride Stopped (12/07/19 1930)      LOS: 4 days    Time spent: over 49 min    Fayrene Helper, MD [facility]   To contact the attending provider between 7A-7P or the covering provider during after hours 7P-7A, please log into the web site [email or web address] and access using universal [place] password for that web site. If you do not have  the password, please call the hospital operator.  12/08/2019, 2:44 PM

## 2019-12-09 LAB — CBC WITH DIFFERENTIAL/PLATELET
Abs Immature Granulocytes: 0.04 10*3/uL (ref 0.00–0.07)
Basophils Absolute: 0.1 10*3/uL (ref 0.0–0.1)
Basophils Relative: 1 %
Eosinophils Absolute: 0.6 10*3/uL — ABNORMAL HIGH (ref 0.0–0.5)
Eosinophils Relative: 5 %
HCT: 36.8 % (ref 36.0–46.0)
Hemoglobin: 11.4 g/dL — ABNORMAL LOW (ref 12.0–15.0)
Immature Granulocytes: 0 %
Lymphocytes Relative: 19 %
Lymphs Abs: 2.2 10*3/uL (ref 0.7–4.0)
MCH: 29.2 pg (ref 26.0–34.0)
MCHC: 31 g/dL (ref 30.0–36.0)
MCV: 94.1 fL (ref 80.0–100.0)
Monocytes Absolute: 1.2 10*3/uL — ABNORMAL HIGH (ref 0.1–1.0)
Monocytes Relative: 11 %
Neutro Abs: 7.1 10*3/uL (ref 1.7–7.7)
Neutrophils Relative %: 64 %
Platelets: 266 10*3/uL (ref 150–400)
RBC: 3.91 MIL/uL (ref 3.87–5.11)
RDW: 12.4 % (ref 11.5–15.5)
WBC: 11.1 10*3/uL — ABNORMAL HIGH (ref 4.0–10.5)
nRBC: 0 % (ref 0.0–0.2)

## 2019-12-09 LAB — COMPREHENSIVE METABOLIC PANEL
ALT: 20 U/L (ref 0–44)
AST: 24 U/L (ref 15–41)
Albumin: 2.7 g/dL — ABNORMAL LOW (ref 3.5–5.0)
Alkaline Phosphatase: 158 U/L — ABNORMAL HIGH (ref 38–126)
Anion gap: 7 (ref 5–15)
BUN: 21 mg/dL (ref 8–23)
CO2: 28 mmol/L (ref 22–32)
Calcium: 8.6 mg/dL — ABNORMAL LOW (ref 8.9–10.3)
Chloride: 92 mmol/L — ABNORMAL LOW (ref 98–111)
Creatinine, Ser: 0.73 mg/dL (ref 0.44–1.00)
GFR calc Af Amer: >60 mL/min (ref >60)
GFR calc non Af Amer: >60 mL/min (ref >60)
Glucose, Bld: 95 mg/dL (ref 70–99)
Potassium: 4.3 mmol/L (ref 3.5–5.1)
Sodium: 127 mmol/L — ABNORMAL LOW (ref 135–145)
Total Bilirubin: 0.5 mg/dL (ref 0.3–1.2)
Total Protein: 6 g/dL — ABNORMAL LOW (ref 6.5–8.1)

## 2019-12-09 LAB — BASIC METABOLIC PANEL
Anion gap: 11 (ref 5–15)
BUN: 18 mg/dL (ref 8–23)
CO2: 25 mmol/L (ref 22–32)
Calcium: 8.7 mg/dL — ABNORMAL LOW (ref 8.9–10.3)
Chloride: 93 mmol/L — ABNORMAL LOW (ref 98–111)
Creatinine, Ser: 0.83 mg/dL (ref 0.44–1.00)
GFR calc Af Amer: >60 mL/min (ref >60)
GFR calc non Af Amer: >60 mL/min (ref >60)
Glucose, Bld: 165 mg/dL — ABNORMAL HIGH (ref 70–99)
Potassium: 4.1 mmol/L (ref 3.5–5.1)
Sodium: 129 mmol/L — ABNORMAL LOW (ref 135–145)

## 2019-12-09 LAB — MAGNESIUM: Magnesium: 1.7 mg/dL (ref 1.7–2.4)

## 2019-12-09 LAB — PHOSPHORUS: Phosphorus: 3.8 mg/dL (ref 2.5–4.6)

## 2019-12-09 MED ORDER — LOPERAMIDE HCL 2 MG PO CAPS
2.0000 mg | ORAL_CAPSULE | Freq: Once | ORAL | Status: AC
  Administered 2019-12-09: 2 mg via ORAL
  Filled 2019-12-09: qty 1

## 2019-12-09 MED ORDER — OXYCODONE HCL 5 MG PO TABS: 5.0000 mg | ORAL_TABLET | ORAL | 0 refills | Status: AC | PRN

## 2019-12-09 MED ORDER — LIDOCAINE 5 % EX PTCH: 1.0000 | MEDICATED_PATCH | CUTANEOUS | 0 refills | Status: AC

## 2019-12-09 MED ORDER — HYDRALAZINE HCL 25 MG PO TABS: 25.0000 mg | ORAL_TABLET | Freq: Three times a day (TID) | ORAL | 0 refills | Status: AC

## 2019-12-09 MED ORDER — CALCITONIN (SALMON) 200 UNIT/ACT NA SOLN: 1.0000 | Freq: Every day | NASAL | 0 refills | Status: AC

## 2019-12-09 MED ORDER — POLYETHYLENE GLYCOL 3350 17 G PO PACK: 17.0000 g | PACK | Freq: Two times a day (BID) | ORAL | 0 refills | Status: AC

## 2019-12-09 NOTE — Progress Notes (Addendum)
Pt has been D/C. Notified Jamie with [facility] SNF. Faxed D/C summary and SNF transfer form. Notified RN that pt is going to room 3250.  15:52 - Addendum: Arranged ambulance transportation with [facility].

## 2019-12-09 NOTE — Progress Notes (Signed)
Pt is ready to be D/C today. [employer] authorization approval for SNF expired yesterday. Contacted [employer], spoke with [name], CM, she updated the approval dates from 12/09/18-12/13/18. Reference # [id number] [place], fax [phone].  Contacted Jamie with [facility], she reports that pt's daughter needs to complete the paperwork. Contacted pt's daughter [name]) at [phone] and left a VM regarding the paperwork for SNF. Will continue to f/u to assist with the D/C plan.

## 2019-12-09 NOTE — Discharge Summary (Addendum)
Physician Discharge Summary  [name] [id number] DOB: Dec 08, 1922 DOA: 12/04/2019  PCP: [name] Mons, PA  Admit date: 12/04/2019 Discharge date: 12/09/2019  Time spent: 40 minutes  Recommendations for Outpatient Follow-up:  1. Follow outpatient CBC/CMP -> attention to sodium 2. Follow pain control outpatient -> continue tylenol, mobic, lidocaine patch, and oxycodone prn pain, as well as calcitonin 3. Continue therapy at SNF 4. Follow up with neurosurgery outpatient - continue TLSO brace 5. Intermittent hyperglycemia noted on BMP, would follow outpatient A1c  6. Loose stools noted prior to discharge, she had miralax - hold miralax for loose stools, but would resume this once they slow down as she'll be on oxycodone  Discharge Diagnoses:  Active Problems:   Compression fracture of body of thoracic vertebra ([place])   Spinal fracture of T12 vertebra [facility])  Discharge Condition: stable  Filed Weights   12/04/19 0949  Weight: 72.6 kg    History of present illness:  [name] [name] 86 y.o.WF PMHx HTN, GERD, HLD.   Presents after [name] mechanical fall. She reports that while she was trying to move some packages from her porch, she fell back on to her butt and then rolled back on her back and her head. She remembers the entire fall. When she threw one of her packages, the weight of the package pulled her back to initiate the fall. She was unable to get up on her own, so she called EMS. She was on the ground for about 20 or so minutes before she was assisted up. No head injury was noted and she refused to go to the ED. She denied any pain at the time and stayed home. Saturday she began having pain at her flanks and tried her best to ignore it. It was worse with moving. Nothing relieved it. She tried to stay in bed and not move, but the pain has progressed. Her daughter insisted that she go to the ED today, so she did.  ED Course:At [facility] she was evaluated. [name] CT of the ab/pelvis revealed  an acute T12 fracture. [facility] at [facility] was called for admission.  Review of Systems:Reports back pain worse with movement. Denies numbness, tingling, LOC, incontinence.Remainder of 10 point review of systems is otherwise negative for all not mentioned in   She was admitted after [name] mechanical fall and found to have [name] T12 compression fracture.  She was admitted with back pain.  Neurosurgery was c/s and recommended pain control, TLSO brace, therapy, and outpatient follow up.  Discharged to SNF on 5/21.    Hospital Course:  Acute fracture T12/mechanical fall -Neurosurgery consulted, appreciate recs - TLSO brace, mobilize, pain control -> follow outpatient with Dr. [name] in 6 weeks for x rays - Discharge with APAP, lidocaine patch, continue prior to admission mobic, gabapentin, oxycodone prn, calcitonin -TLSO provided -PT/OTrecommend SNF - discussed with PTA today, she noted he needed min 1+ assist for transfer and was able to ambulate 15 feet yesterday - pain seems to be reasonably controlled with current regimen - Seems she's improving on current regimen -> follow.  Hopefully will be stable for d/c to SNF soon.  Hypoxia: resolved, satting 97% on [name] today.  possibly related to opiates? CXR without acute finding.  IS, flutter.  Continuous pulse ox.  Workup further as indicated.  Tachycardia: noted briefly on the pulse ox yesterday.  this has resolved.  EKG with normal sinus rhythm. Possibly related to discomfort.  Continue to monitor, seems asymptomatic.  Hyponatremia: 127 this  AM, 129 on repeat corrects to 130 with hyperglycemia-> some chronic mild hyponatremia on review on her records.  Follow outpatient.  Essential HTN -Losartan 50 mg daily  - hydralzine 25 mg TID - maybe related to pain as well, follow  - labetalol prn  - fluctuating, though improved  HLD -Simvastatin 20 mg daily  Depression/anxiety -Lexapro 5 mg daily -Remeron 15 mg daily  Neuropathy -Gabapentin 300 mg  BID  AKI vs CKD -resolved -Monitor renal function carefully -Limit nephrotoxic medication  Diarrhea: she's c/o diarrhea, has had miralax today - hold miralax for frequent bowel movements -> would resume miralax once stools slow down (she's on opiates).   Procedures:  none  Consultations:  neurosurgery  Discharge Exam: Vitals:   12/08/19 2305 12/09/19 0549  BP: 109/67 (!) 157/90  Pulse: (!) 55 67  Resp: 16 17  Temp: 97.6 F (36.4 C) 99 F (37.2 C)  SpO2: 93% 97%   Thinks she's getting better C/o having to have [name] BM Pain is better controlled.  Denies numbness, tingling, weakness. She's comfortable with plan for SNF Daughter was surprised about d/c plan - frustrated with this - discussed d/c plan and rationale  General: No acute distress.  Adjusting herself in bed, does not appear to be in significant discomfort when shifting in bed. Cardiovascular: Heart sounds show [name] regular rate, and rhythm. No gallops or rubs. No murmurs. No JVD. Lungs: Clear to auscultation bilaterally  Abdomen: Soft, nontender, nondistended Neurological: Alert and oriented 3. Moves all extremities 4. Cranial nerves II through XII grossly intact. Skin: Warm and dry. No rashes or lesions. Extremities: No clubbing or cyanosis. No edema.   Discharge Instructions   Discharge Instructions    Call MD for:  difficulty breathing, headache or visual disturbances   Complete by: As directed    Call MD for:  extreme fatigue   Complete by: As directed    Call MD for:  hives   Complete by: As directed    Call MD for:  persistant dizziness or light-headedness   Complete by: As directed    Call MD for:  persistant nausea and vomiting   Complete by: As directed    Call MD for:  redness, tenderness, or signs of infection (pain, swelling, redness, odor or green/yellow discharge around incision site)   Complete by: As directed    Call MD for:  severe uncontrolled pain   Complete by: As directed    Call MD  for:  temperature >100.4   Complete by: As directed    Diet - low sodium heart healthy   Complete by: As directed    Discharge instructions   Complete by: As directed    You were seen from pain from [name] T12 compression fracture.  You've improved with pain medications and physical therapy.  Continue to use the TLSO brace.    Follow up with Dr. [name] in 6 weeks for follow up x rays.  Return for new, recurrent, or worsening symptoms.  Please ask your PCP to request records from this hospitalization so they know what was done and what the next steps will be.   Increase activity slowly   Complete by: As directed      Allergies as of 12/09/2019      Reactions   Cartia Xt [diltiazem] Other (See Comments)   BRADYCARDIA IN 40'S   Codeine Other (See Comments)   syncope   Lisinopril-hydrochlorothiazide Cough   Mercury Rash   Sulfa Antibiotics  Sulfonamide Derivatives Rash   swelling      Medication List    STOP taking these medications   amLODipine 5 MG tablet Commonly known as: NORVASC   predniSONE 20 MG tablet Commonly known as: DELTASONE     TAKE these medications   acetaminophen 325 MG tablet Commonly known as: TYLENOL Take 650 mg by mouth every 4 (four) hours as needed for mild pain, moderate pain, fever or headache.   bisacodyl 5 MG EC tablet Commonly known as: DULCOLAX Take 1 tablet (5 mg total) by mouth daily as needed for moderate constipation.   calcitonin (salmon) 200 UNIT/ACT nasal spray Commonly known as: MIACALCIN/FORTICAL Place 1 spray into alternate nostrils daily for 14 days. Start taking on: Dec 10, 2019   docusate sodium 100 MG capsule Commonly known as: COLACE Take 2 capsules (200 mg total) by mouth 2 (two) times daily as needed for mild constipation.   escitalopram 5 MG tablet Commonly known as: LEXAPRO Take 5 mg by mouth in the morning and at bedtime.   feeding supplement (ENSURE ENLIVE) Liqd Take 237 mLs by mouth 2 (two) times daily between  meals. Give 2 week supply   gabapentin 300 MG capsule Commonly known as: NEURONTIN Take 300 mg by mouth in the morning and at bedtime.   hydrALAZINE 25 MG tablet Commonly known as: APRESOLINE Take 1 tablet (25 mg total) by mouth every 8 (eight) hours.   lidocaine 5 % Commonly known as: LIDODERM Place 1 patch onto the skin daily. Remove & Discard patch within 12 hours or as directed by MD Start taking on: Dec 10, 2019   losartan 50 MG tablet Commonly known as: COZAAR Take 50 mg by mouth daily.   meloxicam 7.5 MG tablet Commonly known as: MOBIC Take 7.5 mg by mouth 2 (two) times daily.   mirtazapine 15 MG tablet Commonly known as: REMERON Take 15 mg by mouth at bedtime. What changed: Another medication with the same name was removed. Continue taking this medication, and follow the directions you see here.   omeprazole 20 MG capsule Commonly known as: PRILOSEC Take 1 capsule (20 mg total) by mouth daily.   oxyCODONE 5 MG immediate release tablet Commonly known as: Oxy IR/ROXICODONE Take 1 tablet (5 mg total) by mouth every 4 (four) hours as needed for up to 3 days for severe pain.   polyethylene glycol 17 g packet Commonly known as: MIRALAX / GLYCOLAX Take 17 g by mouth 2 (two) times daily for 14 days.   simvastatin 20 MG tablet Commonly known as: ZOCOR TAKE 1 TABLET EVERY DAY What changed: when to take this   Vitamin D3 50 MCG (2000 UT) Tabs Take 2,000 Units by mouth daily.      Allergies  Allergen Reactions  . Cartia Xt [Diltiazem] Other (See Comments)    BRADYCARDIA IN 40'S  . Codeine Other (See Comments)    syncope  . Lisinopril-Hydrochlorothiazide Cough  . Mercury Rash  . Sulfa Antibiotics   . Sulfonamide Derivatives Rash    swelling    Contact information for follow-up providers    [name] Mons, PA Follow up.   Specialty: Family Medicine Contact information: [place] [address] [phone]        [name] Larsson, MD Follow up.   Specialty: Neurosurgery Why: Follow up for T12 fracture Contact information: [address] [place] [address] [place] [address] [phone]  Contact information for after-discharge care    Destination    [facility] Preferred SNF .   Service: Skilled Nursing Contact information: [place] [phone]                   The results of significant diagnostics from this hospitalization (including imaging, microbiology, ancillary and laboratory) are listed below for reference.    Significant Diagnostic Studies: CT ABDOMEN PELVIS WO CONTRAST  Result Date: 12/04/2019 CLINICAL DATA:  Abdominal trauma. Flank pain. EXAM: CT ABDOMEN AND PELVIS WITHOUT CONTRAST TECHNIQUE: Multidetector CT imaging of the abdomen and pelvis was performed following the standard protocol without IV contrast. COMPARISON:  02/15/2016. FINDINGS: Lower chest: Peripheral predominant interstitial reticulation is noted in both lower lobes. No acute abnormality. Hepatobiliary: Within the limitations of unenhanced technique there is no evidence for up attic injury or perihepatic hematoma. Benign calcification noted within the dome of liver measuring 7 mm. Small low attenuation structure within posterior right hepatic lobe measures 5 mm and is too small to characterize. The gallbladder is unremarkable. No biliary dilatation. Pancreas: Unremarkable. No pancreatic ductal dilatation or surrounding inflammatory changes. Spleen: No splenic injury or perisplenic hematoma. Adrenals/Urinary Tract: No adrenal hemorrhage or renal injury identified. Bladder is unremarkable. Stomach/Bowel: Small hiatal hernia. Stomach nondistended. No evidence of bowel wall thickening, distention, or inflammatory changes. Predominantly is left-sided colonic diverticulosis noted without acute inflammation. Vascular/Lymphatic: Aortic atherosclerosis. No  aneurysm. No abdominopelvic adenopathy. Reproductive: Uterus and bilateral adnexa are unremarkable. Other: No abdominal wall hernia or abnormality. No abdominopelvic ascites. Musculoskeletal: Acute fracture deformity is identified involving the T12 vertebra with approximately 4 mm of retropulsion of fracture fragments. There is loss of approximately 50% of the vertebral body height. The remaining lumbar vertebral body heights are well preserved. Multi level degenerative disc disease identified within the visualized thoracolumbar spine. Bilateral facet degenerative change noted at L4-5 and L5-S1. Previous right hip arthroplasty. No additional fractures identified. IMPRESSION: 1. Acute fracture deformity involving the T12 vertebra with approximately 4 mm of retropulsion of fracture fragments. 2. Aortic atherosclerosis. 3. Left-sided colonic diverticulosis without acute inflammation. Aortic Atherosclerosis (ICD10-I70.0). Electronically Signed   By: Kerby Moors M.D.   On: 12/04/2019 12:50   CT L-SPINE NO CHARGE  Result Date: 12/04/2019 CLINICAL DATA:  [place] 2 days ago at home.  Low back pain. EXAM: CT LUMBAR SPINE WITHOUT CONTRAST TECHNIQUE: Multidetector CT imaging of the lumbar spine was performed without intravenous contrast administration. Multiplanar CT image reconstructions were also generated. COMPARISON:  02/14/2016 FINDINGS: Segmentation: 5 lumbar type vertebral bodies. Alignment: 2 mm degenerative anterolisthesis L4-5 and L5-S1. Vertebrae: Acute compression fracture of the T12 vertebral body with loss of height of 50%. Posterior bowing of the posterior margin of the vertebral body by 5 mm encroaches upon the spinal canal. No posterior element fracture. Paraspinal and other soft tissues: See results of abdominal CT. Disc levels: No significant disc space finding at T11-12, T12-L1 or L1-2. L2-3: Disc bulge. Facet and ligamentous hypertrophy. Moderate stenosis. L3-4: Disc bulge.  Facet hypertrophy.   Moderate stenosis. L4-5: Facet arthropathy with 2 mm of anterolisthesis. Disc bulge. Severe multifactorial stenosis. L5-S1: Facet arthropathy with 2 mm of anterolisthesis. Disc bulge. Mild stenosis of the lateral recesses and foramina. IMPRESSION: Acute compression fracture at T12 with loss of height of 50%. Posterior bowing of the posterosuperior margin of the vertebral body by 5 mm encroaches upon the spinal canal. No sign that this is anything other than Jamarri Vuncannon benign fracture.  Lower lumbar degenerative disc disease and degenerative facet disease. Severe multifactorial spinal stenosis at L4-5. Moderate stenosis at L2-3 and L3-4. Lateral recess and foraminal stenosis at L5-S1. Electronically Signed   By: Nelson Chimes M.D.   On: 12/04/2019 12:31   DG CHEST PORT 1 VIEW  Result Date: 12/07/2019 CLINICAL DATA:  [place], T12 fracture on recent CT EXAM: PORTABLE CHEST 1 VIEW COMPARISON:  12/04/2019, 02/14/2016 FINDINGS: Single frontal view of the chest demonstrates an unremarkable cardiac silhouette. No airspace disease, effusion, or pneumothorax. The T12 compression fracture seen on recent CT is not adequately visualized on this portable exam. No other acute bony abnormalities. IMPRESSION: 1. No acute intrathoracic process. Electronically Signed   By: Randa Ngo M.D.   On: 12/07/2019 18:13    Microbiology: Recent Results (from the past 240 hour(s))  SARS Coronavirus 2 by RT PCR (hospital order, performed in [facility] hospital lab) Nasopharyngeal Nasopharyngeal Swab     Status: None   Collection Time: 12/04/19  2:49 PM   Specimen: Nasopharyngeal Swab  Result Value Ref Range Status   SARS Coronavirus 2 NEGATIVE NEGATIVE Final    Comment: (NOTE) SARS-CoV-2 target nucleic acids are NOT DETECTED. The SARS-CoV-2 RNA is generally detectable in upper and lower respiratory specimens during the acute phase of infection. The lowest concentration of SARS-CoV-2 viral copies this assay can detect is 250 copies / mL.  Emanual Lamountain negative result does not preclude SARS-CoV-2 infection and should not be used as the sole basis for treatment or other patient management decisions.  Maudell Stanbrough negative result may occur with improper specimen collection / handling, submission of specimen other than nasopharyngeal swab, presence of viral mutation(s) within the areas targeted by this assay, and inadequate number of viral copies (<250 copies / mL). Jaxsyn Catalfamo negative result must be combined with clinical observations, patient history, and epidemiological information. Fact Sheet for Patients:   StrictlyIdeas.no Fact Sheet for Healthcare Providers: BankingDealers.co.za This test is not yet approved or cleared  by the [place] FDA and has been authorized for detection and/or diagnosis of SARS-CoV-2 by FDA under an Emergency Use Authorization (EUA).  This EUA will remain in effect (meaning this test can be used) for the duration of the COVID-19 declaration under Section 564(b)(1) of the Act, 21 U.S.C. section 360bbb-3(b)(1), unless the authorization is terminated or revoked sooner. Performed at [facility], [address] [place], [place] [address]   SARS CORONAVIRUS 2 (TAT 6-24 HRS) Nasopharyngeal Nasopharyngeal Swab     Status: None   Collection Time: 12/08/19  4:22 AM   Specimen: Nasopharyngeal Swab  Result Value Ref Range Status   SARS Coronavirus 2 NEGATIVE NEGATIVE Final    Comment: (NOTE) SARS-CoV-2 target nucleic acids are NOT DETECTED. The SARS-CoV-2 RNA is generally detectable in upper and lower respiratory specimens during the acute phase of infection. Negative results do not preclude SARS-CoV-2 infection, do not rule out co-infections with other pathogens, and should not be used as the sole basis for treatment or other patient management decisions. Negative results must be combined with clinical observations, patient history, and epidemiological information.  The expected result is Negative. Fact Sheet for Patients: SugarRoll.be Fact Sheet for Healthcare Providers: https://www.woods-mathews.com/ This test is not yet approved or cleared by the [place] FDA and  has been authorized for detection and/or diagnosis of SARS-CoV-2 by FDA under an Emergency Use Authorization (EUA). This EUA will remain  in effect (meaning this test can be used) for the duration of the COVID-19  declaration under Section 56 4(b)(1) of the Act, 21 U.S.C. section 360bbb-3(b)(1), unless the authorization is terminated or revoked sooner. Performed at [facility], [place] [address], [place], [place] [address]      Labs: Basic Metabolic Panel: Recent Labs  Lab 12/05/19 0251 12/06/19 0310 12/08/19 0328 12/09/19 0256 12/09/19 0949  NA 136 132* 134* 127* 129*  K 3.3* 4.2 3.4* 4.3 4.1  CL 97* 99 97* 92* 93*  CO2 28 24 29 28 25   GLUCOSE 141* 110* 109* 95 165*  BUN 34* 31* 17 21 18   CREATININE 1.55* 0.97 0.76 0.73 0.83  CALCIUM 8.2* 8.3* 8.6* 8.6* 8.7*  MG  --  1.7  --  1.7  --   PHOS  --  3.7  --  3.8  --    Liver Function Tests: Recent Labs  Lab 12/05/19 0251 12/06/19 0310 12/08/19 0328 12/09/19 0256  AST 38 28 19 24   ALT 18 19 17 20   ALKPHOS 86 118 157* 158*  BILITOT 0.4 0.5 0.6 0.5  PROT 5.8* 6.0* 5.9* 6.0*  ALBUMIN 2.8* 2.8* 2.7* 2.7*   No results for input(s): LIPASE, AMYLASE in the last 168 hours. No results for input(s): AMMONIA in the last 168 hours. CBC: Recent Labs  Lab 12/04/19 1047 12/05/19 0251 12/06/19 0310 12/08/19 0328 12/09/19 0256  WBC 15.8* 10.9* 11.0* 11.1* 11.1*  NEUTROABS  --   --  7.9* 8.1* 7.1  HGB 12.6 9.6* 10.0* 10.9* 11.4*  HCT 39.8 30.9* 32.3* 34.7* 36.8  MCV 92.8 94.8 95.3 94.3 94.1  PLT 299 216 192 239 266   Cardiac Enzymes: No results for input(s): CKTOTAL, CKMB, CKMBINDEX, TROPONINI in the last 168 hours. BNP: BNP (last 3 results) No results for input(s):  BNP in the last 8760 hours.  ProBNP (last 3 results) No results for input(s): PROBNP in the last 8760 hours.  CBG: No results for input(s): GLUCAP in the last 168 hours.     Signed:  Fayrene Helper MD.  [facility] 12/09/2019, 2:18 PM

## 2019-12-09 NOTE — Progress Notes (Signed)
Verbal report given to Currie Paris at [facility]. All questions were answered.

## 2019-12-09 NOTE — Progress Notes (Signed)
Received message from Gandy, she is still waiting for pt's daughter to complete the paperwork and asked if she could be there at 2:30. Contacted pt's daughter again and left a VM. Informed RN that pt cannot be D/C until pt's daughter completes the paperwork for SNF.

## 2019-12-09 NOTE — Progress Notes (Signed)
[name] daughter returned my call. She reports that nobody informed her that her mom is ready to be D/C. She reports that her mother is not ready to be D/C. Informed daughter that per MD message, the plan is to D/C pt today and I'll notify the doctor about her concerns.

## 2019-12-09 NOTE — Progress Notes (Signed)
Writer called twice for report, call is not answered. Will attempt again.

## 2019-12-09 NOTE — Progress Notes (Signed)
The patient is alert and oriented and has been seen by her physician. The orders for discharge were written and reviewed with patient on day shift. IV had also been taken out. Went over questions related to discharge with patient. She is being transported to [facility] via [facility] with all of her belongings.

## 2019-12-09 NOTE — Progress Notes (Signed)
Physical Therapy Treatment Patient Details Name: [name] MRN: [id number] DOB: 1923/06/02 Today's Date: 12/09/2019    History of Present Illness 86 yo female admitted with T12 compression fx after sustaining a fall at home. Hx of osteoporosis, spinal stenosis, R THA, neuropathy, polymyalgia rheumatica    PT Comments    Assisted OOB to [facility] required care and increased time.   General bed mobility comments: 75% VC's on proper "log roll" tech and increased time due to increased back pain with activity.  Assisted OOB and back to bed then positioned to comfort. General transfer comment: Assist to power up, stabilize, control descent. 50% VCs safety, hand placement. Increased time.  Assisted on/off BSC. General Gait Details: unable to attempt amb this session due to increased pain.  Poor forward flex posture. Assisted with peri care as pt was unable to perform.  Assisted back to bed.  Pt will need ST Rehab at SNF prior to safe return to home.    Follow Up Recommendations  SNF     Equipment Recommendations  None recommended by PT    Recommendations for Other Services       Precautions / Restrictions Precautions Precautions: Fall Precaution Comments: back precautions Required Braces or Orthoses: Spinal Brace Spinal Brace: Thoracolumbosacral orthotic;Applied in sitting position Restrictions Weight Bearing Restrictions: No Other Position/Activity Restrictions: WBAT    Mobility  Bed Mobility Overal bed mobility: Needs Assistance Bed Mobility: Rolling;Sidelying to Sit Rolling: Min assist;Mod assist Sidelying to sit: Mod assist;Max assist       General bed mobility comments: 75% VC's on proper "log roll" tech and increased time due to increased back pain with activity.  Assisted OOB and back to bed then positioned to comfort.  Transfers Overall transfer level: Needs assistance Equipment used: Rolling walker (2 wheeled) Transfers: Sit to/from [facility] Sit to  Stand: Min assist Stand pivot transfers: Min assist;Mod assist       General transfer comment: Assist to power up, stabilize, control descent. 50% VCs safety, hand placement. Increased time.  Assisted on/off BSC.  Ambulation/Gait             General Gait Details: unable to attempt amb this session due to increased pain.  Poor forward flex posture.   Stairs             Wheelchair Mobility    Modified Rankin (Stroke Patients Only)       Balance                                            Cognition   Behavior During Therapy: WFL for tasks assessed/performed Overall Cognitive Status: Within Functional Limits for tasks assessed                                 General Comments: very sweet      Exercises      General Comments        Pertinent Vitals/Pain Pain Assessment: 0-10 Pain Score: 9  Pain Location: back Pain Descriptors / Indicators: Aching;Sore;Discomfort;Grimacing Pain Intervention(s): Monitored during session;Premedicated before session;Repositioned    Home Living                      Prior Function            PT Goals (  current goals can now be found in the care plan section) Progress towards PT goals: Progressing toward goals    Frequency    Min 3X/week      PT Plan Current plan remains appropriate    Co-evaluation              AM-PAC PT "6 Clicks" Mobility   Outcome Measure  Help needed turning from your back to your side while in a flat bed without using bedrails?: A Little Help needed moving from lying on your back to sitting on the side of a flat bed without using bedrails?: A Little Help needed moving to and from a bed to a chair (including a wheelchair)?: A Little Help needed standing up from a chair using your arms (e.g., wheelchair or bedside chair)?: A Little Help needed to walk in hospital room?: A Little Help needed climbing 3-5 steps with a railing? : A Lot 6 Click  Score: 17    End of Session Equipment Utilized During Treatment: Gait belt Activity Tolerance: Patient limited by fatigue Patient left: in bed;with call bell/phone within reach;with bed alarm set Nurse Communication: Mobility status PT Visit Diagnosis: Muscle weakness (generalized) (M62.81);Pain;Difficulty in walking, not elsewhere classified (R26.2);History of falling (Z91.81)     Time: [id number] PT Time Calculation (min) (ACUTE ONLY): 23 min  Charges:  $Therapeutic Activity: 23-37 mins                     {Lori Kropski  PTA Acute  Rehabilitation Services Pager      [phone] Office      [phone]

## 2019-12-13 ENCOUNTER — Ambulatory Visit: Payer: Medicare HMO | Admitting: Psychology

## 2019-12-20 ENCOUNTER — Ambulatory Visit: Payer: Medicare HMO | Admitting: Psychology

## 2019-12-21 ENCOUNTER — Ambulatory Visit: Payer: Medicare HMO | Admitting: Psychology

## 2019-12-27 ENCOUNTER — Ambulatory Visit: Payer: Medicare HMO | Admitting: Psychology

## 2019-12-31 ENCOUNTER — Encounter (HOSPITAL_BASED_OUTPATIENT_CLINIC_OR_DEPARTMENT_OTHER): Payer: Self-pay | Admitting: Emergency Medicine

## 2019-12-31 ENCOUNTER — Other Ambulatory Visit: Payer: Self-pay

## 2019-12-31 ENCOUNTER — Observation Stay (HOSPITAL_COMMUNITY): Payer: Medicare HMO

## 2019-12-31 ENCOUNTER — Inpatient Hospital Stay (HOSPITAL_BASED_OUTPATIENT_CLINIC_OR_DEPARTMENT_OTHER)
Admission: EM | Admit: 2019-12-31 | Discharge: 2020-01-06 | DRG: 552 | Disposition: A | Discharge status: Skilled Nursing Facility | Payer: Medicare HMO | Attending: Student | Admitting: Student

## 2019-12-31 DIAGNOSIS — R52 Pain, unspecified: Secondary | ICD-10-CM

## 2019-12-31 DIAGNOSIS — Z791 Long term (current) use of non-steroidal anti-inflammatories (NSAID): Secondary | ICD-10-CM

## 2019-12-31 DIAGNOSIS — Z833 Family history of diabetes mellitus: Secondary | ICD-10-CM

## 2019-12-31 DIAGNOSIS — M5459 Other low back pain: Secondary | ICD-10-CM

## 2019-12-31 DIAGNOSIS — W19XXXA Unspecified fall, initial encounter: Secondary | ICD-10-CM | POA: Diagnosis present

## 2019-12-31 DIAGNOSIS — E785 Hyperlipidemia, unspecified: Secondary | ICD-10-CM | POA: Diagnosis present

## 2019-12-31 DIAGNOSIS — I10 Essential (primary) hypertension: Secondary | ICD-10-CM | POA: Diagnosis not present

## 2019-12-31 DIAGNOSIS — S22080A Wedge compression fracture of T11-T12 vertebra, initial encounter for closed fracture: Secondary | ICD-10-CM | POA: Diagnosis present

## 2019-12-31 DIAGNOSIS — K5900 Constipation, unspecified: Secondary | ICD-10-CM | POA: Diagnosis present

## 2019-12-31 DIAGNOSIS — M8728 Osteonecrosis due to previous trauma, other site: Secondary | ICD-10-CM | POA: Diagnosis present

## 2019-12-31 DIAGNOSIS — Z7189 Other specified counseling: Secondary | ICD-10-CM | POA: Diagnosis not present

## 2019-12-31 DIAGNOSIS — Z515 Encounter for palliative care: Secondary | ICD-10-CM

## 2019-12-31 DIAGNOSIS — S22088A Other fracture of T11-T12 vertebra, initial encounter for closed fracture: Secondary | ICD-10-CM | POA: Diagnosis not present

## 2019-12-31 DIAGNOSIS — Z79899 Other long term (current) drug therapy: Secondary | ICD-10-CM

## 2019-12-31 DIAGNOSIS — M810 Age-related osteoporosis without current pathological fracture: Secondary | ICD-10-CM | POA: Diagnosis present

## 2019-12-31 DIAGNOSIS — Z20822 Contact with and (suspected) exposure to covid-19: Secondary | ICD-10-CM | POA: Diagnosis present

## 2019-12-31 DIAGNOSIS — D649 Anemia, unspecified: Secondary | ICD-10-CM | POA: Diagnosis present

## 2019-12-31 DIAGNOSIS — Z66 Do not resuscitate: Secondary | ICD-10-CM | POA: Diagnosis not present

## 2019-12-31 DIAGNOSIS — M4804 Spinal stenosis, thoracic region: Secondary | ICD-10-CM | POA: Diagnosis present

## 2019-12-31 DIAGNOSIS — Z823 Family history of stroke: Secondary | ICD-10-CM

## 2019-12-31 DIAGNOSIS — M545 Low back pain, unspecified: Secondary | ICD-10-CM | POA: Diagnosis present

## 2019-12-31 DIAGNOSIS — E871 Hypo-osmolality and hyponatremia: Secondary | ICD-10-CM | POA: Diagnosis not present

## 2019-12-31 DIAGNOSIS — G8929 Other chronic pain: Secondary | ICD-10-CM | POA: Diagnosis present

## 2019-12-31 DIAGNOSIS — Z888 Allergy status to other drugs, medicaments and biological substances status: Secondary | ICD-10-CM

## 2019-12-31 DIAGNOSIS — I959 Hypotension, unspecified: Secondary | ICD-10-CM | POA: Diagnosis not present

## 2019-12-31 DIAGNOSIS — Z87891 Personal history of nicotine dependence: Secondary | ICD-10-CM

## 2019-12-31 DIAGNOSIS — M5124 Other intervertebral disc displacement, thoracic region: Secondary | ICD-10-CM | POA: Diagnosis present

## 2019-12-31 DIAGNOSIS — Z96641 Presence of right artificial hip joint: Secondary | ICD-10-CM | POA: Diagnosis present

## 2019-12-31 LAB — CBC WITH DIFFERENTIAL/PLATELET
Abs Immature Granulocytes: 0.02 10*3/uL (ref 0.00–0.07)
Abs Immature Granulocytes: 0.02 10*3/uL (ref 0.00–0.07)
Basophils Absolute: 0.1 10*3/uL (ref 0.0–0.1)
Basophils Absolute: 0.1 10*3/uL (ref 0.0–0.1)
Basophils Relative: 1 %
Basophils Relative: 1 %
Eosinophils Absolute: 0.7 10*3/uL — ABNORMAL HIGH (ref 0.0–0.5)
Eosinophils Absolute: 0.7 10*3/uL — ABNORMAL HIGH (ref 0.0–0.5)
Eosinophils Relative: 9 %
Eosinophils Relative: 12 %
HCT: 37 % (ref 36.0–46.0)
HCT: 32.6 % — ABNORMAL LOW (ref 36.0–46.0)
Hemoglobin: 11.5 g/dL — ABNORMAL LOW (ref 12.0–15.0)
Hemoglobin: 9.9 g/dL — ABNORMAL LOW (ref 12.0–15.0)
Immature Granulocytes: 0 %
Immature Granulocytes: 0 %
Lymphocytes Relative: 19 %
Lymphocytes Relative: 24 %
Lymphs Abs: 1.4 10*3/uL (ref 0.7–4.0)
Lymphs Abs: 1.4 10*3/uL (ref 0.7–4.0)
MCH: 29.6 pg (ref 26.0–34.0)
MCH: 29.3 pg (ref 26.0–34.0)
MCHC: 31.1 g/dL (ref 30.0–36.0)
MCHC: 30.4 g/dL (ref 30.0–36.0)
MCV: 95.1 fL (ref 80.0–100.0)
MCV: 96.4 fL (ref 80.0–100.0)
Monocytes Absolute: 0.8 10*3/uL (ref 0.1–1.0)
Monocytes Absolute: 0.7 10*3/uL (ref 0.1–1.0)
Monocytes Relative: 11 %
Monocytes Relative: 11 %
Neutro Abs: 4.5 10*3/uL (ref 1.7–7.7)
Neutro Abs: 3 10*3/uL (ref 1.7–7.7)
Neutrophils Relative %: 60 %
Neutrophils Relative %: 52 %
Platelets: 277 10*3/uL (ref 150–400)
Platelets: 264 10*3/uL (ref 150–400)
RBC: 3.89 MIL/uL (ref 3.87–5.11)
RBC: 3.38 MIL/uL — ABNORMAL LOW (ref 3.87–5.11)
RDW: 13.9 % (ref 11.5–15.5)
RDW: 13.8 % (ref 11.5–15.5)
WBC: 7.5 10*3/uL (ref 4.0–10.5)
WBC: 5.9 10*3/uL (ref 4.0–10.5)
nRBC: 0 % (ref 0.0–0.2)
nRBC: 0 % (ref 0.0–0.2)

## 2019-12-31 LAB — BASIC METABOLIC PANEL
Anion gap: 11 (ref 5–15)
BUN: 23 mg/dL (ref 8–23)
CO2: 27 mmol/L (ref 22–32)
Calcium: 9.5 mg/dL (ref 8.9–10.3)
Chloride: 96 mmol/L — ABNORMAL LOW (ref 98–111)
Creatinine, Ser: 0.97 mg/dL (ref 0.44–1.00)
GFR calc Af Amer: 58 mL/min — ABNORMAL LOW (ref >60)
GFR calc non Af Amer: 50 mL/min — ABNORMAL LOW (ref >60)
Glucose, Bld: 109 mg/dL — ABNORMAL HIGH (ref 70–99)
Potassium: 3.9 mmol/L (ref 3.5–5.1)
Sodium: 134 mmol/L — ABNORMAL LOW (ref 135–145)

## 2019-12-31 LAB — COMPREHENSIVE METABOLIC PANEL
ALT: 14 U/L (ref 0–44)
AST: 18 U/L (ref 15–41)
Albumin: 3 g/dL — ABNORMAL LOW (ref 3.5–5.0)
Alkaline Phosphatase: 97 U/L (ref 38–126)
Anion gap: 8 (ref 5–15)
BUN: 21 mg/dL (ref 8–23)
CO2: 28 mmol/L (ref 22–32)
Calcium: 9.2 mg/dL (ref 8.9–10.3)
Chloride: 100 mmol/L (ref 98–111)
Creatinine, Ser: 1.04 mg/dL — ABNORMAL HIGH (ref 0.44–1.00)
GFR calc Af Amer: 53 mL/min — ABNORMAL LOW (ref >60)
GFR calc non Af Amer: 46 mL/min — ABNORMAL LOW (ref >60)
Glucose, Bld: 108 mg/dL — ABNORMAL HIGH (ref 70–99)
Potassium: 3.9 mmol/L (ref 3.5–5.1)
Sodium: 136 mmol/L (ref 135–145)
Total Bilirubin: 0.4 mg/dL (ref 0.3–1.2)
Total Protein: 5.8 g/dL — ABNORMAL LOW (ref 6.5–8.1)

## 2019-12-31 LAB — GLUCOSE, CAPILLARY: Glucose-Capillary: 103 mg/dL — ABNORMAL HIGH (ref 70–99)

## 2019-12-31 LAB — SARS CORONAVIRUS 2 BY RT PCR (HOSPITAL ORDER, PERFORMED IN ~~LOC~~ HOSPITAL LAB): SARS Coronavirus 2: NEGATIVE

## 2019-12-31 MED ORDER — LIDOCAINE 5 % EX PTCH
2.0000 | MEDICATED_PATCH | CUTANEOUS | Status: DC
  Administered 2020-01-01 – 2020-01-06 (×6): 2 via TRANSDERMAL
  Filled 2019-12-31 (×4): qty 2

## 2019-12-31 MED ORDER — ONDANSETRON HCL 4 MG PO TABS: 4.0000 mg | ORAL_TABLET | Freq: Four times a day (QID) | ORAL | Status: DC | PRN

## 2019-12-31 MED ORDER — CYCLOBENZAPRINE HCL 10 MG PO TABS
5.0000 mg | ORAL_TABLET | Freq: Three times a day (TID) | ORAL | Status: DC | PRN
  Administered 2019-12-31 – 2020-01-06 (×8): 5 mg via ORAL
  Filled 2019-12-31 (×8): qty 1

## 2019-12-31 MED ORDER — LIDOCAINE 5 % EX PTCH
1.0000 | MEDICATED_PATCH | CUTANEOUS | Status: DC
  Administered 2019-12-31: 1 via TRANSDERMAL
  Filled 2019-12-31: qty 1

## 2019-12-31 MED ORDER — HYDROMORPHONE HCL 1 MG/ML IJ SOLN
1.0000 mg | Freq: Once | INTRAMUSCULAR | Status: AC
  Administered 2019-12-31: 1 mg via INTRAVENOUS
  Filled 2019-12-31: qty 1

## 2019-12-31 MED ORDER — SENNOSIDES-DOCUSATE SODIUM 8.6-50 MG PO TABS
2.0000 | ORAL_TABLET | Freq: Two times a day (BID) | ORAL | Status: DC
  Administered 2019-12-31 – 2020-01-05 (×9): 2 via ORAL
  Filled 2019-12-31 (×10): qty 2

## 2019-12-31 MED ORDER — ACETAMINOPHEN 500 MG PO TABS
1000.0000 mg | ORAL_TABLET | Freq: Three times a day (TID) | ORAL | Status: DC
  Administered 2019-12-31 – 2020-01-06 (×18): 1000 mg via ORAL
  Filled 2019-12-31 (×18): qty 2

## 2019-12-31 MED ORDER — DEXTROSE-NACL 5-0.9 % IV SOLN: INTRAVENOUS | Status: DC

## 2019-12-31 MED ORDER — GABAPENTIN 300 MG PO CAPS
300.0000 mg | ORAL_CAPSULE | Freq: Two times a day (BID) | ORAL | Status: DC
  Administered 2019-12-31 – 2020-01-01 (×3): 300 mg via ORAL
  Filled 2019-12-31 (×3): qty 1

## 2019-12-31 MED ORDER — OXYCODONE HCL 5 MG PO TABS
5.0000 mg | ORAL_TABLET | Freq: Three times a day (TID) | ORAL | Status: DC | PRN
  Administered 2019-12-31 – 2020-01-04 (×5): 5 mg via ORAL
  Filled 2019-12-31 (×5): qty 1

## 2019-12-31 MED ORDER — ONDANSETRON HCL 4 MG/2ML IJ SOLN
4.0000 mg | Freq: Four times a day (QID) | INTRAMUSCULAR | Status: DC | PRN
  Administered 2020-01-05: 4 mg via INTRAVENOUS
  Filled 2019-12-31: qty 2

## 2019-12-31 MED ORDER — ENOXAPARIN SODIUM 40 MG/0.4ML ~~LOC~~ SOLN
40.0000 mg | SUBCUTANEOUS | Status: DC
  Administered 2019-12-31: 40 mg via SUBCUTANEOUS
  Filled 2019-12-31: qty 0.4

## 2019-12-31 MED ORDER — OXYCODONE HCL 5 MG PO TABS
5.0000 mg | ORAL_TABLET | Freq: Three times a day (TID) | ORAL | Status: DC | PRN
  Administered 2019-12-31: 5 mg via ORAL
  Filled 2019-12-31: qty 1

## 2019-12-31 MED ORDER — SENNOSIDES-DOCUSATE SODIUM 8.6-50 MG PO TABS: 1.0000 | ORAL_TABLET | Freq: Two times a day (BID) | ORAL | Status: DC | PRN

## 2019-12-31 MED ORDER — HYDROMORPHONE HCL 1 MG/ML IJ SOLN
0.5000 mg | INTRAMUSCULAR | Status: DC | PRN
  Administered 2019-12-31 – 2020-01-01 (×4): 0.5 mg via INTRAVENOUS
  Filled 2019-12-31 (×4): qty 1

## 2019-12-31 MED ORDER — HYDRALAZINE HCL 20 MG/ML IJ SOLN: 10.0000 mg | INTRAMUSCULAR | Status: DC | PRN

## 2019-12-31 MED ORDER — POLYETHYLENE GLYCOL 3350 17 G PO PACK: 17.0000 g | PACK | Freq: Two times a day (BID) | ORAL | Status: DC | PRN

## 2019-12-31 NOTE — Progress Notes (Signed)
Spoke to patient's son and daughter, was questioning the CT versus MRI.  Took son's number Cherlynn Kaiser @ [phone]).  Notified Dr. Cyndia Skeeters family request to speak to him.

## 2019-12-31 NOTE — ED Notes (Signed)
Report given to: Brad, RN with [facility]; eta 15-20 minutes.

## 2019-12-31 NOTE — Progress Notes (Signed)
Daughter called again, updated.

## 2019-12-31 NOTE — Plan of Care (Signed)
CT lumbar spine result: Known comminuted T12 body fracture with progressive height loss and retropulsion since 12/04/2019. No signs of interval healing and related spinal stenosis is advanced.  Discussed finding with patient's daughter and son over the phone.  Neurosurgery consulted.  Discontinued Lovenox and restarted SCD.  Strict bedrest and n.p.o.  Start gentle IV fluid.

## 2019-12-31 NOTE — H&P (Signed)
History and Physical    [name]:[id number] DOB: 1922-09-07 DOA: 12/31/2019  PCP: Harrison Mons, PA  Patient coming from: Home.  Chief Complaint: Low back pain.  HPI: [name] is a 86 y.o. female with history of hypertension hyperlipidemia was recently admitted for T12 compression fracture for which neurosurgery recommended conservative management with TLSO brace was eventually discharged to rehab.  Patient states he was doing well until 24 hours ago when patient's pain worsened suddenly.  Pain is radiating to both of the lower extremities.  Denies any incontinence of urine or bowel.  ED Course: In the ER patient was given multiple doses of pain relief medications despite which patient was still having pain and admitted for further management.  On the way to the hospital patient did receive some Dilaudid following which patient is mildly lethargic but is able to answer questions.  Is able to move all extremities labs show mild anemia of 11.5.  Covid test negative sodium 134.  Review of Systems: As per HPI, rest all negative.   Past Medical History:  Diagnosis Date  . Allergy   . Arthritis   . Depression   . Endometriosis   . GERD (gastroesophageal reflux disease)   . Hearing loss 12/08/2011   bil, hearing aids  . Hyperlipidemia   . Hypertension   . Osteoporosis   . Ovarian cyst   . Polymyalgia rheumatica ([place])   . Post herpetic neuralgia   . S/P right THA, AA 12/16/2011  . Shingles 2020  . Skin cancer   . Spinal stenosis     Past Surgical History:  Procedure Laterality Date  . CATARACT EXTRACTION, BILATERAL  12-08-11   bil  . DILATION AND CURETTAGE OF UTERUS    . HERNIA REPAIR    . HYSTEROSCOPY    . OOPHORECTOMY     bilateral  . STERIOD INJECTION  12/09/11   to spine for rt calf pain and spasms  . TIBIA FRACTURE SURGERY     trauma  . TONSILLECTOMY AND ADENOIDECTOMY    . TOTAL HIP ARTHROPLASTY  12/16/2011   Procedure: TOTAL HIP ARTHROPLASTY ANTERIOR  APPROACH;  Surgeon: Mauri Pole, MD;  Location: [facility];  Service: Orthopedics;  Laterality: Right;     reports that she has quit smoking. She has never used smokeless tobacco. She reports that she does not drink alcohol and does not use drugs.  Allergies  Allergen Reactions  . Cartia Xt [Diltiazem] Other (See Comments)    BRADYCARDIA IN 40'S  . Codeine Other (See Comments)    syncope  . Lisinopril-Hydrochlorothiazide Cough  . Mercury Rash  . Sulfa Antibiotics   . Sulfonamide Derivatives Rash    swelling    Family History  Problem Relation Age of Onset  . Stroke Sister   . Diabetes Daughter   . Cancer Father        esophogeal  . Cancer Paternal Aunt        stomach  . Breast cancer Maternal Grandmother        Age 62  . Stroke Mother     Prior to Admission medications   Medication Sig Start Date End Date Taking? Authorizing Provider  acetaminophen (TYLENOL) 325 MG tablet Take 650 mg by mouth every 4 (four) hours as needed for mild pain, moderate pain, fever or headache.    Yes [provider]  bisacodyl (DULCOLAX) 5 MG EC tablet Take 1 tablet (5 mg total) by mouth daily as needed for  moderate constipation. 02/03/16  Yes Hollice Gong, Mir Mohammed, MD  Cholecalciferol (VITAMIN D3) 50 MCG (2000 UT) TABS Take 2,000 Units by mouth daily.   Yes [provider]  docusate sodium (COLACE) 100 MG capsule Take 2 capsules (200 mg total) by mouth 2 (two) times daily as needed for mild constipation. 02/17/16  Yes Thurnell Lose, MD  escitalopram (LEXAPRO) 5 MG tablet Take 5 mg by mouth in the morning and at bedtime.  07/08/18  Yes [provider]  feeding supplement, ENSURE ENLIVE, (ENSURE ENLIVE) LIQD Take 237 mLs by mouth 2 (two) times daily between meals. Give 2 week supply 02/17/16  Yes Thurnell Lose, MD  gabapentin (NEURONTIN) 300 MG capsule Take 300 mg by mouth in the morning and at bedtime. 11/27/19  Yes [provider]  hydrALAZINE (APRESOLINE)  25 MG tablet Take 1 tablet (25 mg total) by mouth every 8 (eight) hours. 12/09/19 01/08/20 Yes Elodia Florence., MD  HYDROcodone-acetaminophen (NORCO/VICODIN) 5-325 MG tablet Take 1 tablet by mouth every 6 (six) hours as needed for moderate pain.   Yes [provider]  lidocaine (LIDODERM) 5 % Place 1 patch onto the skin daily. Remove & Discard patch within 12 hours or as directed by MD 12/10/19 01/09/20 Yes Elodia Florence., MD  losartan (COZAAR) 50 MG tablet Take 50 mg by mouth daily. 10/15/19  Yes [provider]  mirtazapine (REMERON) 15 MG tablet Take 15 mg by mouth at bedtime. 10/07/19  Yes [provider]  omeprazole (PRILOSEC) 20 MG capsule Take 1 capsule (20 mg total) by mouth daily. 08/04/14  Yes Swords, Darrick Penna, MD  simvastatin (ZOCOR) 20 MG tablet TAKE 1 TABLET EVERY DAY Patient taking differently: Take 20 mg by mouth every evening.  09/29/13  Yes Swords, Darrick Penna, MD  meloxicam (MOBIC) 7.5 MG tablet Take 7.5 mg by mouth 2 (two) times daily. 11/03/19   [provider]    Physical Exam: Constitutional: Moderately built and nourished. Vitals:   12/31/19 0330 12/31/19 0345 12/31/19 0405 12/31/19 0454  BP: (!) 100/57 (!) 134/97 (!) 160/95 133/60  Pulse: 60 62 69 68  Resp:    18  Temp:    98.5 F (36.9 C)  TempSrc:    Oral  SpO2: 97% 100% 99% 90%  Weight:      Height:       Eyes: Anicteric no pallor. ENMT: No discharge from the ears eyes nose or mouth. Neck: No mass felt.  No neck rigidity. Respiratory: No rhonchi or crepitations. Cardiovascular: S1-S2 heard. Abdomen: Soft nontender bowel sounds present. Musculoskeletal: No edema. Skin: No rash. Neurologic: Patient is mildly lethargic but answers questions appropriately moves all extremities. Psychiatric: Mildly lethargic.   Labs on Admission: I have personally reviewed following labs and imaging studies  CBC: Recent Labs  Lab 12/31/19 0120  WBC 7.5  NEUTROABS 4.5  HGB 11.5*    HCT 37.0  MCV 95.1  PLT 956   Basic Metabolic Panel: Recent Labs  Lab 12/31/19 0120  NA 134*  K 3.9  CL 96*  CO2 27  GLUCOSE 109*  BUN 23  CREATININE 0.97  CALCIUM 9.5   GFR: Estimated Creatinine Clearance: 33.8 mL/min (by C-G formula based on SCr of 0.97 mg/dL). Liver Function Tests: No results for input(s): AST, ALT, ALKPHOS, BILITOT, PROT, ALBUMIN in the last 168 hours. No results for input(s): LIPASE, AMYLASE in the last 168 hours. No results for input(s): AMMONIA in the last 168 hours. Coagulation  Profile: No results for input(s): INR, PROTIME in the last 168 hours. Cardiac Enzymes: No results for input(s): CKTOTAL, CKMB, CKMBINDEX, TROPONINI in the last 168 hours. BNP (last 3 results) No results for input(s): PROBNP in the last 8760 hours. HbA1C: No results for input(s): HGBA1C in the last 72 hours. CBG: No results for input(s): GLUCAP in the last 168 hours. Lipid Profile: No results for input(s): CHOL, HDL, LDLCALC, TRIG, CHOLHDL, LDLDIRECT in the last 72 hours. Thyroid Function Tests: No results for input(s): TSH, T4TOTAL, FREET4, T3FREE, THYROIDAB in the last 72 hours. Anemia Panel: No results for input(s): VITAMINB12, FOLATE, FERRITIN, TIBC, IRON, RETICCTPCT in the last 72 hours. Urine analysis:    Component Value Date/Time   COLORURINE YELLOW 02/14/2016 1844   APPEARANCEUR CLEAR 02/14/2016 1844   LABSPEC 1.013 02/14/2016 1844   PHURINE 5.5 02/14/2016 1844   GLUCOSEU NEGATIVE 02/14/2016 1844   HGBUR TRACE (A) 02/14/2016 1844   BILIRUBINUR NEGATIVE 02/14/2016 1844   BILIRUBINUR n 12/06/2010 0000   KETONESUR NEGATIVE 02/14/2016 1844   PROTEINUR NEGATIVE 02/14/2016 1844   UROBILINOGEN 0.2 12/19/2011 1104   NITRITE NEGATIVE 02/14/2016 1844   LEUKOCYTESUR NEGATIVE 02/14/2016 1844   Sepsis Labs: @LABRCNTIP (procalcitonin:4,lacticidven:4) ) Recent Results (from the past 240 hour(s))  SARS Coronavirus 2 by RT PCR (hospital order, performed in [place]  hospital lab) Nasopharyngeal Nasopharyngeal Swab     Status: None   Collection Time: 12/31/19  2:17 AM   Specimen: Nasopharyngeal Swab  Result Value Ref Range Status   SARS Coronavirus 2 NEGATIVE NEGATIVE Final    Comment: (NOTE) SARS-CoV-2 target nucleic acids are NOT DETECTED.  The SARS-CoV-2 RNA is generally detectable in upper and lower respiratory specimens during the acute phase of infection. The lowest concentration of SARS-CoV-2 viral copies this assay can detect is 250 copies / mL. A negative result does not preclude SARS-CoV-2 infection and should not be used as the sole basis for treatment or other patient management decisions.  A negative result may occur with improper specimen collection / handling, submission of specimen other than nasopharyngeal swab, presence of viral mutation(s) within the areas targeted by this assay, and inadequate number of viral copies (<250 copies / mL). A negative result must be combined with clinical observations, patient history, and epidemiological information.  Fact Sheet for Patients:   StrictlyIdeas.no  Fact Sheet for Healthcare Providers: BankingDealers.co.za  This test is not yet approved or  cleared by the [place] FDA and has been authorized for detection and/or diagnosis of SARS-CoV-2 by FDA under an Emergency Use Authorization (EUA).  This EUA will remain in effect (meaning this test can be used) for the duration of the COVID-19 declaration under Section 564(b)(1) of the Act, 21 U.S.C. section 360bbb-3(b)(1), unless the authorization is terminated or revoked sooner.  Performed at [facility], [address] [place], [place] [address]      Radiological Exams on Admission: No results found.   Assessment/Plan Principal Problem:   Intractable pain Active Problems:   Hypertension   Hyperlipidemia   Compression fracture of T12 vertebra (HCC)   Low back pain     1. Intractable low back pain with recent T12 compression fracture -patient's pain has recurred again.  Patient states she was doing fine until yesterday.  Not sure if patient is having a new fracture or the worsening of the previous one.  May discuss with neurosurgery in the morning.  Presently and holding of any pain really medication since patient is lethargic.  2. Hypertension presently I am keeping on as needed IV hydralazine until patient is more alert awake.  Once patient is more alert awake start home medications. 3. Hyperlipidemia on statins. 4. Anemia follow CBC.   DVT prophylaxis: SCDs for now avoiding pharmacological DVT prophylaxis in case patient needs procedure. Code Status: DNR. Family Communication: We will need to discuss with family. Disposition Plan: To be determined. Consults called: None. Admission status: Observation.   Rise Patience MD [facility] Pager [phone].  If 7PM-7AM, please contact night-coverage [email or web address] Password [facility]  12/31/2019, 5:58 AM

## 2019-12-31 NOTE — Plan of Care (Signed)
Talked to neurosurgery, Dr. Marcello Moores. He recommended thoracolumbar MRI. Per Dr. Marcello Moores, Dr. Trenton Gammon to see patient tomorrow. Okay with strict bedrest. TLSO brace when up.   MRI thoracic spine and lumbar spine ordered

## 2019-12-31 NOTE — Progress Notes (Signed)
PROGRESS NOTE  [name] ZOX:[id number] DOB: 04-22-1923   PCP: Harrison Mons, PA  Patient is from: Home  DOA: 12/31/2019 LOS: 0  Brief Narrative / Interim history: 86 year old female with history of HTN, HLD and recent T12 compression fracture after fall presenting with acute on chronic pain.  Reportedly, her pain gotten worse in the last 24 hours that prompted her to come to ED.   Of note, patient has had 4 prescriptions of Percocet (#90 on 6/9, #30 on 6/4, #28 on 5/24 and #18 on 5/21 on discharge from hospital).   In ED, she received 2 doses of IV Dilaudid 1 mg 30 minutes apart without adequate pain control which prompted admission for pain control.  On admission, patient was somewhat lethargic.   Subjective: Seen and examined earlier this morning.  Started asking for pain medication as soon as I walked in.  She reports pain in both calf and denies back pain.  She could only describe the pain as severe.  She seems to fall asleep quickly but wakes up asking  for pain medications.  She denies bowel or bladder issue.  Objective: Vitals:   12/31/19 0345 12/31/19 0405 12/31/19 0454 12/31/19 0824  BP: (!) 134/97 (!) 160/95 133/60 (!) 164/77  Pulse: 62 69 68 74  Resp:   18 18  Temp:   98.5 F (36.9 C) 98 F (36.7 C)  TempSrc:   Oral Oral  SpO2: 100% 99% 90% 98%  Weight:      Height:       No intake or output data in the 24 hours ending 12/31/19 1309 Filed Weights   12/31/19 0018  Weight: 70.3 kg    Examination:  GENERAL: No apparent distress.  Nontoxic. HEENT: MMM.  Vision and hearing grossly intact.  NECK: Supple.  No apparent JVD.  RESP:  No IWOB.  Fair aeration bilaterally. CVS:  RRR. Heart sounds normal.  ABD/GI/GU: BS+. Abd soft, NTND.  MSK/EXT:  Moves extremities. No apparent deformity. No edema.  SKIN: no apparent skin lesion or wound NEURO: Sleepy but wakes easily.  Oriented appropriately.  Motor 4+/5 in both lower extremities.  Patellar reflex symmetric.   Light sensation intact. PSYCH: Seems to be in distress from pain  Procedures:  None  Microbiology summarized: COVID-19 PCR negative.  Assessment & Plan: Acute on chronic back pain Bilateral lower extremity pain History of recent traumatic T12 compression fracture after fall -Patient reports severe bilateral calf pain and asks for pain medication.  No red flags on exam.  I am not really sure if her pain has really started in the last 24 hours as she seems to be filling Percocet prescription frequently as below.  However, we still need to rule out acute issues in her low back -Scheduled Tylenol with as needed oxycodone and Dilaudid -Resume home gabapentin -CT lumbar spine to exclude acute issues.  -Discontinue SCD and start subcu Lovenox -PT/OT after CT lumbar spine -Neurosurgery consult based on CT lumbar spine finding   Essential hypertension: SBP slightly elevated.  Could be due to pain -Resume home BP medications after med reconciliation. -Pain control as above -As needed hydralazine  Normocytic anemia: Stable -Check anemia panel and monitor.  Hyperlipidemia -Continue home statin  Goal of care/DNR/DNI-appropriate           DVT prophylaxis:  enoxaparin (LOVENOX) injection 40 mg Start: 12/31/19 1100  Code Status: DNR/DNI Family Communication: Attempted to call patient's daughter for update but no answer. Status is: Observation  The  patient remains OBS appropriate and will d/c before 2 midnights.  Dispo: The patient is from: Home              Anticipated d/c is to: To be determined              Anticipated d/c date is: 1 day              Patient currently is not medically stable to d/c.       Consultants:  None   Sch Meds:  Scheduled Meds: . acetaminophen  1,000 mg Oral Q8H  . enoxaparin (LOVENOX) injection  40 mg Subcutaneous Q24H  . gabapentin  300 mg Oral BID  . lidocaine  1 patch Transdermal Q24H   Continuous Infusions: PRN Meds:.hydrALAZINE,  HYDROmorphone (DILAUDID) injection, ondansetron **OR** ondansetron (ZOFRAN) IV, oxyCODONE  Antimicrobials: Anti-infectives (From admission, onward)   None       I have personally reviewed the following labs and images: CBC: Recent Labs  Lab 12/31/19 0120 12/31/19 0613  WBC 7.5 5.9  NEUTROABS 4.5 3.0  HGB 11.5* 9.9*  HCT 37.0 32.6*  MCV 95.1 96.4  PLT 277 264   BMP &GFR Recent Labs  Lab 12/31/19 0120 12/31/19 0613  NA 134* 136  K 3.9 3.9  CL 96* 100  CO2 27 28  GLUCOSE 109* 108*  BUN 23 21  CREATININE 0.97 1.04*  CALCIUM 9.5 9.2   Estimated Creatinine Clearance: 31.5 mL/min (A) (by C-G formula based on SCr of 1.04 mg/dL (H)). Liver & Pancreas: Recent Labs  Lab 12/31/19 0613  AST 18  ALT 14  ALKPHOS 97  BILITOT 0.4  PROT 5.8*  ALBUMIN 3.0*   No results for input(s): LIPASE, AMYLASE in the last 168 hours. No results for input(s): AMMONIA in the last 168 hours. Diabetic: No results for input(s): HGBA1C in the last 72 hours. No results for input(s): GLUCAP in the last 168 hours. Cardiac Enzymes: No results for input(s): CKTOTAL, CKMB, CKMBINDEX, TROPONINI in the last 168 hours. No results for input(s): PROBNP in the last 8760 hours. Coagulation Profile: No results for input(s): INR, PROTIME in the last 168 hours. Thyroid Function Tests: No results for input(s): TSH, T4TOTAL, FREET4, T3FREE, THYROIDAB in the last 72 hours. Lipid Profile: No results for input(s): CHOL, HDL, LDLCALC, TRIG, CHOLHDL, LDLDIRECT in the last 72 hours. Anemia Panel: No results for input(s): VITAMINB12, FOLATE, FERRITIN, TIBC, IRON, RETICCTPCT in the last 72 hours. Urine analysis:    Component Value Date/Time   COLORURINE YELLOW 02/14/2016 1844   APPEARANCEUR CLEAR 02/14/2016 1844   LABSPEC 1.013 02/14/2016 1844   PHURINE 5.5 02/14/2016 1844   GLUCOSEU NEGATIVE 02/14/2016 1844   HGBUR TRACE (A) 02/14/2016 1844   BILIRUBINUR NEGATIVE 02/14/2016 1844   BILIRUBINUR n 12/06/2010  0000   KETONESUR NEGATIVE 02/14/2016 1844   PROTEINUR NEGATIVE 02/14/2016 1844   UROBILINOGEN 0.2 12/19/2011 1104   NITRITE NEGATIVE 02/14/2016 1844   LEUKOCYTESUR NEGATIVE 02/14/2016 1844   Sepsis Labs: Invalid input(s): PROCALCITONIN, [place]  Microbiology: Recent Results (from the past 240 hour(s))  SARS Coronavirus 2 by RT PCR (hospital order, performed in [facility] hospital lab) Nasopharyngeal Nasopharyngeal Swab     Status: None   Collection Time: 12/31/19  2:17 AM   Specimen: Nasopharyngeal Swab  Result Value Ref Range Status   SARS Coronavirus 2 NEGATIVE NEGATIVE Final    Comment: (NOTE) SARS-CoV-2 target nucleic acids are NOT DETECTED.  The SARS-CoV-2 RNA is generally detectable in upper and lower  respiratory specimens during the acute phase of infection. The lowest concentration of SARS-CoV-2 viral copies this assay can detect is 250 copies / mL. A negative result does not preclude SARS-CoV-2 infection and should not be used as the sole basis for treatment or other patient management decisions.  A negative result may occur with improper specimen collection / handling, submission of specimen other than nasopharyngeal swab, presence of viral mutation(s) within the areas targeted by this assay, and inadequate number of viral copies (<250 copies / mL). A negative result must be combined with clinical observations, patient history, and epidemiological information.  Fact Sheet for Patients:   StrictlyIdeas.no  Fact Sheet for Healthcare Providers: BankingDealers.co.za  This test is not yet approved or  cleared by the [place] FDA and has been authorized for detection and/or diagnosis of SARS-CoV-2 by FDA under an Emergency Use Authorization (EUA).  This EUA will remain in effect (meaning this test can be used) for the duration of the COVID-19 declaration under Section 564(b)(1) of the Act, 21 U.S.C. section  360bbb-3(b)(1), unless the authorization is terminated or revoked sooner.  Performed at [facility], [address] [place], [place] [address]     Radiology Studies: No results found.    Delva Derden T. Rhame  If 7PM-7AM, please contact night-coverage [email or web address] Password [facility] 12/31/2019, 1:09 PM

## 2019-12-31 NOTE — ED Notes (Signed)
Report given to: Venia Minks, RN at [facility].

## 2019-12-31 NOTE — ED Triage Notes (Signed)
Brought by ems from home.  Was here previously diagnosed with T12 compression fracture.  Has been home from rehab 2 days.  Reports being able to walk at home but reports worsening bil leg pain.

## 2019-12-31 NOTE — Progress Notes (Signed)
Patient medicated as ordered for pain, patient was upset she was not given dilaudid and stated she has tried the roxicodone one time before and it didn't work.  Dr. Cyndia Skeeters notified and paged.  Awaiting call back.

## 2019-12-31 NOTE — Plan of Care (Signed)

## 2019-12-31 NOTE — ED Notes (Signed)
This RN advised patient's daughter Sangeeta Youse that patient will be transferred to [facility].

## 2019-12-31 NOTE — ED Provider Notes (Signed)
[place] EMERGENCY DEPARTMENT Provider Note   CSN: [id number] Arrival date & time: 12/31/19  0021     History Chief Complaint  Patient presents with  . Leg Pain    [name] is a 86 y.o. female.  Patient is a 86 year old female with past medical history of hypertension, hyperlipidemia, osteoporosis, and recent admission for T12 compression fracture.  Patient fell several weeks ago and was diagnosed with this.  She was admitted at [facility] for several days for pain control, then was sent to [facility] home for rehab.  Patient has been home for several days during which time her pain has worsened and is unrelieved with Percocet.  She is unable to move, walk, or sit up without severe pain.  EMS was called and the patient was transported here.  She describes the pain as radiating into her legs.  She denies any weakness or numbness.  She denies any bowel or bladder complaints.  She denies any new fall or trauma.  The history is provided by the patient.       Past Medical History:  Diagnosis Date  . Allergy   . Arthritis   . Depression   . Endometriosis   . GERD (gastroesophageal reflux disease)   . Hearing loss 12/08/2011   bil, hearing aids  . Hyperlipidemia   . Hypertension   . Osteoporosis   . Ovarian cyst   . Polymyalgia rheumatica ([place])   . Post herpetic neuralgia   . S/P right THA, AA 12/16/2011  . Shingles 2020  . Skin cancer   . Spinal stenosis     Patient Active Problem List   Diagnosis Date Noted  . Compression fracture of T12 vertebra ([place]) 12/04/2019  . Spinal fracture of T12 vertebra ([place]) 12/04/2019  . Fever, unspecified 02/15/2016  . Anxiety 02/15/2016  . Anemia 02/15/2016  . Hyponatremia 02/15/2016  . Bradycardia   . Constipation   . Depression   . MDD (major depressive disorder), recurrent severe, without psychosis ([place])   . Symptomatic bradycardia 01/30/2016  . Symptomatic anemia 01/29/2016  . Hypertension     . Osteoporosis   . Hyperlipidemia   . GERD (gastroesophageal reflux disease)   . Spinal stenosis   . Hearing loss 12/08/2011  . Osteoarthritis of hip 12/02/2011  . Endometriosis   . DIVERTICULOSIS-COLON 02/12/2009  . ALLERGIC RHINITIS 01/19/2007  . ESOPHAGEAL STRICTURE 01/19/2007    Past Surgical History:  Procedure Laterality Date  . CATARACT EXTRACTION, BILATERAL  12-08-11   bil  . DILATION AND CURETTAGE OF UTERUS    . HERNIA REPAIR    . HYSTEROSCOPY    . OOPHORECTOMY     bilateral  . STERIOD INJECTION  12/09/11   to spine for rt calf pain and spasms  . TIBIA FRACTURE SURGERY     trauma  . TONSILLECTOMY AND ADENOIDECTOMY    . TOTAL HIP ARTHROPLASTY  12/16/2011   Procedure: TOTAL HIP ARTHROPLASTY ANTERIOR APPROACH;  Surgeon: Mauri Pole, MD;  Location: [facility] ORS;  Service: Orthopedics;  Laterality: Right;     OB History    Gravida  4   Para  4   Term  4   Preterm      AB      Living  2     SAB      TAB      Ectopic      Multiple      Live Births  Family History  Problem Relation Age of Onset  . Stroke Sister   . Diabetes Daughter   . Cancer Father        esophogeal  . Cancer Paternal Aunt        stomach  . Breast cancer Maternal Grandmother        Age 49  . Stroke Mother     Social History   Tobacco Use  . Smoking status: Former Research scientist (life sciences)  . Smokeless tobacco: Never Used  Substance Use Topics  . Alcohol use: No    Alcohol/week: 1.0 standard drink    Types: 1 Standard drinks or equivalent per week  . Drug use: No    Home Medications Prior to Admission medications   Medication Sig Start Date End Date Taking? Authorizing Provider  acetaminophen (TYLENOL) 325 MG tablet Take 650 mg by mouth every 4 (four) hours as needed for mild pain, moderate pain, fever or headache.    Yes [provider]  bisacodyl (DULCOLAX) 5 MG EC tablet Take 1 tablet (5 mg total) by mouth daily as needed for moderate constipation. 02/03/16   Yes Hollice Gong, Mir Mohammed, MD  Cholecalciferol (VITAMIN D3) 50 MCG (2000 UT) TABS Take 2,000 Units by mouth daily.   Yes [provider]  docusate sodium (COLACE) 100 MG capsule Take 2 capsules (200 mg total) by mouth 2 (two) times daily as needed for mild constipation. 02/17/16  Yes Thurnell Lose, MD  escitalopram (LEXAPRO) 5 MG tablet Take 5 mg by mouth in the morning and at bedtime.  07/08/18  Yes [provider]  feeding supplement, ENSURE ENLIVE, (ENSURE ENLIVE) LIQD Take 237 mLs by mouth 2 (two) times daily between meals. Give 2 week supply 02/17/16  Yes Thurnell Lose, MD  gabapentin (NEURONTIN) 300 MG capsule Take 300 mg by mouth in the morning and at bedtime. 11/27/19  Yes [provider]  hydrALAZINE (APRESOLINE) 25 MG tablet Take 1 tablet (25 mg total) by mouth every 8 (eight) hours. 12/09/19 01/08/20 Yes Elodia Florence., MD  HYDROcodone-acetaminophen (NORCO/VICODIN) 5-325 MG tablet Take 1 tablet by mouth every 6 (six) hours as needed for moderate pain.   Yes [provider]  lidocaine (LIDODERM) 5 % Place 1 patch onto the skin daily. Remove & Discard patch within 12 hours or as directed by MD 12/10/19 01/09/20 Yes Elodia Florence., MD  losartan (COZAAR) 50 MG tablet Take 50 mg by mouth daily. 10/15/19  Yes [provider]  mirtazapine (REMERON) 15 MG tablet Take 15 mg by mouth at bedtime. 10/07/19  Yes [provider]  omeprazole (PRILOSEC) 20 MG capsule Take 1 capsule (20 mg total) by mouth daily. 08/04/14  Yes Swords, Darrick Penna, MD  simvastatin (ZOCOR) 20 MG tablet TAKE 1 TABLET EVERY DAY Patient taking differently: Take 20 mg by mouth every evening.  09/29/13  Yes Swords, Darrick Penna, MD  meloxicam (MOBIC) 7.5 MG tablet Take 7.5 mg by mouth 2 (two) times daily. 11/03/19   [provider]    Allergies    Cartia xt [diltiazem], Codeine, Lisinopril-hydrochlorothiazide, Mercury, Sulfa antibiotics, and Sulfonamide  derivatives  Review of Systems   Review of Systems  All other systems reviewed and are negative.   Physical Exam Updated Vital Signs BP (!) 162/88   Pulse 68   Temp 97.7 F (36.5 C) (Oral)   Resp 20   Ht 5' 3.5" (1.613 m)   Wt 70.3 kg   SpO2 94%   BMI  27.03 kg/m   Physical Exam Vitals and nursing note reviewed.  Constitutional:      General: She is not in acute distress.    Appearance: She is well-developed. She is not diaphoretic.  HENT:     Head: Normocephalic and atraumatic.  Cardiovascular:     Rate and Rhythm: Normal rate and regular rhythm.     Heart sounds: No murmur heard.  No friction rub. No gallop.   Pulmonary:     Effort: Pulmonary effort is normal. No respiratory distress.     Breath sounds: Normal breath sounds. No wheezing.  Abdominal:     General: Bowel sounds are normal. There is no distension.     Palpations: Abdomen is soft.     Tenderness: There is no abdominal tenderness.  Musculoskeletal:        General: Normal range of motion.     Cervical back: Normal range of motion and neck supple.  Skin:    General: Skin is warm and dry.  Neurological:     Mental Status: She is alert and oriented to person, place, and time.     Comments: Lower extremities are grossly normal in appearance.  Sensation is intact throughout both legs and feet.  DP pulses are palpable.  Strength is 5 out of 5 in both lower extremities     ED Results / Procedures / Treatments   Labs (all labs ordered are listed, but only abnormal results are displayed) Labs Reviewed  BASIC METABOLIC PANEL  CBC WITH DIFFERENTIAL/PLATELET    EKG None  Radiology No results found.  Procedures Procedures (including critical care time)  Medications Ordered in ED Medications  HYDROmorphone (DILAUDID) injection 1 mg (has no administration in time range)    ED Course  I have reviewed the triage vital signs and the nursing notes.  Pertinent labs & imaging results that were available  during my care of the patient were reviewed by me and considered in my medical decision making (see chart for details).    MDM Rules/Calculators/A&P  Patient with recent admission for T12 compression fracture.  She has been home from rehab for the past 3 days, however her home pain medication is not helping.  She comes in tonight by EMS with complaints of severe back pain that is unrelieved with her Percocet.  There are no neurologic deficits and I do not feel as though repeat imaging is indicated as there has been no new trauma or fall.  Patient given separate doses of Dilaudid, however is not feeling better.  I do not feel as no she can safely return home.  I have spoken with Dr. Myna Hidalgo who agrees to accept the patient for admission.  Final Clinical Impression(s) / ED Diagnoses Final diagnoses:  None    Rx / DC Orders ED Discharge Orders    None       Veryl Speak, MD 12/31/19 [phone]

## 2019-12-31 NOTE — Consult Note (Signed)
Palliative Medicine Inpatient Consult Note  Reason for consult:  Goals of Care "Goal of care discussion. Here is with progressive T12 fracture"  HPI:  Per intake H&P --> [name] is a 86 y.o. female with history of hypertension hyperlipidemia was recently admitted for T12 compression fracture for which neurosurgery recommended conservative management with TLSO brace was eventually discharged to rehab.  Patient states he was doing well until 24 hours ago when patient's pain worsened suddenly.  Pain is radiating to both of the lower extremities.  Denies any incontinence of urine or bowel.  Palliative care was asked to help aid in goals of care conversations.   Clinical Assessment/Goals of Care: I have reviewed medical records including EPIC notes, labs and imaging, received report from bedside RN, assessed the patient. [name] was in bed noted to be in tremendous pain upon assessment. It appears that pain is intermittently and most likely related to spasms.    I met with [name] to further discuss diagnosis prognosis, GOC, EOL wishes, disposition and options.   I introduced Palliative Medicine as specialized medical care for people living with serious illness. It focuses on providing relief from the symptoms and stress of a serious illness. The goal is to improve quality of life for both the patient and the family.  I asked [name] to tell me about herself. She shares that she is from [facility] [place] [facility] [place] originally. 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 She is a widow and was married to her husband for over 51 years. He died three years ago on hospice. [name] shares that they shares two daughters and two sons together though she lost one of her daughters and sons. She states that her daughter, [name] lives locally and her son, [name] is located in  [place] though they are well involved in her life. She enjoys knitting and sewing. 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 She vocalizes loving to bake and cooking food for the less fortunate. She is member of a Environmental manager.   Prior to [facility] initial fracture she states that she was well functioning and able to perform all of her bADLs independently. She shares that she has help 3x weekly and her daughter, [name] checks in with her daily.   I asked [name] to give me an impression as to how her health has been. She stated that she was doing well after her initial T12 fracture she had gone to [facility] and was rehabbing well. She had just gotten discharged and reported worsening pain leading to her ER visit.   A detailed discussion was had today regarding advanced directives, [name] states that she has these at home and will ask her daughter, [name] to provide a copy.   Concepts specific to code status, artifical feeding and hydration, continued IV antibiotics and rehospitalization was had.  I completed a MOST form today. The patient and family outlined their wishes for the following treatment decisions:  Cardiopulmonary Resuscitation: Do Not Attempt Resuscitation (DNR/No CPR)  Medical Interventions: Limited Additional Interventions: Use medical treatment, IV fluids and cardiac monitoring as indicated, DO NOT USE intubation or mechanical ventilation. May consider use of less invasive airway support such as BiPAP or CPAP. Also provide comfort measures. Transfer to the hospital if indicated. Avoid intensive care.   Antibiotics:  Determine use of limitation of antibiotics when infection occurs  IV Fluids: IV fluids for a defined trial period  Feeding Tube: No feeding tube   The difference between a aggressive medical intervention path  and a palliative comfort care path for this patient at this time was had. [name] is very realistic and has been through  hospice with her husband for his heart failure. She hopes to have many more good days ahead of her. We discussed that if ever her condition deteriorates to the point whereby she lacks in making meaningful improvement and has a worsening prognosis that she would be open to conversations on hospice. She states, "I am not there yet."  Values and goals of care important to patient and family were attempted to be elicited. It is important to [name] that she be able to stay active. Her goals include continuing with efforts to help those in need. She requested to know what the next steps in her care will be. We discussed that she will undergo an MRI and then the neurosurgery team can identify if there are any surgical options.   Discussed the importance of continued conversation with family and their  medical providers regarding overall plan of care and treatment options, ensuring decisions are within the context of the patients values and GOCs.  Decision Maker: [name] (Daughter) [phone]  SUMMARY OF RECOMMENDATIONS   DNAR/DNI  MOST completed and placed on chart  Bladder scan to check for urinary retention  Will order laxatives in the setting of ongoing opioid use  Added flexeril TID PRN for muscle spasms the benefits at this point outweigh the risks   Continue PMT support  Code Status/Advance Care Planning: DNAR/DNI   Palliative Prophylaxis:   Constipation, Urinary Retention, Delirium, Oral Care  Additional Recommendations (Limitations, Scope, Preferences):  Treat what is treatable   Psycho-social/Spiritual:   Desire for further Chaplaincy support: Yes  Additional Recommendations: Education on Palliative care   Prognosis: Unclear  Discharge Planning: Will depend on patient progression  PPS: 20%    This conversation/these recommendations were discussed with patient primary care team, Dr. Cyndia Skeeters  Time In: 1530 Time Out: 1645 Total Time: 75 Greater than 50%  of this  time was spent counseling and coordinating care related to the above assessment and plan.  [place] Team Team Cell Phone: [phone] Please utilize secure chat with additional questions, if there is no response within 30 minutes please call the above phone number  Palliative Medicine Team providers are available by phone from 7am to 7pm daily and can be reached through the team cell phone.  Should this patient require assistance outside of these hours, please call the patient's attending physician.

## 2019-12-31 NOTE — ED Notes (Signed)
ED Provider at bedside. 

## 2019-12-31 NOTE — Progress Notes (Signed)
Daughter called, updated.

## 2019-12-31 NOTE — ED Notes (Signed)
Ice water provided.

## 2020-01-01 DIAGNOSIS — Z87891 Personal history of nicotine dependence: Secondary | ICD-10-CM | POA: Diagnosis not present

## 2020-01-01 DIAGNOSIS — Z96641 Presence of right artificial hip joint: Secondary | ICD-10-CM | POA: Diagnosis present

## 2020-01-01 DIAGNOSIS — S22080A Wedge compression fracture of T11-T12 vertebra, initial encounter for closed fracture: Secondary | ICD-10-CM | POA: Diagnosis present

## 2020-01-01 DIAGNOSIS — M5441 Lumbago with sciatica, right side: Secondary | ICD-10-CM

## 2020-01-01 DIAGNOSIS — G8929 Other chronic pain: Secondary | ICD-10-CM

## 2020-01-01 DIAGNOSIS — Z20822 Contact with and (suspected) exposure to covid-19: Secondary | ICD-10-CM | POA: Diagnosis present

## 2020-01-01 DIAGNOSIS — Z791 Long term (current) use of non-steroidal anti-inflammatories (NSAID): Secondary | ICD-10-CM | POA: Diagnosis not present

## 2020-01-01 DIAGNOSIS — M4804 Spinal stenosis, thoracic region: Secondary | ICD-10-CM | POA: Diagnosis present

## 2020-01-01 DIAGNOSIS — Z833 Family history of diabetes mellitus: Secondary | ICD-10-CM | POA: Diagnosis not present

## 2020-01-01 DIAGNOSIS — M79604 Pain in right leg: Secondary | ICD-10-CM

## 2020-01-01 DIAGNOSIS — M8728 Osteonecrosis due to previous trauma, other site: Secondary | ICD-10-CM | POA: Diagnosis present

## 2020-01-01 DIAGNOSIS — S22088A Other fracture of T11-T12 vertebra, initial encounter for closed fracture: Secondary | ICD-10-CM | POA: Diagnosis present

## 2020-01-01 DIAGNOSIS — E785 Hyperlipidemia, unspecified: Secondary | ICD-10-CM | POA: Diagnosis present

## 2020-01-01 DIAGNOSIS — R52 Pain, unspecified: Secondary | ICD-10-CM | POA: Diagnosis present

## 2020-01-01 DIAGNOSIS — M5124 Other intervertebral disc displacement, thoracic region: Secondary | ICD-10-CM | POA: Diagnosis present

## 2020-01-01 DIAGNOSIS — K5900 Constipation, unspecified: Secondary | ICD-10-CM | POA: Diagnosis present

## 2020-01-01 DIAGNOSIS — I959 Hypotension, unspecified: Secondary | ICD-10-CM | POA: Diagnosis not present

## 2020-01-01 DIAGNOSIS — Z66 Do not resuscitate: Secondary | ICD-10-CM

## 2020-01-01 DIAGNOSIS — Z823 Family history of stroke: Secondary | ICD-10-CM | POA: Diagnosis not present

## 2020-01-01 DIAGNOSIS — Z7189 Other specified counseling: Secondary | ICD-10-CM | POA: Diagnosis not present

## 2020-01-01 DIAGNOSIS — M5134 Other intervertebral disc degeneration, thoracic region: Secondary | ICD-10-CM

## 2020-01-01 DIAGNOSIS — W19XXXA Unspecified fall, initial encounter: Secondary | ICD-10-CM | POA: Diagnosis present

## 2020-01-01 DIAGNOSIS — Z888 Allergy status to other drugs, medicaments and biological substances status: Secondary | ICD-10-CM | POA: Diagnosis not present

## 2020-01-01 DIAGNOSIS — D649 Anemia, unspecified: Secondary | ICD-10-CM | POA: Diagnosis present

## 2020-01-01 DIAGNOSIS — S22080G Wedge compression fracture of T11-T12 vertebra, subsequent encounter for fracture with delayed healing: Secondary | ICD-10-CM

## 2020-01-01 DIAGNOSIS — E871 Hypo-osmolality and hyponatremia: Secondary | ICD-10-CM | POA: Diagnosis not present

## 2020-01-01 DIAGNOSIS — M5442 Lumbago with sciatica, left side: Secondary | ICD-10-CM

## 2020-01-01 DIAGNOSIS — Z515 Encounter for palliative care: Secondary | ICD-10-CM | POA: Diagnosis not present

## 2020-01-01 DIAGNOSIS — M810 Age-related osteoporosis without current pathological fracture: Secondary | ICD-10-CM | POA: Diagnosis present

## 2020-01-01 DIAGNOSIS — M79605 Pain in left leg: Secondary | ICD-10-CM

## 2020-01-01 DIAGNOSIS — Z79899 Other long term (current) drug therapy: Secondary | ICD-10-CM | POA: Diagnosis not present

## 2020-01-01 DIAGNOSIS — I10 Essential (primary) hypertension: Secondary | ICD-10-CM | POA: Diagnosis present

## 2020-01-01 LAB — RENAL FUNCTION PANEL
Albumin: 2.9 g/dL — ABNORMAL LOW (ref 3.5–5.0)
Anion gap: 7 (ref 5–15)
BUN: 17 mg/dL (ref 8–23)
CO2: 28 mmol/L (ref 22–32)
Calcium: 9 mg/dL (ref 8.9–10.3)
Chloride: 102 mmol/L (ref 98–111)
Creatinine, Ser: 0.82 mg/dL (ref 0.44–1.00)
GFR calc Af Amer: >60 mL/min (ref >60)
GFR calc non Af Amer: >60 mL/min (ref >60)
Glucose, Bld: 108 mg/dL — ABNORMAL HIGH (ref 70–99)
Phosphorus: 4 mg/dL (ref 2.5–4.6)
Potassium: 4.3 mmol/L (ref 3.5–5.1)
Sodium: 137 mmol/L (ref 135–145)

## 2020-01-01 LAB — CBC
HCT: 32.3 % — ABNORMAL LOW (ref 36.0–46.0)
Hemoglobin: 9.9 g/dL — ABNORMAL LOW (ref 12.0–15.0)
MCH: 29.6 pg (ref 26.0–34.0)
MCHC: 30.7 g/dL (ref 30.0–36.0)
MCV: 96.7 fL (ref 80.0–100.0)
Platelets: 221 10*3/uL (ref 150–400)
RBC: 3.34 MIL/uL — ABNORMAL LOW (ref 3.87–5.11)
RDW: 13.8 % (ref 11.5–15.5)
WBC: 6.4 10*3/uL (ref 4.0–10.5)
nRBC: 0 % (ref 0.0–0.2)

## 2020-01-01 LAB — MAGNESIUM: Magnesium: 1.5 mg/dL — ABNORMAL LOW (ref 1.7–2.4)

## 2020-01-01 LAB — GLUCOSE, CAPILLARY
Glucose-Capillary: 111 mg/dL — ABNORMAL HIGH (ref 70–99)
Glucose-Capillary: 111 mg/dL — ABNORMAL HIGH (ref 70–99)
Glucose-Capillary: 116 mg/dL — ABNORMAL HIGH (ref 70–99)
Glucose-Capillary: 155 mg/dL — ABNORMAL HIGH (ref 70–99)
Glucose-Capillary: 172 mg/dL — ABNORMAL HIGH (ref 70–99)

## 2020-01-01 MED ORDER — HYDROMORPHONE HCL 1 MG/ML IJ SOLN
1.0000 mg | INTRAMUSCULAR | Status: DC | PRN
  Administered 2020-01-01 – 2020-01-02 (×2): 1 mg via INTRAVENOUS
  Filled 2020-01-01 (×2): qty 1

## 2020-01-01 MED ORDER — GABAPENTIN 300 MG PO CAPS
600.0000 mg | ORAL_CAPSULE | Freq: Every day | ORAL | Status: DC
  Administered 2020-01-01 – 2020-01-05 (×5): 600 mg via ORAL
  Filled 2020-01-01 (×5): qty 2

## 2020-01-01 MED ORDER — DEXAMETHASONE SODIUM PHOSPHATE 10 MG/ML IJ SOLN
6.0000 mg | Freq: Once | INTRAMUSCULAR | Status: AC
  Administered 2020-01-01: 6 mg via INTRAVENOUS
  Filled 2020-01-01: qty 1

## 2020-01-01 MED ORDER — GABAPENTIN 300 MG PO CAPS
300.0000 mg | ORAL_CAPSULE | Freq: Two times a day (BID) | ORAL | Status: DC
  Administered 2020-01-02 – 2020-01-06 (×10): 300 mg via ORAL
  Filled 2020-01-01 (×10): qty 1

## 2020-01-01 MED ORDER — MAGNESIUM SULFATE 2 GM/50ML IV SOLN
2.0000 g | Freq: Once | INTRAVENOUS | Status: AC
  Administered 2020-01-01: 2 g via INTRAVENOUS
  Filled 2020-01-01: qty 50

## 2020-01-01 MED ORDER — DEXAMETHASONE SODIUM PHOSPHATE 4 MG/ML IJ SOLN
4.0000 mg | Freq: Three times a day (TID) | INTRAMUSCULAR | Status: DC
  Administered 2020-01-01 – 2020-01-04 (×7): 4 mg via INTRAVENOUS
  Filled 2020-01-01 (×7): qty 1

## 2020-01-01 NOTE — Plan of Care (Signed)

## 2020-01-01 NOTE — Progress Notes (Signed)
PROGRESS NOTE  [name] NFA:[id number] DOB: 1922-07-23   PCP: Harrison Mons, PA  Patient is from: Home  DOA: 12/31/2019 LOS: 0  Brief Narrative / Interim history: 86 year old female with history of HTN, HLD and recent T12 compression fracture after fall presenting with acute on chronic pain.  Reportedly, her pain gotten worse in the last 24 hours that prompted her to come to ED.   Of note, patient has had 4 prescriptions of Percocet (#90 on 6/9, #30 on 6/4, #28 on 5/24 and #18 on 5/21 on discharge from hospital).   In ED, she received 2 doses of IV Dilaudid 1 mg 30 minutes apart without adequate pain control which prompted admission for pain control.  On admission, patient was somewhat lethargic.   The next day, patient continued to endorse significant bilateral calf pain without back pain.  CT lumbar spine ordered and revealed known comminuted T12 body fracture with progressive height loss, retropulsion and related advanced spinal stenosis without signs of interval healing.  Neurosurgery, Dr. Marcello Moores consulted and recommended MRI of thoracic spine and lumbar spine, and Dr. [name] to see patient the next day.  MRI obtained later in the day showed severe T12 compression fracture with osteonecrosis and retropulsion resulting in moderate mass-effect on the conus medullaris without conus signal abnormality, mild marrow edema at the inferior T11 body without evidence of impending collapse and superimposed degenerative thoracic disc herniation at T6-T7 resulting in mild spinal stenosis and cord mass-effect without cord signal abnormality.   Received a secure chart from Dr. [name] on 6/13, and he suggested the case should go through the on call Neurosurgeon. Dr. Marcello Moores evaluated patient and recommended increasing gabapentin, starting Decadron and keeping NPO after midnight.    Subjective: Seen and examined earlier this morning.  No major events overnight or this morning.  Continues to complain  significant bilateral calf pain that she describes as cramping.  She thinks her pain has improved but still significant.  She rates her pain 8/10.  Pain seems to be intermittent.  She denies bowel or bladder issue.  She denies chest pain or dyspnea.  She is very eager to hear from the neurosurgery about the plan.  Talked to patient's daughter over the phone from patient's phone at bedside.  Objective: Vitals:   12/31/19 2100 01/01/20 0000 01/01/20 0318 01/01/20 0803  BP:  118/64 111/60 (!) 171/79  Pulse: 84 74 64 66  Resp:  20 14 14   Temp: 98.1 F (36.7 C) 99.3 F (37.4 C) 98.5 F (36.9 C) 98.2 F (36.8 C)  TempSrc: Oral Oral Oral Oral  SpO2: 95% 95% 96% 95%  Weight:      Height:        Intake/Output Summary (Last 24 hours) at 01/01/2020 1427 Last data filed at 01/01/2020 1247 Gross per 24 hour  Intake 502.13 ml  Output 550 ml  Net -47.87 ml   Filed Weights   12/31/19 0018  Weight: 70.3 kg    Examination:  GENERAL: No apparent distress.  Nontoxic. HEENT: MMM.  Vision and hearing grossly intact.  NECK: Supple.  No apparent JVD.  RESP:  No IWOB.  Fair aeration bilaterally. CVS:  RRR. Heart sounds normal.  ABD/GI/GU: BS+. Abd soft, NTND.  MSK/EXT:  Moves extremities. No apparent deformity. No edema.  Negative Hoffmann signs SKIN: no apparent skin lesion or wound NEURO: Awake, alert and oriented appropriately.  No apparent focal neuro deficit.  Motor 4+/5 in both LE.  Patellar reflex symmetric. PSYCH: Seems  to be in distress from episodic leg pain  Procedures:  None  Microbiology summarized: COVID-19 PCR negative.  Assessment & Plan: Progressive T12 compression fracture with retropulsion and spinal stenosis Degenerative thoracic disc herniation at T6-T7 resulting in mild spinal stenosis and cord mass-effect Bilateral lower extremity pain-suspect claudication due to the above. Chronic back pain-likely due to the above -No cauda equina signs.  About 4+/5 BLE strength on  exam. Doubt DVT in BLE. Hoffmann signs negative.  -Increase Dilaudid to 1 mg every 4 hours as needed for severe pain-patient might have opiate tolerance -Percocet 5 mg every 6 hours as needed for moderate pain -Continue scheduled Tylenol 1 g every 8 hours and Flexeril 5 mg every 8 hours as needed -Increased gabapentin to 300 mg after breakfast and lunch, and 600 mg at night -Restarted IV Decadron 6 mg once followed by 4 mg every 8 hours as recommended by Dr. Marcello Moores -Continue SCD for VTE prophylaxis -N.p.o. after midnight -Per Dr. Marcello Moores, Dr. [name] to see patient on 6/14  Essential hypertension: Normotensive for most part. -Continue home medications. -Pain control as above -As needed hydralazine  Normocytic anemia: Stable -Check anemia panel and monitor.  Hypomagnesemia -Replenish and recheck.  Hyperlipidemia -Continue home statin  Goal of care/DNR/DNI-appropriate -Appreciate palliative care input.           DVT prophylaxis:  Place and maintain sequential compression device Start: 12/31/19 1444  Code Status: DNR/DNI Family Communication: Attempted to call patient's daughter for update but no answer. Status is: Observation.  Will change to inpatient.  The patient will require care spanning > 2 midnights and should be moved to inpatient because: Ongoing active pain requiring inpatient pain management and possible surgical intervention for hip progressive vertebral compression fracture  Dispo: The patient is from: Home              Anticipated d/c is to: To be determined              Anticipated d/c date is: > 3 days              Patient currently is not medically stable to d/c.       Consultants:  Neurosurgery   Sch Meds:  Scheduled Meds: . acetaminophen  1,000 mg Oral Q8H  . dexamethasone (DECADRON) injection  6 mg Intravenous Once   Followed by  . dexamethasone (DECADRON) injection  4 mg Intravenous Q8H  . [START ON 01/02/2020] gabapentin  300 mg Oral BID WC     And  . gabapentin  600 mg Oral QHS  . lidocaine  2 patch Transdermal Q24H  . senna-docusate  2 tablet Oral BID   Continuous Infusions: . magnesium sulfate bolus IVPB     PRN Meds:.cyclobenzaprine, hydrALAZINE, HYDROmorphone (DILAUDID) injection, ondansetron **OR** ondansetron (ZOFRAN) IV, oxyCODONE, polyethylene glycol  Antimicrobials: Anti-infectives (From admission, onward)   None       I have personally reviewed the following labs and images: CBC: Recent Labs  Lab 12/31/19 0120 12/31/19 0613 01/01/20 0929  WBC 7.5 5.9 6.4  NEUTROABS 4.5 3.0  --   HGB 11.5* 9.9* 9.9*  HCT 37.0 32.6* 32.3*  MCV 95.1 96.4 96.7  PLT 277 264 221   BMP &GFR Recent Labs  Lab 12/31/19 0120 12/31/19 0613 01/01/20 0929  NA 134* 136 137  K 3.9 3.9 4.3  CL 96* 100 102  CO2 27 28 28   GLUCOSE 109* 108* 108*  BUN 23 21 17   CREATININE 0.97 1.04* 0.82  CALCIUM 9.5 9.2 9.0  MG  --   --  1.5*  PHOS  --   --  4.0   Estimated Creatinine Clearance: 39.9 mL/min (by C-G formula based on SCr of 0.82 mg/dL). Liver & Pancreas: Recent Labs  Lab 12/31/19 0613 01/01/20 0929  AST 18  --   ALT 14  --   ALKPHOS 97  --   BILITOT 0.4  --   PROT 5.8*  --   ALBUMIN 3.0* 2.9*   No results for input(s): LIPASE, AMYLASE in the last 168 hours. No results for input(s): AMMONIA in the last 168 hours. Diabetic: No results for input(s): HGBA1C in the last 72 hours. Recent Labs  Lab 12/31/19 2017 12/31/19 2358 01/01/20 0657 01/01/20 1150  GLUCAP 103* 116* 111* 111*   Cardiac Enzymes: No results for input(s): CKTOTAL, CKMB, CKMBINDEX, TROPONINI in the last 168 hours. No results for input(s): PROBNP in the last 8760 hours. Coagulation Profile: No results for input(s): INR, PROTIME in the last 168 hours. Thyroid Function Tests: No results for input(s): TSH, T4TOTAL, FREET4, T3FREE, THYROIDAB in the last 72 hours. Lipid Profile: No results for input(s): CHOL, HDL, LDLCALC, TRIG, CHOLHDL,  LDLDIRECT in the last 72 hours. Anemia Panel: No results for input(s): VITAMINB12, FOLATE, FERRITIN, TIBC, IRON, RETICCTPCT in the last 72 hours. Urine analysis:    Component Value Date/Time   COLORURINE YELLOW 02/14/2016 1844   APPEARANCEUR CLEAR 02/14/2016 1844   LABSPEC 1.013 02/14/2016 1844   PHURINE 5.5 02/14/2016 1844   GLUCOSEU NEGATIVE 02/14/2016 1844   HGBUR TRACE (A) 02/14/2016 1844   BILIRUBINUR NEGATIVE 02/14/2016 1844   BILIRUBINUR n 12/06/2010 0000   KETONESUR NEGATIVE 02/14/2016 1844   PROTEINUR NEGATIVE 02/14/2016 1844   UROBILINOGEN 0.2 12/19/2011 1104   NITRITE NEGATIVE 02/14/2016 1844   LEUKOCYTESUR NEGATIVE 02/14/2016 1844   Sepsis Labs: Invalid input(s): PROCALCITONIN, [place]  Microbiology: Recent Results (from the past 240 hour(s))  SARS Coronavirus 2 by RT PCR (hospital order, performed in [facility] hospital lab) Nasopharyngeal Nasopharyngeal Swab     Status: None   Collection Time: 12/31/19  2:17 AM   Specimen: Nasopharyngeal Swab  Result Value Ref Range Status   SARS Coronavirus 2 NEGATIVE NEGATIVE Final    Comment: (NOTE) SARS-CoV-2 target nucleic acids are NOT DETECTED.  The SARS-CoV-2 RNA is generally detectable in upper and lower respiratory specimens during the acute phase of infection. The lowest concentration of SARS-CoV-2 viral copies this assay can detect is 250 copies / mL. A negative result does not preclude SARS-CoV-2 infection and should not be used as the sole basis for treatment or other patient management decisions.  A negative result may occur with improper specimen collection / handling, submission of specimen other than nasopharyngeal swab, presence of viral mutation(s) within the areas targeted by this assay, and inadequate number of viral copies (<250 copies / mL). A negative result must be combined with clinical observations, patient history, and epidemiological information.  Fact Sheet for Patients:    StrictlyIdeas.no  Fact Sheet for Healthcare Providers: BankingDealers.co.za  This test is not yet approved or  cleared by the [place] FDA and has been authorized for detection and/or diagnosis of SARS-CoV-2 by FDA under an Emergency Use Authorization (EUA).  This EUA will remain in effect (meaning this test can be used) for the duration of the COVID-19 declaration under Section 564(b)(1) of the Act, 21 U.S.C. section 360bbb-3(b)(1), unless the authorization is terminated or revoked sooner.  Performed at [facility]  [address], [place], [place] [address]     Radiology Studies: MR THORACIC SPINE WO CONTRAST  Result Date: 12/31/2019 CLINICAL DATA:  86 year old female with increasing back pain. T12 vertebral fracture with progressive loss of height by CT today compared to 12/04/2019. EXAM: MRI THORACIC AND LUMBAR SPINE WITHOUT CONTRAST TECHNIQUE: Multiplanar and multiecho pulse sequences of the thoracic and lumbar spine, were obtained without intravenous contrast. COMPARISON:  CT lumbar spine today and 12/04/2019. FINDINGS: MRI THORACIC SPINE FINDINGS Limited cervical spine imaging: Normal cervicothoracic junction alignment. Thoracic spine segmentation:  Appears to be normal. Alignment:  Preserved thoracic kyphosis.  No spondylolisthesis. Vertebrae: Severe compression fracture of T12 with comminution and vertebral gas compatible with osteonecrosis. Moderate to severe loss of vertebral body height and retropulsion appear stable from the CT earlier today. No dissociated spinal stenosis at the level of the conus on series 5, image 9 and series 8, image 52. This is moderate to severe (AP thecal sac reduced to 3-4 mm) and there is mass effect on the conus (series 7, image 4) although no conus signal abnormality is evident. T11 and T12 neural foraminal involvement is less pronounced. Signal heterogeneity in the T11-T12 disc is likely  posttraumatic. There is faint marrow edema also at the anterior T11 body and inferior T11 endplate. But no other osseous abnormality in the thoracic spine. Cord: No abnormal thoracic spinal cord signal despite stenosis with mass effect at both T6-T7 and T12. The conus is at T12. Paraspinal and other soft tissues: Negative visible thoracic viscera aside from trace layering pleural effusions. Negative visible upper abdominal viscera. Thoracic paraspinal soft tissues remain normal. Disc levels: Mild or age-appropriate thoracic spine degeneration except as follows: T6-T7: Bulky right paracentral disc herniation (series 5 image 9 and series 8, image 28) with mild spinal stenosis and right hemi cord mass effect. No foraminal involvement. T12 retropulsion superimposed on mild disc bulging and posterior element hypertrophy at both T11-T12 and T12-L1. This level was already discussed above. MRI LUMBAR SPINE FINDINGS Segmentation:  Normal, concordant with the thoracic spine numbering. Alignment: Stable lumbar lordosis from the CT earlier today. Mild grade 1 anterolisthesis at both L4-L5 and L5-S1. Vertebrae: On these images no lumbar marrow edema or acute osseous abnormality is identified. Visible sacrum and SI joints appear intact. Conus medullaris and cauda equina: The conus is compressed at T12 as detailed above. Cauda equina nerve roots appear normal aside from degenerative lumbar spinal stenosis. Paraspinal and other soft tissues: Sludge layering in the gallbladder on series 7, image 19. Otherwise negative visible abdominal viscera. Lumbar paraspinal soft tissues are within normal limits. Disc levels: Mostly age-appropriate lumbar spine degeneration, with mild multifactorial lumbar spinal stenosis from L1-L2 through L4-L5. Ligament flavum hypertrophy and/or facet arthropathy are severe from L3-L4 through L5-S1. Associated moderate to severe neural foraminal stenosis at the left L3, right L4 nerve levels. IMPRESSION: 1.  Severe T12 compression fracture with osteonecrosis and retropulsion seen by CT today. Retropulsion results in moderate mass effect on the conus medullaris, but no conus signal abnormality is identified. 2. Mild marrow edema at the inferior T11 body, without evidence of impending collapse. 3. Superimposed degenerative thoracic disc herniation at T6-T7 results in mild spinal stenosis and cord mass effect, no cord signal abnormality. 4. No acute osseous abnormality identified in the lumbar spine. Lumbar spine degeneration resulting in generalized mild lumbar spinal stenosis, and up to severe neural foraminal stenosis at the left L3 and right L4 nerve levels. Electronically Signed  By: Genevie Ann M.D.   On: 12/31/2019 18:39   MR LUMBAR SPINE WO CONTRAST  Result Date: 12/31/2019 CLINICAL DATA:  86 year old female with increasing back pain. T12 vertebral fracture with progressive loss of height by CT today compared to 12/04/2019. EXAM: MRI THORACIC AND LUMBAR SPINE WITHOUT CONTRAST TECHNIQUE: Multiplanar and multiecho pulse sequences of the thoracic and lumbar spine, were obtained without intravenous contrast. COMPARISON:  CT lumbar spine today and 12/04/2019. FINDINGS: MRI THORACIC SPINE FINDINGS Limited cervical spine imaging: Normal cervicothoracic junction alignment. Thoracic spine segmentation:  Appears to be normal. Alignment:  Preserved thoracic kyphosis.  No spondylolisthesis. Vertebrae: Severe compression fracture of T12 with comminution and vertebral gas compatible with osteonecrosis. Moderate to severe loss of vertebral body height and retropulsion appear stable from the CT earlier today. No dissociated spinal stenosis at the level of the conus on series 5, image 9 and series 8, image 52. This is moderate to severe (AP thecal sac reduced to 3-4 mm) and there is mass effect on the conus (series 7, image 4) although no conus signal abnormality is evident. T11 and T12 neural foraminal involvement is less  pronounced. Signal heterogeneity in the T11-T12 disc is likely posttraumatic. There is faint marrow edema also at the anterior T11 body and inferior T11 endplate. But no other osseous abnormality in the thoracic spine. Cord: No abnormal thoracic spinal cord signal despite stenosis with mass effect at both T6-T7 and T12. The conus is at T12. Paraspinal and other soft tissues: Negative visible thoracic viscera aside from trace layering pleural effusions. Negative visible upper abdominal viscera. Thoracic paraspinal soft tissues remain normal. Disc levels: Mild or age-appropriate thoracic spine degeneration except as follows: T6-T7: Bulky right paracentral disc herniation (series 5 image 9 and series 8, image 28) with mild spinal stenosis and right hemi cord mass effect. No foraminal involvement. T12 retropulsion superimposed on mild disc bulging and posterior element hypertrophy at both T11-T12 and T12-L1. This level was already discussed above. MRI LUMBAR SPINE FINDINGS Segmentation:  Normal, concordant with the thoracic spine numbering. Alignment: Stable lumbar lordosis from the CT earlier today. Mild grade 1 anterolisthesis at both L4-L5 and L5-S1. Vertebrae: On these images no lumbar marrow edema or acute osseous abnormality is identified. Visible sacrum and SI joints appear intact. Conus medullaris and cauda equina: The conus is compressed at T12 as detailed above. Cauda equina nerve roots appear normal aside from degenerative lumbar spinal stenosis. Paraspinal and other soft tissues: Sludge layering in the gallbladder on series 7, image 19. Otherwise negative visible abdominal viscera. Lumbar paraspinal soft tissues are within normal limits. Disc levels: Mostly age-appropriate lumbar spine degeneration, with mild multifactorial lumbar spinal stenosis from L1-L2 through L4-L5. Ligament flavum hypertrophy and/or facet arthropathy are severe from L3-L4 through L5-S1. Associated moderate to severe neural foraminal  stenosis at the left L3, right L4 nerve levels. IMPRESSION: 1. Severe T12 compression fracture with osteonecrosis and retropulsion seen by CT today. Retropulsion results in moderate mass effect on the conus medullaris, but no conus signal abnormality is identified. 2. Mild marrow edema at the inferior T11 body, without evidence of impending collapse. 3. Superimposed degenerative thoracic disc herniation at T6-T7 results in mild spinal stenosis and cord mass effect, no cord signal abnormality. 4. No acute osseous abnormality identified in the lumbar spine. Lumbar spine degeneration resulting in generalized mild lumbar spinal stenosis, and up to severe neural foraminal stenosis at the left L3 and right L4 nerve levels. Electronically Signed   By: Lemmie Evens  [place] Crane M.D.   On: 12/31/2019 18:39      [place] Cornia T. [place]  If 7PM-7AM, please contact night-coverage [email or web address] Password [facility] 01/01/2020, 2:27 PM

## 2020-01-01 NOTE — Consult Note (Signed)
Subjective:   Patient is a 86 y.o. female with osteoporosis who suffered a T12 compression fracture with burst morphology in May.  She was seen by my partner Dr. Trenton Gammon who recommended nonoperative management with a TLSO brace.  She went to a Rehab/SNF and her back pain was doing better, but she recently began having burning pains in both of her legs that was fairly disabling.  She was brought to the hospital where subsequent CT showed mild worsening of her T12 fracture, with greater loss of height.  MRI showed stenosis at the conus level without signal change.  She has not had issues with bowel movements, though she has noticed some hesitancy.  No numbness.  She reports her back pain is fairly mild at this time.  Patient Active Problem List   Diagnosis Date Noted  . T12 compression fracture ([place]) 01/01/2020  . Intractable pain 12/31/2019  . Low back pain 12/31/2019  . Palliative care by specialist   . Goals of care, counseling/discussion   . DNR (do not resuscitate)   . Compression fracture of T12 vertebra ([place]) 12/04/2019  . Spinal fracture of T12 vertebra ([place]) 12/04/2019  . Fever, unspecified 02/15/2016  . Anxiety 02/15/2016  . Anemia 02/15/2016  . Hyponatremia 02/15/2016  . Bradycardia   . Constipation   . Depression   . MDD (major depressive disorder), recurrent severe, without psychosis ([place])   . Symptomatic bradycardia 01/30/2016  . Symptomatic anemia 01/29/2016  . Hypertension   . Osteoporosis   . Hyperlipidemia   . GERD (gastroesophageal reflux disease)   . Spinal stenosis   . Hearing loss 12/08/2011  . Osteoarthritis of hip 12/02/2011  . Endometriosis   . DIVERTICULOSIS-COLON 02/12/2009  . ALLERGIC RHINITIS 01/19/2007  . ESOPHAGEAL STRICTURE 01/19/2007   Past Medical History:  Diagnosis Date  . Allergy   . Arthritis   . Depression   . Endometriosis   . GERD (gastroesophageal reflux disease)   . Hearing loss 12/08/2011   bil, hearing aids  . Hyperlipidemia   .  Hypertension   . Osteoporosis   . Ovarian cyst   . Polymyalgia rheumatica ([place])   . Post herpetic neuralgia   . S/P right THA, AA 12/16/2011  . Shingles 2020  . Skin cancer   . Spinal stenosis     Past Surgical History:  Procedure Laterality Date  . CATARACT EXTRACTION, BILATERAL  12-08-11   bil  . DILATION AND CURETTAGE OF UTERUS    . HERNIA REPAIR    . HYSTEROSCOPY    . OOPHORECTOMY     bilateral  . STERIOD INJECTION  12/09/11   to spine for rt calf pain and spasms  . TIBIA FRACTURE SURGERY     trauma  . TONSILLECTOMY AND ADENOIDECTOMY    . TOTAL HIP ARTHROPLASTY  12/16/2011   Procedure: TOTAL HIP ARTHROPLASTY ANTERIOR APPROACH;  Surgeon: Mauri Pole, MD;  Location: [facility];  Service: Orthopedics;  Laterality: Right;    Medications Prior to Admission  Medication Sig Dispense Refill Last Dose  . acetaminophen (TYLENOL) 325 MG tablet Take 650 mg by mouth every 4 (four) hours as needed for mild pain, moderate pain, fever or headache.    12/30/2019 at Unknown time  . bisacodyl (DULCOLAX) 5 MG EC tablet Take 1 tablet (5 mg total) by mouth daily as needed for moderate constipation. 30 tablet 0 Past Week at Unknown time  . Cholecalciferol (VITAMIN D3) 50 MCG (2000 UT) TABS Take 2,000 Units by mouth daily.  12/30/2019 at Unknown time  . docusate sodium (COLACE) 100 MG capsule Take 2 capsules (200 mg total) by mouth 2 (two) times daily as needed for mild constipation. 30 capsule 0 Past Week at Unknown time  . escitalopram (LEXAPRO) 5 MG tablet Take 5 mg by mouth in the morning and at bedtime.    12/30/2019 at Unknown time  . feeding supplement, ENSURE ENLIVE, (ENSURE ENLIVE) LIQD Take 237 mLs by mouth 2 (two) times daily between meals. Give 2 week supply 237 mL 0 12/30/2019 at Unknown time  . gabapentin (NEURONTIN) 300 MG capsule Take 300 mg by mouth in the morning and at bedtime.   12/30/2019 at Unknown time  . hydrALAZINE (APRESOLINE) 25 MG tablet Take 1 tablet (25 mg total) by mouth every 8  (eight) hours. 90 tablet 0 12/30/2019 at Unknown time  . HYDROcodone-acetaminophen (NORCO/VICODIN) 5-325 MG tablet Take 1 tablet by mouth every 6 (six) hours as needed for moderate pain.   12/30/2019 at Unknown time  . lidocaine (LIDODERM) 5 % Place 1 patch onto the skin daily. Remove & Discard patch within 12 hours or as directed by MD 30 patch 0 12/30/2019 at Unknown time  . losartan (COZAAR) 50 MG tablet Take 50 mg by mouth daily.   12/30/2019 at Unknown time  . meloxicam (MOBIC) 7.5 MG tablet Take 7.5 mg by mouth 2 (two) times daily.   12/30/2019 at Unknown time  . mirtazapine (REMERON) 15 MG tablet Take 15 mg by mouth at bedtime.   12/30/2019 at Unknown time  . omeprazole (PRILOSEC) 20 MG capsule Take 1 capsule (20 mg total) by mouth daily. 90 capsule 0 12/30/2019 at Unknown time  . simvastatin (ZOCOR) 20 MG tablet TAKE 1 TABLET EVERY DAY (Patient taking differently: Take 20 mg by mouth every evening. ) 90 tablet 0 12/30/2019 at Unknown time   Allergies  Allergen Reactions  . Cartia Xt [Diltiazem] Other (See Comments)    BRADYCARDIA IN 40'S  . Codeine Other (See Comments)    syncope  . Lisinopril-Hydrochlorothiazide Cough  . Mercury Rash  . Sulfa Antibiotics   . Sulfonamide Derivatives Rash    swelling    Social History   Tobacco Use  . Smoking status: Former Research scientist (life sciences)  . Smokeless tobacco: Never Used  Substance Use Topics  . Alcohol use: No    Alcohol/week: 1.0 standard drink    Types: 1 Standard drinks or equivalent per week    Family History  Problem Relation Age of Onset  . Stroke Sister   . Diabetes Daughter   . Cancer Father        esophogeal  . Cancer Paternal Aunt        stomach  . Breast cancer Maternal Grandmother        Age 50  . Stroke Mother     Review of Systems 10 of 12 systems reviewed.  Objective:   Patient Vitals for the past 8 hrs:  BP Temp Temp src Pulse Resp SpO2  01/01/20 1405 (!) 143/78 98.3 F (36.8 C) Oral 72 14 96 %  01/01/20 0803 (!) 171/79  98.2 F (36.8 C) Oral 66 14 95 %   I/O last 3 completed shifts: In: 502.1 [I.V.:502.1] Out: 550 [Urine:550] Total I/O In: 0  Out: 650 [Urine:650]  Awake, alert, oriented x 3.  In some discomfort. Bed 15 degrees. 5/5 strength HF, KE, DF, PF bilaterally.  Pain with leg flexion. No sensory deficit to light touch   Data Review: see HPI  Assessment:   Principal Problem:   Intractable pain Active Problems:   Hypertension   Hyperlipidemia   Compression fracture of T12 vertebra (H[facility])   Low back pain   Palliative care by specialist   Goals of care, counseling/discussion   DNR (do not resuscitate)   T12 compression fracture ([place])   Plan:   86 yo F with T12 compression fracture and associated stenosis who has neuropathic pain in both legs - I had a long discussion with the patient as well as a 22 minute discussion with the patient's daughter and son via telephone. Unfortunately, surgical options for treating this fracture are limited.  She is a poor candidate for extensive surgery with vertebrectomy and reconstruction given her age and her bone quality. Even a limited surgery with tamping of bone fragments and decompression would require multilevel instrumentation with cement augmented pedicle screw/rod construct. Patient was firmly against the idea of any sort of instrumented surgery.  Kyphoplasty would carry some significant risks of intracanalicular cement extravasation, and is very unlikely to help with her leg pain. Given her back pain is actually improving and more mild at this point, I would not recommend it.  I would suggest aggressively treating the patient's neuropathic pain medically with increasing her gabapentin dose, starting a steroid taper, and potentially attempting additional neuropathic pain meds (amitryptiline, duloxetine) as needed.  She will need to continue to wear her TLSO brace when OOB.  The family inquired about disposition options and I told them these will be  clarified by the case manager in the upcoming week.  All questions and concerns were answered.

## 2020-01-01 NOTE — Progress Notes (Signed)
Palliative Medicine Inpatient Follow Up Note  Reason for consult:  Goals of Care "Goal of care discussion. Here is with progressive T12 fracture"  HPI:  Per intake H&P --> [name] a 86 y.o.femalewithhistory of hypertension hyperlipidemia was recently admitted for T12 compression fracture for which neurosurgery recommended conservative management with TLSO brace was eventually discharged to rehab. Patient states he was doing well until 24 hours ago when patient's pain worsened suddenly. Pain is radiating to both of the lower extremities. Denies any incontinence of urine or bowel.  Palliative care was asked to help aid in goals of care conversations.   Today's Discussion (01/01/2020): I have reviewed medical records including EPIC notes, labs and imaging, received report from bedside RN, Doroteo Bradford who stated that patient intermittently woke up throughout the evening. I assessed the patient. [name] appeared far more comfortable this morning as compared to the prior afternoon. She asked if she will be able to eat today and when she can get her teeth cleaned. We discussed that the neurosurgery team still needs to evaluate her. She endorses improvement in pain to a more tolerable level.   I was able to speak to patients daughter, [name] and introduce myself as a Palliative provider and our role in the patients care. She requested an update from Dr. Cyndia Skeeters directly to understand the medical plan moving forward this message was relayed to him. I informed her that we are involved as an extra layer of support for the patient and family. Described the medications that the patient is on presently for pain.   Discussed the importance of continued conversation with family and their  medical providers regarding overall plan of care and treatment options, ensuring decisions are within the context of the patients values and GOCs.  Provided "Hard Choices for [employer]" booklet.   Vital Signs Vitals:     01/01/20 0318 01/01/20 0803  BP: 111/60 (!) 171/79  Pulse: 64 66  Resp: 14 14  Temp: 98.5 F (36.9 C) 98.2 F (36.8 C)  SpO2: 96% 95%    Intake/Output Summary (Last 24 hours) at 01/01/2020 1610 Last data filed at 01/01/2020 0849 Gross per 24 hour  Intake 502.13 ml  Output 550 ml  Net -47.87 ml   Last Weight  Most recent update: 12/31/2019 12:19 AM   Weight  70.3 kg (155 lb)           Gen:  Elderly F in NAD HEENT: Drymucous membranes CV: Regular rate and rhythm, no murmurs rubs or gallops PULM: clear to auscultation bilaterally  ABD: soft/nontender  EXT: No edema  Neuro: Alert and oriented x4  SUMMARY OF RECOMMENDATIONS DNAR/DNI  MOST completed and placed on chart  Bladder scan to check for urinary retention  Will order laxatives in the setting of ongoing opioid use  Continue flexeril TID PRN for muscle spasms the benefits at this point outweigh the risks   Asked Primary to update family  Continue PMT support  Time Spent: 35 Greater than 50% of the time was spent in counseling and coordination of care ______________________________________________________________________________________ [facility] Team Team Cell Phone: [phone] Please utilize secure chat with additional questions, if there is no response within 30 minutes please call the above phone number  Palliative Medicine Team providers are available by phone from 7am to 7pm daily and can be reached through the team cell phone.  Should this patient require assistance outside of these hours, please call the patient's attending physician.

## 2020-01-01 NOTE — Plan of Care (Signed)
  Problem: Education: Goal: Knowledge of General Education information will improve Description: Including pain rating scale, medication(s)/side effects and non-pharmacologic comfort measures Outcome: Progressing   Problem: Activity: Goal: Risk for activity intolerance will decrease Outcome: Progressing   Problem: Elimination: Goal: Will not experience complications related to urinary retention Outcome: Progressing   Problem: Pain Managment: Goal: General experience of comfort will improve Outcome: Progressing   Problem: Safety: Goal: Ability to remain free from injury will improve Outcome: Progressing   

## 2020-01-02 LAB — CREATININE, SERUM
Creatinine, Ser: 0.61 mg/dL (ref 0.44–1.00)
GFR calc Af Amer: >60 mL/min (ref >60)
GFR calc non Af Amer: >60 mL/min (ref >60)

## 2020-01-02 LAB — HEMOGLOBIN AND HEMATOCRIT, BLOOD
HCT: 34.5 % — ABNORMAL LOW (ref 36.0–46.0)
Hemoglobin: 10.9 g/dL — ABNORMAL LOW (ref 12.0–15.0)

## 2020-01-02 LAB — GLUCOSE, CAPILLARY
Glucose-Capillary: 163 mg/dL — ABNORMAL HIGH (ref 70–99)
Glucose-Capillary: 119 mg/dL — ABNORMAL HIGH (ref 70–99)
Glucose-Capillary: 120 mg/dL — ABNORMAL HIGH (ref 70–99)

## 2020-01-02 LAB — CBC
HCT: 32.7 % — ABNORMAL LOW (ref 36.0–46.0)
Hemoglobin: 10.5 g/dL — ABNORMAL LOW (ref 12.0–15.0)
MCH: 29.5 pg (ref 26.0–34.0)
MCHC: 32.1 g/dL (ref 30.0–36.0)
MCV: 91.9 fL (ref 80.0–100.0)
Platelets: 229 10*3/uL (ref 150–400)
RBC: 3.56 MIL/uL — ABNORMAL LOW (ref 3.87–5.11)
RDW: 13.1 % (ref 11.5–15.5)
WBC: 8.9 10*3/uL (ref 4.0–10.5)
nRBC: 0 % (ref 0.0–0.2)

## 2020-01-02 LAB — MAGNESIUM: Magnesium: 1.8 mg/dL (ref 1.7–2.4)

## 2020-01-02 MED ORDER — HYDROMORPHONE HCL 1 MG/ML IJ SOLN
0.5000 mg | INTRAMUSCULAR | Status: DC | PRN
  Administered 2020-01-02 – 2020-01-04 (×6): 0.5 mg via INTRAVENOUS
  Filled 2020-01-02 (×6): qty 1

## 2020-01-02 MED ORDER — DULOXETINE HCL 30 MG PO CPEP
30.0000 mg | ORAL_CAPSULE | Freq: Every day | ORAL | Status: DC
  Administered 2020-01-02 – 2020-01-06 (×5): 30 mg via ORAL
  Filled 2020-01-02 (×5): qty 1

## 2020-01-02 MED ORDER — ENOXAPARIN SODIUM 40 MG/0.4ML ~~LOC~~ SOLN
40.0000 mg | SUBCUTANEOUS | Status: DC
  Administered 2020-01-02 – 2020-01-06 (×5): 40 mg via SUBCUTANEOUS
  Filled 2020-01-02 (×5): qty 0.4

## 2020-01-02 MED ORDER — CALCITONIN (SALMON) 200 UNIT/ACT NA SOLN
1.0000 | Freq: Every day | NASAL | Status: DC
  Administered 2020-01-02 – 2020-01-06 (×3): 1 via NASAL
  Filled 2020-01-02: qty 3.7

## 2020-01-02 MED ORDER — AMLODIPINE BESYLATE 10 MG PO TABS
10.0000 mg | ORAL_TABLET | Freq: Every day | ORAL | Status: DC
  Administered 2020-01-02 – 2020-01-06 (×5): 10 mg via ORAL
  Filled 2020-01-02 (×5): qty 1

## 2020-01-02 NOTE — Progress Notes (Signed)
Orthopedic Tech Progress Note Patient Details:  [name] 13-Dec-1922 [id number] Called in order to [facility] for a TLSO brace Patient ID: [name], female   DOB: 11-15-1922, 86 y.o.   MRN: [id number]   [name] 01/02/2020, 3:38 PM

## 2020-01-02 NOTE — Progress Notes (Signed)
PROGRESS NOTE  [name] ZWC:[id number] DOB: 21-Feb-1923   PCP: Harrison Mons, PA  Patient is from: Home  DOA: 12/31/2019 LOS: 1  Brief Narrative / Interim history: 86 year old female with history of HTN, HLD and recent T12 compression fracture after fall presenting with acute on chronic pain.  Reportedly, her pain gotten worse in the last 24 hours that prompted her to come to ED.   Of note, patient has had 4 prescriptions of Percocet (#90 on 6/9, #30 on 6/4, #28 on 5/24 and #18 on 5/21 on discharge from hospital).   In ED, she received 2 doses of IV Dilaudid 1 mg 30 minutes apart without adequate pain control which prompted admission for pain control.  On admission, patient was somewhat lethargic.   The next day, patient continued to endorse significant bilateral calf pain without back pain.  CT lumbar spine ordered and revealed known comminuted T12 body fracture with progressive height loss, retropulsion and related advanced spinal stenosis without signs of interval healing.  Neurosurgery, Dr. Marcello Moores consulted and recommended MRI of thoracic spine and lumbar spine, and Dr. [name] to see patient the next day.  MRI obtained later in the day showed severe T12 compression fracture with osteonecrosis and retropulsion resulting in moderate mass-effect on the conus medullaris without conus signal abnormality, mild marrow edema at the inferior T11 body without evidence of impending collapse and superimposed degenerative thoracic disc herniation at T6-T7 resulting in mild spinal stenosis and cord mass-effect without cord signal abnormality.   Evaluated by neurosurgery, Dr. Marcello Moores on 6/13 who recommended conservative care with aggressive pain control given risk of surgery and kyphoplasty.   Subjective: Seen and examined earlier this morning.  No major events overnight of this morning.  She is somewhat sleepy but wakes to voice easily.  Not quite alert.  She reports 8/10 pain when she moves her legs.  Not pain now when she is at rest.  Denies chest pain, dyspnea, bowel or bladder issue.  Objective: Vitals:   01/01/20 1952 01/02/20 0353 01/02/20 0745 01/02/20 1420  BP: (!) 146/80 (!) 150/85 (!) 195/85 123/74  Pulse: 74 64 69 66  Resp: 17 17 17 16   Temp: 98.8 F (37.1 C) 97.7 F (36.5 C) 97.6 F (36.4 C) 98 F (36.7 C)  TempSrc: Oral Oral Oral Oral  SpO2: 92% 98% 98% 98%  Weight:      Height:        Intake/Output Summary (Last 24 hours) at 01/02/2020 1533 Last data filed at 01/02/2020 0920 Gross per 24 hour  Intake 600 ml  Output 700 ml  Net -100 ml   Filed Weights   12/31/19 0018  Weight: 70.3 kg    Examination:  GENERAL: No apparent distress.  Nontoxic. HEENT: MMM.  Vision and hearing grossly intact.  NECK: Supple.  No apparent JVD.  RESP:  No IWOB.  Fair aeration bilaterally. CVS:  RRR. Heart sounds normal.  ABD/GI/GU: BS+. Abd soft, NTND.  MSK/EXT:  Moves extremities. No apparent deformity. No edema.  SKIN: no apparent skin lesion or wound NEURO: Sleepy but wakes to voice easily.  Oriented fairly.  No apparent focal neuro deficit.  Motor 4+/5 in BLE. PSYCH: Calm. Normal affect.  Procedures:  None  Microbiology summarized: COVID-19 PCR negative.  Assessment & Plan: Progressive T12 compression fracture with retropulsion and spinal stenosis Degenerative thoracic disc herniation at T6-T7 resulting in mild spinal stenosis and cord mass-effect Bilateral lower extremity pain-suspect claudication due to the above. Chronic back pain-likely due  to the above -No cauda equina signs.  About 4+/5 BLE strength on exam. Doubt DVT in BLE. Hoffmann signs negative.  -No plan for surgical intervention or kyphoplasty per neurosurgery. -TLSO brace when up and out of bed. -Decrease Dilaudid to 0.5 mg every 4 hours as needed for severe pain-patient might have opiate tolerance -Percocet 5 mg every 6 hours as needed for moderate pain -Continue scheduled Tylenol 1 g every 8 hours  and Flexeril 5 mg every 8 hours as needed -Continue gabapentin to 300 mg after breakfast and lunch, and 600 mg at night -Continue IV Decadron 4 mg every 8 hours as recommended by Dr. Marcello Moores -Start subcu Lovenox for VT prophylaxis  Essential hypertension: Hypotensive this morning. -Add amlodipine -Pain control as above -As needed hydralazine  Normocytic anemia: Stable -Check anemia panel and monitor.  Hypomagnesemia: Resolved. -Monitor and replenish as appropriate.  Hyperlipidemia -Continue home statin  Goal of care/DNR/DNI-appropriate -Appreciate palliative care input.           DVT prophylaxis:  enoxaparin (LOVENOX) injection 40 mg Start: 01/02/20 1545 Place and maintain sequential compression device Start: 12/31/19 1444  Code Status: DNR/DNI Family Communication: Updated patient's daughter over the phone on 6/13. Status is: Inpatient  Discharge barrier: Ongoing active pain requiring inpatient pain management and IV treatments appropriate due to intensity of illness or inability to take PO   Dispo: The patient is from: Home              Anticipated d/c is to: To be determined              Anticipated d/c date is: > 3 days              Patient currently is not medically stable to d/c.       Consultants:  Neurosurgery Palliative medicine  Sch Meds:  Scheduled Meds: . acetaminophen  1,000 mg Oral Q8H  . amLODipine  10 mg Oral Daily  . dexamethasone (DECADRON) injection  4 mg Intravenous Q8H  . enoxaparin (LOVENOX) injection  40 mg Subcutaneous Q24H  . gabapentin  300 mg Oral BID WC   And  . gabapentin  600 mg Oral QHS  . lidocaine  2 patch Transdermal Q24H  . senna-docusate  2 tablet Oral BID   Continuous Infusions:  PRN Meds:.cyclobenzaprine, hydrALAZINE, HYDROmorphone (DILAUDID) injection, ondansetron **OR** ondansetron (ZOFRAN) IV, oxyCODONE, polyethylene glycol  Antimicrobials: Anti-infectives (From admission, onward)   None       I have  personally reviewed the following labs and images: CBC: Recent Labs  Lab 12/31/19 0120 12/31/19 0613 01/01/20 0929 01/02/20 0301  WBC 7.5 5.9 6.4  --   NEUTROABS 4.5 3.0  --   --   HGB 11.5* 9.9* 9.9* 10.9*  HCT 37.0 32.6* 32.3* 34.5*  MCV 95.1 96.4 96.7  --   PLT 277 264 221  --    BMP &GFR Recent Labs  Lab 12/31/19 0120 12/31/19 0613 01/01/20 0929 01/02/20 0301  NA 134* 136 137  --   K 3.9 3.9 4.3  --   CL 96* 100 102  --   CO2 27 28 28   --   GLUCOSE 109* 108* 108*  --   BUN 23 21 17   --   CREATININE 0.97 1.04* 0.82  --   CALCIUM 9.5 9.2 9.0  --   MG  --   --  1.5* 1.8  PHOS  --   --  4.0  --    Estimated Creatinine  Clearance: 39.9 mL/min (by C-G formula based on SCr of 0.82 mg/dL). Liver & Pancreas: Recent Labs  Lab 12/31/19 0613 01/01/20 0929  AST 18  --   ALT 14  --   ALKPHOS 97  --   BILITOT 0.4  --   PROT 5.8*  --   ALBUMIN 3.0* 2.9*   No results for input(s): LIPASE, AMYLASE in the last 168 hours. No results for input(s): AMMONIA in the last 168 hours. Diabetic: No results for input(s): HGBA1C in the last 72 hours. Recent Labs  Lab 01/01/20 1150 01/01/20 1622 01/01/20 1953 01/02/20 0545 01/02/20 1140  GLUCAP 111* 155* 172* 163* 119*   Cardiac Enzymes: No results for input(s): CKTOTAL, CKMB, CKMBINDEX, TROPONINI in the last 168 hours. No results for input(s): PROBNP in the last 8760 hours. Coagulation Profile: No results for input(s): INR, PROTIME in the last 168 hours. Thyroid Function Tests: No results for input(s): TSH, T4TOTAL, FREET4, T3FREE, THYROIDAB in the last 72 hours. Lipid Profile: No results for input(s): CHOL, HDL, LDLCALC, TRIG, CHOLHDL, LDLDIRECT in the last 72 hours. Anemia Panel: No results for input(s): VITAMINB12, FOLATE, FERRITIN, TIBC, IRON, RETICCTPCT in the last 72 hours. Urine analysis:    Component Value Date/Time   COLORURINE YELLOW 02/14/2016 1844   APPEARANCEUR CLEAR 02/14/2016 1844   LABSPEC 1.013  02/14/2016 1844   PHURINE 5.5 02/14/2016 1844   GLUCOSEU NEGATIVE 02/14/2016 1844   HGBUR TRACE (A) 02/14/2016 1844   BILIRUBINUR NEGATIVE 02/14/2016 1844   BILIRUBINUR n 12/06/2010 0000   KETONESUR NEGATIVE 02/14/2016 1844   PROTEINUR NEGATIVE 02/14/2016 1844   UROBILINOGEN 0.2 12/19/2011 1104   NITRITE NEGATIVE 02/14/2016 1844   LEUKOCYTESUR NEGATIVE 02/14/2016 1844   Sepsis Labs: Invalid input(s): PROCALCITONIN, [place]  Microbiology: Recent Results (from the past 240 hour(s))  SARS Coronavirus 2 by RT PCR (hospital order, performed in [facility] hospital lab) Nasopharyngeal Nasopharyngeal Swab     Status: None   Collection Time: 12/31/19  2:17 AM   Specimen: Nasopharyngeal Swab  Result Value Ref Range Status   SARS Coronavirus 2 NEGATIVE NEGATIVE Final    Comment: (NOTE) SARS-CoV-2 target nucleic acids are NOT DETECTED.  The SARS-CoV-2 RNA is generally detectable in upper and lower respiratory specimens during the acute phase of infection. The lowest concentration of SARS-CoV-2 viral copies this assay can detect is 250 copies / mL. A negative result does not preclude SARS-CoV-2 infection and should not be used as the sole basis for treatment or other patient management decisions.  A negative result may occur with improper specimen collection / handling, submission of specimen other than nasopharyngeal swab, presence of viral mutation(s) within the areas targeted by this assay, and inadequate number of viral copies (<250 copies / mL). A negative result must be combined with clinical observations, patient history, and epidemiological information.  Fact Sheet for Patients:   StrictlyIdeas.no  Fact Sheet for Healthcare Providers: BankingDealers.co.za  This test is not yet approved or  cleared by the [place] FDA and has been authorized for detection and/or diagnosis of SARS-CoV-2 by FDA under an Emergency Use  Authorization (EUA).  This EUA will remain in effect (meaning this test can be used) for the duration of the COVID-19 declaration under Section 564(b)(1) of the Act, 21 U.S.C. section 360bbb-3(b)(1), unless the authorization is terminated or revoked sooner.  Performed at [facility], [address], [place], [place] [address]     Radiology Studies: No results found.    Mattthew Ziomek T.  [place]  If 7PM-7AM, please contact night-coverage [email or web address] Password [facility] 01/02/2020, 3:33 PM

## 2020-01-02 NOTE — Progress Notes (Signed)
Pt refused to wear TLSO brace.

## 2020-01-02 NOTE — Progress Notes (Addendum)
Daily Progress Note   Patient Name: [name]       Date: 01/02/2020 DOB: 1923/04/16  Age: 86 y.o. MRN#: [id number] Attending Physician: Mercy Riding, MD Primary Care Physician: Harrison Mons, PA Admit Date: 12/31/2019  Reason for Consultation/Follow-up: Establishing goals of care  Subjective: Patient in bed- continues to have severe pain with any movement of her legs. Reviewed her pain management with her. Spoke with her daughter [name] via phone- Betsy asked if Dr. Trenton Gammon had seen patient today- noted to [facility] no notes in patient's chart. [name] inquired about medical plan- relayed current opinion of neurosurgery- continue to work to aggressively maximize medication related care for patient's pain. [name] also requested consideration of a nerve block.   Review of Systems  Constitutional: Positive for malaise/fatigue.  Musculoskeletal: Positive for back pain and myalgias.  Neurological: Positive for weakness.    Length of Stay: 1  Current Medications: Scheduled Meds:  . acetaminophen  1,000 mg Oral Q8H  . amLODipine  10 mg Oral Daily  . dexamethasone (DECADRON) injection  4 mg Intravenous Q8H  . enoxaparin (LOVENOX) injection  40 mg Subcutaneous Q24H  . gabapentin  300 mg Oral BID WC   And  . gabapentin  600 mg Oral QHS  . lidocaine  2 patch Transdermal Q24H  . senna-docusate  2 tablet Oral BID    Continuous Infusions:   PRN Meds: cyclobenzaprine, hydrALAZINE, HYDROmorphone (DILAUDID) injection, ondansetron **OR** ondansetron (ZOFRAN) IV, oxyCODONE, polyethylene glycol  Physical Exam Vitals and nursing note reviewed.  Constitutional:      Appearance: Normal appearance.  Cardiovascular:     Rate and Rhythm: Normal rate.     Pulses: Normal pulses.  Pulmonary:      Effort: Pulmonary effort is normal.  Skin:    General: Skin is warm and dry.  Neurological:     Mental Status: She is alert.             Vital Signs: BP 123/74 (BP Location: Right Arm)   Pulse 66   Temp 98 F (36.7 C) (Oral)   Resp 16   Ht 5' 3.5" (1.613 m)   Wt 70.3 kg   SpO2 98%   BMI 27.03 kg/m  SpO2: SpO2: 98 % O2 Device: O2 Device: Nasal Cannula O2 Flow Rate: O2 Flow Rate (L/min):  1 L/min  Intake/output summary:   Intake/Output Summary (Last 24 hours) at 01/02/2020 1726 Last data filed at 01/02/2020 0920 Gross per 24 hour  Intake 600 ml  Output 700 ml  Net -100 ml   LBM:   Baseline Weight: Weight: 70.3 kg Most recent weight: Weight: 70.3 kg       Palliative Assessment/Data: PPS: 30%      Patient Active Problem List   Diagnosis Date Noted  . T12 compression fracture ([place]) 01/01/2020  . Intractable pain 12/31/2019  . Low back pain 12/31/2019  . Palliative care by specialist   . Goals of care, counseling/discussion   . DNR (do not resuscitate)   . Compression fracture of T12 vertebra ([place]) 12/04/2019  . Spinal fracture of T12 vertebra ([place]) 12/04/2019  . Fever, unspecified 02/15/2016  . Anxiety 02/15/2016  . Anemia 02/15/2016  . Hyponatremia 02/15/2016  . Bradycardia   . Constipation   . Depression   . MDD (major depressive disorder), recurrent severe, without psychosis ([place])   . Symptomatic bradycardia 01/30/2016  . Symptomatic anemia 01/29/2016  . Hypertension   . Osteoporosis   . Hyperlipidemia   . GERD (gastroesophageal reflux disease)   . Spinal stenosis   . Hearing loss 12/08/2011  . Osteoarthritis of hip 12/02/2011  . Endometriosis   . DIVERTICULOSIS-COLON 02/12/2009  . ALLERGIC RHINITIS 01/19/2007  . ESOPHAGEAL STRICTURE 01/19/2007    Palliative Care Assessment & Plan   Patient Profile: [name] a 86 y.o.femalewithhistory of hypertension hyperlipidemia was recently admitted for T12 compression fracture for which  neurosurgery recommended conservative management with TLSO brace was eventually discharged to rehab. Patient states he was doing well until 24 hours ago when patient's pain worsened suddenly. Pain is radiating to both of the lower extremities. Denies any incontinence of urine or bowel.  Palliative care was asked to help aid in goals of care conversations. Assessment/Recommendations/Plan   Continue current scope of care  Please see completed MOST form on file  Daughter [name] is eager to hear from Dr. Trenton Gammon- she is inquiring if a "nerve block" would be effective to patient   Will start duloxetine 30 mg daily for neuropathic pain- if tolerated- recommend increasing to 60mg  after one week  Goals of Care and Additional Recommendations:  Limitations on Scope of Treatment: Full Scope Treatment  Code Status:  DNR  Prognosis:   Unable to determine  Discharge Planning:  To Be Determined  Care plan was discussed with patient and her daughter- [name]  Thank you for allowing the Palliative Medicine Team to assist in the care of this patient.   Time In: 1430 Time Out: 1505 Total Time 35 minutes Prolonged Time Billed no      Greater than 50%  of this time was spent counseling and coordinating care related to the above assessment and plan.  Mariana Kaufman, AGNP-C Palliative Medicine   Please contact Palliative Medicine Team phone at [phone] for questions and concerns.

## 2020-01-02 NOTE — Progress Notes (Signed)
   01/02/20 1140  Clinical Encounter Type  Visited With Patient  Visit Type Initial;Spiritual support  Referral From Palliative care team  Consult/Referral To Chaplain  This chaplain responded to PMT consult for Pt. spiritual care. The Pt. greeted the chaplain with curiosity and willingness to tell her story.  The chaplain learned the Pt. faith community is [employer]. The Pt talks to the [place] frequently. The Pt. shared her love of life long learning with the chaplain. The chaplain encouraged the Pt. to share her concerns  about discharge placement and insurance coverage with the Pt. Case Manager. The chaplain accepted the Pt. invitation for F/U spiritual care.

## 2020-01-03 ENCOUNTER — Ambulatory Visit: Payer: Medicare HMO | Admitting: Psychology

## 2020-01-03 LAB — GLUCOSE, CAPILLARY
Glucose-Capillary: 121 mg/dL — ABNORMAL HIGH (ref 70–99)
Glucose-Capillary: 117 mg/dL — ABNORMAL HIGH (ref 70–99)
Glucose-Capillary: 128 mg/dL — ABNORMAL HIGH (ref 70–99)
Glucose-Capillary: 141 mg/dL — ABNORMAL HIGH (ref 70–99)
Glucose-Capillary: 167 mg/dL — ABNORMAL HIGH (ref 70–99)

## 2020-01-03 MED ORDER — POLYETHYLENE GLYCOL 3350 17 G PO PACK
17.0000 g | PACK | Freq: Every day | ORAL | Status: DC
  Administered 2020-01-03: 17 g via ORAL
  Filled 2020-01-03: qty 1

## 2020-01-03 MED FILL — Hydromorphone HCl Inj 2 MG/ML: INTRAMUSCULAR | Qty: 1 | Status: AC

## 2020-01-03 NOTE — NC FL2 (Signed)
[place] LEVEL OF CARE SCREENING TOOL     IDENTIFICATION  Patient Name: [name] Birthdate: 10-22-1922 Sex: female Admission Date (Current Location): 12/31/2019  [facility] and [place] Number:  Herbalist and Address:  [facility] [place], [place] [address] [place], [place] [address]      Provider Number: [id number]  Attending Physician Name and Address:  Mercy Riding, MD  Relative Name and Phone Number:  Salaya Holtrop - daughter - [phone]    Current Level of Care: SNF Recommended Level of Care: [place] Prior Approval Number:    Date Approved/Denied:   PASRR Number: [id number]  Discharge Plan: SNF    Current Diagnoses: Patient Active Problem List   Diagnosis Date Noted  . T12 compression fracture ([place]) 01/01/2020  . Intractable pain 12/31/2019  . Low back pain 12/31/2019  . Palliative care by specialist   . Goals of care, counseling/discussion   . DNR (do not resuscitate)   . Compression fracture of T12 vertebra ([place]) 12/04/2019  . Spinal fracture of T12 vertebra ([place]) 12/04/2019  . Fever, unspecified 02/15/2016  . Anxiety 02/15/2016  . Anemia 02/15/2016  . Hyponatremia 02/15/2016  . Bradycardia   . Constipation   . Depression   . MDD (major depressive disorder), recurrent severe, without psychosis ([place])   . Symptomatic bradycardia 01/30/2016  . Symptomatic anemia 01/29/2016  . Hypertension   . Osteoporosis   . Hyperlipidemia   . GERD (gastroesophageal reflux disease)   . Spinal stenosis   . Hearing loss 12/08/2011  . Osteoarthritis of hip 12/02/2011  . Endometriosis   . DIVERTICULOSIS-COLON 02/12/2009  . ALLERGIC RHINITIS 01/19/2007  . ESOPHAGEAL STRICTURE 01/19/2007    Orientation RESPIRATION BLADDER Height & Weight     Self, Time, Situation, Place  Normal External catheter Weight: 70.3 kg Height:  5' 3.5" (161.3 cm)  BEHAVIORAL SYMPTOMS/MOOD NEUROLOGICAL BOWEL NUTRITION STATUS       Continent Diet (See Discharge Summary)  AMBULATORY STATUS COMMUNICATION OF NEEDS Skin   Limited Assist Verbally Normal                       Personal Care Assistance Level of Assistance  Bathing, Feeding, Dressing Bathing Assistance: Limited assistance Feeding assistance: Independent Dressing Assistance: Limited assistance     Functional Limitations Info  Sight, Hearing, Speech Sight Info: Impaired Hearing Info: Adequate Speech Info: Adequate    SPECIAL CARE FACTORS FREQUENCY  PT (By licensed PT), OT (By licensed OT)     PT Frequency: 5 times per week OT Frequency: 5 times per week            Contractures Contractures Info: Not present    Additional Factors Info  Code Status, Allergies Code Status Info: DNR Allergies Info: cartia, codeine, lisinopril, mercury, sulfa           Current Medications (01/03/2020):  This is the current hospital active medication list Current Facility-Administered Medications  Medication Dose Route Frequency Provider Last Rate Last Admin  . acetaminophen (TYLENOL) tablet 1,000 mg  1,000 mg Oral Q8H Gonfa, Taye T, MD   1,000 mg at 01/03/20 1324  . amLODipine (NORVASC) tablet 10 mg  10 mg Oral Daily Wendee Beavers T, MD   10 mg at 01/03/20 0816  . calcitonin ([place]mon) (MIACALCIN/FORTICAL) nasal spray 1 spray  1 spray Alternating Nares q1800 Earlie Counts, NP   1 spray at 01/02/20 2000  . cyclobenzaprine (FLEXERIL) tablet 5  mg  5 mg Oral TID PRN Rosezella Rumpf, NP   5 mg at 01/02/20 0816  . dexamethasone (DECADRON) injection 4 mg  4 mg Intravenous Q8H Gonfa, Taye T, MD   4 mg at 01/03/20 1324  . DULoxetine (CYMBALTA) DR capsule 30 mg  30 mg Oral Daily Earlie Counts, NP   30 mg at 01/03/20 0817  . enoxaparin (LOVENOX) injection 40 mg  40 mg Subcutaneous Q24H Wendee Beavers T, MD   40 mg at 01/03/20 1556  . gabapentin (NEURONTIN) capsule 300 mg  300 mg Oral BID WC Gonfa, Taye T, MD   300 mg at 01/03/20 1220   And  . gabapentin (NEURONTIN)  capsule 600 mg  600 mg Oral QHS Wendee Beavers T, MD   600 mg at 01/02/20 2051  . hydrALAZINE (APRESOLINE) injection 10 mg  10 mg Intravenous Q4H PRN Rise Patience, MD      . HYDROmorphone (DILAUDID) injection 0.5 mg  0.5 mg Intravenous Q4H PRN Wendee Beavers T, MD   0.5 mg at 01/03/20 1220  . lidocaine (LIDODERM) 5 % 2 patch  2 patch Transdermal Q24H Rosezella Rumpf, NP   2 patch at 01/03/20 0510  . ondansetron (ZOFRAN) tablet 4 mg  4 mg Oral Q6H PRN Rise Patience, MD       Or  . ondansetron [facility]) injection 4 mg  4 mg Intravenous Q6H PRN Rise Patience, MD      . oxyCODONE (Oxy IR/ROXICODONE) immediate release tablet 5 mg  5 mg Oral Q8H PRN Mercy Riding, MD   5 mg at 01/03/20 0508  . polyethylene glycol (MIRALAX / GLYCOLAX) packet 17 g  17 g Oral Daily Gonfa, Taye T, MD   17 g at 01/03/20 1011  . senna-docusate (Senokot-S) tablet 2 tablet  2 tablet Oral BID Rosezella Rumpf, NP   2 tablet at 01/03/20 4103     Discharge Medications: Please see discharge summary for a list of discharge medications.  Relevant Imaging Results:  Relevant Lab Results:   Additional Information SSN [id number]  Curlene Labrum, RN

## 2020-01-03 NOTE — Evaluation (Signed)
Occupational Therapy Evaluation Patient Details Name: [name] MRN: [id number] DOB: [birth year] Today's Date: 01/03/2020    History of Present Illness 86 year old female with history of HTN, HLD, R THA, polymyalgia rheumatica, and recent T12 compression fracture after fall presenting with acute on chronic pain.   Clinical Impression   Prior to hospital admission, patient was living alone with PRN assist from [place] Mon/Wed/Fri and Thur afternoon for shower transfers and IADLs including cooking, light housekeeping and grocery shopping. Patient currently presents below baseline level of function requiring Min A at best and Mod-Max A at most for functional transfers, bed mobility and dressing, bathing, toileting tasks depending on level of pain in low back. Patient met this date shortly after medication administration requiring less assistance with functional tasks than during initial PT evaluation this a.m. Patient would benefit from continued acute OT services to increase safety and independence with self-care tasks. Patient would also benefit from short-term SNF rehab to maximize independence with BADLs, functional transfers, and mobility in prep for safe d/c to prior level of living/function.     Follow Up Recommendations  SNF    Equipment Recommendations  Other (comment) (Defer to next level of care)    Recommendations for Other Services       Precautions / Restrictions Precautions Precautions: Fall Required Braces or Orthoses: Spinal Brace Spinal Brace: Thoracolumbosacral orthotic Restrictions Weight Bearing Restrictions: No      Mobility Bed Mobility Overal bed mobility: Needs Assistance Bed Mobility: Rolling;Sidelying to Sit;Sit to Supine Rolling: Mod assist Sidelying to sit: Mod assist   Sit to supine: Min assist   General bed mobility comments: vc's for bridging knees, mod A to roll L. Mod A for elevation of trunk into sitting with HOB elevated. Min A for return to supine  at BLE.   Transfers Overall transfer level: Needs assistance Equipment used: None Transfers: Sit to/from Stand Sit to Stand: Mod assist         General transfer comment: Mod A for sit to stand from elevated EOB with use of BUE on bed surface.     Balance Overall balance assessment: Needs assistance Sitting-balance support: Bilateral upper extremity supported;Feet supported Sitting balance-Leahy Scale: Fair Sitting balance - Comments: heavily reliant on UE support due to pain   Standing balance support: Bilateral upper extremity supported Standing balance-Leahy Scale: Fair Standing balance comment: heavily reliant on UE support due to pain                           ADL either performed or assessed with clinical judgement   ADL Overall ADL's : Needs assistance/impaired     Grooming: Set up;Sitting   Upper Body Bathing: Minimal assistance   Lower Body Bathing: Moderate assistance   Upper Body Dressing : Minimal assistance   Lower Body Dressing: Moderate assistance;Cueing for compensatory techniques;Cueing for back precautions;Sit to/from stand   Toilet Transfer: Moderate assistance Toilet Transfer Details (indicate cue type and reason): To BSC with use of RW and Min A. Cues for sequencing and hand placement.  Toileting- Water quality scientist and Hygiene: Sit to/from stand;Moderate assistance       Functional mobility during ADLs: Rolling walker;Moderate assistance       Vision   Vision Assessment?: Yes;No apparent visual deficits Eye Alignment: Within Functional Limits     Perception     Praxis      Pertinent Vitals/Pain Pain Assessment: Faces Faces Pain Scale: Hurts whole lot Pain Location: back  and LE's Pain Descriptors / Indicators: Shooting;Grimacing Pain Intervention(s): Limited activity within patient's tolerance;Premedicated before session     Hand Dominance Right   Extremity/Trunk Assessment Upper Extremity Assessment Upper  Extremity Assessment: Generalized weakness   Lower Extremity Assessment Lower Extremity Assessment: Generalized weakness   Cervical / Trunk Assessment Cervical / Trunk Assessment: Kyphotic   Communication Communication Communication: HOH   Cognition Arousal/Alertness: Awake/alert Behavior During Therapy: WFL for tasks assessed/performed Overall Cognitive Status: Within Functional Limits for tasks assessed                                     General Comments  Patient refused TLSO    Exercises     Shoulder Instructions      Home Living Family/patient expects to be discharged to:: Private residence Living Arrangements: Alone Available Help at Discharge: Family;Personal care attendant Type of Home: House Home Access: Stairs to enter [employer] of Steps: 3 Entrance Stairs-Rails: Right;Left;Can reach both Home Layout: Able to live on main level with bedroom/bathroom     Bathroom Shower/Tub: Occupational psychologist: Handicapped height Bathroom Accessibility: Yes How Accessible: Accessible via walker Home Equipment: [place] - 2 wheels;Cane - single point;Bedside commode;Grab bars - tub/shower   Additional Comments: pt lives alone but daughter works from her home during the day and is available if needed and pt has care attendant 3 days/wk for 3 hrs and someone who shops for her on Thurs. Has been at SNF since last admission      Prior Functioning/Environment Level of Independence: Needs assistance  Gait / Transfers Assistance Needed: was ambulating without RW prior to last admission. RW at night to go to the bathroom. Can in community to car and mailbox.  ADL's / Homemaking Assistance Needed: [place] assists with             OT Problem List:        OT Treatment/Interventions: Self-care/ADL training;Therapeutic exercise;Energy conservation;DME and/or AE instruction;Therapeutic activities;Patient/family education;Balance training     OT Goals(Current goals can be found in the care plan section) Acute Rehab OT Goals Patient Stated Goal: To return home OT Goal Formulation: With patient Time For Goal Achievement: 01/17/20 Potential to Achieve Goals: Good ADL Goals Pt Will Perform Lower Body Dressing: with modified independence;with adaptive equipment;sit to/from stand Pt Will Transfer to Toilet: with modified independence;ambulating;bedside commode;grab bars Pt Will Perform Toileting - Clothing Manipulation and hygiene: with modified independence;sit to/from stand Pt/caregiver will Perform Home Exercise Program: Increased strength;Both right and left upper extremity;Independently Additional ADL Goal #1: Patient will recall 3/3 back precautions independently in prep for completion of BADLs.  OT Frequency: Min 2X/week   Barriers to D/C: Decreased caregiver support          Co-evaluation              AM-PAC OT "6 Clicks" Daily Activity     Outcome Measure Help from another person eating meals?: None Help from another person taking care of personal grooming?: A Little Help from another person toileting, which includes using toliet, bedpan, or urinal?: A Lot Help from another person bathing (including washing, rinsing, drying)?: A Lot Help from another person to put on and taking off regular upper body clothing?: A Little Help from another person to put on and taking off regular lower body clothing?: A Lot 6 Click Score: 16   End of Session Equipment Utilized During Treatment:  Gait belt;Rolling walker  Activity Tolerance: Patient limited by pain Patient left: in bed;with call bell/phone within reach;with bed alarm set  OT Visit Diagnosis: Unsteadiness on feet (R26.81);Repeated falls (R29.6);Muscle weakness (generalized) (M62.81);Pain                Time: 1340-1421 OT Time Calculation (min): 41 min Charges:  OT General Charges $OT Visit: 1 Visit OT Evaluation $OT Eval Moderate Complexity: 1 Mod OT  Treatments $Self Care/Home Management : 8-22 mins  Othar Curto H. OTR/L Supplemental OT, Department of rehab services [phone]  Evangelyne Loja R H. 01/03/2020, 2:34 PM

## 2020-01-03 NOTE — Progress Notes (Signed)
PROGRESS NOTE  [name] GUY:[id number] DOB: 06-18-1923   PCP: Harrison Mons, PA  Patient is from: Home  DOA: 12/31/2019 LOS: 2  Brief Narrative / Interim history: 86 year old female with history of HTN, HLD and recent T12 compression fracture after fall presenting with acute on chronic pain.  Reportedly, her pain gotten worse in the last 24 hours that prompted her to come to ED.   Of note, patient has had 4 prescriptions of Percocet (#90 on 6/9, #30 on 6/4, #28 on 5/24 and #18 on 5/21 on discharge from hospital).   In ED, she received 2 doses of IV Dilaudid 1 mg 30 minutes apart without adequate pain control which prompted admission for pain control.  On admission, patient was somewhat lethargic.   The next day, patient continued to endorse significant bilateral calf pain without back pain.  CT lumbar spine ordered and revealed known comminuted T12 body fracture with progressive height loss, retropulsion and related advanced spinal stenosis without signs of interval healing.  Neurosurgery, Dr. Marcello Moores consulted and recommended MRI of thoracic spine and lumbar spine, and Dr. [name] to see patient the next day.  MRI obtained later in the day showed severe T12 compression fracture with osteonecrosis and retropulsion resulting in moderate mass-effect on the conus medullaris without conus signal abnormality, mild marrow edema at the inferior T11 body without evidence of impending collapse and superimposed degenerative thoracic disc herniation at T6-T7 resulting in mild spinal stenosis and cord mass-effect without cord signal abnormality.   Evaluated by neurosurgery, Dr. Marcello Moores on 6/13 who recommended conservative care with aggressive pain control given risk of surgery and kyphoplasty.  Pain is slowly improving.  Therapy recommended SNF.   Subjective: Seen and examined earlier this morning.  No major events overnight of this morning.  Reports bilateral calf pain mainly with ambulation.  She rates  her pain 0 out of 10 at rest but gets to 8/10 with minimal leg movements.  Described the pain as spasming.  She has not had a bowel movement yet.  Denies urinary symptoms.  Objective: Vitals:   01/02/20 1420 01/02/20 1946 01/03/20 0309 01/03/20 1344  BP: 123/74 131/79 132/70 (!) 153/70  Pulse: 66 66 61 66  Resp: 16 16 15 16   Temp: 98 F (36.7 C) 98 F (36.7 C) 98.1 F (36.7 C) 98 F (36.7 C)  TempSrc: Oral Oral Axillary Oral  SpO2: 98% 98% 94% 98%  Weight:      Height:        Intake/Output Summary (Last 24 hours) at 01/03/2020 1610 Last data filed at 01/03/2020 1500 Gross per 24 hour  Intake 240 ml  Output 1700 ml  Net -1460 ml   Filed Weights   12/31/19 0018  Weight: 70.3 kg    Examination:  GENERAL: No apparent distress.  Nontoxic. HEENT: MMM.  Vision and hearing grossly intact.  NECK: Supple.  No apparent JVD.  RESP:  No IWOB.  Fair aeration bilaterally. CVS:  RRR. Heart sounds normal.  ABD/GI/GU: BS+. Abd soft, NTND.  MSK/EXT:  Moves extremities. No apparent deformity. No edema.  SKIN: no apparent skin lesion or wound NEURO: Awake, alert and oriented appropriately.  4/5 motor strength in BLE.  Limited by back pain. PSYCH: Calm. Normal affect.   Procedures:  None  Microbiology summarized: COVID-19 PCR negative.  Assessment & Plan: Progressive T12 compression fracture with retropulsion and spinal stenosis Degenerative thoracic disc herniation at T6-T7 resulting in mild spinal stenosis and cord mass-effect Bilateral lower extremity pain-suspect  claudication due to the above. Chronic back pain-likely due to the above -No cauda equina signs.  About 4/5 BLE strength on exam.  Hoffmann sign is negative. -No plan for surgical intervention or kyphoplasty per neurosurgery. -TLSO brace when up and out of bed. -Continue Dilaudid to 0.5 mg every 4 hours as needed for severe pain and wean as able. -Percocet 5 mg every 6 hours as needed for moderate pain -Continue  scheduled Tylenol 1 g every 8 hours and Flexeril 5 mg every 8 hours as needed -Continue gabapentin to 300 mg after breakfast and lunch, and 600 mg at night -Continue IV Decadron 4 mg every 8 hours as recommended by Dr. Marcello Moores -Subcu Lovenox for VTE prophylaxis  Essential hypertension: H normotensive for most part. -Continue amlodipine 10 mg daily -Pain control as above -As needed hydralazine  Normocytic anemia: Stable -Check anemia panel and monitor.  Hypomagnesemia: Resolved. -Monitor and replenish as appropriate.  Hyperlipidemia -Continue home statin  Goal of care/DNR/DNI-appropriate -Appreciate palliative care input.           DVT prophylaxis:  enoxaparin (LOVENOX) injection 40 mg Start: 01/02/20 1545 Place and maintain sequential compression device Start: 12/31/19 1444  Code Status: DNR/DNI Family Communication: Updated patient's daughter and son over the phone. Status is: Inpatient  Discharge barrier: Ongoing active pain requiring inpatient pain management and IV treatments appropriate due to intensity of illness or inability to take PO   Dispo: The patient is from: Home              Anticipated d/c is to: SNF              Anticipated d/c date is: 2 days              Patient currently is not medically stable to d/c.       Consultants:  Neurosurgery Palliative medicine  Sch Meds:  Scheduled Meds: . acetaminophen  1,000 mg Oral Q8H  . amLODipine  10 mg Oral Daily  . calcitonin (salmon)  1 spray Alternating Nares q1800  . dexamethasone (DECADRON) injection  4 mg Intravenous Q8H  . DULoxetine  30 mg Oral Daily  . enoxaparin (LOVENOX) injection  40 mg Subcutaneous Q24H  . gabapentin  300 mg Oral BID WC   And  . gabapentin  600 mg Oral QHS  . lidocaine  2 patch Transdermal Q24H  . polyethylene glycol  17 g Oral Daily  . senna-docusate  2 tablet Oral BID   Continuous Infusions:  PRN Meds:.cyclobenzaprine, hydrALAZINE, HYDROmorphone (DILAUDID)  injection, ondansetron **OR** ondansetron (ZOFRAN) IV, oxyCODONE  Antimicrobials: Anti-infectives (From admission, onward)   None       I have personally reviewed the following labs and images: CBC: Recent Labs  Lab 12/31/19 0120 12/31/19 0613 01/01/20 0929 01/02/20 0301 01/02/20 1649  WBC 7.5 5.9 6.4  --  8.9  NEUTROABS 4.5 3.0  --   --   --   HGB 11.5* 9.9* 9.9* 10.9* 10.5*  HCT 37.0 32.6* 32.3* 34.5* 32.7*  MCV 95.1 96.4 96.7  --  91.9  PLT 277 264 221  --  229   BMP &GFR Recent Labs  Lab 12/31/19 0120 12/31/19 0613 01/01/20 0929 01/02/20 0301 01/02/20 1649  NA 134* 136 137  --   --   K 3.9 3.9 4.3  --   --   CL 96* 100 102  --   --   CO2 27 28 28   --   --   GLUCOSE  109* 108* 108*  --   --   BUN 23 21 17   --   --   CREATININE 0.97 1.04* 0.82  --  0.61  CALCIUM 9.5 9.2 9.0  --   --   MG  --   --  1.5* 1.8  --   PHOS  --   --  4.0  --   --    Estimated Creatinine Clearance: 40.9 mL/min (by C-G formula based on SCr of 0.61 mg/dL). Liver & Pancreas: Recent Labs  Lab 12/31/19 0613 01/01/20 0929  AST 18  --   ALT 14  --   ALKPHOS 97  --   BILITOT 0.4  --   PROT 5.8*  --   ALBUMIN 3.0* 2.9*   No results for input(s): LIPASE, AMYLASE in the last 168 hours. No results for input(s): AMMONIA in the last 168 hours. Diabetic: No results for input(s): HGBA1C in the last 72 hours. Recent Labs  Lab 01/02/20 1140 01/02/20 1716 01/03/20 0549 01/03/20 0731 01/03/20 1143  GLUCAP 119* 120* 121* 117* 128*   Cardiac Enzymes: No results for input(s): CKTOTAL, CKMB, CKMBINDEX, TROPONINI in the last 168 hours. No results for input(s): PROBNP in the last 8760 hours. Coagulation Profile: No results for input(s): INR, PROTIME in the last 168 hours. Thyroid Function Tests: No results for input(s): TSH, T4TOTAL, FREET4, T3FREE, THYROIDAB in the last 72 hours. Lipid Profile: No results for input(s): CHOL, HDL, LDLCALC, TRIG, CHOLHDL, LDLDIRECT in the last 72  hours. Anemia Panel: No results for input(s): VITAMINB12, FOLATE, FERRITIN, TIBC, IRON, RETICCTPCT in the last 72 hours. Urine analysis:    Component Value Date/Time   COLORURINE YELLOW 02/14/2016 1844   APPEARANCEUR CLEAR 02/14/2016 1844   LABSPEC 1.013 02/14/2016 1844   PHURINE 5.5 02/14/2016 1844   GLUCOSEU NEGATIVE 02/14/2016 1844   HGBUR TRACE (A) 02/14/2016 1844   BILIRUBINUR NEGATIVE 02/14/2016 1844   BILIRUBINUR n 12/06/2010 0000   KETONESUR NEGATIVE 02/14/2016 1844   PROTEINUR NEGATIVE 02/14/2016 1844   UROBILINOGEN 0.2 12/19/2011 1104   NITRITE NEGATIVE 02/14/2016 1844   LEUKOCYTESUR NEGATIVE 02/14/2016 1844   Sepsis Labs: Invalid input(s): PROCALCITONIN, [place]  Microbiology: Recent Results (from the past 240 hour(s))  SARS Coronavirus 2 by RT PCR (hospital order, performed in [facility] hospital lab) Nasopharyngeal Nasopharyngeal Swab     Status: None   Collection Time: 12/31/19  2:17 AM   Specimen: Nasopharyngeal Swab  Result Value Ref Range Status   SARS Coronavirus 2 NEGATIVE NEGATIVE Final    Comment: (NOTE) SARS-CoV-2 target nucleic acids are NOT DETECTED.  The SARS-CoV-2 RNA is generally detectable in upper and lower respiratory specimens during the acute phase of infection. The lowest concentration of SARS-CoV-2 viral copies this assay can detect is 250 copies / mL. A negative result does not preclude SARS-CoV-2 infection and should not be used as the sole basis for treatment or other patient management decisions.  A negative result may occur with improper specimen collection / handling, submission of specimen other than nasopharyngeal swab, presence of viral mutation(s) within the areas targeted by this assay, and inadequate number of viral copies (<250 copies / mL). A negative result must be combined with clinical observations, patient history, and epidemiological information.  Fact Sheet for Patients:    StrictlyIdeas.no  Fact Sheet for Healthcare Providers: BankingDealers.co.za  This test is not yet approved or  cleared by the [place] FDA and has been authorized for detection and/or diagnosis of SARS-CoV-2 by FDA under  an Emergency Use Authorization (EUA).  This EUA will remain in effect (meaning this test can be used) for the duration of the COVID-19 declaration under Section 564(b)(1) of the Act, 21 U.S.C. section 360bbb-3(b)(1), unless the authorization is terminated or revoked sooner.  Performed at [facility], [address], [place], [place] [address]     Radiology Studies: No results found.    Sharvil Hoey T. Conway  If 7PM-7AM, please contact night-coverage [email or web address] Password [facility] 01/03/2020, 4:10 PM

## 2020-01-03 NOTE — TOC Initial Note (Signed)
Transition of Care [facility]) - Initial/Assessment Note    Patient Details  Name: [name] MRN: [id number] Date of Birth: 01-Apr-1923  [facility] CM/SW Contact:    Curlene Labrum, RN Phone Number: 01/03/2020, 4:25 PM  Clinical Narrative:                 Case management met with the patient at the bedside regarding transition to [facility] placement.  Patient currently lives at home alone with a nursing assistant that visits a few times a week for assistance and care.  The patient's daughter lives in [place] as well.  The patient is a S/P fall and T12 compression Fx that will require SNF placement for mobility and safety at home after Rehab.  Will continue to follow.  The patient was given Medicare choice regarding SNF placement and will meet with her tomorrow for choice.  Expected Discharge Plan: Skilled Nursing Facility Barriers to Discharge: Insurance Authorization   Patient Goals and CMS Choice Patient states their goals for this hospitalization and ongoing recovery are:: The patient would like SNF placement. CMS Medicare.gov Compare Post Acute Care list provided to:: Patient Choice offered to / list presented to : Patient  Expected Discharge Plan and Services Expected Discharge Plan: [place]   Discharge Planning Services: CM Consult Post Acute Care Choice: [place] Living arrangements for the past 2 months: Single Family Home                                      Prior Living Arrangements/Services Living arrangements for the past 2 months: Single Family Home Lives with:: Self (aide visits with patient few times per week) Patient language and need for interpreter reviewed:: Yes Do you feel safe going back to the place where you live?: No   Patient would like [place] placement.  Need for Family Participation in Patient Care: Yes (Comment) Care giver support system in place?: Yes (comment) Current home services: Homehealth  aide Criminal Activity/Legal Involvement Pertinent to Current Situation/Hospitalization: No - Comment as needed  Activities of Daily Living      Permission Sought/Granted Permission sought to share information with : Case Manager Permission granted to share information with : Yes, Verbal Permission Granted     Permission granted to share info w AGENCY: SNF facilities  Permission granted to share info w Relationship: family     Emotional Assessment Appearance:: Appears stated age Attitude/Demeanor/Rapport: Engaged Affect (typically observed): Accepting Orientation: : Oriented to Self, Oriented to Place, Oriented to  Time, Oriented to Situation Alcohol / Substance Use: Not Applicable Psych Involvement: No (comment)  Admission diagnosis:  Intractable pain [R52] Intractable low back pain [M54.5] Compression fracture of T12 vertebra, initial encounter ([place]) [S22.080A] Low back pain [M54.5] T12 compression fracture ([place]) [S22.080A] Patient Active Problem List   Diagnosis Date Noted  . T12 compression fracture ([place]) 01/01/2020  . Intractable pain 12/31/2019  . Low back pain 12/31/2019  . Palliative care by specialist   . Goals of care, counseling/discussion   . DNR (do not resuscitate)   . Compression fracture of T12 vertebra ([place]) 12/04/2019  . Spinal fracture of T12 vertebra ([place]) 12/04/2019  . Fever, unspecified 02/15/2016  . Anxiety 02/15/2016  . Anemia 02/15/2016  . Hyponatremia 02/15/2016  . Bradycardia   . Constipation   . Depression   . MDD (major depressive disorder), recurrent severe, without psychosis ([place])   .  Symptomatic bradycardia 01/30/2016  . Symptomatic anemia 01/29/2016  . Hypertension   . Osteoporosis   . Hyperlipidemia   . GERD (gastroesophageal reflux disease)   . Spinal stenosis   . Hearing loss 12/08/2011  . Osteoarthritis of hip 12/02/2011  . Endometriosis   . DIVERTICULOSIS-COLON 02/12/2009  . ALLERGIC RHINITIS 01/19/2007  . ESOPHAGEAL  STRICTURE 01/19/2007   PCP:  Harrison Mons, [place] Pharmacy:   [id number] [place], [id number]: [phone]: [phone]    Social Determinants of Health (SDOH) Interventions    Readmission Risk Interventions Readmission Risk Prevention Plan 01/03/2020  Post Dischage Appt Complete  Medication Screening Complete  Transportation Screening Complete  Some recent data might be hidden

## 2020-01-03 NOTE — Progress Notes (Signed)
The chaplain was present bedside for F/U spiritual care. The chaplain understands the Pt. is waiting for her daughter and friend to visit any time.  The chaplain remains available for F/U spiritual care as needed.

## 2020-01-03 NOTE — Evaluation (Signed)
Physical Therapy Evaluation Patient Details Name: [name] MRN: [id number] DOB: November 08, 1922 Today's Date: 01/03/2020   History of Present Illness  86 year old female with history of HTN, HLD, R THA, polymyalgia rheumatica, and recent T12 compression fracture after fall presenting with acute on chronic pain.  Clinical Impression  Pt admitted with above diagnosis. Pt log rolled to L and sat up with max A. Brace donned in sitting, pt reported more pain with it than without. Pt required max A to stand EOB and take side steps to [facility]. Standing caused pain down BLE's. Pt unable to tolerate further ambulation. Pt assisted back to supine with max A. Currently recommending pt return to SNF for rehab.  Pt currently with functional limitations due to the deficits listed below (see PT Problem List). Pt will benefit from skilled PT to increase their independence and safety with mobility to allow discharge to the venue listed below.       Follow Up Recommendations SNF;Supervision/Assistance - 24 hour    Equipment Recommendations  None recommended by PT    Recommendations for Other Services       Precautions / Restrictions Precautions Precautions: Fall Required Braces or Orthoses: Spinal Brace Spinal Brace: Thoracolumbosacral orthotic Restrictions Weight Bearing Restrictions: No      Mobility  Bed Mobility Overal bed mobility: Needs Assistance Bed Mobility: Rolling;Sidelying to Sit;Sit to Supine Rolling: Mod assist Sidelying to sit: Max assist   Sit to supine: Max assist   General bed mobility comments: vc's for bridging knees, mod A to roll L. Max A for elevation of trunk into sitting. Max A to return to supine due to pain  Transfers Overall transfer level: Needs assistance Equipment used: None Transfers: Sit to/from Stand Sit to Stand: Max assist         General transfer comment: max A for power up to therapist, increased pain BLE's  Ambulation/Gait Ambulation/Gait assistance:  Max assist Gait Distance (Feet): 2 Feet Assistive device: None Gait Pattern/deviations: Step-to pattern Gait velocity: decreased Gait velocity interpretation: <1.31 ft/sec, indicative of household ambulator General Gait Details: side stepped from foot of bed to head to L with max A for support. Pt was wearing brace  Stairs            Wheelchair Mobility    Modified Rankin (Stroke Patients Only)       Balance Overall balance assessment: Needs assistance Sitting-balance support: Bilateral upper extremity supported;Feet supported Sitting balance-Leahy Scale: Poor Sitting balance - Comments: heavily reliant on UE support due to pain   Standing balance support: Bilateral upper extremity supported Standing balance-Leahy Scale: Poor Standing balance comment: heavily reliant on UE support due to pain                             Pertinent Vitals/Pain Pain Assessment: Faces Faces Pain Scale: Hurts whole lot Pain Location: back and LE's Pain Descriptors / Indicators: Shooting;Grimacing Pain Intervention(s): Limited activity within patient's tolerance;Monitored during session    Home Living Family/patient expects to be discharged to:: Private residence Living Arrangements: Alone Available Help at Discharge: Family;Personal care attendant Type of Home: House Home Access: Stairs to enter Entrance Stairs-Rails: Right;Left;Can reach both Entrance Stairs-Number of Steps: 3 Home Layout: Able to live on main level with bedroom/bathroom Home Equipment: Walker - 2 wheels;Cane - single point;Bedside commode Additional Comments: pt lives alone but daughter works from her home during the day and is available if needed and pt has care  attendant 3 days/wk for 2 hrs and someone who shops for her on Thurs. Has been at SNF since last admission    Prior Function Level of Independence: Needs assistance   Gait / Transfers Assistance Needed: was ambulating without RW prior to last  admission  ADL's / Homemaking Assistance Needed: HHaide assists with bathing        Hand Dominance        Extremity/Trunk Assessment   Upper Extremity Assessment Upper Extremity Assessment: Generalized weakness    Lower Extremity Assessment Lower Extremity Assessment: Generalized weakness    Cervical / Trunk Assessment Cervical / Trunk Assessment: Kyphotic  Communication   Communication: HOH  Cognition Arousal/Alertness: Awake/alert Behavior During Therapy: WFL for tasks assessed/performed Overall Cognitive Status: Within Functional Limits for tasks assessed                                        General Comments General comments (skin integrity, edema, etc.): brace donned with pt in sitting. She reported increased pain with brace on.     Exercises     Assessment/Plan    PT Assessment Patient needs continued PT services  PT Problem List Decreased strength;Decreased activity tolerance;Decreased balance;Decreased mobility;Decreased knowledge of use of DME;Decreased knowledge of precautions;Pain;Impaired sensation       PT Treatment Interventions DME instruction;Gait training;Functional mobility training;Therapeutic activities;Therapeutic exercise;Balance training;Patient/family education    PT Goals (Current goals can be found in the Care Plan section)  Acute Rehab PT Goals Patient Stated Goal: decreased pain, be able to walk PT Goal Formulation: With patient Time For Goal Achievement: 01/17/20 Potential to Achieve Goals: Fair    Frequency Min 3X/week   Barriers to discharge Decreased caregiver support      Co-evaluation               AM-PAC PT "6 Clicks" Mobility  Outcome Measure Help needed turning from your back to your side while in a flat bed without using bedrails?: A Lot Help needed moving from lying on your back to sitting on the side of a flat bed without using bedrails?: A Lot Help needed moving to and from a bed to a  chair (including a wheelchair)?: A Lot Help needed standing up from a chair using your arms (e.g., wheelchair or bedside chair)?: A Lot Help needed to walk in hospital room?: Total Help needed climbing 3-5 steps with a railing? : Total 6 Click Score: 10    End of Session Equipment Utilized During Treatment: Back brace Activity Tolerance: Patient limited by pain Patient left: in bed;with call bell/phone within reach;with bed alarm set Nurse Communication: Mobility status PT Visit Diagnosis: Unsteadiness on feet (R26.81);Muscle weakness (generalized) (M62.81);Difficulty in walking, not elsewhere classified (R26.2);Pain Pain - Right/Left:  (both) Pain - part of body: Leg (back)    Time: 5701-7793 PT Time Calculation (min) (ACUTE ONLY): 31 min   Charges:   PT Evaluation $PT Eval Moderate Complexity: 1 Mod PT Treatments $Therapeutic Activity: 8-22 mins        Leighton Roach, [place]  Pager [phone] Office [place] 01/03/2020, 1:36 PM

## 2020-01-04 ENCOUNTER — Ambulatory Visit: Payer: Medicare HMO | Admitting: Psychology

## 2020-01-04 LAB — RENAL FUNCTION PANEL
Albumin: 3.3 g/dL — ABNORMAL LOW (ref 3.5–5.0)
Anion gap: 10 (ref 5–15)
BUN: 17 mg/dL (ref 8–23)
CO2: 30 mmol/L (ref 22–32)
Calcium: 9.3 mg/dL (ref 8.9–10.3)
Chloride: 93 mmol/L — ABNORMAL LOW (ref 98–111)
Creatinine, Ser: 0.64 mg/dL (ref 0.44–1.00)
GFR calc Af Amer: >60 mL/min (ref >60)
GFR calc non Af Amer: >60 mL/min (ref >60)
Glucose, Bld: 118 mg/dL — ABNORMAL HIGH (ref 70–99)
Phosphorus: 3.1 mg/dL (ref 2.5–4.6)
Potassium: 3.9 mmol/L (ref 3.5–5.1)
Sodium: 133 mmol/L — ABNORMAL LOW (ref 135–145)

## 2020-01-04 LAB — GLUCOSE, CAPILLARY
Glucose-Capillary: 117 mg/dL — ABNORMAL HIGH (ref 70–99)
Glucose-Capillary: 121 mg/dL — ABNORMAL HIGH (ref 70–99)
Glucose-Capillary: 130 mg/dL — ABNORMAL HIGH (ref 70–99)
Glucose-Capillary: 168 mg/dL — ABNORMAL HIGH (ref 70–99)

## 2020-01-04 LAB — HEMOGLOBIN AND HEMATOCRIT, BLOOD
HCT: 37 % (ref 36.0–46.0)
Hemoglobin: 12.1 g/dL (ref 12.0–15.0)

## 2020-01-04 LAB — MAGNESIUM: Magnesium: 1.7 mg/dL (ref 1.7–2.4)

## 2020-01-04 MED ORDER — POLYETHYLENE GLYCOL 3350 17 G PO PACK
17.0000 g | PACK | Freq: Two times a day (BID) | ORAL | Status: DC
  Administered 2020-01-04 – 2020-01-05 (×3): 17 g via ORAL
  Filled 2020-01-04 (×4): qty 1

## 2020-01-04 MED ORDER — OXYCODONE HCL 5 MG PO TABS
5.0000 mg | ORAL_TABLET | Freq: Four times a day (QID) | ORAL | Status: DC | PRN
  Administered 2020-01-05: 5 mg via ORAL
  Filled 2020-01-04: qty 1

## 2020-01-04 MED ORDER — OXYCODONE HCL ER 10 MG PO T12A
10.0000 mg | EXTENDED_RELEASE_TABLET | Freq: Two times a day (BID) | ORAL | Status: DC
  Administered 2020-01-04 – 2020-01-06 (×5): 10 mg via ORAL
  Filled 2020-01-04 (×5): qty 1

## 2020-01-04 MED ORDER — DEXAMETHASONE 4 MG PO TABS
4.0000 mg | ORAL_TABLET | Freq: Two times a day (BID) | ORAL | Status: DC
  Administered 2020-01-04 – 2020-01-05 (×3): 4 mg via ORAL
  Filled 2020-01-04 (×3): qty 1

## 2020-01-04 NOTE — Progress Notes (Addendum)
PROGRESS NOTE  [name] GDJ:[id number] DOB: 1922/11/28   PCP: Harrison Mons, PA  Patient is from: Home  DOA: 12/31/2019 LOS: 3  Brief Narrative / Interim history: 86 year old female with history of HTN, HLD and recent T12 compression fracture after fall presenting with acute on chronic pain.  Reportedly, her pain gotten worse in the last 24 hours that prompted her to come to ED.   Of note, patient has had 4 prescriptions of Percocet (#90 on 6/9, #30 on 6/4, #28 on 5/24 and #18 on 5/21 on discharge from hospital).   In ED, she received 2 doses of IV Dilaudid 1 mg 30 minutes apart without adequate pain control which prompted admission for pain control.  On admission, patient was somewhat lethargic.   The next day, patient continued to endorse significant bilateral calf pain without back pain.  CT lumbar spine ordered and revealed known comminuted T12 body fracture with progressive height loss, retropulsion and related advanced spinal stenosis without signs of interval healing.  Neurosurgery, Dr. Marcello Moores consulted and recommended MRI of thoracic spine and lumbar spine, and Dr. [name] to see patient the next day.  MRI obtained later in the day showed severe T12 compression fracture with osteonecrosis and retropulsion resulting in moderate mass-effect on the conus medullaris without conus signal abnormality, mild marrow edema at the inferior T11 body without evidence of impending collapse and superimposed degenerative thoracic disc herniation at T6-T7 resulting in mild spinal stenosis and cord mass-effect without cord signal abnormality.   Evaluated by neurosurgery, Dr. Marcello Moores on 6/13 who recommended conservative care with aggressive pain control given risk of surgery and kyphoplasty.  Pain is slowly improving.  Therapy recommended SNF.   Subjective: Seen and examined earlier this morning.  Reports significant bilateral calf pain after she spent some time on bedpan.  She thinks the bedpan has  provoked the pain.  Pain is like spasming.  It is intermittent.  She denies numbness or tingling.  She denies urinary retention.  She says she has not a bowel movement yet.  Objective: Vitals:   01/03/20 1940 01/04/20 0315 01/04/20 0805 01/04/20 1350  BP: 129/71 127/61 (!) 168/91 117/67  Pulse: 63 71 96 (!) 53  Resp: 18 17 16 17   Temp: 98.5 F (36.9 C) 98.7 F (37.1 C) (!) 97.3 F (36.3 C) 97.6 F (36.4 C)  TempSrc: Oral Oral Oral Oral  SpO2: 96% 98% 97% 94%  Weight:      Height:        Intake/Output Summary (Last 24 hours) at 01/04/2020 1439 Last data filed at 01/04/2020 0900 Gross per 24 hour  Intake 600 ml  Output 300 ml  Net 300 ml   Filed Weights   12/31/19 0018  Weight: 70.3 kg    Examination:  GENERAL: No apparent distress.  Nontoxic. HEENT: MMM.  Vision and hearing grossly intact.  NECK: Supple.  No apparent JVD.  RESP:  No IWOB.  Fair aeration bilaterally. CVS:  RRR.  2/6 SEM over RUSB and LUSB (chronic). ABD/GI/GU: BS+. Abd soft, NTND.  MSK/EXT:  Moves extremities. No apparent deformity. No edema.  SKIN: no apparent skin lesion or wound NEURO: Awake and oriented appropriately.  No apparent focal neuro deficit. PSYCH: Calm except when she is in pain due to leg spasm.  Procedures:  None  Microbiology summarized: COVID-19 PCR negative.  Assessment & Plan: Progressive T12 compression fracture with retropulsion and spinal stenosis Degenerative thoracic disc herniation at T6-T7 resulting in mild spinal stenosis and cord  mass-effect Bilateral lower extremity pain-suspect claudication due to the above. Chronic back pain-likely due to the above -No cauda equina signs.  About 4/5 BLE strength on exam.  Hoffmann sign is negative. -No plan for surgical intervention or kyphoplasty per neurosurgery. -TLSO brace when up and out of bed. -Discontinue IV Dilaudid.  Start OxyContin 10 mg every 12 hours -Percocet 5 mg every 6 hours as needed for severe and breakthrough  pain -Continue scheduled Tylenol 1 g every 8 hours and Flexeril 5 mg every 8 hours as needed -Continue gabapentin to 300 mg after breakfast and lunch, and 600 mg at night -Change  IV Decadron to p.o. -Subcu Lovenox for VTE prophylaxis -Continue PT/OT  Essential hypertension: H normotensive for most part. -Continue amlodipine 10 mg daily -Pain control as above -As needed hydralazine  Mild hyponatremia: She is euvolemic.  Due to Cymbalta? -Recheck in the morning  Normocytic anemia: Stable -Check anemia panel and monitor.  Hypomagnesemia: Resolved. -Monitor and replenish as appropriate.  Hyperlipidemia -Continue home statin  Constipation: Likely due to opiate. -Schedule Senokot-S and MiraLAX.  If no resolution with this, will try magnesium citrate then enema  Goal of care/DNR/DNI-appropriate -Appreciate palliative care input.           DVT prophylaxis:  enoxaparin (LOVENOX) injection 40 mg Start: 01/02/20 1545 Place and maintain sequential compression device Start: 12/31/19 1444  Code Status: DNR/DNI Family Communication: Updated patient's daughter and son over the phone on 6/15. Status is: Inpatient  Discharge barrier: Ongoing active pain requiring inpatient pain management and Unsafe d/c plan   Dispo:: Likely due to opiate.  Is from: Home              Anticipated d/c is to: SNF              Anticipated d/c date is: 1 day              Patient currently is not medically stable to d/c.  Pending reasonable pain control and SNF bed.       Consultants:  Neurosurgery Palliative medicine  Sch Meds:  Scheduled Meds: . acetaminophen  1,000 mg Oral Q8H  . amLODipine  10 mg Oral Daily  . calcitonin (salmon)  1 spray Alternating Nares q1800  . dexamethasone  4 mg Oral Q12H  . DULoxetine  30 mg Oral Daily  . enoxaparin (LOVENOX) injection  40 mg Subcutaneous Q24H  . gabapentin  300 mg Oral BID WC   And  . gabapentin  600 mg Oral QHS  . lidocaine  2 patch  Transdermal Q24H  . oxyCODONE  10 mg Oral Q12H  . polyethylene glycol  17 g Oral BID  . senna-docusate  2 tablet Oral BID   Continuous Infusions:  PRN Meds:.cyclobenzaprine, hydrALAZINE, ondansetron **OR** ondansetron (ZOFRAN) IV, oxyCODONE  Antimicrobials: Anti-infectives (From admission, onward)   None       I have personally reviewed the following labs and images: CBC: Recent Labs  Lab 12/31/19 0120 12/31/19 0120 12/31/19 1937 01/01/20 0929 01/02/20 0301 01/02/20 1649 01/04/20 0837  WBC 7.5  --  5.9 6.4  --  8.9  --   NEUTROABS 4.5  --  3.0  --   --   --   --   HGB 11.5*   < > 9.9* 9.9* 10.9* 10.5* 12.1  HCT 37.0   < > 32.6* 32.3* 34.5* 32.7* 37.0  MCV 95.1  --  96.4 96.7  --  91.9  --   PLT 277  --  264 221  --  229  --    < > = values in this interval not displayed.   BMP &GFR Recent Labs  Lab 12/31/19 0120 12/31/19 0613 01/01/20 0929 01/02/20 0301 01/02/20 1649 01/04/20 0837  NA 134* 136 137  --   --  133*  K 3.9 3.9 4.3  --   --  3.9  CL 96* 100 102  --   --  93*  CO2 27 28 28   --   --  30  GLUCOSE 109* 108* 108*  --   --  118*  BUN 23 21 17   --   --  17  CREATININE 0.97 1.04* 0.82  --  0.61 0.64  CALCIUM 9.5 9.2 9.0  --   --  9.3  MG  --   --  1.5* 1.8  --  1.7  PHOS  --   --  4.0  --   --  3.1   Estimated Creatinine Clearance: 40.9 mL/min (by C-G formula based on SCr of 0.64 mg/dL). Liver & Pancreas: Recent Labs  Lab 12/31/19 0613 01/01/20 0929 01/04/20 0837  AST 18  --   --   ALT 14  --   --   ALKPHOS 97  --   --   BILITOT 0.4  --   --   PROT 5.8*  --   --   ALBUMIN 3.0* 2.9* 3.3*   No results for input(s): LIPASE, AMYLASE in the last 168 hours. No results for input(s): AMMONIA in the last 168 hours. Diabetic: No results for input(s): HGBA1C in the last 72 hours. Recent Labs  Lab 01/03/20 1143 01/03/20 1659 01/03/20 2056 01/04/20 0806 01/04/20 1114  GLUCAP 128* 141* 167* 117* 121*   Cardiac Enzymes: No results for input(s):  CKTOTAL, CKMB, CKMBINDEX, TROPONINI in the last 168 hours. No results for input(s): PROBNP in the last 8760 hours. Coagulation Profile: No results for input(s): INR, PROTIME in the last 168 hours. Thyroid Function Tests: No results for input(s): TSH, T4TOTAL, FREET4, T3FREE, THYROIDAB in the last 72 hours. Lipid Profile: No results for input(s): CHOL, HDL, LDLCALC, TRIG, CHOLHDL, LDLDIRECT in the last 72 hours. Anemia Panel: No results for input(s): VITAMINB12, FOLATE, FERRITIN, TIBC, IRON, RETICCTPCT in the last 72 hours. Urine analysis:    Component Value Date/Time   COLORURINE YELLOW 02/14/2016 1844   APPEARANCEUR CLEAR 02/14/2016 1844   LABSPEC 1.013 02/14/2016 1844   PHURINE 5.5 02/14/2016 1844   GLUCOSEU NEGATIVE 02/14/2016 1844   HGBUR TRACE (A) 02/14/2016 1844   BILIRUBINUR NEGATIVE 02/14/2016 1844   BILIRUBINUR n 12/06/2010 0000   KETONESUR NEGATIVE 02/14/2016 1844   PROTEINUR NEGATIVE 02/14/2016 1844   UROBILINOGEN 0.2 12/19/2011 1104   NITRITE NEGATIVE 02/14/2016 1844   LEUKOCYTESUR NEGATIVE 02/14/2016 1844   Sepsis Labs: Invalid input(s): PROCALCITONIN, [place]  Microbiology: Recent Results (from the past 240 hour(s))  SARS Coronavirus 2 by RT PCR (hospital order, performed in [facility] hospital lab) Nasopharyngeal Nasopharyngeal Swab     Status: None   Collection Time: 12/31/19  2:17 AM   Specimen: Nasopharyngeal Swab  Result Value Ref Range Status   SARS Coronavirus 2 NEGATIVE NEGATIVE Final    Comment: (NOTE) SARS-CoV-2 target nucleic acids are NOT DETECTED.  The SARS-CoV-2 RNA is generally detectable in upper and lower respiratory specimens during the acute phase of infection. The lowest concentration of SARS-CoV-2 viral copies this assay can detect is 250 copies / mL. A negative result does not preclude SARS-CoV-2 infection  and should not be used as the sole basis for treatment or other patient management decisions.  A negative result may occur  with improper specimen collection / handling, submission of specimen other than nasopharyngeal swab, presence of viral mutation(s) within the areas targeted by this assay, and inadequate number of viral copies (<250 copies / mL). A negative result must be combined with clinical observations, patient history, and epidemiological information.  Fact Sheet for Patients:   StrictlyIdeas.no  Fact Sheet for Healthcare Providers: BankingDealers.co.za  This test is not yet approved or  cleared by the [place] FDA and has been authorized for detection and/or diagnosis of SARS-CoV-2 by FDA under an Emergency Use Authorization (EUA).  This EUA will remain in effect (meaning this test can be used) for the duration of the COVID-19 declaration under Section 564(b)(1) of the Act, 21 U.S.C. section 360bbb-3(b)(1), unless the authorization is terminated or revoked sooner.  Performed at [facility], [address], [place], [place] [address]     Radiology Studies: No results found.    Mariko Nowakowski T. [place]  If 7PM-7AM, please contact night-coverage [email or web address] Password [facility] 01/04/2020, 2:39 PM

## 2020-01-04 NOTE — Progress Notes (Addendum)
Physical Therapy Treatment Patient Details Name: [name] MRN: [id number] DOB: 09/20/1922 Today's Date: 01/04/2020    History of Present Illness 86 year old female with history of HTN, HLD, R THA, polymyalgia rheumatica, and recent T12 compression fracture after fall presenting with acute on chronic pain.    PT Comments    Pt making steady progress towards her physical therapy goals, as evidenced by requiring decreased level of assist with mobility and increased ambulation distance. Requiring up to min assist for functional mobility. Ambulating to bathroom and back with a walker and participating in ADL activity at sink. Back and BLE pain in addition to decreased activity tolerance remain limiting factors. Recommend short term post acute rehab to address deficits and maximize functional independence.     Follow Up Recommendations  SNF;Supervision/Assistance - 24 hour     Equipment Recommendations  None recommended by PT    Recommendations for Other Services       Precautions / Restrictions Precautions Precautions: Fall Required Braces or Orthoses: Spinal Brace Spinal Brace: Thoracolumbosacral orthotic Restrictions Weight Bearing Restrictions: No    Mobility  Bed Mobility Overal bed mobility: Needs Assistance Bed Mobility: Supine to Sit     Supine to sit: Min guard     General bed mobility comments: No physical assist required, use of bed rail and HOB elevated  Transfers Overall transfer level: Needs assistance Equipment used: Rolling walker (2 wheeled) Transfers: Sit to/from Stand Sit to Stand: Min assist         General transfer comment: Light minA to rise to standing  Ambulation/Gait Ambulation/Gait assistance: Min assist Gait Distance (Feet): 15 Feet Assistive device: Rolling walker (2 wheeled) Gait Pattern/deviations: Step-through pattern;Decreased stride length;Trunk flexed Gait velocity: decreased Gait velocity interpretation: <1.8 ft/sec, indicate  of risk for recurrent falls General Gait Details: MinA for stability, cues for environmental negototiation over thresholds, no overt LOB   Stairs             Wheelchair Mobility    Modified Rankin (Stroke Patients Only)       Balance Overall balance assessment: Needs assistance Sitting-balance support: Feet supported Sitting balance-Leahy Scale: Good     Standing balance support: Single extremity supported;During functional activity Standing balance-Leahy Scale: Fair Standing balance comment: brushing teeth at sink with min guard assist                            Cognition Arousal/Alertness: Awake/alert Behavior During Therapy: WFL for tasks assessed/performed Overall Cognitive Status: Within Functional Limits for tasks assessed                                 General Comments: STM deficits noted, but pt compensates well by writing down things she needs to recall      Exercises      General Comments        Pertinent Vitals/Pain Pain Assessment: Faces Faces Pain Scale: Hurts whole lot Pain Location: back, BLE's Pain Descriptors / Indicators: Shooting;Grimacing Pain Intervention(s): Limited activity within patient's tolerance;Monitored during session    Home Living                      Prior Function            PT Goals (current goals can now be found in the care plan section) Acute Rehab PT Goals Patient Stated Goal: less pain Potential  to Achieve Goals: Fair Progress towards PT goals: Progressing toward goals    Frequency    Min 3X/week      PT Plan Current plan remains appropriate    Co-evaluation              AM-PAC PT "6 Clicks" Mobility   Outcome Measure  Help needed turning from your back to your side while in a flat bed without using bedrails?: None Help needed moving from lying on your back to sitting on the side of a flat bed without using bedrails?: A Little Help needed moving to and  from a bed to a chair (including a wheelchair)?: A Little Help needed standing up from a chair using your arms (e.g., wheelchair or bedside chair)?: A Little Help needed to walk in hospital room?: A Little Help needed climbing 3-5 steps with a railing? : A Lot 6 Click Score: 18    End of Session Equipment Utilized During Treatment: Back brace;Gait belt Activity Tolerance: Patient tolerated treatment well Patient left: in chair;with call bell/phone within reach;with chair alarm set Nurse Communication: Mobility status PT Visit Diagnosis: Unsteadiness on feet (R26.81);Muscle weakness (generalized) (M62.81);Difficulty in walking, not elsewhere classified (R26.2);Pain Pain - part of body:  (back)     Time: 6578-4696 PT Time Calculation (min) (ACUTE ONLY): 29 min  Charges:  $Therapeutic Activity: 23-37 mins                       [name], PT, DPT Acute Rehabilitation Services Pager [phone] Office [phone]    [name] 01/04/2020, 2:12 PM

## 2020-01-05 LAB — SARS CORONAVIRUS 2 (TAT 6-24 HRS): SARS Coronavirus 2: NEGATIVE

## 2020-01-05 LAB — GLUCOSE, CAPILLARY
Glucose-Capillary: 122 mg/dL — ABNORMAL HIGH (ref 70–99)
Glucose-Capillary: 142 mg/dL — ABNORMAL HIGH (ref 70–99)

## 2020-01-05 MED ORDER — HYDROCHLOROTHIAZIDE 12.5 MG PO CAPS
12.5000 mg | ORAL_CAPSULE | Freq: Every day | ORAL | Status: DC
  Administered 2020-01-05 – 2020-01-06 (×2): 12.5 mg via ORAL
  Filled 2020-01-05 (×2): qty 1

## 2020-01-05 MED ORDER — MAGNESIUM CITRATE PO SOLN
1.0000 | Freq: Once | ORAL | Status: AC
  Administered 2020-01-05: 1 via ORAL
  Filled 2020-01-05: qty 296

## 2020-01-05 MED ORDER — FLEET ENEMA 7-19 GM/118ML RE ENEM
1.0000 | ENEMA | Freq: Once | RECTAL | Status: DC
  Filled 2020-01-05: qty 1

## 2020-01-05 MED ORDER — DEXAMETHASONE 4 MG PO TABS
4.0000 mg | ORAL_TABLET | Freq: Every day | ORAL | Status: DC
  Administered 2020-01-06: 4 mg via ORAL
  Filled 2020-01-05: qty 1

## 2020-01-05 NOTE — Evaluation (Signed)
Occupational Therapy Evaluation Patient Details Name: [name] MRN: [id number] DOB: March 22, 1923 Today's Date: 01/05/2020    History of Present Illness 86 year old female with history of HTN, HLD, R THA, polymyalgia rheumatica, and recent T12 compression fracture after fall presenting with acute on chronic pain.   Clinical Impression   Patient making progress toward goals demonstrating need for less assistance with functional transfers, mobility with RW and decreased pain with movement. Patient able to verbalize 3/3 back precautions independently this date. Patient able to ambulate to standard commode in bathroom with Min A and RW in prep for toilet transfer and  toileting/hygiene/clothing management requiring Mod A for thoroughness and cueing for adherence to back precautions. Education provided on use of AE for LB dressing with patient able to return demonstrate use of reacher and sock-aid to doff/don footwear. Patient continues to benefit from acute skilled OT services in prep for d/c to next level of care.     Follow Up Recommendations  SNF    Equipment Recommendations       Recommendations for Other Services       Precautions / Restrictions Precautions Precautions: Fall Required Braces or Orthoses: Spinal Brace Spinal Brace: Thoracolumbosacral orthotic Restrictions Weight Bearing Restrictions: No      Mobility Bed Mobility Overal bed mobility: Needs Assistance         Sit to supine: Min assist   General bed mobility comments: At BLE to assist from EOB to bed level 2/2 pain   Transfers Overall transfer level: Needs assistance Equipment used: Rolling walker (2 wheeled) Transfers: Sit to/from Stand Sit to Stand: Min assist         General transfer comment: Min A for sit to stand from recliner and from standard commode in bathroom    Balance Overall balance assessment: Needs assistance Sitting-balance support: Feet supported Sitting balance-Leahy Scale: Good      Standing balance support: Single extremity supported;During functional activity Standing balance-Leahy Scale: Fair Standing balance comment: Clothing/hygiene management                            ADL either performed or assessed with clinical judgement   ADL Overall ADL's : Needs assistance/impaired     Grooming: Set up;Sitting               Lower Body Dressing: Cueing for compensatory techniques;Cueing for back precautions;Sit to/from stand;Minimal assistance   Toilet Transfer: Minimal assistance;Ambulation Toilet Transfer Details (indicate cue type and reason): To standard commode in bathroom with R grab bar and RW Toileting- Clothing Manipulation and Hygiene: Moderate assistance;Sit to/from stand Toileting - Clothing Manipulation Details (indicate cue type and reason): For thoroughness with cueing for adherence to back precautions.      Functional mobility during ADLs: Rolling walker;Minimal assistance       Vision         Perception     Praxis      Pertinent Vitals/Pain Pain Assessment: 0-10 Pain Score: 5  Pain Location: Back shooting into BLE with spasms Pain Descriptors / Indicators: Shooting;Grimacing Pain Intervention(s): Limited activity within patient's tolerance;Monitored during session;Repositioned     Hand Dominance Right   Extremity/Trunk Assessment             Communication Communication Communication: HOH   Cognition Arousal/Alertness: Awake/alert Behavior During Therapy: WFL for tasks assessed/performed Overall Cognitive Status: Within Functional Limits for tasks assessed  General Comments  Patient in recliner upon entry    Exercises     Shoulder Instructions      Home Living                                          Prior Functioning/Environment                   OT Problem List:        OT Treatment/Interventions: Self-care/ADL  training;Therapeutic exercise;Energy conservation;DME and/or AE instruction;Therapeutic activities;Patient/family education;Balance training    OT Goals(Current goals can be found in the care plan section) Acute Rehab OT Goals Patient Stated Goal: To return home.   OT Frequency: Min 2X/week   Barriers to D/C:            Co-evaluation              AM-PAC OT "6 Clicks" Daily Activity     Outcome Measure Help from another person eating meals?: None Help from another person taking care of personal grooming?: A Little Help from another person toileting, which includes using toliet, bedpan, or urinal?: A Lot Help from another person bathing (including washing, rinsing, drying)?: A Lot Help from another person to put on and taking off regular upper body clothing?: A Little Help from another person to put on and taking off regular lower body clothing?: A Lot 6 Click Score: 16   End of Session Equipment Utilized During Treatment: Gait belt;Rolling walker  Activity Tolerance: Patient limited by pain Patient left: in bed;with call bell/phone within reach;with bed alarm set  OT Visit Diagnosis: Unsteadiness on feet (R26.81);Repeated falls (R29.6);Muscle weakness (generalized) (M62.81);Pain                Time: 6389-3734 OT Time Calculation (min): 35 min Charges:  OT General Charges $OT Visit: 1 Visit OT Treatments $Self Care/Home Management : 23-37 mins  Zade Falkner H. OTR/L Supplemental OT, Department of rehab services [phone]  Ramona Ruark R H. 01/05/2020, 3:07 PM

## 2020-01-05 NOTE — Progress Notes (Signed)
   Palliative Medicine Inpatient Follow Up Note   HPI: 86 year old female with history of HTN, HLD and recent T12 compression fracture after fall presenting with acute on chronic pain.  Reportedly, her pain gotten worse in the last 24 hours that prompted her to come to ED.   Palliative care was asked to get involved to help in goals of care conversations.  Today's Discussion (01/05/2020): Chart reviewed. I met with [name] at bedside. She was in much better spirits today as compared to the last time I saw her. She shared with me that her pain was improving. She stated that she is now trying to have a bowel movement. She vocalized the plan for skilled nursing at [facility] in the oncoming days.  Discussed the importance of continued conversation with family and their  medical providers regarding overall plan of care and treatment options, ensuring decisions are within the context of the patients values and GOCs.  Questions and concerns addressed   Objective: Gen:  Elderly F in NAD HEENT: Drymucous membranes CV: Regular rate and rhythm, no murmurs rubs or gallops PULM: clear to auscultation bilaterally  ABD: soft/nontender  EXT: No edema  Neuro: Alert and oriented x4  SUMMARY OF RECOMMENDATIONS DNAR/DNI  Continue current level of care  Continue PMT support  Dispo: Plan for placement at [facility]  Symptoms: -Lower Back Pain -T12 Fracture neuropathic pain:  Continue duloxetine 30 mg daily  Continue Gapapentin 3729m at breakfast and lunch and 60429mat dinner  Continue decadron 29m43mO BID    Continue flexeril 5mg39m TID PRN  Continue two lidoderm patches  Continue oxycontin 10mg19mBID  Continue oxycodone 5mg P629m6H PRN  Constipation:  - Miralx 17g PO BID  - Senokot S 2 Tabs PO BID  - Magnesium citrate  Time Spent: 35 Greater than 50% of the time was spent in counseling and coordination of  care ______________________________________________________________________________________ [place] Cell Phone: [phone] utilize secure chat with additional questions, if there is no response within 30 minutes please call the above phone number  Palliative Medicine Team providers are available by phone from 7am to 7pm daily and can be reached through the team cell phone.  Should this patient require assistance outside of these hours, please call the patient's attending physician.

## 2020-01-05 NOTE — TOC Transition Note (Signed)
Transition of Care [facility]) - CM/SW Discharge Note   Patient Details  Name: [name] MRN: [id number] Date of Birth: 08-02-1922  Transition of Care [facility]) CM/SW Contact:  Curlene Labrum, RN Phone Number: 01/05/2020, 11:54 AM   Clinical Narrative:    Case management spoke with both the patient and the daughter and they are considering placing the patient at [facility] for discharge.  [facility] called and updated with the information so that authorization for insurance is complete for the SNF facility.   Final next level of care: Skilled Nursing Facility Barriers to Discharge: Insurance Authorization   Patient Goals and CMS Choice Patient states their goals for this hospitalization and ongoing recovery are:: The patient would like SNF placement. CMS Medicare.gov Compare Post Acute Care list provided to:: Patient Choice offered to / list presented to : Patient  Discharge Placement                       Discharge Plan and Services   Discharge Planning Services: CM Consult Post Acute Care Choice: [place]                               Social Determinants of Health (SDOH) Interventions     Readmission Risk Interventions Readmission Risk Prevention Plan 01/03/2020  Post Dischage Appt Complete  Medication Screening Complete  Transportation Screening Complete  Some recent data might be hidden

## 2020-01-05 NOTE — Progress Notes (Signed)
Physical Therapy Treatment Patient Details Name: [name] MRN: [id number] DOB: 01-23-1923 Today's Date: 01/05/2020    History of Present Illness Pt is a 86 y.o. female with recent admission (11/2019) after fall sustaining T12 compression fx with conservative management and d/c to SNF; now readmitted 12/31/19 with worsening pain radiating to BLEs. Imaging with progressive T12 compression fx with retropulsion and spinal stenosis. Other PMH includes HTN, OA, R THA depression.   PT Comments    Pt progressing with mobility. Limited by c/o pain and nausea this session. Pt continues to require consistent minA for mobility and to prevent LOB. Requires assist to don/doff TLSO brace prior to mobilizing. Demonstrates poor attention and short-term memory, requiring frequent redirection to task. Pt remains motivated to participate. Continue to recommend SNF-level therapies to maximize functional mobility and independence.    Follow Up Recommendations  SNF;Supervision/Assistance - 24 hour     Equipment Recommendations  None recommended by PT    Recommendations for Other Services       Precautions / Restrictions Precautions Precautions: Fall Required Braces or Orthoses: Spinal Brace Spinal Brace: Thoracolumbosacral orthotic Restrictions Weight Bearing Restrictions: No    Mobility  Bed Mobility Overal bed mobility: Needs Assistance Bed Mobility: Supine to Sit     Supine to sit: Supervision;HOB elevated Sit to supine: Min assist   General bed mobility comments: Pt coming up into long sitting then scooting to EOB  Transfers Overall transfer level: Needs assistance Equipment used: Rolling walker (2 wheeled) Transfers: Sit to/from Stand Sit to Stand: Min assist         General transfer comment: Reliant on minA to elevate trunk and maintain balance standing from varying surface heights, reliant on UE support. Further distance limited by c/o bilateral knee pain and  fatigue  Ambulation/Gait Ambulation/Gait assistance: Min assist Gait Distance (Feet): 40 Feet Assistive device: Rolling walker (2 wheeled) Gait Pattern/deviations: Step-through pattern;Decreased stride length;Trunk flexed Gait velocity: decreased   General Gait Details: Slow, mildly unsteady gait with RW and intermittent minA to maintain balance; pt easily distracted requiring cues to stay on task and remember directions given   Stairs             Wheelchair Mobility    Modified Rankin (Stroke Patients Only)       Balance Overall balance assessment: Needs assistance Sitting-balance support: Feet supported Sitting balance-Leahy Scale: Fair     Standing balance support: Single extremity supported;During functional activity Standing balance-Leahy Scale: Poor Standing balance comment: Reliant on at least single UE support                            Cognition Arousal/Alertness: Awake/alert Behavior During Therapy: WFL for tasks assessed/performed Overall Cognitive Status: No family/caregiver present to determine baseline cognitive functioning Area of Impairment: Memory;Attention;Following commands;Safety/judgement;Awareness;Problem solving                   Current Attention Level: Selective Memory: Decreased short-term memory Following Commands: Follows multi-step commands inconsistently Safety/Judgement: Decreased awareness of safety;Decreased awareness of deficits Awareness: Emergent Problem Solving: Requires verbal cues        Exercises      General Comments General comments (skin integrity, edema, etc.): Requires assist to don TLSO brace. Limited by nausea after drinking medicine to help constipation      Pertinent Vitals/Pain Pain Assessment: Faces Pain Score: 5  Faces Pain Scale: Hurts little more Pain Location: Lower back; nausea/constipation Pain Descriptors / Indicators:  Guarding Pain Intervention(s): Monitored during  session;Patient requesting pain meds-RN notified    Home Living                      Prior Function            PT Goals (current goals can now be found in the care plan section) Acute Rehab PT Goals Patient Stated Goal: To return home.  Progress towards PT goals: Progressing toward goals    Frequency    Min 3X/week      PT Plan Current plan remains appropriate    Co-evaluation              AM-PAC PT "6 Clicks" Mobility   Outcome Measure  Help needed turning from your back to your side while in a flat bed without using bedrails?: None Help needed moving from lying on your back to sitting on the side of a flat bed without using bedrails?: A Little Help needed moving to and from a bed to a chair (including a wheelchair)?: A Little Help needed standing up from a chair using your arms (e.g., wheelchair or bedside chair)?: A Little Help needed to walk in hospital room?: A Little Help needed climbing 3-5 steps with a railing? : A Lot 6 Click Score: 18    End of Session Equipment Utilized During Treatment: Back brace Activity Tolerance: Patient tolerated treatment well;Patient limited by pain Patient left: in chair;with call bell/phone within reach;with chair alarm set Nurse Communication: Mobility status PT Visit Diagnosis: Unsteadiness on feet (R26.81);Muscle weakness (generalized) (M62.81);Difficulty in walking, not elsewhere classified (R26.2);Pain     Time: 1829-9371 PT Time Calculation (min) (ACUTE ONLY): 20 min  Charges:  $Therapeutic Activity: 8-22 mins                    Mabeline Caras, PT, DPT Acute Rehabilitation Services  Pager [phone] Office [place] 01/05/2020, 4:06 PM

## 2020-01-05 NOTE — Progress Notes (Signed)
87 year old female admitted for pain control after reinjury to a T12 compression/burst fracture.  Dr. Marcello Moores evaluated over the weekend and I am in agreement with his assessment and plan.  I do not think that any direct surgical decompression or stabilization is appropriate given her advanced age and bone quality.  Kyphoplasty carries a significant risk given her fracture morphology and bone retropulsion.  At this point I would recommend continue efforts at pain control and activity restriction.  Bracing when out of bed.  I think that if we can get the fracture to begin healing in situ that her pain should improve hopefully over the next 1 to 2 weeks.  If she still has intractable pain after 2 weeks of very light activity we could revisit the possibility of kyphoplasty however this would remain somewhat high risk.

## 2020-01-05 NOTE — Progress Notes (Signed)
PROGRESS NOTE  [name] KVQ:[id number] DOB: 1922/09/11   PCP: Harrison Mons, PA  Patient is from: Home  DOA: 12/31/2019 LOS: 4  Brief Narrative / Interim history: 86 year old female with history of HTN, HLD and recent T12 compression fracture after fall presenting with acute on chronic pain.  Reportedly, her pain gotten worse in the last 24 hours that prompted her to come to ED.   Of note, patient has had 4 prescriptions of Percocet (#90 on 6/9, #30 on 6/4, #28 on 5/24 and #18 on 5/21 on discharge from hospital).   In ED, she received 2 doses of IV Dilaudid 1 mg 30 minutes apart without adequate pain control which prompted admission for pain control.  On admission, patient was somewhat lethargic.   The next day, patient continued to endorse significant bilateral calf pain without back pain.  CT lumbar spine ordered and revealed known comminuted T12 body fracture with progressive height loss, retropulsion and related advanced spinal stenosis without signs of interval healing.  Neurosurgery, Dr. Marcello Moores consulted and recommended MRI of thoracic spine and lumbar spine, and Dr. [name] to see patient the next day.  MRI obtained later in the day showed severe T12 compression fracture with osteonecrosis and retropulsion resulting in moderate mass-effect on the conus medullaris without conus signal abnormality, mild marrow edema at the inferior T11 body without evidence of impending collapse and superimposed degenerative thoracic disc herniation at T6-T7 resulting in mild spinal stenosis and cord mass-effect without cord signal abnormality.   Evaluated by neurosurgery, Dr. Marcello Moores on 6/13 who recommended conservative care with aggressive pain control given risk of surgery and kyphoplasty. Dr. [name] concurs with Dr. Manon Hilding recommendation.  Pain is slowly improving.  Therapy recommended SNF.   Subjective: Seen and examined earlier this morning.  No major events overnight or this morning.  Pain seems to  be fairly controlled.  She rates her pain 0/10 at the moment while lying. However, her pain gets worse with movement. She didn't seem to have pain raising her legs today. Overall, her pain has improved.  However, she has not had a bowel movement yet.  Objective: Vitals:   01/04/20 1350 01/04/20 2001 01/05/20 0349 01/05/20 0740  BP: 117/67 119/65 (!) 173/115 (!) 168/90  Pulse: (!) 53 60 67 62  Resp: 17 17 18 17   Temp: 97.6 F (36.4 C) 97.6 F (36.4 C) 97.8 F (36.6 C) 97.7 F (36.5 C)  TempSrc: Oral Oral Oral Oral  SpO2: 94% 93% 98% 98%  Weight:      Height:        Intake/Output Summary (Last 24 hours) at 01/05/2020 1534 Last data filed at 01/05/2020 0900 Gross per 24 hour  Intake 240 ml  Output 1200 ml  Net -960 ml   Filed Weights   12/31/19 0018  Weight: 70.3 kg    Examination:  GENERAL: No apparent distress.  Nontoxic. HEENT: MMM.  Vision and hearing grossly intact.  NECK: Supple.  No apparent JVD.  RESP:  No IWOB.  Fair aeration bilaterally. CVS:  RRR.  2/6 SEM over RUSB and LUSB (chronic). ABD/GI/GU: BS+. Abd soft, NTND.  MSK/EXT:  Moves extremities. No apparent deformity. No edema.  SKIN: no apparent skin lesion or wound NEURO: Awake, alert and oriented appropriately.  4+/5 with bilateral hip flexion. PSYCH: Calm. Normal affect.  Procedures:  None  Microbiology summarized: COVID-19 PCR negative.  Assessment & Plan: Progressive T12 compression fracture with retropulsion and spinal stenosis Degenerative thoracic disc herniation at T6-T7 resulting  in mild spinal stenosis and cord mass-effect Bilateral lower extremity pain-suspect claudication due to the above. Chronic back pain-likely due to the above -No cauda equina signs.  About 4+/5 BLE strength on exam.  -No plan for surgical intervention or kyphoplasty per neurosurgery (Dr. Marcello Moores and Dr. [name]). -TLSO brace when up and out of bed. -Continue current pain regimen -OxyContin 10 mg every 12  hours -Percocet 5 mg every 6 hours as needed for severe and breakthrough pain -Tylenol 1 g every 8 hours -Flexeril 5 mg every 8 hours as needed -Gabapentin to 300 mg after breakfast and lunch, and 600 mg at night -Wean Decadron to 4 mg daily>>>6/20 -Subcu Lovenox for VTE prophylaxis -Continue PT/OT  Essential hypertension: Slightly hypertensive -Continue amlodipine 10 mg daily -Add low-dose HCTZ.  Allergic to ACE inhibitors -As needed hydralazine  Mild hyponatremia: She is euvolemic.  Due to Cymbalta? -Recheck in the morning  Normocytic anemia: Stable -Check anemia panel and monitor.  Hypomagnesemia: Resolved. -Monitor and replenish as appropriate.  Hyperlipidemia -Continue home statin  Constipation: Likely due to opiate. -Schedule Senokot-S and MiraLAX.   -Ordered mag citrate and enema if no resolution with the above  Goal of care/DNR/DNI-appropriate -Appreciate palliative care input.           DVT prophylaxis:  enoxaparin (LOVENOX) injection 40 mg Start: 01/02/20 1545 Place and maintain sequential compression device Start: 12/31/19 1444  Code Status: DNR/DNI Family Communication: Updated patient's daughter and son over the phone on 6/15. Status is: Inpatient  Discharge barrier: Unsafe d/c plan   Dispo:: Likely due to opiate.  Is from: Home              Anticipated d/c is to: SNF              Anticipated d/c date is: 1 day              Patient currently is medically stable to d/c.         Consultants:  Neurosurgery Palliative medicine  Sch Meds:  Scheduled Meds: . acetaminophen  1,000 mg Oral Q8H  . amLODipine  10 mg Oral Daily  . calcitonin (salmon)  1 spray Alternating Nares q1800  . dexamethasone  4 mg Oral Q12H  . DULoxetine  30 mg Oral Daily  . enoxaparin (LOVENOX) injection  40 mg Subcutaneous Q24H  . gabapentin  300 mg Oral BID WC   And  . gabapentin  600 mg Oral QHS  . lidocaine  2 patch Transdermal Q24H  . oxyCODONE  10 mg Oral Q12H   . polyethylene glycol  17 g Oral BID  . senna-docusate  2 tablet Oral BID   Continuous Infusions:  PRN Meds:.cyclobenzaprine, hydrALAZINE, ondansetron **OR** ondansetron (ZOFRAN) IV, oxyCODONE  Antimicrobials: Anti-infectives (From admission, onward)   None       I have personally reviewed the following labs and images: CBC: Recent Labs  Lab 12/31/19 0120 12/31/19 0120 12/31/19 2229 01/01/20 0929 01/02/20 0301 01/02/20 1649 01/04/20 0837  WBC 7.5  --  5.9 6.4  --  8.9  --   NEUTROABS 4.5  --  3.0  --   --   --   --   HGB 11.5*   < > 9.9* 9.9* 10.9* 10.5* 12.1  HCT 37.0   < > 32.6* 32.3* 34.5* 32.7* 37.0  MCV 95.1  --  96.4 96.7  --  91.9  --   PLT 277  --  264 221  --  229  --    < > =  values in this interval not displayed.   BMP &GFR Recent Labs  Lab 12/31/19 0120 12/31/19 0613 01/01/20 0929 01/02/20 0301 01/02/20 1649 01/04/20 0837  NA 134* 136 137  --   --  133*  K 3.9 3.9 4.3  --   --  3.9  CL 96* 100 102  --   --  93*  CO2 27 28 28   --   --  30  GLUCOSE 109* 108* 108*  --   --  118*  BUN 23 21 17   --   --  17  CREATININE 0.97 1.04* 0.82  --  0.61 0.64  CALCIUM 9.5 9.2 9.0  --   --  9.3  MG  --   --  1.5* 1.8  --  1.7  PHOS  --   --  4.0  --   --  3.1   Estimated Creatinine Clearance: 40.9 mL/min (by C-G formula based on SCr of 0.64 mg/dL). Liver & Pancreas: Recent Labs  Lab 12/31/19 0613 01/01/20 0929 01/04/20 0837  AST 18  --   --   ALT 14  --   --   ALKPHOS 97  --   --   BILITOT 0.4  --   --   PROT 5.8*  --   --   ALBUMIN 3.0* 2.9* 3.3*   No results for input(s): LIPASE, AMYLASE in the last 168 hours. No results for input(s): AMMONIA in the last 168 hours. Diabetic: No results for input(s): HGBA1C in the last 72 hours. Recent Labs  Lab 01/04/20 0806 01/04/20 1114 01/04/20 1616 01/04/20 2002 01/05/20 0345  GLUCAP 117* 121* 130* 168* 122*   Cardiac Enzymes: No results for input(s): CKTOTAL, CKMB, CKMBINDEX, TROPONINI in the last  168 hours. No results for input(s): PROBNP in the last 8760 hours. Coagulation Profile: No results for input(s): INR, PROTIME in the last 168 hours. Thyroid Function Tests: No results for input(s): TSH, T4TOTAL, FREET4, T3FREE, THYROIDAB in the last 72 hours. Lipid Profile: No results for input(s): CHOL, HDL, LDLCALC, TRIG, CHOLHDL, LDLDIRECT in the last 72 hours. Anemia Panel: No results for input(s): VITAMINB12, FOLATE, FERRITIN, TIBC, IRON, RETICCTPCT in the last 72 hours. Urine analysis:    Component Value Date/Time   COLORURINE YELLOW 02/14/2016 1844   APPEARANCEUR CLEAR 02/14/2016 1844   LABSPEC 1.013 02/14/2016 1844   PHURINE 5.5 02/14/2016 1844   GLUCOSEU NEGATIVE 02/14/2016 1844   HGBUR TRACE (A) 02/14/2016 1844   BILIRUBINUR NEGATIVE 02/14/2016 1844   BILIRUBINUR n 12/06/2010 0000   KETONESUR NEGATIVE 02/14/2016 1844   PROTEINUR NEGATIVE 02/14/2016 1844   UROBILINOGEN 0.2 12/19/2011 1104   NITRITE NEGATIVE 02/14/2016 1844   LEUKOCYTESUR NEGATIVE 02/14/2016 1844   Sepsis Labs: Invalid input(s): PROCALCITONIN, [place]  Microbiology: Recent Results (from the past 240 hour(s))  SARS Coronavirus 2 by RT PCR (hospital order, performed in [facility] hospital lab) Nasopharyngeal Nasopharyngeal Swab     Status: None   Collection Time: 12/31/19  2:17 AM   Specimen: Nasopharyngeal Swab  Result Value Ref Range Status   SARS Coronavirus 2 NEGATIVE NEGATIVE Final    Comment: (NOTE) SARS-CoV-2 target nucleic acids are NOT DETECTED.  The SARS-CoV-2 RNA is generally detectable in upper and lower respiratory specimens during the acute phase of infection. The lowest concentration of SARS-CoV-2 viral copies this assay can detect is 250 copies / mL. A negative result does not preclude SARS-CoV-2 infection and should not be used as the sole basis for treatment or other patient  management decisions.  A negative result may occur with improper specimen collection / handling,  submission of specimen other than nasopharyngeal swab, presence of viral mutation(s) within the areas targeted by this assay, and inadequate number of viral copies (<250 copies / mL). A negative result must be combined with clinical observations, patient history, and epidemiological information.  Fact Sheet for Patients:   StrictlyIdeas.no  Fact Sheet for Healthcare Providers: BankingDealers.co.za  This test is not yet approved or  cleared by the [place] FDA and has been authorized for detection and/or diagnosis of SARS-CoV-2 by FDA under an Emergency Use Authorization (EUA).  This EUA will remain in effect (meaning this test can be used) for the duration of the COVID-19 declaration under Section 564(b)(1) of the Act, 21 U.S.C. section 360bbb-3(b)(1), unless the authorization is terminated or revoked sooner.  Performed at [facility], [address] [place], [place] [address]   SARS CORONAVIRUS 2 (TAT 6-24 HRS) Nasopharyngeal Nasopharyngeal Swab     Status: None   Collection Time: 01/05/20  8:24 AM   Specimen: Nasopharyngeal Swab  Result Value Ref Range Status   SARS Coronavirus 2 NEGATIVE NEGATIVE Final    Comment: (NOTE) SARS-CoV-2 target nucleic acids are NOT DETECTED.  The SARS-CoV-2 RNA is generally detectable in upper and lower respiratory specimens during the acute phase of infection. Negative results do not preclude SARS-CoV-2 infection, do not rule out co-infections with other pathogens, and should not be used as the sole basis for treatment or other patient management decisions. Negative results must be combined with clinical observations, patient history, and epidemiological information. The expected result is Negative.  Fact Sheet for Patients: SugarRoll.be  Fact Sheet for Healthcare Providers: https://www.woods-mathews.com/  This test is not yet approved  or cleared by the [place] FDA and  has been authorized for detection and/or diagnosis of SARS-CoV-2 by FDA under an Emergency Use Authorization (EUA). This EUA will remain  in effect (meaning this test can be used) for the duration of the COVID-19 declaration under Se ction 564(b)(1) of the Act, 21 U.S.C. section 360bbb-3(b)(1), unless the authorization is terminated or revoked sooner.  Performed at [facility], [place] [address] [place], [place] [address]     Radiology Studies: No results found.    Savir Blanke T. [place]  If 7PM-7AM, please contact night-coverage [email or web address] Password [facility] 01/05/2020, 3:34 PM

## 2020-01-06 DIAGNOSIS — R5381 Other malaise: Secondary | ICD-10-CM

## 2020-01-06 DIAGNOSIS — Z7189 Other specified counseling: Secondary | ICD-10-CM

## 2020-01-06 DIAGNOSIS — D649 Anemia, unspecified: Secondary | ICD-10-CM

## 2020-01-06 LAB — IRON AND TIBC
Iron: 74 ug/dL (ref 28–170)
Saturation Ratios: 26 % (ref 10.4–31.8)
TIBC: 290 ug/dL (ref 250–450)
UIBC: 216 ug/dL

## 2020-01-06 LAB — GLUCOSE, CAPILLARY
Glucose-Capillary: 78 mg/dL (ref 70–99)
Glucose-Capillary: 122 mg/dL — ABNORMAL HIGH (ref 70–99)

## 2020-01-06 LAB — RETICULOCYTES
Immature Retic Fract: 11.1 % (ref 2.3–15.9)
RBC.: 4.14 MIL/uL (ref 3.87–5.11)
Retic Count, Absolute: 94 10*3/uL (ref 19.0–186.0)
Retic Ct Pct: 2.3 % (ref 0.4–3.1)

## 2020-01-06 LAB — FERRITIN: Ferritin: 180 ng/mL (ref 11–307)

## 2020-01-06 LAB — VITAMIN B12: Vitamin B-12: 726 pg/mL (ref 180–914)

## 2020-01-06 LAB — FOLATE: Folate: 11.4 ng/mL (ref >5.9)

## 2020-01-06 MED ORDER — CALCITONIN (SALMON) 200 UNIT/ACT NA SOLN: 1.0000 | Freq: Every day | NASAL | 12 refills | Status: AC

## 2020-01-06 MED ORDER — GABAPENTIN 300 MG PO CAPS: ORAL_CAPSULE | ORAL | Status: DC

## 2020-01-06 MED ORDER — DOCUSATE SODIUM 100 MG PO CAPS: 200.0000 mg | ORAL_CAPSULE | Freq: Two times a day (BID) | ORAL | 0 refills | Status: AC

## 2020-01-06 MED ORDER — ENOXAPARIN SODIUM 40 MG/0.4ML ~~LOC~~ SOLN: 40.0000 mg | SUBCUTANEOUS | Status: DC

## 2020-01-06 MED ORDER — OXYCODONE HCL ER 10 MG PO T12A: 10.0000 mg | EXTENDED_RELEASE_TABLET | Freq: Two times a day (BID) | ORAL | 0 refills | Status: DC

## 2020-01-06 MED ORDER — SENNOSIDES-DOCUSATE SODIUM 8.6-50 MG PO TABS
1.0000 | ORAL_TABLET | Freq: Two times a day (BID) | ORAL | 0 refills | Status: AC | PRN
Start: 2020-01-06 — End: ?

## 2020-01-06 MED ORDER — POLYETHYLENE GLYCOL 3350 17 GM/SCOOP PO POWD: 17.0000 g | Freq: Two times a day (BID) | ORAL | 0 refills | Status: AC | PRN

## 2020-01-06 MED ORDER — OXYCODONE HCL 5 MG PO TABS: 5.0000 mg | ORAL_TABLET | Freq: Four times a day (QID) | ORAL | 0 refills | Status: AC | PRN

## 2020-01-06 MED ORDER — DEXAMETHASONE 4 MG PO TABS: 4.0000 mg | ORAL_TABLET | Freq: Every day | ORAL | 0 refills | Status: DC

## 2020-01-06 MED ORDER — DULOXETINE HCL 30 MG PO CPEP: 30.0000 mg | ORAL_CAPSULE | Freq: Every day | ORAL | 1 refills | Status: DC

## 2020-01-06 MED ORDER — ACETAMINOPHEN 500 MG PO TABS: 1000.0000 mg | ORAL_TABLET | Freq: Three times a day (TID) | ORAL | 0 refills | Status: DC

## 2020-01-06 NOTE — Progress Notes (Signed)
[facility] transported pt to [facility];Pt in stable condition.

## 2020-01-06 NOTE — Progress Notes (Signed)
Pt report given to [facility] receiving RN.Pt is going to room 1201.

## 2020-01-06 NOTE — Care Management Important Message (Signed)
Important Message  Patient Details  Name: [name] MRN: [id number] Date of Birth: February 07, 1923   Medicare Important Message Given:  Yes  Called patient in room to remind of right to appeal did not get an answer.  IM mailed to address.   Valaree Fresquez 01/06/2020, 4:02 PM

## 2020-01-06 NOTE — Discharge Summary (Signed)
Physician Discharge Summary  [name] TGG:[id number] DOB: 11/26/1922 DOA: 12/31/2019  PCP: Harrison Mons, PA  Admit date: 12/31/2019 Discharge date: 01/06/2020  Admitted From: Home Disposition: SNF  Recommendations for Outpatient Follow-up:  1. Follow ups as below. 2. Please obtain CBC/BMP/Mag at follow up 3. Please follow up on the following pending results: None   Discharge Condition: Stable CODE STATUS: DNR/DNI   Follow-up Information    Earnie Larsson, MD. Schedule an appointment as soon as possible for a visit in 2 week(s).   Specialty: Neurosurgery Contact information: [address] [place] [address] [phone]                Hospital Course: 86 year old female with history of HTN, HLD and recent T12 compression fracture after fall presenting with acute on chronic pain.  Reportedly, her pain gotten worse in the last 24 hours that prompted her to come to ED.   Of note, patient has had 4 prescriptions of Percocet (#90 on 6/9, #30 on 6/4, #28 on 5/24 and #18 on 5/21 on discharge from hospital).   In ED, she received 2 doses of IV Dilaudid 1 mg 30 minutes apart without adequate pain control which prompted admission for pain control.  On admission, patient was somewhat lethargic.   The next day, patient continued to endorse significant bilateral calf pain without back pain.  CT lumbar spine ordered and revealed known comminuted T12 body fracture with progressive height loss, retropulsion and related advanced spinal stenosis without signs of interval healing.  Neurosurgery, Dr. Marcello Moores consulted and recommended MRI of thoracic spine and lumbar spine, and Dr. [name] to see patient the next day.  MRI obtained later in the day showed severe T12 compression fracture with osteonecrosis and retropulsion resulting in moderate mass-effect on the conus medullaris without conus signal abnormality, mild marrow edema at the inferior T11 body without evidence of  impending collapse and superimposed degenerative thoracic disc herniation at T6-T7 resulting in mild spinal stenosis and cord mass-effect without cord signal abnormality.   Evaluated by neurosurgery, Dr. Marcello Moores on 6/13 who recommended conservative care with aggressive pain control given risk of surgery and kyphoplasty. Dr. [name] concurs with Dr. Manon Hilding recommendation.  Pain fairly controlled on oral pain medications.  Follow-up with Dr. [name], neurosurgery in 2 weeks.   Patient was evaluated by therapy who recommended SNF for daily rehab.  Discharge Diagnoses:  Progressive T12 compression fracture with retropulsion and spinal stenosis Degenerative thoracic disc herniation at T6-T7 resulting in mild spinal stenosis and cord mass-effect Bilateral lower extremity pain-suspect claudication due to the above. Chronic back pain-likely due to the above -No cauda equina signs.  About 4+/5 BLE strength on exam.  -No plan for surgical intervention or kyphoplasty per neurosurgery (Dr. Marcello Moores and Dr. [name]). -TLSO brace when up and out of bed. -Continue current pain regimen -OxyContin 10 mg every 12 hours -Percocet 5 mg every 6 hours as needed for severe and breakthrough pain -Tylenol 1 g every 8 hours -Gabapentin to 300 mg after breakfast and lunch, and 600 mg at night -Wean Decadron to 4 mg daily>>>6/20 -Subcu Lovenox for VTE prophylaxis -Continue PT/OT -Outpatient follow-up with neurosurgery in 2 weeks  Essential hypertension: Slightly hypertensive -Discharged on home medications.  Mild hyponatremia: She is euvolemic.  Due to Cymbalta? -BMP in 1 week  Normocytic anemia: Stable.  Anemia panel normal.  Hypomagnesemia: Resolved.  Hyperlipidemia -Continue home statin  Constipation:  Resolved. -As needed Senokot-S and MiraLAX  Goal of  care/DNR/DNI-appropriate -Appreciate palliative care input.   Body mass index is 27.03 kg/m.            Discharge Exam: Vitals:    01/05/20 1948 01/06/20 0312  BP: 132/80 (!) 160/79  Pulse: 66 68  Resp: 16 16  Temp: 97.6 F (36.4 C) (!) 97.4 F (36.3 C)  SpO2: 96% 96%    GENERAL: No apparent distress.  Nontoxic. HEENT: MMM.  Vision and hearing grossly intact.  NECK: Supple.  No apparent JVD.  RESP:  No IWOB.  Fair aeration bilaterally. CVS:  RRR. Heart sounds normal.  ABD/GI/GU: Bowel sounds present. Soft. Non tender.  MSK/EXT:  Moves extremities. No apparent deformity. No edema.  SKIN: no apparent skin lesion or wound NEURO: Awake, alert and oriented appropriately.  No apparent focal neuro deficit.  Motor 4+/5 with hip flexion bilaterally PSYCH: Calm. Normal affect.  Discharge Instructions  Discharge Instructions    Diet - low sodium heart healthy   Complete by: As directed    Increase activity slowly   Complete by: As directed      Allergies as of 01/06/2020      Reactions   Cartia Xt [diltiazem] Other (See Comments)   BRADYCARDIA IN 40'S   Codeine Other (See Comments)   syncope   Lisinopril-hydrochlorothiazide Cough   Mercury Rash   Sulfa Antibiotics    Sulfonamide Derivatives Rash   swelling      Medication List    STOP taking these medications   escitalopram 5 MG tablet Commonly known as: LEXAPRO   HYDROcodone-acetaminophen 5-325 MG tablet Commonly known as: NORCO/VICODIN     TAKE these medications   acetaminophen 500 MG tablet Commonly known as: TYLENOL Take 2 tablets (1,000 mg total) by mouth every 8 (eight) hours. What changed:   medication strength  how much to take  when to take this  reasons to take this   bisacodyl 5 MG EC tablet Commonly known as: DULCOLAX Take 1 tablet (5 mg total) by mouth daily as needed for moderate constipation.   calcitonin (salmon) 200 UNIT/ACT nasal spray Commonly known as: MIACALCIN/FORTICAL Place 1 spray into alternate nostrils daily at 6 PM.   dexamethasone 4 MG tablet Commonly known as: DECADRON Take 1 tablet (4 mg total) by  mouth daily.   docusate sodium 100 MG capsule Commonly known as: COLACE Take 2 capsules (200 mg total) by mouth 2 (two) times daily. What changed:   when to take this  reasons to take this   DULoxetine 30 MG capsule Commonly known as: CYMBALTA Take 1 capsule (30 mg total) by mouth daily.   enoxaparin 40 MG/0.4ML injection Commonly known as: LOVENOX Inject 0.4 mLs (40 mg total) into the skin daily for 21 days.   feeding supplement (ENSURE ENLIVE) Liqd Take 237 mLs by mouth 2 (two) times daily between meals. Give 2 week supply   gabapentin 300 MG capsule Commonly known as: NEURONTIN Take 1 capsule (300 mg total) by mouth 2 (two) times daily with breakfast and lunch AND 2 capsules (600 mg total) at bedtime. What changed: See the new instructions.   hydrALAZINE 25 MG tablet Commonly known as: APRESOLINE Take 1 tablet (25 mg total) by mouth every 8 (eight) hours.   lidocaine 5 % Commonly known as: LIDODERM Place 1 patch onto the skin daily. Remove & Discard patch within 12 hours or as directed by MD   losartan 50 MG tablet Commonly known as: COZAAR Take 50 mg by mouth daily.  meloxicam 7.5 MG tablet Commonly known as: MOBIC Take 7.5 mg by mouth 2 (two) times daily.   mirtazapine 15 MG tablet Commonly known as: REMERON Take 15 mg by mouth at bedtime.   omeprazole 20 MG capsule Commonly known as: PRILOSEC Take 1 capsule (20 mg total) by mouth daily.   oxyCODONE 5 MG immediate release tablet Commonly known as: Oxy IR/ROXICODONE Take 1 tablet (5 mg total) by mouth every 6 (six) hours as needed for up to 7 days for severe pain or breakthrough pain.   oxyCODONE 10 mg 12 hr tablet Commonly known as: OXYCONTIN Take 1 tablet (10 mg total) by mouth every 12 (twelve) hours.   polyethylene glycol powder 17 GM/SCOOP powder Commonly known as: MiraLax Take 17 g by mouth 2 (two) times daily as needed for moderate constipation.   senna-docusate 8.6-50 MG tablet Commonly  known as: Senokot-S Take 1 tablet by mouth 2 (two) times daily between meals as needed for mild constipation.   simvastatin 20 MG tablet Commonly known as: ZOCOR TAKE 1 TABLET EVERY DAY What changed: when to take this   Vitamin D3 50 MCG (2000 UT) Tabs Take 2,000 Units by mouth daily.       Consultations:  Neurosurgery  Procedures/Studies:   CT LUMBAR SPINE WO CONTRAST  Result Date: 12/31/2019 CLINICAL DATA:  Worsening back pain EXAM: CT LUMBAR SPINE WITHOUT CONTRAST TECHNIQUE: Multidetector CT imaging of the lumbar spine was performed without intravenous contrast administration. Multiplanar CT image reconstructions were also generated. COMPARISON:  12/04/2019 FINDINGS: Segmentation: Standard lumbar numbering Alignment: Degenerative grade 1 anterolisthesis at L5-S1 Vertebrae: Known T12 vertebral body fracture with comminution and depression that has increased. Height loss has progressed to 60%. There is retropulsion that also appears progressive, with advanced spinal stenosis presumably impinging on the conus which terminates at T12-L1 by 2017 MRI. L5 bilateral transverse process fracture with periosteal reaction from attempted healing. No evidence of underlying bone lesion. No new fracture seen. Paraspinal and other soft tissues: Paraspinal edema about the recent fracture. Disc levels: Generalized degenerative disc narrowing and bulging with disc calcification. There is also diffuse degenerative posterior element hypertrophy. Spinal stenosis is seen at lumbar each disc level, moderate to advanced at L2-3 to L4-5. Bulging disc and facet spurring diffusely encroaches on bilateral foramina. IMPRESSION: 1. Known comminuted T12 body fracture with progressive height loss and retropulsion since 12/04/2019. No signs of interval healing and related spinal stenosis is advanced. 2. Healing bilateral L5 transverse process fractures. 3. Diffuse degenerative disease with high-grade spinal stenosis at L2-3  to L4-5. Electronically Signed   By: Monte Fantasia M.D.   On: 12/31/2019 14:26   MR THORACIC SPINE WO CONTRAST  Result Date: 12/31/2019 CLINICAL DATA:  86 year old female with increasing back pain. T12 vertebral fracture with progressive loss of height by CT today compared to 12/04/2019. EXAM: MRI THORACIC AND LUMBAR SPINE WITHOUT CONTRAST TECHNIQUE: Multiplanar and multiecho pulse sequences of the thoracic and lumbar spine, were obtained without intravenous contrast. COMPARISON:  CT lumbar spine today and 12/04/2019. FINDINGS: MRI THORACIC SPINE FINDINGS Limited cervical spine imaging: Normal cervicothoracic junction alignment. Thoracic spine segmentation:  Appears to be normal. Alignment:  Preserved thoracic kyphosis.  No spondylolisthesis. Vertebrae: Severe compression fracture of T12 with comminution and vertebral gas compatible with osteonecrosis. Moderate to severe loss of vertebral body height and retropulsion appear stable from the CT earlier today. No dissociated spinal stenosis at the level of the conus on series 5, image 9 and series 8, image 52.  This is moderate to severe (AP thecal sac reduced to 3-4 mm) and there is mass effect on the conus (series 7, image 4) although no conus signal abnormality is evident. T11 and T12 neural foraminal involvement is less pronounced. Signal heterogeneity in the T11-T12 disc is likely posttraumatic. There is faint marrow edema also at the anterior T11 body and inferior T11 endplate. But no other osseous abnormality in the thoracic spine. Cord: No abnormal thoracic spinal cord signal despite stenosis with mass effect at both T6-T7 and T12. The conus is at T12. Paraspinal and other soft tissues: Negative visible thoracic viscera aside from trace layering pleural effusions. Negative visible upper abdominal viscera. Thoracic paraspinal soft tissues remain normal. Disc levels: Mild or age-appropriate thoracic spine degeneration except as follows: T6-T7: Bulky right  paracentral disc herniation (series 5 image 9 and series 8, image 28) with mild spinal stenosis and right hemi cord mass effect. No foraminal involvement. T12 retropulsion superimposed on mild disc bulging and posterior element hypertrophy at both T11-T12 and T12-L1. This level was already discussed above. MRI LUMBAR SPINE FINDINGS Segmentation:  Normal, concordant with the thoracic spine numbering. Alignment: Stable lumbar lordosis from the CT earlier today. Mild grade 1 anterolisthesis at both L4-L5 and L5-S1. Vertebrae: On these images no lumbar marrow edema or acute osseous abnormality is identified. Visible sacrum and SI joints appear intact. Conus medullaris and cauda equina: The conus is compressed at T12 as detailed above. Cauda equina nerve roots appear normal aside from degenerative lumbar spinal stenosis. Paraspinal and other soft tissues: Sludge layering in the gallbladder on series 7, image 19. Otherwise negative visible abdominal viscera. Lumbar paraspinal soft tissues are within normal limits. Disc levels: Mostly age-appropriate lumbar spine degeneration, with mild multifactorial lumbar spinal stenosis from L1-L2 through L4-L5. Ligament flavum hypertrophy and/or facet arthropathy are severe from L3-L4 through L5-S1. Associated moderate to severe neural foraminal stenosis at the left L3, right L4 nerve levels. IMPRESSION: 1. Severe T12 compression fracture with osteonecrosis and retropulsion seen by CT today. Retropulsion results in moderate mass effect on the conus medullaris, but no conus signal abnormality is identified. 2. Mild marrow edema at the inferior T11 body, without evidence of impending collapse. 3. Superimposed degenerative thoracic disc herniation at T6-T7 results in mild spinal stenosis and cord mass effect, no cord signal abnormality. 4. No acute osseous abnormality identified in the lumbar spine. Lumbar spine degeneration resulting in generalized mild lumbar spinal stenosis, and up to  severe neural foraminal stenosis at the left L3 and right L4 nerve levels. Electronically Signed   By: Genevie Ann M.D.   On: 12/31/2019 18:39   MR LUMBAR SPINE WO CONTRAST  Result Date: 12/31/2019 CLINICAL DATA:  86 year old female with increasing back pain. T12 vertebral fracture with progressive loss of height by CT today compared to 12/04/2019. EXAM: MRI THORACIC AND LUMBAR SPINE WITHOUT CONTRAST TECHNIQUE: Multiplanar and multiecho pulse sequences of the thoracic and lumbar spine, were obtained without intravenous contrast. COMPARISON:  CT lumbar spine today and 12/04/2019. FINDINGS: MRI THORACIC SPINE FINDINGS Limited cervical spine imaging: Normal cervicothoracic junction alignment. Thoracic spine segmentation:  Appears to be normal. Alignment:  Preserved thoracic kyphosis.  No spondylolisthesis. Vertebrae: Severe compression fracture of T12 with comminution and vertebral gas compatible with osteonecrosis. Moderate to severe loss of vertebral body height and retropulsion appear stable from the CT earlier today. No dissociated spinal stenosis at the level of the conus on series 5, image 9 and series 8, image 52. This is moderate  to severe (AP thecal sac reduced to 3-4 mm) and there is mass effect on the conus (series 7, image 4) although no conus signal abnormality is evident. T11 and T12 neural foraminal involvement is less pronounced. Signal heterogeneity in the T11-T12 disc is likely posttraumatic. There is faint marrow edema also at the anterior T11 body and inferior T11 endplate. But no other osseous abnormality in the thoracic spine. Cord: No abnormal thoracic spinal cord signal despite stenosis with mass effect at both T6-T7 and T12. The conus is at T12. Paraspinal and other soft tissues: Negative visible thoracic viscera aside from trace layering pleural effusions. Negative visible upper abdominal viscera. Thoracic paraspinal soft tissues remain normal. Disc levels: Mild or age-appropriate thoracic  spine degeneration except as follows: T6-T7: Bulky right paracentral disc herniation (series 5 image 9 and series 8, image 28) with mild spinal stenosis and right hemi cord mass effect. No foraminal involvement. T12 retropulsion superimposed on mild disc bulging and posterior element hypertrophy at both T11-T12 and T12-L1. This level was already discussed above. MRI LUMBAR SPINE FINDINGS Segmentation:  Normal, concordant with the thoracic spine numbering. Alignment: Stable lumbar lordosis from the CT earlier today. Mild grade 1 anterolisthesis at both L4-L5 and L5-S1. Vertebrae: On these images no lumbar marrow edema or acute osseous abnormality is identified. Visible sacrum and SI joints appear intact. Conus medullaris and cauda equina: The conus is compressed at T12 as detailed above. Cauda equina nerve roots appear normal aside from degenerative lumbar spinal stenosis. Paraspinal and other soft tissues: Sludge layering in the gallbladder on series 7, image 19. Otherwise negative visible abdominal viscera. Lumbar paraspinal soft tissues are within normal limits. Disc levels: Mostly age-appropriate lumbar spine degeneration, with mild multifactorial lumbar spinal stenosis from L1-L2 through L4-L5. Ligament flavum hypertrophy and/or facet arthropathy are severe from L3-L4 through L5-S1. Associated moderate to severe neural foraminal stenosis at the left L3, right L4 nerve levels. IMPRESSION: 1. Severe T12 compression fracture with osteonecrosis and retropulsion seen by CT today. Retropulsion results in moderate mass effect on the conus medullaris, but no conus signal abnormality is identified. 2. Mild marrow edema at the inferior T11 body, without evidence of impending collapse. 3. Superimposed degenerative thoracic disc herniation at T6-T7 results in mild spinal stenosis and cord mass effect, no cord signal abnormality. 4. No acute osseous abnormality identified in the lumbar spine. Lumbar spine degeneration  resulting in generalized mild lumbar spinal stenosis, and up to severe neural foraminal stenosis at the left L3 and right L4 nerve levels. Electronically Signed   By: Genevie Ann M.D.   On: 12/31/2019 18:39   DG CHEST PORT 1 VIEW  Result Date: 12/07/2019 CLINICAL DATA:  [place], T12 fracture on recent CT EXAM: PORTABLE CHEST 1 VIEW COMPARISON:  12/04/2019, 02/14/2016 FINDINGS: Single frontal view of the chest demonstrates an unremarkable cardiac silhouette. No airspace disease, effusion, or pneumothorax. The T12 compression fracture seen on recent CT is not adequately visualized on this portable exam. No other acute bony abnormalities. IMPRESSION: 1. No acute intrathoracic process. Electronically Signed   By: Randa Ngo M.D.   On: 12/07/2019 18:13        The results of significant diagnostics from this hospitalization (including imaging, microbiology, ancillary and laboratory) are listed below for reference.     Microbiology: Recent Results (from the past 240 hour(s))  SARS Coronavirus 2 by RT PCR (hospital order, performed in [facility] hospital lab) Nasopharyngeal Nasopharyngeal Swab     Status: None   Collection  Time: 12/31/19  2:17 AM   Specimen: Nasopharyngeal Swab  Result Value Ref Range Status   SARS Coronavirus 2 NEGATIVE NEGATIVE Final    Comment: (NOTE) SARS-CoV-2 target nucleic acids are NOT DETECTED.  The SARS-CoV-2 RNA is generally detectable in upper and lower respiratory specimens during the acute phase of infection. The lowest concentration of SARS-CoV-2 viral copies this assay can detect is 250 copies / mL. A negative result does not preclude SARS-CoV-2 infection and should not be used as the sole basis for treatment or other patient management decisions.  A negative result may occur with improper specimen collection / handling, submission of specimen other than nasopharyngeal swab, presence of viral mutation(s) within the areas targeted by this assay, and inadequate  number of viral copies (<250 copies / mL). A negative result must be combined with clinical observations, patient history, and epidemiological information.  Fact Sheet for Patients:   StrictlyIdeas.no  Fact Sheet for Healthcare Providers: BankingDealers.co.za  This test is not yet approved or  cleared by the [place] FDA and has been authorized for detection and/or diagnosis of SARS-CoV-2 by FDA under an Emergency Use Authorization (EUA).  This EUA will remain in effect (meaning this test can be used) for the duration of the COVID-19 declaration under Section 564(b)(1) of the Act, 21 U.S.C. section 360bbb-3(b)(1), unless the authorization is terminated or revoked sooner.  Performed at [facility], [address] [place], [place] [address]   SARS CORONAVIRUS 2 (TAT 6-24 HRS) Nasopharyngeal Nasopharyngeal Swab     Status: None   Collection Time: 01/05/20  8:24 AM   Specimen: Nasopharyngeal Swab  Result Value Ref Range Status   SARS Coronavirus 2 NEGATIVE NEGATIVE Final    Comment: (NOTE) SARS-CoV-2 target nucleic acids are NOT DETECTED.  The SARS-CoV-2 RNA is generally detectable in upper and lower respiratory specimens during the acute phase of infection. Negative results do not preclude SARS-CoV-2 infection, do not rule out co-infections with other pathogens, and should not be used as the sole basis for treatment or other patient management decisions. Negative results must be combined with clinical observations, patient history, and epidemiological information. The expected result is Negative.  Fact Sheet for Patients: SugarRoll.be  Fact Sheet for Healthcare Providers: https://www.woods-mathews.com/  This test is not yet approved or cleared by the [place] FDA and  has been authorized for detection and/or diagnosis of SARS-CoV-2 by FDA under an Emergency Use  Authorization (EUA). This EUA will remain  in effect (meaning this test can be used) for the duration of the COVID-19 declaration under Se ction 564(b)(1) of the Act, 21 U.S.C. section 360bbb-3(b)(1), unless the authorization is terminated or revoked sooner.  Performed at [facility], [place] [address], [place],  [address]      Labs: BNP (last 3 results) No results for input(s): BNP in the last 8760 hours. Basic Metabolic Panel: Recent Labs  Lab 12/31/19 0120 12/31/19 0613 01/01/20 0929 01/02/20 0301 01/02/20 1649 01/04/20 0837  NA 134* 136 137  --   --  133*  K 3.9 3.9 4.3  --   --  3.9  CL 96* 100 102  --   --  93*  CO2 27 28 28   --   --  30  GLUCOSE 109* 108* 108*  --   --  118*  BUN 23 21 17   --   --  17  CREATININE 0.97 1.04* 0.82  --  0.61 0.64  CALCIUM 9.5 9.2  9.0  --   --  9.3  MG  --   --  1.5* 1.8  --  1.7  PHOS  --   --  4.0  --   --  3.1   Liver Function Tests: Recent Labs  Lab 12/31/19 0613 01/01/20 0929 01/04/20 0837  AST 18  --   --   ALT 14  --   --   ALKPHOS 97  --   --   BILITOT 0.4  --   --   PROT 5.8*  --   --   ALBUMIN 3.0* 2.9* 3.3*   No results for input(s): LIPASE, AMYLASE in the last 168 hours. No results for input(s): AMMONIA in the last 168 hours. CBC: Recent Labs  Lab 12/31/19 0120 12/31/19 0120 12/31/19 9528 01/01/20 0929 01/02/20 0301 01/02/20 1649 01/04/20 0837  WBC 7.5  --  5.9 6.4  --  8.9  --   NEUTROABS 4.5  --  3.0  --   --   --   --   HGB 11.5*   < > 9.9* 9.9* 10.9* 10.5* 12.1  HCT 37.0   < > 32.6* 32.3* 34.5* 32.7* 37.0  MCV 95.1  --  96.4 96.7  --  91.9  --   PLT 277  --  264 221  --  229  --    < > = values in this interval not displayed.   Cardiac Enzymes: No results for input(s): CKTOTAL, CKMB, CKMBINDEX, TROPONINI in the last 168 hours. BNP: Invalid input(s): POCBNP CBG: Recent Labs  Lab 01/04/20 1616 01/04/20 2002 01/05/20 0345 01/05/20 2018 01/06/20 0612  GLUCAP 130* 168* 122* 142*  78   D-Dimer No results for input(s): DDIMER in the last 72 hours. Hgb A1c No results for input(s): HGBA1C in the last 72 hours. Lipid Profile No results for input(s): CHOL, HDL, LDLCALC, TRIG, CHOLHDL, LDLDIRECT in the last 72 hours. Thyroid function studies No results for input(s): TSH, T4TOTAL, T3FREE, THYROIDAB in the last 72 hours.  Invalid input(s): FREET3 Anemia work up Recent Labs    01/06/20 0452  VITAMINB12 726  FOLATE 11.4  FERRITIN 180  TIBC 290  IRON 74  RETICCTPCT 2.3   Urinalysis    Component Value Date/Time   COLORURINE YELLOW 02/14/2016 1844   APPEARANCEUR CLEAR 02/14/2016 1844   LABSPEC 1.013 02/14/2016 1844   PHURINE 5.5 02/14/2016 1844   GLUCOSEU NEGATIVE 02/14/2016 1844   HGBUR TRACE (A) 02/14/2016 1844   BILIRUBINUR NEGATIVE 02/14/2016 1844   BILIRUBINUR n 12/06/2010 0000   KETONESUR NEGATIVE 02/14/2016 1844   PROTEINUR NEGATIVE 02/14/2016 1844   UROBILINOGEN 0.2 12/19/2011 1104   NITRITE NEGATIVE 02/14/2016 1844   LEUKOCYTESUR NEGATIVE 02/14/2016 1844   Sepsis Labs Invalid input(s): PROCALCITONIN,  WBC,  LACTICIDVEN   Time coordinating discharge: 35 minutes  SIGNED:  Mercy Riding, MD  [facility] 01/06/2020, 8:33 AM  If 7PM-7AM, please contact night-coverage [email or web address] Password [email or web address]

## 2020-01-10 ENCOUNTER — Ambulatory Visit: Payer: Medicare HMO | Admitting: Psychology

## 2020-01-17 ENCOUNTER — Ambulatory Visit (INDEPENDENT_AMBULATORY_CARE_PROVIDER_SITE_OTHER): Payer: Medicare HMO | Admitting: Psychology

## 2020-01-17 DIAGNOSIS — F4323 Adjustment disorder with mixed anxiety and depressed mood: Secondary | ICD-10-CM

## 2020-01-18 ENCOUNTER — Ambulatory Visit: Payer: Medicare HMO | Admitting: Psychology

## 2020-01-24 ENCOUNTER — Ambulatory Visit (INDEPENDENT_AMBULATORY_CARE_PROVIDER_SITE_OTHER): Payer: Medicare HMO | Admitting: Psychology

## 2020-01-24 DIAGNOSIS — F4323 Adjustment disorder with mixed anxiety and depressed mood: Secondary | ICD-10-CM

## 2020-01-31 ENCOUNTER — Ambulatory Visit: Payer: Medicare HMO | Admitting: Psychology

## 2020-02-01 ENCOUNTER — Ambulatory Visit: Payer: Medicare HMO | Admitting: Psychology

## 2020-02-03 ENCOUNTER — Ambulatory Visit (INDEPENDENT_AMBULATORY_CARE_PROVIDER_SITE_OTHER): Payer: Medicare HMO | Admitting: Psychology

## 2020-02-03 DIAGNOSIS — F4323 Adjustment disorder with mixed anxiety and depressed mood: Secondary | ICD-10-CM | POA: Diagnosis not present

## 2020-02-07 ENCOUNTER — Ambulatory Visit: Payer: Medicare HMO | Admitting: Psychology

## 2020-02-13 ENCOUNTER — Encounter (HOSPITAL_COMMUNITY): Payer: Self-pay

## 2020-02-13 ENCOUNTER — Other Ambulatory Visit: Payer: Self-pay

## 2020-02-13 ENCOUNTER — Emergency Department (HOSPITAL_COMMUNITY)
Admission: EM | Admit: 2020-02-13 | Discharge: 2020-02-14 | Disposition: A | Discharge status: Home / Self Care | Payer: Medicare HMO | Attending: Emergency Medicine | Admitting: Emergency Medicine

## 2020-02-13 ENCOUNTER — Emergency Department (HOSPITAL_COMMUNITY): Payer: Medicare HMO

## 2020-02-13 DIAGNOSIS — M546 Pain in thoracic spine: Secondary | ICD-10-CM | POA: Diagnosis not present

## 2020-02-13 DIAGNOSIS — Y999 Unspecified external cause status: Secondary | ICD-10-CM | POA: Insufficient documentation

## 2020-02-13 DIAGNOSIS — Z79899 Other long term (current) drug therapy: Secondary | ICD-10-CM | POA: Diagnosis not present

## 2020-02-13 DIAGNOSIS — Y939 Activity, unspecified: Secondary | ICD-10-CM | POA: Diagnosis not present

## 2020-02-13 DIAGNOSIS — Z87891 Personal history of nicotine dependence: Secondary | ICD-10-CM | POA: Diagnosis not present

## 2020-02-13 DIAGNOSIS — S22080A Wedge compression fracture of T11-T12 vertebra, initial encounter for closed fracture: Secondary | ICD-10-CM

## 2020-02-13 DIAGNOSIS — S22080D Wedge compression fracture of T11-T12 vertebra, subsequent encounter for fracture with routine healing: Secondary | ICD-10-CM | POA: Diagnosis present

## 2020-02-13 DIAGNOSIS — Y929 Unspecified place or not applicable: Secondary | ICD-10-CM | POA: Diagnosis not present

## 2020-02-13 DIAGNOSIS — I10 Essential (primary) hypertension: Secondary | ICD-10-CM | POA: Diagnosis not present

## 2020-02-13 DIAGNOSIS — W19XXXD Unspecified fall, subsequent encounter: Secondary | ICD-10-CM | POA: Insufficient documentation

## 2020-02-13 DIAGNOSIS — G8929 Other chronic pain: Secondary | ICD-10-CM | POA: Diagnosis not present

## 2020-02-13 DIAGNOSIS — R110 Nausea: Secondary | ICD-10-CM | POA: Insufficient documentation

## 2020-02-13 MED ORDER — HYDRALAZINE HCL 25 MG PO TABS
25.0000 mg | ORAL_TABLET | Freq: Once | ORAL | Status: AC
  Administered 2020-02-13: 25 mg via ORAL
  Filled 2020-02-13: qty 1

## 2020-02-13 MED ORDER — OXYCODONE HCL 5 MG PO TABS
5.0000 mg | ORAL_TABLET | Freq: Once | ORAL | Status: AC
  Administered 2020-02-13: 5 mg via ORAL
  Filled 2020-02-13: qty 1

## 2020-02-13 MED ORDER — ONDANSETRON HCL 4 MG/2ML IJ SOLN
4.0000 mg | Freq: Once | INTRAMUSCULAR | Status: AC
  Administered 2020-02-13: 4 mg via INTRAVENOUS
  Filled 2020-02-13: qty 2

## 2020-02-13 MED ORDER — OXYCODONE HCL 5 MG PO TABS: 5.0000 mg | ORAL_TABLET | Freq: Four times a day (QID) | ORAL | 0 refills | Status: DC | PRN

## 2020-02-13 MED ORDER — ACETAMINOPHEN 325 MG PO TABS
650.0000 mg | ORAL_TABLET | Freq: Once | ORAL | Status: AC
  Administered 2020-02-13: 650 mg via ORAL
  Filled 2020-02-13: qty 2

## 2020-02-13 NOTE — Discharge Instructions (Addendum)
Please follow up with Dr Trenton Gammon as scheduled on Thursday.  You still have a spinal fracture that is likely causing your back pain.  Please wear your back brace whenever you are out of bed.  I recommended that [facility] increase your pain medication.  You can still receive your oxycontin 10 mg long-term release twice a day, but you can also receive 5 mg roxicodone (fast release) every 6 hours as needed.  I spoke to the nurse at [facility] and advised that the doctor at [facility] be contacted to make additional changes to your pain medications.  We do not make long-term changes or prescribe long-term opioids from the ER.

## 2020-02-13 NOTE — ED Notes (Signed)
Pt speaking with daughter at this time.

## 2020-02-13 NOTE — ED Notes (Signed)
Spoke with pt daughter made aware of current BP and discharge plan.

## 2020-02-13 NOTE — ED Provider Notes (Signed)
[facility] EMERGENCY DEPARTMENT Provider Note   CSN: [id number] Arrival date & time: 02/13/20  1123     History Chief Complaint  Patient presents with  . Back Pain    [name] is a 86 y.o. female w/ hx of HTN, HLD, subacute T12 compression fracture after a fall 2 months ago, presenting to the ED from [facility] with worsening back pain.  The patient (and her daughter at bedside and son, Cherlynn Kaiser, by phone, who are her PoA's) report concerns that her pain is not being adequately treated at her nursing home.  She is on Oxycodone ER 10 mg every 12 hours, and tylenol 650 mg every 6 hours, with oxycodone "up to 4 doses PRN" in the daytime.  She gets pain relief in her back immediately after her oxycodone but it tends to wear off.  Her pain is worse with lying down, and better when sitting up and wearing her back brace.  She reports she has not always worn the back brace at times.  She reports her pain has NOT changed since her original injury.  It is in the same location and still radiates at times up and down her back.  Per record review, she was hospitalized for pain control for the same condition in June 2021, at which time she had an MRI performed showing severe T12 compression fracture with osteonecrosis and some retropulsion on the conus medullaris.  She was evaluated by Dr Marcello Moores on 6/13 and Dr [name] who recommended conservative care as they did not feel surgical intervention would be successful.   Per consult note from Dr Marcello Moores on 01/01/20:  "I had a long discussion with the patient as well as a 22 minute discussion with the patient's daughter and son via telephone. Unfortunately, surgical options for treating this fracture are limited.  She is a poor candidate for extensive surgery with vertebrectomy and reconstruction given her age and her bone quality. Even a limited surgery with tamping of bone fragments and decompression would require multilevel instrumentation with  cement augmented pedicle screw/rod construct. Patient was firmly against the idea of any sort of instrumented surgery.  Kyphoplasty would carry some significant risks of intracanalicular cement extravasation, and is very unlikely to help with her leg pain. Given her back pain is actually improving and more mild at this point, I would not recommend it.  I would suggest aggressively treating the patient's neuropathic pain medically with increasing her gabapentin dose, starting a steroid taper, and potentially attempting additional neuropathic pain meds (amitryptiline, duloxetine) as needed.  She will need to continue to wear her TLSO brace when OOB.  The family inquired about disposition options and I told them these will be clarified by the case manager in the upcoming week.  All questions and concerns were answered."  They have an office appointment scheduled with Dr Trenton Gammon, a neurosurgeon, in 3 days upcoming.  HPI     Past Medical History:  Diagnosis Date  . Allergy   . Arthritis   . Depression   . Endometriosis   . GERD (gastroesophageal reflux disease)   . Hearing loss 12/08/2011   bil, hearing aids  . Hyperlipidemia   . Hypertension   . Osteoporosis   . Ovarian cyst   . Polymyalgia rheumatica ([place])   . Post herpetic neuralgia   . S/P right THA, AA 12/16/2011  . Shingles 2020  . Skin cancer   . Spinal stenosis     Patient Active Problem  List   Diagnosis Date Noted  . T12 compression fracture ([place]) 01/01/2020  . Intractable pain 12/31/2019  . Low back pain 12/31/2019  . Palliative care by specialist   . Goals of care, counseling/discussion   . DNR (do not resuscitate)   . Compression fracture of T12 vertebra ([place]) 12/04/2019  . Spinal fracture of T12 vertebra ([place]) 12/04/2019  . Fever, unspecified 02/15/2016  . Anxiety 02/15/2016  . Anemia 02/15/2016  . Hyponatremia 02/15/2016  . Bradycardia   . Constipation   . Depression   . MDD (major depressive disorder), recurrent  severe, without psychosis ([place])   . Symptomatic bradycardia 01/30/2016  . Symptomatic anemia 01/29/2016  . Hypertension   . Osteoporosis   . Hyperlipidemia   . GERD (gastroesophageal reflux disease)   . Spinal stenosis   . Hearing loss 12/08/2011  . Osteoarthritis of hip 12/02/2011  . Endometriosis   . DIVERTICULOSIS-COLON 02/12/2009  . ALLERGIC RHINITIS 01/19/2007  . ESOPHAGEAL STRICTURE 01/19/2007    Past Surgical History:  Procedure Laterality Date  . CATARACT EXTRACTION, BILATERAL  12-08-11   bil  . DILATION AND CURETTAGE OF UTERUS    . HERNIA REPAIR    . HYSTEROSCOPY    . OOPHORECTOMY     bilateral  . STERIOD INJECTION  12/09/11   to spine for rt calf pain and spasms  . TIBIA FRACTURE SURGERY     trauma  . TONSILLECTOMY AND ADENOIDECTOMY    . TOTAL HIP ARTHROPLASTY  12/16/2011   Procedure: TOTAL HIP ARTHROPLASTY ANTERIOR APPROACH;  Surgeon: Mauri Pole, MD;  Location: [facility] ORS;  Service: Orthopedics;  Laterality: Right;     OB History    Gravida  4   Para  4   Term  4   Preterm      AB      Living  2     SAB      TAB      Ectopic      Multiple      Live Births              Family History  Problem Relation Age of Onset  . Stroke Sister   . Diabetes Daughter   . Cancer Father        esophogeal  . Cancer Paternal Aunt        stomach  . Breast cancer Maternal Grandmother        Age 54  . Stroke Mother     Social History   Tobacco Use  . Smoking status: Former Research scientist (life sciences)  . Smokeless tobacco: Never Used  Substance Use Topics  . Alcohol use: No    Alcohol/week: 1.0 standard drink    Types: 1 Standard drinks or equivalent per week  . Drug use: No    Home Medications Prior to Admission medications   Medication Sig Start Date End Date Taking? Authorizing Provider  acetaminophen (TYLENOL) 500 MG tablet Take 2 tablets (1,000 mg total) by mouth every 8 (eight) hours. 01/06/20   Mercy Riding, MD  bisacodyl (DULCOLAX) 5 MG EC tablet Take 1  tablet (5 mg total) by mouth daily as needed for moderate constipation. 02/03/16   Tomma Rakers, MD  calcitonin, salmon, (MIACALCIN/FORTICAL) 200 UNIT/ACT nasal spray Place 1 spray into alternate nostrils daily at 6 PM. 01/06/20   Mercy Riding, MD  Cholecalciferol (VITAMIN D3) 50 MCG (2000 UT) TABS Take 2,000 Units by mouth daily.    [provider]  dexamethasone (  DECADRON) 4 MG tablet Take 1 tablet (4 mg total) by mouth daily. 01/06/20   Mercy Riding, MD  docusate sodium (COLACE) 100 MG capsule Take 2 capsules (200 mg total) by mouth 2 (two) times daily. 01/06/20   Mercy Riding, MD  DULoxetine (CYMBALTA) 30 MG capsule Take 1 capsule (30 mg total) by mouth daily. 01/06/20   Mercy Riding, MD  enoxaparin (LOVENOX) 40 MG/0.4ML injection Inject 0.4 mLs (40 mg total) into the skin daily for 21 days. 01/06/20 01/27/20  Mercy Riding, MD  feeding supplement, ENSURE ENLIVE, (ENSURE ENLIVE) LIQD Take 237 mLs by mouth 2 (two) times daily between meals. Give 2 week supply 02/17/16   Thurnell Lose, MD  gabapentin (NEURONTIN) 300 MG capsule Take 1 capsule (300 mg total) by mouth 2 (two) times daily with breakfast and lunch AND 2 capsules (600 mg total) at bedtime. 01/06/20   Mercy Riding, MD  hydrALAZINE (APRESOLINE) 25 MG tablet Take 1 tablet (25 mg total) by mouth every 8 (eight) hours. 12/09/19 01/08/20  Elodia Florence., MD  losartan (COZAAR) 50 MG tablet Take 50 mg by mouth daily. 10/15/19   [provider]  meloxicam (MOBIC) 7.5 MG tablet Take 7.5 mg by mouth 2 (two) times daily. 11/03/19   [provider]  mirtazapine (REMERON) 15 MG tablet Take 15 mg by mouth at bedtime. 10/07/19   [provider]  omeprazole (PRILOSEC) 20 MG capsule Take 1 capsule (20 mg total) by mouth daily. 08/04/14   Swords, Darrick Penna, MD  oxyCODONE (OXYCONTIN) 10 mg 12 hr tablet Take 1 tablet (10 mg total) by mouth every 12 (twelve) hours. 01/06/20   Mercy Riding, MD  oxyCODONE  (ROXICODONE) 5 MG immediate release tablet Take 1 tablet (5 mg total) by mouth every 6 (six) hours as needed for severe pain. 02/13/20 03/14/20  Wyvonnia Dusky, MD  polyethylene glycol powder (MIRALAX) 17 GM/SCOOP powder Take 17 g by mouth 2 (two) times daily as needed for moderate constipation. 01/06/20   Mercy Riding, MD  senna-docusate (SENOKOT-S) 8.6-50 MG tablet Take 1 tablet by mouth 2 (two) times daily between meals as needed for mild constipation. 01/06/20   Mercy Riding, MD  simvastatin (ZOCOR) 20 MG tablet TAKE 1 TABLET EVERY DAY Patient taking differently: Take 20 mg by mouth every evening.  09/29/13   Swords, Darrick Penna, MD    Allergies    Cartia xt [diltiazem], Codeine, Lisinopril-hydrochlorothiazide, Mercury, Sulfa antibiotics, and Sulfonamide derivatives  Review of Systems   Review of Systems  Constitutional: Negative for chills and fever.  Respiratory: Negative for cough and shortness of breath.   Cardiovascular: Negative for chest pain and palpitations.  Gastrointestinal: Positive for nausea. Negative for abdominal pain and constipation.  Genitourinary: Negative for difficulty urinating and dysuria.  Musculoskeletal: Positive for arthralgias and back pain.  Skin: Negative for color change and rash.  Neurological: Negative for weakness and numbness.  Psychiatric/Behavioral: Negative for agitation and confusion.  All other systems reviewed and are negative.   Physical Exam Updated Vital Signs BP 116/76 (BP Location: Right Arm)   Pulse 70   Temp (!) 97.4 F (36.3 C) (Oral)   Resp 16   Ht 5\' 4"  (1.626 m)   Wt 72.6 kg   SpO2 97%   BMI 27.46 kg/m   Physical Exam Vitals and nursing note reviewed.  Constitutional:      General: She is not in acute distress.  Appearance: She is well-developed.  HENT:     Head: Normocephalic and atraumatic.  Eyes:     Conjunctiva/sclera: Conjunctivae normal.  Cardiovascular:     Rate and Rhythm: Normal rate and regular rhythm.    Pulmonary:     Effort: Pulmonary effort is normal. No respiratory distress.  Musculoskeletal:     Cervical back: Neck supple.  Skin:    General: Skin is warm and dry.  Neurological:     Mental Status: She is alert.     Comments: Sensation in lower extremities intact, strength preserved, no saddle anesthesia  Psychiatric:        Mood and Affect: Mood normal.        Behavior: Behavior normal.     ED Results / Procedures / Treatments   Labs (all labs ordered are listed, but only abnormal results are displayed) Labs Reviewed - No data to display  EKG None  Radiology DG Thoracic Spine 2 View  Result Date: 02/13/2020 CLINICAL DATA:  Thoracic spine pain after fall 2 months ago. EXAM: THORACIC SPINE 2 VIEWS COMPARISON:  MRI and CT scan of December 31, 2019. FINDINGS: Severe compression deformity of T12 vertebral body is noted which is stable compared to prior exam. No spondylolisthesis is noted. IMPRESSION: Stable severe compression deformity of T12 vertebral body consistent with fracture as described on prior exams. No new fracture is noted. Electronically Signed   By: Marijo Conception M.D.   On: 02/13/2020 12:37    Procedures Procedures (including critical care time)  Medications Ordered in ED Medications  ondansetron (ZOFRAN) injection 4 mg (4 mg Intravenous Given 02/13/20 1137)  oxyCODONE (Oxy IR/ROXICODONE) immediate release tablet 5 mg (5 mg Oral Given 02/13/20 1539)  acetaminophen (TYLENOL) tablet 650 mg (650 mg Oral Given 02/13/20 1539)    ED Course  I have reviewed the triage vital signs and the nursing notes.  Pertinent labs & imaging results that were available during my care of the patient were reviewed by me and considered in my medical decision making (see chart for details).  86 yo female presenting to ED with acute on chronic mid-back pain with known severe T12 compression fractures, complicated spinal disease as noted above.  She has no new symptoms but feels her pain  has never been under control at her nursing facility.  She has a NSGY appointment in 3 days.  She had xrays ordered in triage which show the same compression fracture, but as I explained to her and her family, these films are not likely to delineate any further issues with her back.  Because she has no focal neuro complaints, I felt this was largely an issue of adequate pain control.  She has developed a tolerance to opioids, and we'll need to increase her dosing to get her to her appointment on Thursday.  At that time, I advised her family and her to discuss again their options with the neurosurgeon.  I also advised wearing her back brace at all times when out of bed to take some pressure off her spine.   They agree with this plan.  I'll prescribe additional roxycodone 5 mg, to give Q6 inbetween her long-acting doses.  I also called her nurse at [facility] and advised they discuss this with the facility physician, who manages all of her medications.     Final Clinical Impression(s) / ED Diagnoses Final diagnoses:  Compression fracture of T12 vertebra, initial encounter ([place])  Chronic bilateral thoracic back pain  Rx / DC Orders ED Discharge Orders         Ordered    oxyCODONE (ROXICODONE) 5 MG immediate release tablet  Every 6 hours PRN     Discontinue  Reprint     02/13/20 1500           Wyvonnia Dusky, MD 02/13/20 1728

## 2020-02-13 NOTE — ED Notes (Signed)
[name] BP still high EDP notified Called [facility] to ask if patient needs to take BP meds, but no answer

## 2020-02-13 NOTE — ED Notes (Addendum)
[facility] arrived to transport patient. They states their policy says they cannot transport PT with a systolic BP above 469 BP: 184/105  Pt is encouraged to rest and relax [facility] will be called when BP is lower than 507 systolic

## 2020-02-13 NOTE — ED Notes (Signed)
This RN at the phone with pt daughter at this time, pt daughter attempting to connect this RN to pt nurse at [facility]. Speaking with Katharine Look, Med Tech at this time regarding evening antihypertension, medication being faxed to [facility] at this time.

## 2020-02-13 NOTE — ED Notes (Signed)
Pt MAR received via FAX at this time.

## 2020-02-13 NOTE — ED Notes (Signed)
Returned pt daughter phone call per request, provided pt update. Pt stable resting comfortably with eye closed, no acute distress noted.

## 2020-02-13 NOTE — ED Notes (Signed)
Assist patient in and out the bathroom patient is back in the bed with family at bedside

## 2020-02-13 NOTE — ED Triage Notes (Signed)
Presents to ED from [facility] with known T12 Compression fx post fall 2 months ago. Increased pain today. No new injury. Pain 10/10 Received 100 MCG Fentanyl Iv with EMS pta. #[id number]

## 2020-02-13 NOTE — ED Notes (Signed)
Tawanna Solo, RN spoke with daughter regarding contacting [facility] regarding antihypertensive. Kehinde, RN unable to speak to [facility].

## 2020-02-13 NOTE — ED Notes (Signed)
Provider provided [facility] for pt from [facility] at this time, pending medication order at this time.

## 2020-02-14 ENCOUNTER — Ambulatory Visit: Payer: Medicare HMO | Admitting: Psychology

## 2020-02-14 NOTE — ED Notes (Signed)
Pt calling out nurse this RN spoke with pt providing update and offering pt refreshments, blankets and repositioning for pt c comfort. Pt reports having pain to back, back brace in place.

## 2020-02-14 NOTE — ED Notes (Addendum)
Report given to [facility], at bedside for pt transport.

## 2020-02-15 ENCOUNTER — Ambulatory Visit: Payer: Medicare HMO | Admitting: Psychology

## 2020-02-16 ENCOUNTER — Other Ambulatory Visit: Payer: Self-pay | Admitting: Neurosurgery

## 2020-02-17 ENCOUNTER — Other Ambulatory Visit: Payer: Self-pay

## 2020-02-17 ENCOUNTER — Encounter (HOSPITAL_COMMUNITY): Payer: Self-pay | Admitting: Neurosurgery

## 2020-02-17 NOTE — Pre-Procedure Instructions (Signed)
NIVA MURREN  02/17/2020    Ms. Lindeman's procedure is scheduled on Monday, February 20, 2020 at 12 Noon.   Report to [facility] Entrance "A" Admitting Office   Call this number if you have problems the morning of surgery: [phone]   Remember:  Ms. Handley is not to eat or drink after midnight Sunday, 02/19/20.  Take these medicines the morning of surgery with A SIP OF WATER: Duloxetine (Cymbalta), Gabapentin (Neurontin), Hydralazine (Apresoline), Omeprazole (Prilosec), Oxycontin, Acetaminophen (Tylenol)  Hold Meloxicam (Mobic) as of today prior to surgery.  Per order Dr. Roberts Gaudy, Anesthesiologist/[place] Head, RN    Do not wear jewelry, make-up or nail polish.  Do not wear lotions, powders, perfumes or deodorant.  Do not shave 48 hours prior to surgery.    Do not bring valuables to the hospital.  [facility] is not responsible for any belongings or valuables.  Contacts, dentures or bridgework may not be worn into surgery.  Leave your suitcase in the car.  After surgery it may be brought to your room.  For patients admitted to the hospital, discharge time will be determined by your treatment team.  Patients discharged the day of surgery will not be allowed to drive home.

## 2020-02-17 NOTE — Progress Notes (Signed)
Pt is a resident at [facility]. I spoke with [name], Med tech and she is to fax me the [facility] for pt. She states she needs a signed order from the anesthesiologist about what medications are to be held, what medications that can be given morning of surgery and when to be NPO. Dr. Linna Caprice signed the instructions/orders and I faxed them to [name] at [phone]. I also spoke with [name], pt's daughter and reviewed pt's medical history. Pt is in a lot of pain and daughter prefers to have pt get her Covid test day of surgery. Pt is to arrive at 9 AM to have Covid test done. Pre-op instructions given to [facility] also. She voiced understanding.

## 2020-02-20 ENCOUNTER — Ambulatory Visit (HOSPITAL_COMMUNITY): Payer: Medicare HMO | Admitting: Anesthesiology

## 2020-02-20 ENCOUNTER — Other Ambulatory Visit: Payer: Self-pay

## 2020-02-20 ENCOUNTER — Ambulatory Visit (HOSPITAL_COMMUNITY): Payer: Medicare HMO

## 2020-02-20 ENCOUNTER — Observation Stay (HOSPITAL_COMMUNITY)
Admission: RE | Admit: 2020-02-20 | Discharge: 2020-02-21 | Disposition: A | Discharge status: Home / Self Care | Payer: Medicare HMO | Attending: Neurosurgery | Admitting: Neurosurgery

## 2020-02-20 ENCOUNTER — Encounter (HOSPITAL_COMMUNITY)
Admission: RE | Disposition: A | Discharge status: Home / Self Care | Payer: Self-pay | Source: Home / Self Care | Attending: Neurosurgery

## 2020-02-20 ENCOUNTER — Encounter (HOSPITAL_COMMUNITY): Payer: Self-pay | Admitting: Neurosurgery

## 2020-02-20 DIAGNOSIS — Y9389 Activity, other specified: Secondary | ICD-10-CM | POA: Diagnosis not present

## 2020-02-20 DIAGNOSIS — Z419 Encounter for procedure for purposes other than remedying health state, unspecified: Secondary | ICD-10-CM

## 2020-02-20 DIAGNOSIS — Z96641 Presence of right artificial hip joint: Secondary | ICD-10-CM | POA: Diagnosis not present

## 2020-02-20 DIAGNOSIS — W1830XA Fall on same level, unspecified, initial encounter: Secondary | ICD-10-CM | POA: Insufficient documentation

## 2020-02-20 DIAGNOSIS — S3992XA Unspecified injury of lower back, initial encounter: Secondary | ICD-10-CM | POA: Diagnosis present

## 2020-02-20 DIAGNOSIS — Y999 Unspecified external cause status: Secondary | ICD-10-CM | POA: Insufficient documentation

## 2020-02-20 DIAGNOSIS — Y929 Unspecified place or not applicable: Secondary | ICD-10-CM | POA: Insufficient documentation

## 2020-02-20 DIAGNOSIS — S22080G Wedge compression fracture of T11-T12 vertebra, subsequent encounter for fracture with delayed healing: Secondary | ICD-10-CM

## 2020-02-20 DIAGNOSIS — Z20822 Contact with and (suspected) exposure to covid-19: Secondary | ICD-10-CM | POA: Diagnosis not present

## 2020-02-20 DIAGNOSIS — I10 Essential (primary) hypertension: Secondary | ICD-10-CM | POA: Insufficient documentation

## 2020-02-20 DIAGNOSIS — Z79899 Other long term (current) drug therapy: Secondary | ICD-10-CM | POA: Diagnosis not present

## 2020-02-20 DIAGNOSIS — Z87891 Personal history of nicotine dependence: Secondary | ICD-10-CM | POA: Diagnosis not present

## 2020-02-20 DIAGNOSIS — S22089A Unspecified fracture of T11-T12 vertebra, initial encounter for closed fracture: Secondary | ICD-10-CM | POA: Diagnosis not present

## 2020-02-20 DIAGNOSIS — S22080A Wedge compression fracture of T11-T12 vertebra, initial encounter for closed fracture: Secondary | ICD-10-CM | POA: Diagnosis present

## 2020-02-20 DIAGNOSIS — Z85828 Personal history of other malignant neoplasm of skin: Secondary | ICD-10-CM | POA: Diagnosis not present

## 2020-02-20 HISTORY — PX: KYPHOPLASTY: SHX5884

## 2020-02-20 HISTORY — DX: Cardiac murmur, unspecified: R01.1

## 2020-02-20 LAB — BASIC METABOLIC PANEL
Anion gap: 12 (ref 5–15)
BUN: 40 mg/dL — ABNORMAL HIGH (ref 8–23)
CO2: 27 mmol/L (ref 22–32)
Calcium: 10.1 mg/dL (ref 8.9–10.3)
Chloride: 98 mmol/L (ref 98–111)
Creatinine, Ser: 1.19 mg/dL — ABNORMAL HIGH (ref 0.44–1.00)
GFR calc Af Amer: 45 mL/min — ABNORMAL LOW (ref >60)
GFR calc non Af Amer: 39 mL/min — ABNORMAL LOW (ref >60)
Glucose, Bld: 121 mg/dL — ABNORMAL HIGH (ref 70–99)
Potassium: 3.9 mmol/L (ref 3.5–5.1)
Sodium: 137 mmol/L (ref 135–145)

## 2020-02-20 LAB — CBC
HCT: 38.1 % (ref 36.0–46.0)
Hemoglobin: 11.7 g/dL — ABNORMAL LOW (ref 12.0–15.0)
MCH: 30.2 pg (ref 26.0–34.0)
MCHC: 30.7 g/dL (ref 30.0–36.0)
MCV: 98.4 fL (ref 80.0–100.0)
Platelets: 294 10*3/uL (ref 150–400)
RBC: 3.87 MIL/uL (ref 3.87–5.11)
RDW: 14.1 % (ref 11.5–15.5)
WBC: 11.4 10*3/uL — ABNORMAL HIGH (ref 4.0–10.5)
nRBC: 0 % (ref 0.0–0.2)

## 2020-02-20 LAB — SURGICAL PCR SCREEN
MRSA, PCR: NEGATIVE
Staphylococcus aureus: NEGATIVE

## 2020-02-20 LAB — SARS CORONAVIRUS 2 BY RT PCR (HOSPITAL ORDER, PERFORMED IN ~~LOC~~ HOSPITAL LAB): SARS Coronavirus 2: NEGATIVE

## 2020-02-20 SURGERY — KYPHOPLASTY
Anesthesia: General | Site: Spine Thoracic

## 2020-02-20 MED ORDER — BUPIVACAINE HCL (PF) 0.25 % IJ SOLN
INTRAMUSCULAR | Status: AC
  Filled 2020-02-20: qty 30

## 2020-02-20 MED ORDER — FENTANYL CITRATE (PF) 100 MCG/2ML IJ SOLN: 25.0000 ug | INTRAMUSCULAR | Status: DC | PRN

## 2020-02-20 MED ORDER — ROCURONIUM BROMIDE 10 MG/ML (PF) SYRINGE
PREFILLED_SYRINGE | INTRAVENOUS | Status: DC | PRN
  Administered 2020-02-20: 40 mg via INTRAVENOUS

## 2020-02-20 MED ORDER — SODIUM CHLORIDE 0.9% FLUSH
3.0000 mL | Freq: Two times a day (BID) | INTRAVENOUS | Status: DC
  Administered 2020-02-20: 3 mL via INTRAVENOUS

## 2020-02-20 MED ORDER — HYDRALAZINE HCL 25 MG PO TABS
25.0000 mg | ORAL_TABLET | Freq: Three times a day (TID) | ORAL | Status: DC
  Administered 2020-02-20 – 2020-02-21 (×2): 25 mg via ORAL
  Filled 2020-02-20 (×4): qty 1

## 2020-02-20 MED ORDER — PROPOFOL 10 MG/ML IV BOLUS
INTRAVENOUS | Status: DC | PRN
  Administered 2020-02-20: 80 mg via INTRAVENOUS

## 2020-02-20 MED ORDER — PHENOL 1.4 % MT LIQD: 1.0000 | OROMUCOSAL | Status: DC | PRN

## 2020-02-20 MED ORDER — CHLORHEXIDINE GLUCONATE CLOTH 2 % EX PADS: 6.0000 | MEDICATED_PAD | Freq: Once | CUTANEOUS | Status: DC

## 2020-02-20 MED ORDER — CEFAZOLIN SODIUM-DEXTROSE 2-4 GM/100ML-% IV SOLN
2.0000 g | INTRAVENOUS | Status: AC
  Administered 2020-02-20: 2 g via INTRAVENOUS

## 2020-02-20 MED ORDER — ONDANSETRON HCL 4 MG/2ML IJ SOLN
INTRAMUSCULAR | Status: DC | PRN
  Administered 2020-02-20: 4 mg via INTRAVENOUS

## 2020-02-20 MED ORDER — LACTATED RINGERS IV SOLN: INTRAVENOUS | Status: DC

## 2020-02-20 MED ORDER — FENTANYL CITRATE (PF) 250 MCG/5ML IJ SOLN
INTRAMUSCULAR | Status: DC | PRN
  Administered 2020-02-20: 50 ug via INTRAVENOUS

## 2020-02-20 MED ORDER — DULOXETINE HCL 30 MG PO CPEP
30.0000 mg | ORAL_CAPSULE | Freq: Every day | ORAL | Status: DC
  Administered 2020-02-21: 30 mg via ORAL
  Filled 2020-02-20: qty 1

## 2020-02-20 MED ORDER — EPHEDRINE SULFATE 50 MG/ML IJ SOLN
INTRAMUSCULAR | Status: DC | PRN
  Administered 2020-02-20: 10 mg via INTRAVENOUS

## 2020-02-20 MED ORDER — FENTANYL CITRATE (PF) 250 MCG/5ML IJ SOLN
INTRAMUSCULAR | Status: AC
  Filled 2020-02-20: qty 5

## 2020-02-20 MED ORDER — PHENYLEPHRINE HCL-NACL 10-0.9 MG/250ML-% IV SOLN
INTRAVENOUS | Status: DC | PRN
  Administered 2020-02-20: 25 ug/min via INTRAVENOUS

## 2020-02-20 MED ORDER — DEXAMETHASONE 4 MG PO TABS
4.0000 mg | ORAL_TABLET | Freq: Every day | ORAL | Status: DC
  Administered 2020-02-21: 4 mg via ORAL
  Filled 2020-02-20: qty 1

## 2020-02-20 MED ORDER — AMISULPRIDE (ANTIEMETIC) 5 MG/2ML IV SOLN: 10.0000 mg | Freq: Once | INTRAVENOUS | Status: DC | PRN

## 2020-02-20 MED ORDER — GABAPENTIN 300 MG PO CAPS
300.0000 mg | ORAL_CAPSULE | Freq: Two times a day (BID) | ORAL | Status: DC
  Administered 2020-02-20 – 2020-02-21 (×2): 300 mg via ORAL
  Filled 2020-02-20 (×2): qty 1

## 2020-02-20 MED ORDER — OXYCODONE HCL ER 10 MG PO T12A
10.0000 mg | EXTENDED_RELEASE_TABLET | Freq: Two times a day (BID) | ORAL | Status: DC
  Administered 2020-02-20 – 2020-02-21 (×2): 10 mg via ORAL
  Filled 2020-02-20 (×2): qty 1

## 2020-02-20 MED ORDER — CYCLOBENZAPRINE HCL 10 MG PO TABS: 10.0000 mg | ORAL_TABLET | Freq: Three times a day (TID) | ORAL | Status: DC | PRN

## 2020-02-20 MED ORDER — CEFAZOLIN SODIUM-DEXTROSE 1-4 GM/50ML-% IV SOLN
1.0000 g | Freq: Three times a day (TID) | INTRAVENOUS | Status: AC
  Administered 2020-02-20 – 2020-02-21 (×2): 1 g via INTRAVENOUS
  Filled 2020-02-20 (×2): qty 50

## 2020-02-20 MED ORDER — DEXAMETHASONE SODIUM PHOSPHATE 10 MG/ML IJ SOLN
INTRAMUSCULAR | Status: DC | PRN
  Administered 2020-02-20: 10 mg via INTRAVENOUS

## 2020-02-20 MED ORDER — MIRTAZAPINE 15 MG PO TABS
15.0000 mg | ORAL_TABLET | Freq: Every day | ORAL | Status: DC
  Administered 2020-02-20: 15 mg via ORAL
  Filled 2020-02-20 (×2): qty 1

## 2020-02-20 MED ORDER — LIDOCAINE 2% (20 MG/ML) 5 ML SYRINGE
INTRAMUSCULAR | Status: AC
  Filled 2020-02-20: qty 5

## 2020-02-20 MED ORDER — MENTHOL 3 MG MT LOZG: 1.0000 | LOZENGE | OROMUCOSAL | Status: DC | PRN

## 2020-02-20 MED ORDER — LOSARTAN POTASSIUM 50 MG PO TABS
50.0000 mg | ORAL_TABLET | Freq: Every day | ORAL | Status: DC
  Administered 2020-02-21: 50 mg via ORAL
  Filled 2020-02-20: qty 1

## 2020-02-20 MED ORDER — ACETAMINOPHEN 500 MG PO TABS
1000.0000 mg | ORAL_TABLET | Freq: Three times a day (TID) | ORAL | Status: DC
  Administered 2020-02-20 – 2020-02-21 (×2): 1000 mg via ORAL
  Filled 2020-02-20 (×2): qty 2

## 2020-02-20 MED ORDER — MELOXICAM 7.5 MG PO TABS
7.5000 mg | ORAL_TABLET | Freq: Two times a day (BID) | ORAL | Status: DC
  Administered 2020-02-20 – 2020-02-21 (×2): 7.5 mg via ORAL
  Filled 2020-02-20 (×3): qty 1

## 2020-02-20 MED ORDER — ACETAMINOPHEN 650 MG RE SUPP: 650.0000 mg | RECTAL | Status: DC | PRN

## 2020-02-20 MED ORDER — ORAL CARE MOUTH RINSE: 15.0000 mL | Freq: Once | OROMUCOSAL | Status: AC

## 2020-02-20 MED ORDER — ONDANSETRON HCL 4 MG/2ML IJ SOLN: 4.0000 mg | Freq: Four times a day (QID) | INTRAMUSCULAR | Status: DC | PRN

## 2020-02-20 MED ORDER — PANTOPRAZOLE SODIUM 40 MG PO TBEC
40.0000 mg | DELAYED_RELEASE_TABLET | Freq: Every day | ORAL | Status: DC
  Administered 2020-02-21: 40 mg via ORAL
  Filled 2020-02-20: qty 1

## 2020-02-20 MED ORDER — SENNOSIDES-DOCUSATE SODIUM 8.6-50 MG PO TABS: 1.0000 | ORAL_TABLET | Freq: Two times a day (BID) | ORAL | Status: DC | PRN

## 2020-02-20 MED ORDER — SODIUM CHLORIDE 0.9% FLUSH: 3.0000 mL | INTRAVENOUS | Status: DC | PRN

## 2020-02-20 MED ORDER — CHLORHEXIDINE GLUCONATE 0.12 % MT SOLN: 15.0000 mL | Freq: Once | OROMUCOSAL | Status: AC

## 2020-02-20 MED ORDER — ACETAMINOPHEN 325 MG PO TABS
650.0000 mg | ORAL_TABLET | ORAL | Status: DC | PRN
  Administered 2020-02-20: 650 mg via ORAL
  Filled 2020-02-20: qty 2

## 2020-02-20 MED ORDER — HYDROMORPHONE HCL 1 MG/ML IJ SOLN
1.0000 mg | INTRAMUSCULAR | Status: DC | PRN
  Administered 2020-02-20: 1 mg via INTRAVENOUS
  Filled 2020-02-20: qty 1

## 2020-02-20 MED ORDER — SUGAMMADEX SODIUM 200 MG/2ML IV SOLN
INTRAVENOUS | Status: DC | PRN
  Administered 2020-02-20: 180 mg via INTRAVENOUS
  Administered 2020-02-20: 100 mg via INTRAVENOUS

## 2020-02-20 MED ORDER — PHENYLEPHRINE 40 MCG/ML (10ML) SYRINGE FOR IV PUSH (FOR BLOOD PRESSURE SUPPORT)
PREFILLED_SYRINGE | INTRAVENOUS | Status: AC
  Filled 2020-02-20: qty 10

## 2020-02-20 MED ORDER — PREDNISONE 5 MG PO TABS
5.0000 mg | ORAL_TABLET | Freq: Every day | ORAL | Status: DC
  Administered 2020-02-21: 5 mg via ORAL
  Filled 2020-02-20: qty 1

## 2020-02-20 MED ORDER — PROPOFOL 10 MG/ML IV BOLUS
INTRAVENOUS | Status: AC
  Filled 2020-02-20: qty 20

## 2020-02-20 MED ORDER — DOCUSATE SODIUM 100 MG PO CAPS
200.0000 mg | ORAL_CAPSULE | Freq: Two times a day (BID) | ORAL | Status: DC
  Administered 2020-02-20: 200 mg via ORAL
  Filled 2020-02-20: qty 2

## 2020-02-20 MED ORDER — GABAPENTIN 300 MG PO CAPS: 300.0000 mg | ORAL_CAPSULE | Freq: Three times a day (TID) | ORAL | Status: DC

## 2020-02-20 MED ORDER — ONDANSETRON HCL 4 MG PO TABS: 4.0000 mg | ORAL_TABLET | Freq: Four times a day (QID) | ORAL | Status: DC | PRN

## 2020-02-20 MED ORDER — FENTANYL CITRATE (PF) 100 MCG/2ML IJ SOLN: 25.0000 ug | Freq: Once | INTRAMUSCULAR | Status: AC

## 2020-02-20 MED ORDER — CHLORHEXIDINE GLUCONATE 0.12 % MT SOLN
OROMUCOSAL | Status: AC
  Administered 2020-02-20: 15 mL via OROMUCOSAL
  Filled 2020-02-20: qty 15

## 2020-02-20 MED ORDER — VITAMIN D 25 MCG (1000 UNIT) PO TABS
2000.0000 [IU] | ORAL_TABLET | Freq: Every day | ORAL | Status: DC
  Administered 2020-02-21: 2000 [IU] via ORAL
  Filled 2020-02-20: qty 2

## 2020-02-20 MED ORDER — IOPAMIDOL (ISOVUE-300) INJECTION 61%
INTRAVENOUS | Status: DC | PRN
  Administered 2020-02-20: 50 mL

## 2020-02-20 MED ORDER — ENSURE ENLIVE PO LIQD
237.0000 mL | Freq: Two times a day (BID) | ORAL | Status: DC
  Administered 2020-02-20 – 2020-02-21 (×2): 237 mL via ORAL
  Filled 2020-02-20 (×3): qty 237

## 2020-02-20 MED ORDER — LIDOCAINE 2% (20 MG/ML) 5 ML SYRINGE
INTRAMUSCULAR | Status: DC | PRN
  Administered 2020-02-20: 80 mg via INTRAVENOUS

## 2020-02-20 MED ORDER — CALCITONIN (SALMON) 200 UNIT/ACT NA SOLN
1.0000 | Freq: Every day | NASAL | Status: DC
  Administered 2020-02-20: 1 via NASAL
  Filled 2020-02-20: qty 3.7

## 2020-02-20 MED ORDER — FENTANYL CITRATE (PF) 100 MCG/2ML IJ SOLN
INTRAMUSCULAR | Status: AC
  Administered 2020-02-20: 50 ug via INTRAVENOUS
  Filled 2020-02-20: qty 2

## 2020-02-20 MED ORDER — PHENYLEPHRINE 40 MCG/ML (10ML) SYRINGE FOR IV PUSH (FOR BLOOD PRESSURE SUPPORT)
PREFILLED_SYRINGE | INTRAVENOUS | Status: DC | PRN
  Administered 2020-02-20: 160 ug via INTRAVENOUS

## 2020-02-20 MED ORDER — SODIUM CHLORIDE 0.9 % IV SOLN: 250.0000 mL | INTRAVENOUS | Status: DC

## 2020-02-20 MED ORDER — CEFAZOLIN SODIUM-DEXTROSE 2-4 GM/100ML-% IV SOLN
INTRAVENOUS | Status: AC
  Filled 2020-02-20: qty 100

## 2020-02-20 MED ORDER — DEXAMETHASONE SODIUM PHOSPHATE 10 MG/ML IJ SOLN
INTRAMUSCULAR | Status: AC
  Filled 2020-02-20: qty 1

## 2020-02-20 MED ORDER — OXYCODONE HCL 5 MG PO TABS
5.0000 mg | ORAL_TABLET | Freq: Four times a day (QID) | ORAL | Status: DC | PRN
  Administered 2020-02-20 – 2020-02-21 (×2): 5 mg via ORAL
  Filled 2020-02-20 (×2): qty 1

## 2020-02-20 MED ORDER — FUROSEMIDE 20 MG PO TABS
20.0000 mg | ORAL_TABLET | Freq: Every day | ORAL | Status: DC
  Administered 2020-02-20: 20 mg via ORAL
  Filled 2020-02-20: qty 1

## 2020-02-20 MED ORDER — SIMVASTATIN 20 MG PO TABS
20.0000 mg | ORAL_TABLET | Freq: Every evening | ORAL | Status: DC
  Administered 2020-02-20: 20 mg via ORAL
  Filled 2020-02-20: qty 1

## 2020-02-20 MED ORDER — BISACODYL 5 MG PO TBEC: 5.0000 mg | DELAYED_RELEASE_TABLET | Freq: Every day | ORAL | Status: DC | PRN

## 2020-02-20 MED ORDER — ONDANSETRON HCL 4 MG/2ML IJ SOLN
INTRAMUSCULAR | Status: AC
  Filled 2020-02-20: qty 2

## 2020-02-20 MED ORDER — BUPIVACAINE HCL (PF) 0.25 % IJ SOLN
INTRAMUSCULAR | Status: DC | PRN
  Administered 2020-02-20: 5 mL

## 2020-02-20 MED ORDER — ARTIFICIAL TEARS OPHTHALMIC OINT
TOPICAL_OINTMENT | OPHTHALMIC | Status: AC
  Filled 2020-02-20: qty 3.5

## 2020-02-20 MED ORDER — ROCURONIUM BROMIDE 10 MG/ML (PF) SYRINGE
PREFILLED_SYRINGE | INTRAVENOUS | Status: AC
  Filled 2020-02-20: qty 10

## 2020-02-20 MED ORDER — NITROFURANTOIN MONOHYD MACRO 100 MG PO CAPS: 100.0000 mg | ORAL_CAPSULE | Freq: Two times a day (BID) | ORAL | Status: DC

## 2020-02-20 MED ORDER — POLYETHYLENE GLYCOL 3350 17 GM/SCOOP PO POWD
17.0000 g | Freq: Two times a day (BID) | ORAL | Status: DC | PRN
  Filled 2020-02-20: qty 255

## 2020-02-20 SURGICAL SUPPLY — 31 items
BLADE SURG 15 STRL LF DISP TIS (BLADE) ×1 IMPLANT
BLADE SURG 15 STRL SS (BLADE) ×2
BNDG ADH 1X3 SHEER STRL LF (GAUZE/BANDAGES/DRESSINGS) ×2 IMPLANT
CEMENT KYPHON C01A KIT/MIXER (Cement) ×2 IMPLANT
DERMABOND ADVANCED (GAUZE/BANDAGES/DRESSINGS) ×1
DERMABOND ADVANCED .7 DNX12 (GAUZE/BANDAGES/DRESSINGS) ×1 IMPLANT
DRAPE C-ARM 42X72 X-RAY (DRAPES) ×2 IMPLANT
DRAPE HALF SHEET 40X57 (DRAPES) ×2 IMPLANT
DRAPE INCISE IOBAN 66X45 STRL (DRAPES) ×2 IMPLANT
DRAPE LAPAROTOMY 100X72X124 (DRAPES) ×2 IMPLANT
DRAPE WARM FLUID 44X44 (DRAPES) ×2 IMPLANT
DURAPREP 26ML APPLICATOR (WOUND CARE) ×2 IMPLANT
GAUZE 4X4 16PLY RFD (DISPOSABLE) ×2 IMPLANT
GLOVE ECLIPSE 9.0 STRL (GLOVE) ×2 IMPLANT
GOWN STRL REUS W/ TWL LRG LVL3 (GOWN DISPOSABLE) IMPLANT
GOWN STRL REUS W/ TWL XL LVL3 (GOWN DISPOSABLE) ×1 IMPLANT
GOWN STRL REUS W/TWL 2XL LVL3 (GOWN DISPOSABLE) ×4 IMPLANT
GOWN STRL REUS W/TWL LRG LVL3 (GOWN DISPOSABLE)
GOWN STRL REUS W/TWL XL LVL3 (GOWN DISPOSABLE) ×2
KIT BASIN OR (CUSTOM PROCEDURE TRAY) ×2 IMPLANT
KIT TURNOVER KIT B (KITS) ×2 IMPLANT
NEEDLE HYPO 25X1 1.5 SAFETY (NEEDLE) ×2 IMPLANT
NS IRRIG 1000ML POUR BTL (IV SOLUTION) ×2 IMPLANT
PACK EENT II TURBAN DRAPE (CUSTOM PROCEDURE TRAY) ×2 IMPLANT
STAPLER SKIN PROX WIDE 3.9 (STAPLE) ×2 IMPLANT
SUT VIC AB 2-0 CP2 18 (SUTURE) IMPLANT
SUT VIC AB 3-0 SH 8-18 (SUTURE) IMPLANT
SYR CONTROL 10ML LL (SYRINGE) ×2 IMPLANT
TOWEL GREEN STERILE (TOWEL DISPOSABLE) ×2 IMPLANT
TOWEL GREEN STERILE FF (TOWEL DISPOSABLE) ×2 IMPLANT
TRAY KYPHOPAK 15/3 ONESTEP 1ST (MISCELLANEOUS) ×2 IMPLANT

## 2020-02-20 NOTE — Transfer of Care (Signed)
Immediate Anesthesia Transfer of Care Note  Patient: [name]  Procedure(s) Performed: THORACIC TWELVE KYPHOPLASTY (N/A Spine Thoracic)  Patient Location: PACU  Anesthesia Type:General  Level of Consciousness: drowsy  Airway & Oxygen Therapy: Patient Spontanous Breathing and Patient connected to face mask oxygen  Post-op Assessment: Report given to RN and Post -op Vital signs reviewed and stable  Post vital signs: Reviewed and stable  Last Vitals:  Vitals Value Taken Time  BP 160/105   Temp    Pulse 68 02/20/20 1335  Resp 14 02/20/20 1335  SpO2 100 % 02/20/20 1335  Vitals shown include unvalidated device data.  Last Pain:  Vitals:   02/20/20 1050  TempSrc:   PainSc: 10-Worst pain ever      Patients Stated Pain Goal: 3 (73/41/93 7902)  Complications: No complications documented.

## 2020-02-20 NOTE — Evaluation (Signed)
Physical Therapy Evaluation Patient Details Name: [name] MRN: [id number] DOB: [birth year] Today's Date: 02/20/2020   History of Present Illness  Pt is a 86 y.o. female s/p T12 kyphoplasty. She had a fall sustaining T12 compression fx. PMH includes HTN, OA, R THA depression.  Clinical Impression  Pt was evaluated for the above diagnosis and the impairments listed below. Pt presented with cognitive deficits and yelling throughout session, even if she wasn't moving. Pt required +2 mod assist for all bed mobility. Further mobility limited secondary to pain. Pt was educated on spinal precautions. Pt was admitted from ALF and recommend HHPT if ALF able to provide necessary assist. If not, pt would benefit from SNF level therapy. Pt would continue to benefit from acute therapy services in order to ensure safety with functional tasks. Will continue to follow acutely.     Follow Up Recommendations Supervision/Assistance - 24 hour;Home health PT (If ALF can provide necessary assist. If not, will need SNF )    Equipment Recommendations  None recommended by PT    Recommendations for Other Services       Precautions / Restrictions Precautions Precautions: Back Precaution Booklet Issued: Yes (comment) Precaution Comments: Reviewed back precautions with pt. Pt able to recall with minimal cues.  Required Braces or Orthoses: Spinal Brace Spinal Brace: Lumbar corset Restrictions Weight Bearing Restrictions: No      Mobility  Bed Mobility Overal bed mobility: Needs Assistance Bed Mobility: Rolling;Sidelying to Sit;Sit to Sidelying Rolling: Mod assist;+2 for physical assistance Sidelying to sit: Mod assist;+2 for physical assistance;HOB elevated     Sit to sidelying: Mod assist;+2 for physical assistance;HOB elevated General bed mobility comments: pt required +2 moderate assist for assist with rolling, trunk elevation and LE assist. Also required multimodal cuing for bed mobility. Further  mobility deferred as pt yelling out in pain   Transfers         Ambulation/Gait          Stairs         Wheelchair Mobility    Modified Rankin (Stroke Patients Only)       Balance Overall balance assessment: Needs assistance Sitting-balance support: Bilateral upper extremity supported;Feet unsupported Sitting balance-Leahy Scale: Poor Sitting balance - Comments: pt was able to maintain sitting balance with min guard assist; however, pt was reliant on bed railing for UE support.              Pertinent Vitals/Pain Pain Assessment: Faces Faces Pain Scale: Hurts worst Pain Location: back Pain Descriptors / Indicators: Grimacing;Operative site guarding;Moaning (yelling ) Pain Intervention(s): Limited activity within patient's tolerance;Monitored during session;RN gave pain meds during session    [place] expects to be discharged to:: Assisted living               Home Equipment: Wheelchair - manual      Prior Function Level of Independence: Needs assistance   Gait / Transfers Assistance Needed: Assume pt was using WC, as WC in room, however, unsure given pt's cognitive deficits. Pt reports staff assisted with all mobility tasks.   ADL's / Homemaking Assistance Needed: Staff assist with ADLs        Hand Dominance   Dominant Hand: Right    Extremity/Trunk Assessment   Upper Extremity Assessment Upper Extremity Assessment: Generalized weakness    Lower Extremity Assessment Lower Extremity Assessment: Generalized weakness    Cervical / Trunk Assessment Cervical / Trunk Assessment: Other exceptions Cervical / Trunk Exceptions: s/p kyphoplasty  Communication  Communication: HOH  Cognition Arousal/Alertness: Awake/alert Behavior During Therapy: Restless (Simultaneous filing. User may not have seen previous data.) Overall Cognitive Status: No family/caregiver present to determine baseline cognitive functioning Area of  Impairment: Attention;Orientation;Following commands;Awareness;Problem solving;Memory;Safety/judgement                 Orientation Level: Disoriented to;Place;Time (Simultaneous filing. User may not have seen previous data.) Current Attention Level: Sustained (Simultaneous filing. User may not have seen previous data.) Memory: Decreased short-term memory Following Commands: Follows one step commands with increased time Safety/Judgement: Decreased awareness of deficits Awareness: Intellectual Problem Solving: Slow processing;Requires verbal cues;Requires tactile cues;Difficulty sequencing;Decreased initiation General Comments: pt was noted to recall spinal precautions but unable to remember date and where she was located. Pt demonstrated inconsistent pain presentation as evident by alternation of yelling without movement. Pt noted to have hyperalgesia with light touch in toes and arms. Pt very anxious to move.       General Comments      Exercises     Assessment/Plan    PT Assessment Patient needs continued PT services  PT Problem List Decreased strength;Decreased activity tolerance;Decreased range of motion;Decreased balance;Decreased mobility;Decreased coordination;Decreased cognition;Decreased knowledge of use of DME;Decreased safety awareness;Decreased knowledge of precautions;Pain       PT Treatment Interventions DME instruction;Gait training;Stair training;Functional mobility training;Therapeutic activities;Balance training;Therapeutic exercise;Neuromuscular re-education;Cognitive remediation;Patient/family education;Modalities;Wheelchair mobility training    PT Goals (Current goals can be found in the Care Plan section)  Acute Rehab PT Goals Patient Stated Goal: be pain free PT Goal Formulation: With patient Time For Goal Achievement: 03/05/20 Potential to Achieve Goals: Fair    Frequency Min 5X/week   Barriers to discharge        Co-evaluation                AM-PAC PT "6 Clicks" Mobility  Outcome Measure Help needed turning from your back to your side while in a flat bed without using bedrails?: A Little Help needed moving from lying on your back to sitting on the side of a flat bed without using bedrails?: A Little Help needed moving to and from a bed to a chair (including a wheelchair)?: A Lot Help needed standing up from a chair using your arms (e.g., wheelchair or bedside chair)?: A Lot Help needed to walk in hospital room?: A Lot Help needed climbing 3-5 steps with a railing? : A Lot 6 Click Score: 14    End of Session   Activity Tolerance: Patient limited by pain Patient left: in bed;with call Elsye Mccollister/phone within reach Nurse Communication: Mobility status PT Visit Diagnosis: Pain;Muscle weakness (generalized) (M62.81) Pain - part of body:  (low back)    Time: 8786-7672 PT Time Calculation (min) (ACUTE ONLY): 19 min   Charges:   PT Evaluation $PT Eval Moderate Complexity: 1 Mod         Gloriann Loan, SPT  [place]  Office: [phone]  02/20/2020, 6:40 PM

## 2020-02-20 NOTE — Anesthesia Preprocedure Evaluation (Addendum)
Anesthesia Evaluation  Patient identified by MRN, date of birth, ID band Patient awake and Patient confused    Reviewed: Allergy & Precautions, NPO status , Patient's Chart, lab work & pertinent test results  Airway Mallampati: II  TM Distance: >3 FB Neck ROM: Full    Dental  (+) Dental Advisory Given   Pulmonary former smoker,    breath sounds clear to auscultation       Cardiovascular hypertension, Pt. on medications + Valvular Problems/Murmurs AS  Rhythm:Regular Rate:Normal     Neuro/Psych negative neurological ROS     GI/Hepatic Neg liver ROS, GERD  ,  Endo/Other  negative endocrine ROS  Renal/GU negative Renal ROS     Musculoskeletal  (+) Arthritis ,   Abdominal   Peds  Hematology  (+) anemia ,   Anesthesia Other Findings   Reproductive/Obstetrics                            Anesthesia Physical Anesthesia Plan  ASA: III  Anesthesia Plan: General   Post-op Pain Management:    Induction: Intravenous  PONV Risk Score and Plan: 3 and Dexamethasone, Ondansetron and Treatment may vary due to age or medical condition  Airway Management Planned: Oral ETT  Additional Equipment: None  Intra-op Plan:   Post-operative Plan: Extubation in OR  Informed Consent: I have reviewed the patients History and Physical, chart, labs and discussed the procedure including the risks, benefits and alternatives for the proposed anesthesia with the patient or authorized representative who has indicated his/her understanding and acceptance.     Dental advisory given  Plan Discussed with: CRNA  Anesthesia Plan Comments:         Anesthesia Quick Evaluation

## 2020-02-20 NOTE — Op Note (Signed)
Date of procedure: 02/20/2020  Date of dictation: Same  Service: Neurosurgery  Preoperative diagnosis: Severe T12 osteoporotic compression fracture with kyphosis and stenosis  Postoperative diagnosis: Same  Procedure Name: T12 percutaneous kyphoplasty with methylmethacrylate  Surgeon:Miyana Mordecai A.Jazzlynn Rawe, M.D.  Asst. Surgeon: None  Anesthesia: General  Indication: 86 year old female approximately 6 weeks status post fall with resultant severe back pain with bilateral lower extremity symptoms.  Work-up demonstrates evidence of a severe osteoporotic compression fracture of T12 with some retropulsed bone into the spinal canal with significant stenosis.  Patient is failed conservative management and presents now for kyphoplasty.  Operative note: After induction anesthesia, patient edition prone onto bolsters and appropriately padded.  Patient's thoracic region was prepped and draped sterilely.  On the left side entry point was localized and a stab incision was made overlying the left-sided T12 pedicle.  A Jamshidi needle was then introduced into the left T12 pedicle and passed into the vertebral body under fluoroscopic guidance.  Good position was confirmed in both AP and lateral planes.  A pilot hole was drilled within the vertebral body for balloon placement.  Kyphoplasty balloon was inserted and inflated with some fracture reduction and gain and overall height.  Methylmethacrylate was mixed.  Methylmethacrylate was then injected under direct fluoroscopic visualization into the vertebral body of T12.  7 and half cc of methylmethacrylate was injected into the vertebral body and allowed to harden.  There was no evidence of any significant extravasation into the disc space.  There was no intravascular spread.  Most importantly there was no evidence of any spread dorsally toward the spinal canal.  The Jamshidi needle was removed.  Wound was closed.  Sterile dressing was applied.  No apparent complications.

## 2020-02-20 NOTE — Brief Op Note (Signed)
02/20/2020  1:17 PM  PATIENT:  [name]  86 y.o. female  PRE-OPERATIVE DIAGNOSIS:  Compression fracture  POST-OPERATIVE DIAGNOSIS:  * No post-op diagnosis entered *  PROCEDURE:  Procedure(s) with comments: T-12 KYPHOPLASTY (N/A) - 3C  SURGEON:  Surgeon(s) and Role:    * Earnie Larsson, MD - Primary  PHYSICIAN ASSISTANT:   ASSISTANTS:    ANESTHESIA:   general  EBL:  Minimal    BLOOD ADMINISTERED:none  DRAINS: none   LOCAL MEDICATIONS USED:  MARCAINE     SPECIMEN:  No Specimen  DISPOSITION OF SPECIMEN:  N/A  COUNTS:  YES  TOURNIQUET:  * No tourniquets in log *  DICTATION: .Dragon Dictation  PLAN OF CARE: Admit for overnight observation  PATIENT DISPOSITION:  PACU - hemodynamically stable.   Delay start of Pharmacological VTE agent (>24hrs) due to surgical blood loss or risk of bleeding: yes

## 2020-02-20 NOTE — Anesthesia Postprocedure Evaluation (Signed)
Anesthesia Post Note  Patient: [name]  Procedure(s) Performed: THORACIC TWELVE KYPHOPLASTY (N/A Spine Thoracic)     Patient location during evaluation: PACU Anesthesia Type: General Level of consciousness: awake and alert Pain management: pain level controlled Vital Signs Assessment: post-procedure vital signs reviewed and stable Respiratory status: spontaneous breathing, nonlabored ventilation, respiratory function stable and patient connected to nasal cannula oxygen Cardiovascular status: blood pressure returned to baseline and stable Postop Assessment: no apparent nausea or vomiting Anesthetic complications: no   No complications documented.  Last Vitals:  Vitals:   02/20/20 1540 02/20/20 1929  BP: (!) 172/75 (!) 104/58  Pulse: 80 66  Resp: 16 18  Temp: 36.7 C 36.6 C  SpO2: 95% 92%    Last Pain:  Vitals:   02/20/20 1930  TempSrc:   PainSc: 4                  Rhayne Chatwin DAVID

## 2020-02-20 NOTE — Anesthesia Procedure Notes (Signed)
Procedure Name: Intubation Date/Time: 02/20/2020 12:40 PM Performed by: Bryson Corona, CRNA Pre-anesthesia Checklist: Patient identified, Emergency Drugs available, Suction available and Patient being monitored Patient Re-evaluated:Patient Re-evaluated prior to induction Oxygen Delivery Method: Circle System Utilized Preoxygenation: Pre-oxygenation with 100% oxygen Induction Type: IV induction Ventilation: Oral airway inserted - appropriate to patient size Laryngoscope Size: Glidescope and 3 Grade View: Grade I Tube type: Oral Tube size: 7.0 mm Number of attempts: 1 Airway Equipment and Method: Stylet and Oral airway Placement Confirmation: ETT inserted through vocal cords under direct vision,  positive ETCO2 and breath sounds checked- equal and bilateral Secured at: 22 cm Tube secured with: Tape Dental Injury: Teeth and Oropharynx as per pre-operative assessment  Difficulty Due To: Difficult Airway- due to reduced neck mobility Comments: Pt unable to extend neck and having neck pain.

## 2020-02-20 NOTE — Progress Notes (Signed)
Patient resides at [facility]. Will continue to follow for discharge needs.   Cami Delawder LCSW

## 2020-02-20 NOTE — Progress Notes (Signed)
Pt c/o severe lower back pain radiating down both legs upon arrival to Short Stay for surgery. Dr. Deatra Canter from anesthesia made aware. Dr. Ola Spurr evaluated the pt at the bedside, and a verbal order received for pain medication. After pain medication given approx 1.5 hours after, the pt became confused to time and situation. Surgeon Dr. Amado Coe and anesthesiologist Dr. Deatra Canter at the bedside. [name] son Cyanne Delmar brought back to the treatment area to verify surgery and cosign the surgical consent.

## 2020-02-20 NOTE — H&P (Signed)
[name] is an 86 y.o. female.   Chief Complaint: Back pain HPI: 86 year old female suffered a fall approximately 6 weeks ago with resultant severe T12 compression fracture with marked retropulsion and stenosis.  Patient has been treated conservatively with bracing and light activity level with minimal success.  She continues to have severe back pain worsened with movement.  She has shooting pains into her posterior lower extremities.  She has advanced age, poor health, and extremely poor bone quality making open decompression and fusion surgery impractical.  We are moving forward with T12 kyphoplasty in hopes of salvaging her situation.  Past Medical History:  Diagnosis Date  . Allergy   . Arthritis   . Depression   . Endometriosis   . GERD (gastroesophageal reflux disease)   . Hearing loss 12/08/2011   bil, hearing aids  . Heart murmur   . Hyperlipidemia   . Hypertension   . Osteoporosis   . Ovarian cyst   . Polymyalgia rheumatica ([place])   . Post herpetic neuralgia   . S/P right THA, AA 12/16/2011  . Shingles 2020  . Skin cancer   . Spinal stenosis     Past Surgical History:  Procedure Laterality Date  . CATARACT EXTRACTION, BILATERAL  12-08-11   bil  . DILATION AND CURETTAGE OF UTERUS    . HERNIA REPAIR    . HYSTEROSCOPY    . OOPHORECTOMY     bilateral  . STERIOD INJECTION  12/09/11   to spine for rt calf pain and spasms  . TIBIA FRACTURE SURGERY     trauma  . TONSILLECTOMY AND ADENOIDECTOMY    . TOTAL HIP ARTHROPLASTY  12/16/2011   Procedure: TOTAL HIP ARTHROPLASTY ANTERIOR APPROACH;  Surgeon: Mauri Pole, MD;  Location: [facility] ORS;  Service: Orthopedics;  Laterality: Right;    Family History  Problem Relation Age of Onset  . Stroke Sister   . Diabetes Daughter   . Cancer Father        esophogeal  . Cancer Paternal Aunt        stomach  . Breast cancer Maternal Grandmother        Age 39  . Stroke Mother    Social History:  reports that she has quit smoking.  She has never used smokeless tobacco. She reports that she does not drink alcohol and does not use drugs.  Allergies:  Allergies  Allergen Reactions  . Cartia Xt [Diltiazem] Other (See Comments)    BRADYCARDIA IN 40'S  . Codeine Other (See Comments)    syncope  . Lisinopril-Hydrochlorothiazide Cough  . Mercury Rash  . Sulfa Antibiotics   . Sulfonamide Derivatives Rash    swelling    Medications Prior to Admission  Medication Sig Dispense Refill  . acetaminophen (TYLENOL) 500 MG tablet Take 2 tablets (1,000 mg total) by mouth every 8 (eight) hours. 30 tablet 0  . bisacodyl (DULCOLAX) 5 MG EC tablet Take 1 tablet (5 mg total) by mouth daily as needed for moderate constipation. 30 tablet 0  . calcitonin, salmon, (MIACALCIN/FORTICAL) 200 UNIT/ACT nasal spray Place 1 spray into alternate nostrils daily at 6 PM. 3.7 mL 12  . Cholecalciferol (VITAMIN D3) 50 MCG (2000 UT) TABS Take 2,000 Units by mouth daily.    Marland Kitchen dexamethasone (DECADRON) 4 MG tablet Take 1 tablet (4 mg total) by mouth daily. 3 tablet 0  . docusate sodium (COLACE) 100 MG capsule Take 2 capsules (200 mg total) by mouth 2 (two) times daily. [place]  capsule 0  . DULoxetine (CYMBALTA) 30 MG capsule Take 1 capsule (30 mg total) by mouth daily. 90 capsule 1  . feeding supplement, ENSURE ENLIVE, (ENSURE ENLIVE) LIQD Take 237 mLs by mouth 2 (two) times daily between meals. Give 2 week supply 237 mL 0  . furosemide (LASIX) 20 MG tablet Take 20 mg by mouth daily.    Marland Kitchen gabapentin (NEURONTIN) 300 MG capsule Take 1 capsule (300 mg total) by mouth 2 (two) times daily with breakfast and lunch AND 2 capsules (600 mg total) at bedtime.    Marland Kitchen losartan (COZAAR) 50 MG tablet Take 50 mg by mouth daily.    . meloxicam (MOBIC) 7.5 MG tablet Take 7.5 mg by mouth 2 (two) times daily.    . mirtazapine (REMERON) 15 MG tablet Take 15 mg by mouth at bedtime.    . nitrofurantoin, macrocrystal-monohydrate, (MACROBID) 100 MG capsule Take 100 mg by mouth 2 (two)  times daily.    Marland Kitchen omeprazole (PRILOSEC) 20 MG capsule Take 1 capsule (20 mg total) by mouth daily. 90 capsule 0  . oxyCODONE (OXYCONTIN) 10 mg 12 hr tablet Take 1 tablet (10 mg total) by mouth every 12 (twelve) hours. 60 tablet 0  . oxyCODONE (ROXICODONE) 5 MG immediate release tablet Take 1 tablet (5 mg total) by mouth every 6 (six) hours as needed for severe pain. 30 tablet 0  . polyethylene glycol powder (MIRALAX) 17 GM/SCOOP powder Take 17 g by mouth 2 (two) times daily as needed for moderate constipation. 255 g 0  . predniSONE (DELTASONE) 5 MG tablet Take 5 mg by mouth daily.    Marland Kitchen senna-docusate (SENOKOT-S) 8.6-50 MG tablet Take 1 tablet by mouth 2 (two) times daily between meals as needed for mild constipation. 60 tablet 0  . simvastatin (ZOCOR) 20 MG tablet TAKE 1 TABLET EVERY DAY (Patient taking differently: Take 20 mg by mouth every evening. ) 90 tablet 0  . enoxaparin (LOVENOX) 40 MG/0.4ML injection Inject 0.4 mLs (40 mg total) into the skin daily for 21 days.    . hydrALAZINE (APRESOLINE) 25 MG tablet Take 1 tablet (25 mg total) by mouth every 8 (eight) hours. 90 tablet 0  . NARCAN 4 MG/0.1ML LIQD nasal spray kit 1 spray once.      Results for orders placed or performed during the hospital encounter of 02/20/20 (from the past 48 hour(s))  SARS Coronavirus 2 by RT PCR (hospital order, performed in [facility] hospital lab) Nasopharyngeal Nasopharyngeal Swab     Status: None   Collection Time: 02/20/20  9:11 AM   Specimen: Nasopharyngeal Swab  Result Value Ref Range   SARS Coronavirus 2 NEGATIVE NEGATIVE    Comment: (NOTE) SARS-CoV-2 target nucleic acids are NOT DETECTED.  The SARS-CoV-2 RNA is generally detectable in upper and lower respiratory specimens during the acute phase of infection. The lowest concentration of SARS-CoV-2 viral copies this assay can detect is 250 copies / mL. A negative result does not preclude SARS-CoV-2 infection and should not be used as the sole basis  for treatment or other patient management decisions.  A negative result may occur with improper specimen collection / handling, submission of specimen other than nasopharyngeal swab, presence of viral mutation(s) within the areas targeted by this assay, and inadequate number of viral copies (<250 copies / mL). A negative result must be combined with clinical observations, patient history, and epidemiological information.  Fact Sheet for Patients:   StrictlyIdeas.no  Fact Sheet for Healthcare Providers: BankingDealers.co.za  This  test is not yet approved or  cleared by the [place] and has been authorized for detection and/or diagnosis of SARS-CoV-2 by FDA under an Emergency Use Authorization (EUA).  This EUA will remain in effect (meaning this test can be used) for the duration of the COVID-19 declaration under Section 564(b)(1) of the Act, 21 U.S.C. section 360bbb-3(b)(1), unless the authorization is terminated or revoked sooner.  Performed at [facility], [place] [address] [place], [place] [address]   Basic metabolic panel per protocol     Status: Abnormal   Collection Time: 02/20/20 10:02 AM  Result Value Ref Range   Sodium 137 135 - 145 mmol/L   Potassium 3.9 3.5 - 5.1 mmol/L   Chloride 98 98 - 111 mmol/L   CO2 27 22 - 32 mmol/L   Glucose, Bld 121 (H) 70 - 99 mg/dL    Comment: Glucose reference range applies only to samples taken after fasting for at least 8 hours.   BUN 40 (H) 8 - 23 mg/dL   Creatinine, Ser 1.19 (H) 0.44 - 1.00 mg/dL   Calcium 10.1 8.9 - 10.3 mg/dL   GFR calc non Af Amer 39 (L) >60 mL/min   GFR calc Af Amer 45 (L) >60 mL/min   Anion gap 12 5 - 15    Comment: Performed at [place] [address] [place], [place] [address]  CBC per protocol     Status: Abnormal   Collection Time: 02/20/20 10:02 AM  Result Value Ref Range   WBC 11.4 (H) 4.0 - 10.5 K/uL   RBC 3.87 3.87 - 5.11 MIL/uL    Hemoglobin 11.7 (L) 12.0 - 15.0 g/dL   HCT 38.1 36 - 46 %   MCV 98.4 80.0 - 100.0 fL   MCH 30.2 26.0 - 34.0 pg   MCHC 30.7 30.0 - 36.0 g/dL   RDW 14.1 11.5 - 15.5 %   Platelets 294 150 - 400 K/uL   nRBC 0.0 0.0 - 0.2 %    Comment: Performed at [facility], [place] [address] [place],  [address]   No results found.  A comprehensive review of systems was negative.  Blood pressure (!) 145/66, pulse 70, temperature 98.2 F (36.8 C), temperature source Oral, resp. rate 14, height _0  (1.626 m), weight 72.6 kg, SpO2 100 %.  Patient is awake and alert.  She is oriented and reasonably appropriate.  She is obviously a great deal of pain.  Examination head ears eyes and throat is unremarkable her chest and abdomen are benign.  Extremities are free from injury or deformity.  Examination neurologically reveals good motor strength in both lower extremities somewhat limited by pain in terms of full examination.  She does have pain with straight leg raising.  She has a relative sensory level around T12. Assessment/Plan T12 osteoporotic compression fracture with severe stenosis.  Plan percutaneous kyphoplasty at T12 in hopes of improving her symptoms.  Risks and benefits of explained.  Patient wishes to proceed.  Mallie Mussel A Jarelly Rinck 02/20/2020, 12:16 PM

## 2020-02-21 ENCOUNTER — Ambulatory Visit: Payer: Medicare HMO | Admitting: Psychology

## 2020-02-21 ENCOUNTER — Encounter (HOSPITAL_COMMUNITY): Payer: Self-pay | Admitting: Neurosurgery

## 2020-02-21 NOTE — Discharge Summary (Signed)
Physician Discharge Summary  Patient ID: [name] MRN: [id number] DOB/AGE: [birth year] 86 y.o.  Admit date: 02/20/2020 Discharge date: 02/21/2020  Admission Diagnoses:  Discharge Diagnoses:  Active Problems:   T12 compression fracture [facility])   Discharged Condition: good  Hospital Course: Patient admitted to the hospital where she underwent uncomplicated U44 kyphoplasty vertebroplasty.  Postop Lee her back pain is much improved.  She still having some lower extremity symptoms but these also appear improved.  She was able to walk a little bit with therapy today.  She is sitting upright with minimal discomfort currently.  Patient ready for discharge back to her care facility.  Consults:   Significant Diagnostic Studies:   Treatments:   Discharge Exam: Blood pressure 139/78, pulse 71, temperature 97.6 F (36.4 C), resp. rate 16, height _0  (1.626 m), weight 72.6 kg, SpO2 96 %. Awake and alert.  Oriented and appropriate.  Motor and sensory function stable.  Wound clean and dry.  Chest and abdomen benign.  Disposition: Discharge disposition: 01-Home or Self Care        Allergies as of 02/21/2020      Reactions   Cartia Xt [diltiazem] Other (See Comments)   BRADYCARDIA IN 40'S   Codeine Other (See Comments)   syncope   Lisinopril-hydrochlorothiazide Cough   Mercury Rash   Sulfa Antibiotics    Sulfonamide Derivatives Rash   swelling      Medication List    TAKE these medications   acetaminophen 500 MG tablet Commonly known as: TYLENOL Take 2 tablets (1,000 mg total) by mouth every 8 (eight) hours.   bisacodyl 5 MG EC tablet Commonly known as: DULCOLAX Take 1 tablet (5 mg total) by mouth daily as needed for moderate constipation.   calcitonin (salmon) 200 UNIT/ACT nasal spray Commonly known as: MIACALCIN/FORTICAL Place 1 spray into alternate nostrils daily at 6 PM.   dexamethasone 4 MG tablet Commonly known as: DECADRON Take 1 tablet (4 mg total) by mouth  daily.   docusate sodium 100 MG capsule Commonly known as: COLACE Take 2 capsules (200 mg total) by mouth 2 (two) times daily.   DULoxetine 30 MG capsule Commonly known as: CYMBALTA Take 1 capsule (30 mg total) by mouth daily.   enoxaparin 40 MG/0.4ML injection Commonly known as: LOVENOX Inject 0.4 mLs (40 mg total) into the skin daily for 21 days.   feeding supplement (ENSURE ENLIVE) Liqd Take 237 mLs by mouth 2 (two) times daily between meals. Give 2 week supply   furosemide 20 MG tablet Commonly known as: LASIX Take 20 mg by mouth daily.   gabapentin 300 MG capsule Commonly known as: NEURONTIN Take 1 capsule (300 mg total) by mouth 2 (two) times daily with breakfast and lunch AND 2 capsules (600 mg total) at bedtime.   hydrALAZINE 25 MG tablet Commonly known as: APRESOLINE Take 1 tablet (25 mg total) by mouth every 8 (eight) hours.   losartan 50 MG tablet Commonly known as: COZAAR Take 50 mg by mouth daily.   meloxicam 7.5 MG tablet Commonly known as: MOBIC Take 7.5 mg by mouth 2 (two) times daily.   mirtazapine 15 MG tablet Commonly known as: REMERON Take 15 mg by mouth at bedtime.   Narcan 4 MG/0.1ML Liqd nasal spray kit Generic drug: naloxone 1 spray once.   nitrofurantoin (macrocrystal-monohydrate) 100 MG capsule Commonly known as: MACROBID Take 100 mg by mouth 2 (two) times daily.   omeprazole 20 MG capsule Commonly known as: PRILOSEC Take 1 capsule (  20 mg total) by mouth daily.   oxyCODONE 10 mg 12 hr tablet Commonly known as: OXYCONTIN Take 1 tablet (10 mg total) by mouth every 12 (twelve) hours.   oxyCODONE 5 MG immediate release tablet Commonly known as: Roxicodone Take 1 tablet (5 mg total) by mouth every 6 (six) hours as needed for severe pain.   polyethylene glycol powder 17 GM/SCOOP powder Commonly known as: MiraLax Take 17 g by mouth 2 (two) times daily as needed for moderate constipation.   predniSONE 5 MG tablet Commonly known as:  DELTASONE Take 5 mg by mouth daily.   senna-docusate 8.6-50 MG tablet Commonly known as: Senokot-S Take 1 tablet by mouth 2 (two) times daily between meals as needed for mild constipation.   simvastatin 20 MG tablet Commonly known as: ZOCOR TAKE 1 TABLET EVERY DAY What changed: when to take this   Vitamin D3 50 MCG (2000 UT) Tabs Take 2,000 Units by mouth daily.        Signed: Cooper Render Rayann Jolley 02/21/2020, 9:18 AM

## 2020-02-21 NOTE — Plan of Care (Signed)
Patient alert and oriented, mae's well, voiding adequate amount of urine, swallowing without difficulty,  c/o mild pain at time of discharge. Discharged instructions given to patient. Patient and family stated understanding of instructions given. Patient'd tranported to facilty with discharged instructions given. Patient has an appointment with Dr. [name]

## 2020-02-21 NOTE — TOC Transition Note (Signed)
Transition of Care [facility]) - CM/SW Discharge Note   Patient Details  Name: [name] MRN: [id number] Date of Birth: [birth year]  Transition of [facility] CM/SW Contact:  Benard Halsted, LCSW Phone Number: 02/21/2020, 12:00 PM   Clinical Narrative:    [facility] Anticipated DC date: 02/21/20 Family notified: Son Chief Financial Officer Transport by: [facility] Lucianne Lei 12:30pm   Per MD patient ready for DC to ALF. RN, patient, patient's family, and facility notified of DC. Discharge Summary sent to facility. RN to call report prior to discharge ([phone]).   CSW will sign off for now as social work intervention is no longer needed. Please consult [place] again if new needs arise.      Final next level of care: Assisted Living Barriers to Discharge: No Barriers Identified   Patient Goals and CMS Choice   CMS Medicare.gov Compare Post Acute Care list provided to:: Patient Represenative (must comment) (Son) Choice offered to / list presented to : Adult Children  Discharge Placement                Patient to be transferred to facility by: [facility] Name of family member notified: Son Patient and family notified of of transfer: 02/21/20  Discharge Plan and Services In-house Referral: Clinical Social Work   Post Acute Care Choice: Resumption of Svcs/PTA Provider                               Social Determinants of Health (SDOH) Interventions     Readmission Risk Interventions Readmission Risk Prevention Plan 01/03/2020  Post Dischage Appt Complete  Medication Screening Complete  Transportation Screening Complete  Some recent data might be hidden

## 2020-02-21 NOTE — Discharge Instructions (Signed)
Wound Care Keep incision dry and clean  Do not put any creams, lotions, or ointments on incision.  Activity Walk each and every day, increasing distance each day. No lifting greater than 5 lbs.  Avoid excessive back bending If provided with back brace, wear when out of bed.  It is not necessary to wear brace in bed.  Diet Resume your normal diet.    Call Your Doctor If Any of These Occur Redness, drainage, or swelling at the wound.  Temperature greater than 101 degrees. Severe pain not relieved by pain medication. Incision starts to come apart. Follow Up Appt Call today for appointment in 1-2 weeks ([phone]) unless appointment was schedule already or for problems.

## 2020-02-21 NOTE — Evaluation (Signed)
Occupational Therapy Evaluation Patient Details Name: [name] MRN: [id number] DOB: 10/25/1922 Today's Date: 02/21/2020    History of Present Illness Pt is a 86 y.o. female s/p T12 kyphoplasty. She had a fall sustaining T12 compression fx. PMH includes HTN, OA, R THA depression.   Clinical Impression   Pt PTA: Pt living at [facility] cared for by staff and mostly using w/c for mobility. Pt requiring ADL assist by staff. Pt currently requiring increased assist for self care and mobility due to increased pain in low back, decreased strength and decreased activity tolerance. Pt set-upA to maxA for ADL tasks; modA+2 for transfers and taking steps in room. Pt with decreased cognition in safety, memory and awareness. Back handout provided and reviewed ADL in detail. Pt educated on: clothing between brace, never sleep in brace, avoid sitting for long periods of time, correct bed positioning for sleeping, correct sequence for bed mobility, avoiding lifting more than 5 pounds and never wash directly over incision. All education is complete and patient indicates understanding. Pt unable to recall precautions from PT session yesterday. Pt re-educated on them.  Pt will require continued OT skilled services in next setting. OT signing off.      Follow Up Recommendations  Supervision/Assistance - 24 hour;Follow surgeon's recommendation for DC plan and follow-up therapies (return to ALF/SNF with therapies to continue)    Equipment Recommendations  None recommended by OT    Recommendations for Other Services       Precautions / Restrictions Precautions Precautions: Back Precaution Booklet Issued: Yes (comment) Precaution Comments: Reviewed back precautions with pt. Pt repeatedly asking "what happened to me?" No recall of precautions to date. Required Braces or Orthoses: Spinal Brace Spinal Brace: Lumbar corset Restrictions Weight Bearing Restrictions: No      Mobility Bed  Mobility Overal bed mobility: Needs Assistance Bed Mobility: Rolling;Sidelying to Sit;Sit to Sidelying Rolling: Min assist;+2 for physical assistance Sidelying to sit: HOB elevated;Min assist;+2 for physical assistance       General bed mobility comments: Pt requiring assist for positioning trunk and BLEs  Transfers Overall transfer level: Needs assistance Equipment used: 2 person hand held assist Transfers: Sit to/from [facility] Sit to Stand: Mod assist;+2 physical assistance;+2 safety/equipment Stand pivot transfers: Mod assist;+2 physical assistance;+2 safety/equipment       General transfer comment: Assist for hand placement, to take larger steps and to avoid plopping.     Balance Overall balance assessment: Needs assistance Sitting-balance support: Bilateral upper extremity supported;Feet unsupported Sitting balance-Leahy Scale: Poor Sitting balance - Comments: MinA for initial stability; minguardA to supervisionA for ADL tasks at EOB;pt stead on commode with BUEs supported by grab bar and sink   Standing balance support: Bilateral upper extremity supported Standing balance-Leahy Scale: Poor Standing balance comment: stability of modA +2 to take steps                           ADL either performed or assessed with clinical judgement   ADL Overall ADL's : Needs assistance/impaired Eating/Feeding: Set up;Sitting   Grooming: Set up;Sitting Grooming Details (indicate cue type and reason): Pt brushing teeth with materials provided seated in W/C  Upper Body Bathing: Minimal assistance;Sitting   Lower Body Bathing: Maximal assistance;Cueing for safety;Sitting/lateral leans;Sit to/from stand   Upper Body Dressing : Moderate assistance;Sitting   Lower Body Dressing: Maximal assistance;Sitting/lateral leans;Sit to/from stand;Cueing for safety   Toilet Transfer: Moderate assistance;+2 for physical assistance;+2 for safety/equipment;Ambulation;Grab  bars Toilet Transfer Details (indicate cue type and reason): +2 assist for safe descent Toileting- Clothing Manipulation and Hygiene: Maximal assistance;Sitting/lateral lean;Sit to/from stand;+2 for physical assistance Toileting - Clothing Manipulation Details (indicate cue type and reason): +2 for stability and for pericare     Functional mobility during ADLs: Moderate assistance;+2 for physical assistance;+2 for safety/equipment (hand held assist) General ADL Comments: Pt requiring increased assist for self care and mobility due to increased pain in low back, decreased strength and decreased activity tolerance.     Vision Baseline Vision/History: No visual deficits;Wears glasses Wears Glasses: At all times Patient Visual Report: No change from baseline Vision Assessment?: No apparent visual deficits     Perception     Praxis      Pertinent Vitals/Pain Pain Assessment: Faces Faces Pain Scale: Hurts whole lot Pain Location: back Pain Descriptors / Indicators: Grimacing;Operative site guarding;Moaning (yelling ) Pain Intervention(s): Limited activity within patient's tolerance;Repositioned     Hand Dominance Right   Extremity/Trunk Assessment Upper Extremity Assessment Upper Extremity Assessment: Overall WFL for tasks assessed   Lower Extremity Assessment Lower Extremity Assessment: Generalized weakness   Cervical / Trunk Assessment Cervical / Trunk Assessment: Other exceptions Cervical / Trunk Exceptions: s/p kyphoplasty   Communication Communication Communication: HOH   Cognition Arousal/Alertness: Awake/alert Behavior During Therapy: Restless Overall Cognitive Status: No family/caregiver present to determine baseline cognitive functioning Area of Impairment: Attention;Orientation;Following commands;Awareness;Problem solving;Memory;Safety/judgement                 Orientation Level: Disoriented to;Place;Time Current Attention Level: Sustained Memory:  Decreased short-term memory;Decreased recall of precautions Following Commands: Follows one step commands with increased time Safety/Judgement: Decreased awareness of deficits;Decreased awareness of safety Awareness: Intellectual Problem Solving: Slow processing;Requires verbal cues;Requires tactile cues;Difficulty sequencing;Decreased initiation General Comments: Pt groaning in pain, but easily redirected with other tasks. Pt sitting in chair abruptly displaying decresed safety awareness; pt's HOH status, makes tasks difficult at times. Pt with confusion early in session when asking multiple times "where am I?" later, pt stating "I am 86 years old. I just had a birthday." Pt requires motivation cues to participate.   General Comments       Exercises     Shoulder Instructions      Home Living Family/patient expects to be discharged to:: Assisted living                             Home Equipment: Wheelchair - manual          Prior Functioning/Environment Level of Independence: Needs assistance  Gait / Transfers Assistance Needed: Assume pt was using WC, as WC in room, however, unsure given pt's cognitive deficits. Pt reports staff assisted with all mobility tasks.  ADL's / Homemaking Assistance Needed: Staff assist with ADLs            OT Problem List: Decreased strength;Decreased activity tolerance;Impaired balance (sitting and/or standing);Decreased coordination;Decreased cognition;Decreased safety awareness;Decreased knowledge of use of DME or AE;Impaired UE functional use;Pain;Increased edema;Cardiopulmonary status limiting activity;Decreased knowledge of precautions      OT Treatment/Interventions:      OT Goals(Current goals can be found in the care plan section) Acute Rehab OT Goals Patient Stated Goal: be pain free OT Goal Formulation: With patient Time For Goal Achievement: 03/06/20 Potential to Achieve Goals: Good  OT Frequency:     Barriers to D/C:             Co-evaluation  AM-PAC OT "6 Clicks" Daily Activity     Outcome Measure Help from another person eating meals?: A Little Help from another person taking care of personal grooming?: A Little Help from another person toileting, which includes using toliet, bedpan, or urinal?: A Lot Help from another person bathing (including washing, rinsing, drying)?: A Lot Help from another person to put on and taking off regular upper body clothing?: A Little Help from another person to put on and taking off regular lower body clothing?: A Lot 6 Click Score: 15   End of Session Equipment Utilized During Treatment: Back brace Nurse Communication: Mobility status;Precautions  Activity Tolerance: Patient limited by pain Patient left: in chair;with call bell/phone within reach  OT Visit Diagnosis: Unsteadiness on feet (R26.81);Muscle weakness (generalized) (M62.81);Pain;Other symptoms and signs involving cognitive function Pain - part of body:  (back)                Time: 0938-1829 OT Time Calculation (min): 33 min Charges:  OT General Charges $OT Visit: 1 Visit OT Evaluation $OT Eval Moderate Complexity: 1 Mod  Jefferey Pica, OTR/L Acute Rehabilitation Services Pager: [phone] Office: [place] C 02/21/2020, 9:35 AM

## 2020-02-21 NOTE — Progress Notes (Signed)
Physical Therapy Treatment Patient Details Name: [name] MRN: [id number] DOB: [birth year] Today's Date: 02/21/2020    History of Present Illness Pt is a 86 y.o. female s/p T12 kyphoplasty. She had a fall sustaining T12 compression fx. PMH includes HTN, OA, R THA depression.    PT Comments    Pt progressing well with post-op mobility. She was able to demonstrate transfers and ambulation with up to +2 min assist (bilateral HHA provided in room). Pt easily redirected and did fairly well when distracted by conversation, otherwise tearful and calling out due to reported pain. Pt was educated on precautions, brace application/wearing schedule, appropriate activity progression, and car transfer. Will continue to follow.      Follow Up Recommendations  Supervision/Assistance - 24 hour;SNF (If ALF can provide necessary assist. If not, will need SNF )     Equipment Recommendations  None recommended by PT    Recommendations for Other Services       Precautions / Restrictions Precautions Precautions: Back Precaution Booklet Issued: Yes (comment) Precaution Comments: Reviewed back precautions with pt. Pt repeatedly asking "what happened to me?" No recall of precautions to date. Required Braces or Orthoses: Spinal Brace Spinal Brace: Lumbar corset Restrictions Weight Bearing Restrictions: No    Mobility  Bed Mobility Overal bed mobility: Needs Assistance Bed Mobility: Rolling;Sidelying to Sit;Sit to Sidelying Rolling: Min assist;+2 for physical assistance Sidelying to sit: HOB elevated;Min assist;+2 for physical assistance       General bed mobility comments: Pt requiring assist for positioning trunk and BLEs  Transfers Overall transfer level: Needs assistance Equipment used: 2 person hand held assist Transfers: Sit to/from [facility] Sit to Stand: Mod assist;+2 physical assistance;+2 safety/equipment Stand pivot transfers: Mod assist;+2 physical assistance;+2  safety/equipment       General transfer comment: Assist for hand placement, to take larger steps and to avoid plopping.   Ambulation/Gait Ambulation/Gait assistance: Min assist;+2 physical assistance;+2 safety/equipment Gait Distance (Feet): 25 Feet Assistive device: 2 person hand held assist Gait Pattern/deviations: Shuffle;Trunk flexed Gait velocity: Decreased Gait velocity interpretation: <1.31 ft/sec, indicative of household ambulator General Gait Details: VC's for improved posture. Required encouragement to continue ambulating as pt was stopping often and crying out, saying she can't walk. As stated above, pt was easily redirected and could actually walk quite well when distracted with conversation.    Stairs             Wheelchair Mobility    Modified Rankin (Stroke Patients Only)       Balance Overall balance assessment: Needs assistance Sitting-balance support: Bilateral upper extremity supported;Feet unsupported Sitting balance-Leahy Scale: Poor Sitting balance - Comments: MinA for initial stability; minguardA to supervisionA for ADL tasks at EOB;pt stead on commode with BUEs supported by grab bar and sink   Standing balance support: Bilateral upper extremity supported Standing balance-Leahy Scale: Poor Standing balance comment: stability of modA +2 to take steps                            Cognition Arousal/Alertness: Awake/alert Behavior During Therapy: Restless Overall Cognitive Status: No family/caregiver present to determine baseline cognitive functioning Area of Impairment: Attention;Orientation;Following commands;Awareness;Problem solving;Memory;Safety/judgement                 Orientation Level: Disoriented to;Place;Time Current Attention Level: Sustained Memory: Decreased short-term memory;Decreased recall of precautions Following Commands: Follows one step commands with increased time Safety/Judgement: Decreased awareness of  deficits;Decreased awareness of  safety Awareness: Intellectual Problem Solving: Slow processing;Requires verbal cues;Requires tactile cues;Difficulty sequencing;Decreased initiation General Comments: Pt groaning in pain, but easily redirected with other tasks. Pt sitting in chair abruptly displaying decreased safety awareness; pt's HOH status, makes tasks difficult at times. Pt with confusion early in session when asking multiple times "where am I?" later, pt stating "I am 86 years old. I just had a birthday." Pt requires motivation cues to participate.      Exercises      General Comments        Pertinent Vitals/Pain Pain Assessment: Faces Faces Pain Scale: Hurts whole lot Pain Location: back Pain Descriptors / Indicators: Grimacing;Operative site guarding;Moaning (yelling ) Pain Intervention(s): Limited activity within patient's tolerance;Monitored during session;Repositioned    Home Living Family/patient expects to be discharged to:: Assisted living             Home Equipment: Wheelchair - manual      Prior Function Level of Independence: Needs assistance  Gait / Transfers Assistance Needed: Assume pt was using WC, as WC in room, however, unsure given pt's cognitive deficits. Pt reports staff assisted with all mobility tasks.  ADL's / Homemaking Assistance Needed: Staff assist with ADLs     PT Goals (current goals can now be found in the care plan section) Acute Rehab PT Goals Patient Stated Goal: be pain free PT Goal Formulation: With patient Time For Goal Achievement: 03/05/20 Potential to Achieve Goals: Fair    Frequency    Min 5X/week      PT Plan      Co-evaluation PT/OT/SLP Co-Evaluation/Treatment: Yes Reason for Co-Treatment: Necessary to address cognition/behavior during functional activity;For patient/therapist safety;To address functional/ADL transfers (Pt with dementia and behaviorally difficult. ) PT goals addressed during session:  Mobility/safety with mobility;Balance        AM-PAC PT "6 Clicks" Mobility   Outcome Measure  Help needed turning from your back to your side while in a flat bed without using bedrails?: A Little Help needed moving from lying on your back to sitting on the side of a flat bed without using bedrails?: A Little Help needed moving to and from a bed to a chair (including a wheelchair)?: A Lot Help needed standing up from a chair using your arms (e.g., wheelchair or bedside chair)?: A Lot Help needed to walk in hospital room?: A Lot Help needed climbing 3-5 steps with a railing? : A Lot 6 Click Score: 14    End of Session Equipment Utilized During Treatment: Back brace Activity Tolerance: Patient limited by pain Patient left: with call bell/phone within reach;Other (comment) (in wheelchair per RN request) Nurse Communication: Mobility status PT Visit Diagnosis: Pain;Muscle weakness (generalized) (M62.81) Pain - part of body:  (low back)     Time: 6568-1275 PT Time Calculation (min) (ACUTE ONLY): 33 min  Charges:  $Gait Training: 23-37 mins                     Rolinda Roan, PT, DPT Acute Rehabilitation Services Pager: [phone] Office: [phone]    [name] 02/21/2020, 1:56 PM

## 2020-02-21 NOTE — TOC Initial Note (Addendum)
[facility]) - Initial/Assessment Note    Patient Details  Name: [name] MRN: [id number] Date of Birth: 1923/06/05  Transition of Care [facility]) CM/SW Contact:    Benard Halsted, LCSW Phone Number: 02/21/2020, 11:56 AM  Clinical Narrative:                 9am-Patient resides at [place]. CSW spoke with their Development worker, international aid, Threasa Beards. She requested her DC Summary and scripts for any new meds along with an order for her back brace. CSW faxed the requested documents. She expressed concern about patient's pain being under control before return. She requested CSW arrange PTAR for transportation. She stated [place] will resume outpatient therapy with patient so she can get more visits than with home health. No orders needed.   9:30am-CSW spoke with patient's daughter who requested CSW contact patient's son for discharge arrangements as she was going to an appointment. CSW spoke with patient's son and answered his questions; directed him to RN for medication questions.   11:45am-CSW notified by RN that patient's son has arranged for [facility] to pick patient up at 12:30pm.   Expected Discharge Plan: Assisted Living Barriers to Discharge: No Barriers Identified   Patient Goals and CMS Choice   CMS Medicare.gov Compare Post Acute Care list provided to:: Patient Represenative (must comment) (Son) Choice offered to / list presented to : Adult Children  Expected Discharge Plan and Services Expected Discharge Plan: Assisted Living In-house Referral: Clinical Social Work   Post Acute Care Choice: Resumption of Svcs/PTA Provider Living arrangements for the past 2 months: [place] Expected Discharge Date: 02/21/20                                    Prior Living Arrangements/Services Living arrangements for the past 2 months: [place] Lives with:: Facility Resident Patient language and need for interpreter reviewed:: Yes Do  you feel safe going back to the place where you live?: Yes      Need for Family Participation in Patient Care: Yes (Comment) Care giver support system in place?: Yes (comment) Current home services: Other (comment) (therapy through [employer]) Criminal Activity/Legal Involvement Pertinent to Current Situation/Hospitalization: No - Comment as needed  Activities of Daily Living Home Assistive Devices/Equipment: Eyeglasses, Cane (specify quad or straight), Hearing aid, Walker (specify type), Wheelchair ADL Screening (condition at time of admission) Patient's cognitive ability adequate to safely complete daily activities?: Yes Is the patient deaf or have difficulty hearing?: Yes Does the patient have difficulty seeing, even when wearing glasses/contacts?: No Does the patient have difficulty concentrating, remembering, or making decisions?: No Patient able to express need for assistance with ADLs?: Yes Does the patient have difficulty dressing or bathing?: No Independently performs ADLs?: Yes (appropriate for developmental age) Does the patient have difficulty walking or climbing stairs?: Yes Weakness of Legs: Both Weakness of Arms/Hands: None  Permission Sought/Granted Permission sought to share information with : Facility Sport and exercise psychologist, Family Supports Permission granted to share information with : No  Share Information with NAME: Dorothyann Peng  Permission granted to share info w AGENCY: [facility]  Permission granted to share info w Relationship: Son  Permission granted to share info w Contact Information: [phone]  Emotional Assessment Appearance:: Appears stated age Attitude/Demeanor/Rapport: Unable to Assess Affect (typically observed): Unable to Assess Orientation: : Oriented to Self Alcohol / Substance Use: Not Applicable Psych  Involvement: No (comment)  Admission diagnosis:  T12 compression fracture ([place]) [S22.080A] Patient Active Problem List   Diagnosis Date  Noted  . T12 compression fracture ([place]) 01/01/2020  . Intractable pain 12/31/2019  . Low back pain 12/31/2019  . Palliative care by specialist   . Goals of care, counseling/discussion   . DNR (do not resuscitate)   . Compression fracture of T12 vertebra ([place]) 12/04/2019  . Spinal fracture of T12 vertebra ([place]) 12/04/2019  . Fever, unspecified 02/15/2016  . Anxiety 02/15/2016  . Anemia 02/15/2016  . Hyponatremia 02/15/2016  . Bradycardia   . Constipation   . Depression   . MDD (major depressive disorder), recurrent severe, without psychosis ([place])   . Symptomatic bradycardia 01/30/2016  . Symptomatic anemia 01/29/2016  . Hypertension   . Osteoporosis   . Hyperlipidemia   . GERD (gastroesophageal reflux disease)   . Spinal stenosis   . Hearing loss 12/08/2011  . Osteoarthritis of hip 12/02/2011  . Endometriosis   . DIVERTICULOSIS-COLON 02/12/2009  . ALLERGIC RHINITIS 01/19/2007  . ESOPHAGEAL STRICTURE 01/19/2007   PCP:  Harrison Mons, [place] Pharmacy:   [id number] - [place], [place] - [address] [place] [address] Phone: [phone] Fax: [phone]     Social Determinants of Health (SDOH) Interventions    Readmission Risk Interventions Readmission Risk Prevention Plan 01/03/2020  Post Dischage Appt Complete  Medication Screening Complete  Transportation Screening Complete  Some recent data might be hidden

## 2020-02-22 ENCOUNTER — Telehealth: Payer: Self-pay | Admitting: Internal Medicine

## 2020-02-22 NOTE — Telephone Encounter (Signed)
[name] ([name] daughter) stated that Gerald Stabs Rama spoke to Dr. Jerilee Hoh about accepting her mother has a new pt and that she would be worked in sooner than PCP availability. Sending message back for approval to place new pt sooner than availability     [name] can be reached at [phone]

## 2020-02-22 NOTE — Telephone Encounter (Signed)
This is the one I mentioned. OK to place next available. She needs 30 mins for sure

## 2020-02-22 NOTE — Telephone Encounter (Signed)
LVM to schedule next available slot

## 2020-02-25 ENCOUNTER — Emergency Department (HOSPITAL_COMMUNITY): Payer: Medicare HMO

## 2020-02-25 ENCOUNTER — Emergency Department (HOSPITAL_BASED_OUTPATIENT_CLINIC_OR_DEPARTMENT_OTHER): Payer: Medicare HMO

## 2020-02-25 ENCOUNTER — Encounter (HOSPITAL_BASED_OUTPATIENT_CLINIC_OR_DEPARTMENT_OTHER): Payer: Self-pay | Admitting: Emergency Medicine

## 2020-02-25 ENCOUNTER — Other Ambulatory Visit: Payer: Self-pay

## 2020-02-25 ENCOUNTER — Inpatient Hospital Stay (HOSPITAL_BASED_OUTPATIENT_CLINIC_OR_DEPARTMENT_OTHER)
Admission: EM | Admit: 2020-02-25 | Discharge: 2020-03-02 | DRG: 552 | Disposition: A | Discharge status: Hospice | Payer: Medicare HMO | Source: Skilled Nursing Facility | Attending: Internal Medicine | Admitting: Internal Medicine

## 2020-02-25 DIAGNOSIS — Z87891 Personal history of nicotine dependence: Secondary | ICD-10-CM | POA: Diagnosis not present

## 2020-02-25 DIAGNOSIS — M4804 Spinal stenosis, thoracic region: Principal | ICD-10-CM | POA: Diagnosis present

## 2020-02-25 DIAGNOSIS — R5381 Other malaise: Secondary | ICD-10-CM

## 2020-02-25 DIAGNOSIS — R410 Disorientation, unspecified: Secondary | ICD-10-CM | POA: Diagnosis present

## 2020-02-25 DIAGNOSIS — Z823 Family history of stroke: Secondary | ICD-10-CM

## 2020-02-25 DIAGNOSIS — F419 Anxiety disorder, unspecified: Secondary | ICD-10-CM | POA: Diagnosis not present

## 2020-02-25 DIAGNOSIS — G8929 Other chronic pain: Secondary | ICD-10-CM | POA: Diagnosis not present

## 2020-02-25 DIAGNOSIS — F329 Major depressive disorder, single episode, unspecified: Secondary | ICD-10-CM | POA: Diagnosis not present

## 2020-02-25 DIAGNOSIS — M546 Pain in thoracic spine: Secondary | ICD-10-CM | POA: Diagnosis not present

## 2020-02-25 DIAGNOSIS — M47814 Spondylosis without myelopathy or radiculopathy, thoracic region: Secondary | ICD-10-CM | POA: Diagnosis present

## 2020-02-25 DIAGNOSIS — Z20822 Contact with and (suspected) exposure to covid-19: Secondary | ICD-10-CM | POA: Diagnosis not present

## 2020-02-25 DIAGNOSIS — D649 Anemia, unspecified: Secondary | ICD-10-CM | POA: Diagnosis present

## 2020-02-25 DIAGNOSIS — W19XXXD Unspecified fall, subsequent encounter: Secondary | ICD-10-CM | POA: Diagnosis not present

## 2020-02-25 DIAGNOSIS — M4854XD Collapsed vertebra, not elsewhere classified, thoracic region, subsequent encounter for fracture with routine healing: Secondary | ICD-10-CM | POA: Diagnosis present

## 2020-02-25 DIAGNOSIS — K219 Gastro-esophageal reflux disease without esophagitis: Secondary | ICD-10-CM | POA: Diagnosis present

## 2020-02-25 DIAGNOSIS — Z7401 Bed confinement status: Secondary | ICD-10-CM | POA: Diagnosis not present

## 2020-02-25 DIAGNOSIS — S22080A Wedge compression fracture of T11-T12 vertebra, initial encounter for closed fracture: Secondary | ICD-10-CM | POA: Diagnosis present

## 2020-02-25 DIAGNOSIS — I10 Essential (primary) hypertension: Secondary | ICD-10-CM | POA: Diagnosis present

## 2020-02-25 DIAGNOSIS — K5900 Constipation, unspecified: Secondary | ICD-10-CM | POA: Diagnosis not present

## 2020-02-25 DIAGNOSIS — E785 Hyperlipidemia, unspecified: Secondary | ICD-10-CM | POA: Diagnosis present

## 2020-02-25 DIAGNOSIS — Z96641 Presence of right artificial hip joint: Secondary | ICD-10-CM | POA: Diagnosis present

## 2020-02-25 DIAGNOSIS — Z66 Do not resuscitate: Secondary | ICD-10-CM | POA: Diagnosis present

## 2020-02-25 DIAGNOSIS — Z515 Encounter for palliative care: Secondary | ICD-10-CM | POA: Diagnosis not present

## 2020-02-25 DIAGNOSIS — S22080G Wedge compression fracture of T11-T12 vertebra, subsequent encounter for fracture with delayed healing: Secondary | ICD-10-CM | POA: Diagnosis not present

## 2020-02-25 DIAGNOSIS — Z79899 Other long term (current) drug therapy: Secondary | ICD-10-CM | POA: Diagnosis not present

## 2020-02-25 DIAGNOSIS — Z833 Family history of diabetes mellitus: Secondary | ICD-10-CM | POA: Diagnosis not present

## 2020-02-25 DIAGNOSIS — R52 Pain, unspecified: Secondary | ICD-10-CM

## 2020-02-25 DIAGNOSIS — M549 Dorsalgia, unspecified: Secondary | ICD-10-CM | POA: Diagnosis present

## 2020-02-25 DIAGNOSIS — Z7189 Other specified counseling: Secondary | ICD-10-CM

## 2020-02-25 LAB — CBC WITH DIFFERENTIAL/PLATELET
Abs Immature Granulocytes: 0.06 10*3/uL (ref 0.00–0.07)
Basophils Absolute: 0.1 10*3/uL (ref 0.0–0.1)
Basophils Relative: 1 %
Eosinophils Absolute: 0.5 10*3/uL (ref 0.0–0.5)
Eosinophils Relative: 5 %
HCT: 31.5 % — ABNORMAL LOW (ref 36.0–46.0)
Hemoglobin: 10.1 g/dL — ABNORMAL LOW (ref 12.0–15.0)
Immature Granulocytes: 1 %
Lymphocytes Relative: 12 %
Lymphs Abs: 1.2 10*3/uL (ref 0.7–4.0)
MCH: 30.7 pg (ref 26.0–34.0)
MCHC: 32.1 g/dL (ref 30.0–36.0)
MCV: 95.7 fL (ref 80.0–100.0)
Monocytes Absolute: 1 10*3/uL (ref 0.1–1.0)
Monocytes Relative: 9 %
Neutro Abs: 7.7 10*3/uL (ref 1.7–7.7)
Neutrophils Relative %: 72 %
Platelets: 288 10*3/uL (ref 150–400)
RBC: 3.29 MIL/uL — ABNORMAL LOW (ref 3.87–5.11)
RDW: 13.6 % (ref 11.5–15.5)
WBC: 10.5 10*3/uL (ref 4.0–10.5)
nRBC: 0 % (ref 0.0–0.2)

## 2020-02-25 LAB — MAGNESIUM: Magnesium: 1.7 mg/dL (ref 1.7–2.4)

## 2020-02-25 LAB — RETICULOCYTES
Immature Retic Fract: 16.6 % — ABNORMAL HIGH (ref 2.3–15.9)
RBC.: 3.34 MIL/uL — ABNORMAL LOW (ref 3.87–5.11)
Retic Count, Absolute: 61.1 10*3/uL (ref 19.0–186.0)
Retic Ct Pct: 1.8 % (ref 0.4–3.1)

## 2020-02-25 LAB — VITAMIN B12: Vitamin B-12: 370 pg/mL (ref 180–914)

## 2020-02-25 LAB — BASIC METABOLIC PANEL
Anion gap: 10 (ref 5–15)
BUN: 24 mg/dL — ABNORMAL HIGH (ref 8–23)
CO2: 29 mmol/L (ref 22–32)
Calcium: 9.1 mg/dL (ref 8.9–10.3)
Chloride: 97 mmol/L — ABNORMAL LOW (ref 98–111)
Creatinine, Ser: 0.78 mg/dL (ref 0.44–1.00)
GFR calc Af Amer: >60 mL/min (ref >60)
GFR calc non Af Amer: >60 mL/min (ref >60)
Glucose, Bld: 110 mg/dL — ABNORMAL HIGH (ref 70–99)
Potassium: 3.9 mmol/L (ref 3.5–5.1)
Sodium: 136 mmol/L (ref 135–145)

## 2020-02-25 LAB — FERRITIN: Ferritin: 149 ng/mL (ref 11–307)

## 2020-02-25 LAB — IRON AND TIBC
Iron: 41 ug/dL (ref 28–170)
Saturation Ratios: 17 % (ref 10.4–31.8)
TIBC: 248 ug/dL — ABNORMAL LOW (ref 250–450)
UIBC: 207 ug/dL

## 2020-02-25 LAB — SARS CORONAVIRUS 2 BY RT PCR (HOSPITAL ORDER, PERFORMED IN ~~LOC~~ HOSPITAL LAB): SARS Coronavirus 2: NEGATIVE

## 2020-02-25 LAB — FOLATE: Folate: 20 ng/mL (ref >5.9)

## 2020-02-25 MED ORDER — ONDANSETRON HCL 4 MG/2ML IJ SOLN
4.0000 mg | Freq: Once | INTRAMUSCULAR | Status: AC
  Administered 2020-02-25: 4 mg via INTRAVENOUS
  Filled 2020-02-25: qty 2

## 2020-02-25 MED ORDER — ACETAMINOPHEN 650 MG RE SUPP: 650.0000 mg | Freq: Four times a day (QID) | RECTAL | Status: DC | PRN

## 2020-02-25 MED ORDER — ACETAMINOPHEN 325 MG PO TABS
650.0000 mg | ORAL_TABLET | Freq: Four times a day (QID) | ORAL | Status: DC | PRN
  Administered 2020-03-01 – 2020-03-02 (×2): 650 mg via ORAL
  Filled 2020-02-25 (×2): qty 2

## 2020-02-25 MED ORDER — SODIUM CHLORIDE 0.9% FLUSH
3.0000 mL | Freq: Two times a day (BID) | INTRAVENOUS | Status: DC
  Administered 2020-02-26 – 2020-03-01 (×10): 3 mL via INTRAVENOUS

## 2020-02-25 MED ORDER — OXYCODONE HCL 5 MG PO TABS
5.0000 mg | ORAL_TABLET | Freq: Four times a day (QID) | ORAL | Status: DC | PRN
  Administered 2020-02-26: 5 mg via ORAL
  Filled 2020-02-25: qty 1

## 2020-02-25 MED ORDER — SODIUM CHLORIDE 0.9 % IV SOLN: INTRAVENOUS | Status: AC

## 2020-02-25 MED ORDER — BISACODYL 10 MG RE SUPP: 10.0000 mg | Freq: Every day | RECTAL | Status: DC | PRN

## 2020-02-25 MED ORDER — SIMVASTATIN 20 MG PO TABS
20.0000 mg | ORAL_TABLET | Freq: Every day | ORAL | Status: DC
  Administered 2020-02-26 – 2020-03-01 (×5): 20 mg via ORAL
  Filled 2020-02-25 (×6): qty 1

## 2020-02-25 MED ORDER — MORPHINE SULFATE (PF) 2 MG/ML IV SOLN
2.0000 mg | Freq: Once | INTRAVENOUS | Status: AC
  Administered 2020-02-25: 2 mg via INTRAVENOUS
  Filled 2020-02-25 (×3): qty 1

## 2020-02-25 MED ORDER — DIAZEPAM 5 MG/ML IJ SOLN
2.5000 mg | Freq: Once | INTRAMUSCULAR | Status: AC
  Administered 2020-02-25: 2.5 mg via INTRAVENOUS
  Filled 2020-02-25: qty 2

## 2020-02-25 MED ORDER — OXYCODONE HCL ER 10 MG PO T12A
10.0000 mg | EXTENDED_RELEASE_TABLET | Freq: Two times a day (BID) | ORAL | Status: DC
  Administered 2020-02-26 – 2020-02-27 (×4): 10 mg via ORAL
  Filled 2020-02-25 (×4): qty 1

## 2020-02-25 MED ORDER — ONDANSETRON HCL 4 MG/2ML IJ SOLN: 4.0000 mg | Freq: Four times a day (QID) | INTRAMUSCULAR | Status: DC | PRN

## 2020-02-25 MED ORDER — HYDRALAZINE HCL 25 MG PO TABS
25.0000 mg | ORAL_TABLET | Freq: Three times a day (TID) | ORAL | Status: DC
  Administered 2020-02-26 – 2020-03-02 (×16): 25 mg via ORAL
  Filled 2020-02-25 (×16): qty 1

## 2020-02-25 MED ORDER — CALCITONIN (SALMON) 200 UNIT/ACT NA SOLN
1.0000 | Freq: Every day | NASAL | Status: DC
  Administered 2020-02-26 – 2020-03-01 (×5): 1 via NASAL
  Filled 2020-02-25: qty 3.7

## 2020-02-25 MED ORDER — ONDANSETRON HCL 4 MG PO TABS: 4.0000 mg | ORAL_TABLET | Freq: Four times a day (QID) | ORAL | Status: DC | PRN

## 2020-02-25 MED ORDER — MORPHINE SULFATE (PF) 4 MG/ML IV SOLN
4.0000 mg | Freq: Once | INTRAVENOUS | Status: AC
  Administered 2020-02-25: 4 mg via INTRAVENOUS
  Filled 2020-02-25: qty 1

## 2020-02-25 MED ORDER — METHOCARBAMOL 1000 MG/10ML IJ SOLN
500.0000 mg | Freq: Four times a day (QID) | INTRAVENOUS | Status: DC | PRN
  Administered 2020-02-26: 500 mg via INTRAVENOUS
  Filled 2020-02-25: qty 5
  Filled 2020-02-25: qty 500

## 2020-02-25 MED ORDER — SENNA 8.6 MG PO TABS
1.0000 | ORAL_TABLET | Freq: Two times a day (BID) | ORAL | Status: DC
  Administered 2020-02-26 – 2020-03-01 (×9): 8.6 mg via ORAL
  Filled 2020-02-25 (×10): qty 1

## 2020-02-25 MED ORDER — GABAPENTIN 300 MG PO CAPS
300.0000 mg | ORAL_CAPSULE | Freq: Two times a day (BID) | ORAL | Status: DC
  Administered 2020-02-26 – 2020-02-27 (×4): 300 mg via ORAL
  Filled 2020-02-25 (×4): qty 1

## 2020-02-25 MED ORDER — DOCUSATE SODIUM 100 MG PO CAPS
100.0000 mg | ORAL_CAPSULE | Freq: Two times a day (BID) | ORAL | Status: DC
  Administered 2020-02-26 – 2020-03-01 (×9): 100 mg via ORAL
  Filled 2020-02-25 (×10): qty 1

## 2020-02-25 MED ORDER — PREDNISONE 5 MG PO TABS
5.0000 mg | ORAL_TABLET | Freq: Every day | ORAL | Status: DC
  Administered 2020-02-26: 5 mg via ORAL
  Filled 2020-02-25: qty 1

## 2020-02-25 MED ORDER — POLYETHYLENE GLYCOL 3350 17 G PO PACK: 17.0000 g | PACK | Freq: Every day | ORAL | Status: DC | PRN

## 2020-02-25 NOTE — ED Triage Notes (Signed)
Per EMS, pt had back surgery on Monday, here for intractable back pain, pt yelling on arrival with movement and not

## 2020-02-25 NOTE — ED Notes (Signed)
Patient transported to MRI 

## 2020-02-25 NOTE — ED Provider Notes (Signed)
  Provider Note MRN:  [id number]  Arrival date & time: 02/25/20    ED Course and Medical Decision Making  Assumed care from Dr. Laverta Baltimore upon transfer to [facility].  Severe back pain in the setting of compression fracture, awaiting MRI imaging.  On my exam patient seems to have intact strength and sensation to the lower extremities.  MRI revealing severe compression fracture with some cord involvement but all is unchanged, patient continues to have severe pain, will admit to hospitalist service for pain management.  Procedures  Final Clinical Impressions(s) / ED Diagnoses  No diagnosis found.  ED Discharge Orders    None      Discharge Instructions   None     Barth Kirks. [place] Small, [place] [email or web address]    Maudie Flakes, MD 02/25/20 2052

## 2020-02-25 NOTE — ED Provider Notes (Signed)
Emergency Department Provider Note   I have reviewed the triage vital signs and the nursing notes.   HISTORY  Chief Complaint Back Pain   HPI [name] is a 86 y.o. female with past medical history reviewed below including recent kyphoplasty with Dr. [name] on 8/2 for T12 compression fx with retropulsion presents emergency department with severe breakthrough pain in her lower back.  Patient was discharged to [facility] for rehabilitation after surgery.  She has had intermittent and progressively worsening pain in her back.  She describes pain radiating down into the legs intermittently with some numbness in the right foot.  She has not had any falls since arrival to the nursing facility.  She denies any groin numbness.  Pain is severe and worse with movement.  No bowel or bladder incontinence or urine retention symptoms. She has been taking her Oxycodone including a last dose at [facility] today.    Past Medical History:  Diagnosis Date  . Allergy   . Arthritis   . Depression   . Endometriosis   . GERD (gastroesophageal reflux disease)   . Hearing loss 12/08/2011   bil, hearing aids  . Heart murmur   . Hyperlipidemia   . Hypertension   . Osteoporosis   . Ovarian cyst   . Polymyalgia rheumatica ([place])   . Post herpetic neuralgia   . S/P right THA, AA 12/16/2011  . Shingles 2020  . Skin cancer   . Spinal stenosis     Patient Active Problem List   Diagnosis Date Noted  . Back pain 02/25/2020  . T12 compression fracture ([place]) 01/01/2020  . Intractable pain 12/31/2019  . Low back pain 12/31/2019  . Palliative care by specialist   . Goals of care, counseling/discussion   . DNR (do not resuscitate)   . Compression fracture of T12 vertebra ([place]) 12/04/2019  . Spinal fracture of T12 vertebra ([place]) 12/04/2019  . Fever, unspecified 02/15/2016  . Anxiety 02/15/2016  . Anemia 02/15/2016  . Hyponatremia 02/15/2016  . Bradycardia   . Constipation   . Depression   . MDD (major  depressive disorder), recurrent severe, without psychosis ([place])   . Symptomatic bradycardia 01/30/2016  . Symptomatic anemia 01/29/2016  . Hypertension   . Osteoporosis   . Hyperlipidemia   . GERD (gastroesophageal reflux disease)   . Spinal stenosis   . Hearing loss 12/08/2011  . Osteoarthritis of hip 12/02/2011  . Endometriosis   . DIVERTICULOSIS-COLON 02/12/2009  . ALLERGIC RHINITIS 01/19/2007  . ESOPHAGEAL STRICTURE 01/19/2007    Past Surgical History:  Procedure Laterality Date  . CATARACT EXTRACTION, BILATERAL  12-08-11   bil  . DILATION AND CURETTAGE OF UTERUS    . HERNIA REPAIR    . HYSTEROSCOPY    . KYPHOPLASTY N/A 02/20/2020   Procedure: THORACIC TWELVE KYPHOPLASTY;  Surgeon: Earnie Larsson, MD;  Location: [facility];  Service: Neurosurgery;  Laterality: N/A;  3C  . OOPHORECTOMY     bilateral  . STERIOD INJECTION  12/09/11   to spine for rt calf pain and spasms  . TIBIA FRACTURE SURGERY     trauma  . TONSILLECTOMY AND ADENOIDECTOMY    . TOTAL HIP ARTHROPLASTY  12/16/2011   Procedure: TOTAL HIP ARTHROPLASTY ANTERIOR APPROACH;  Surgeon: Mauri Pole, MD;  Location: [facility];  Service: Orthopedics;  Laterality: Right;    Allergies Cartia xt [diltiazem], Codeine, Lisinopril-hydrochlorothiazide, Mercury, Sulfa antibiotics, and Sulfonamide derivatives  Family History  Problem Relation Age of Onset  .  Stroke Sister   . Diabetes Daughter   . Cancer Father        esophogeal  . Cancer Paternal Aunt        stomach  . Breast cancer Maternal Grandmother        Age 79  . Stroke Mother     Social History Social History   Tobacco Use  . Smoking status: Former Research scientist (life sciences)  . Smokeless tobacco: Never Used  Vaping Use  . Vaping Use: Never used  Substance Use Topics  . Alcohol use: No    Alcohol/week: 1.0 standard drink    Types: 1 Standard drinks or equivalent per week  . Drug use: No    Review of Systems  Constitutional: No fever/chills Eyes: No visual changes. ENT: No  sore throat. Cardiovascular: Denies chest pain. Respiratory: Denies shortness of breath. Gastrointestinal: No abdominal pain.  No nausea, no vomiting.  No diarrhea.  No constipation. Genitourinary: Negative for dysuria. Musculoskeletal: Positive for back pain with right leg pain.  Skin: Negative for rash. Neurological: Negative for headaches, focal weakness. Intermittent numbness in the right leg.   10-point ROS otherwise negative.  ____________________________________________   PHYSICAL EXAM:  VITAL SIGNS: ED Triage Vitals  Enc Vitals Group     BP 02/25/20 0922 (!) 162/83     Pulse Rate 02/25/20 0922 77     Resp --      Temp 02/25/20 0922 98.5 F (36.9 C)     Temp Source 02/25/20 0922 Oral     SpO2 02/25/20 0922 93 %   Constitutional: Alert and oriented. Well appearing and in no acute distress. Intermittently screaming in pain.  Eyes: Conjunctivae are normal.  Head: Atraumatic. Nose: No congestion/rhinnorhea. Mouth/Throat: Mucous membranes are moist.   Neck: No stridor.   Cardiovascular: Normal rate, regular rhythm. Good peripheral circulation. Grossly normal heart sounds.   Respiratory: Normal respiratory effort.  No retractions. Lungs CTAB. Gastrointestinal: Soft and nontender. No distention.  Musculoskeletal: RLE held in position of comfort with external rotation noted. No ankle or knee tenderness.  Neurologic:  Normal speech and language. No gross focal neurologic deficits are appreciated. Normal strength and sensation in the bilateral LEs.  Skin:  Skin is warm, dry and intact. No rash noted.  ____________________________________________   LABS (all labs ordered are listed, but only abnormal results are displayed)  Labs Reviewed  BASIC METABOLIC PANEL - Abnormal; Notable for the following components:      Result Value   Chloride 97 (*)    Glucose, Bld 110 (*)    BUN 24 (*)    All other components within normal limits  CBC WITH DIFFERENTIAL/PLATELET - Abnormal;  Notable for the following components:   RBC 3.29 (*)    Hemoglobin 10.1 (*)    HCT 31.5 (*)    All other components within normal limits  IRON AND TIBC - Abnormal; Notable for the following components:   TIBC 248 (*)    All other components within normal limits  RETICULOCYTES - Abnormal; Notable for the following components:   RBC. 3.34 (*)    Immature Retic Fract 16.6 (*)    All other components within normal limits  CBC WITH DIFFERENTIAL/PLATELET - Abnormal; Notable for the following components:   WBC 11.1 (*)    RBC 3.49 (*)    Hemoglobin 10.2 (*)    HCT 33.4 (*)    Neutro Abs 8.2 (*)    Eosinophils Absolute 0.6 (*)    All other components within normal  limits  COMPREHENSIVE METABOLIC PANEL - Abnormal; Notable for the following components:   Chloride 95 (*)    Glucose, Bld 117 (*)    Total Protein 6.3 (*)    Albumin 3.1 (*)    All other components within normal limits  SARS CORONAVIRUS 2 BY RT PCR (HOSPITAL ORDER, [facility])  VITAMIN B12  FOLATE  FERRITIN  MAGNESIUM  MAGNESIUM  PHOSPHORUS  TSH  URINALYSIS, ROUTINE W REFLEX MICROSCOPIC   ____________________________________________  RADIOLOGY  CT Thoracic Spine Wo Contrast  Result Date: 02/25/2020 CLINICAL DATA:  Spine fracture, thoracic, traumatic. Additional provided: Surgery on Monday for back, pain despite meds. EXAM: CT THORACIC SPINE WITHOUT CONTRAST TECHNIQUE: Multidetector CT images of the thoracic were obtained using the standard protocol without intravenous contrast. COMPARISON:  Thoracic spine MRI 12/31/2019, intra procedural fluoroscopic images of the lower thoracic spine from vertebral augmentation performed 02/20/2020. FINDINGS: Alignment: 7 mm bony retropulsion at the T12 level, which may be slightly progressed as compared to the MRI of 12/31/2019. No significant spondylolisthesis. Vertebrae: Redemonstrated severe T12 compression fracture with 7 mm bony retropulsion. There has been  interval vertebral augmentation at this level. Vertebral body height is otherwise maintained. No interval sure is identified. Chronic deformity of the T11 spinous process and lamina which is at least partially congenital. Paraspinal and other soft tissues: Redemonstrated severe T12 Disc levels: Moderate disc space narrowing throughout the thoracic spine. At T6-T7, there is a somewhat prominent calcified central disc herniate which contributes to mild spinal canal stenosis and resultant mild spinal cord mass effect on the prior MRI of 12/31/2019. At the T12 level, bony retropulsion contributes to severe spinal canal stenosis with likely persistent mass effect upon the conus medullaris. No appreciable significant spinal canal stenosis at the remaining levels. IMPRESSION: Redemonstrated severe T12 vertebral compression fracture. There has been interval vertebral augmentation since the thoracic spine MRI of 12/31/2019. There is 7 mm bony retropulsion at this level which may be slightly progressed. Severe spinal canal stenosis at this level with likely persistent impingement of the conus medullaris. Thoracic spine MRI may be obtained to assess for spinal cord signal abnormality, as clinically warranted. No evidence of interval thoracic spine fracture. Thoracic spondylosis as described. Most notably at T6-T7, a somewhat prominent central calcified disc herniation contributes to mild spinal canal stenosis and there was mild spinal spinal cord mass effect at this site on the recent prior MRI. Electronically Signed   By: Kellie Simmering DO   On: 02/25/2020 10:58   CT Lumbar Spine Wo Contrast  Result Date: 02/25/2020 CLINICAL DATA:  Lumbar radiculopathy, trauma. EXAM: CT LUMBAR SPINE WITHOUT CONTRAST TECHNIQUE: Multidetector CT imaging of the lumbar spine was performed without intravenous contrast administration. Multiplanar CT image reconstructions were also generated. COMPARISON:  Lumbar spine MRI 12/31/2019, CT of the  lumbar spine 12/31/2019 FINDINGS: Segmentation: Lumbar dextrocurvature.  5 lumbar vertebrae. Alignment: Trace L1-L2, L4-L5 and L5-S1 grade 1 anterolisthesis. Vertebrae: Please refer to concurrently performed thoracic spine CT for description of findings at the T12 level. Lumbar vertebral body height is maintained. Redemonstrated subacute bilateral L5 transverse process fractures (series 3, image 88). No evidence of interval fracture to the lumbar spine. Paraspinal and other soft tissues: Aortoiliac atherosclerosis. Atrophy of the lumbar paraspinal musculature. Disc levels: Multilevel disc space narrowing and disc vacuum phenomenon. Disc space narrowing is greatest at L1-L2, L2-L3 and L4-L5 (moderate in severity at these levels). Multilevel disc bulges and endplate spurring as well as multilevel facet arthrosis  and ligamentum flavum hypertrophy. No more than mild appreciable spinal canal stenosis at any level. Unchanged multilevel neural foraminal narrowing. Multilevel neural foraminal narrowing greatest bilaterally at L4-L5 (moderately severe at these sites). IMPRESSION: Please refer to the concurrently performed thoracic spine CT for a description of findings at the T12 level. Redemonstrated subacute bilateral L5 transverse process fractures. No evidence of interval fracture to the lumbar spine. Lumbar spondylosis as described. No more than mild appreciable spinal canal stenosis at any level. Multilevel neural foraminal narrowing. Most notably there is moderate/severe bilateral neural foraminal narrowing at L4-L5. Lumbar dextrocurvature. Trace multilevel spondylolisthesis as described. Aortic Atherosclerosis (ICD10-I70.0). Electronically Signed   By: Kellie Simmering DO   On: 02/25/2020 11:13   MR THORACIC SPINE WO CONTRAST  Result Date: 02/25/2020 CLINICAL DATA:  Posttraumatic spinal fracture. T12 compression fracture identified on Nov 24, 2019. Multiple follow-up studies. Subsequent kyphoplasty T12 on 02/20/2020  EXAM: MRI THORACIC SPINE WITHOUT CONTRAST TECHNIQUE: Multiplanar, multisequence MR imaging of the thoracic spine was performed. No intravenous contrast was administered. COMPARISON:  CT thoracic spine 02/25/2020. MRI thoracic spine 12/31/2019 FINDINGS: Alignment:  2 mm anterolisthesis C7-T1.  Remaining alignment normal. Vertebrae: Severe compression fracture of T12 with vertebral augmentation. Cement in satisfactory position. Left-sided approach. There is 7 mm of retropulsion of T12 vertebral body into the canal causing severe spinal stenosis. This is similar to the prior MRI of 12/31/2019 No other thoracic fracture identified. Negative for metastatic disease. Cord: Severe spinal stenosis and cord compression at T12 unchanged from prior MRI of 12/31/2019. No cord signal abnormality. Large calcified disc protrusion on the right T6-7 with significant flattening of the cord ventrally and on the right side unchanged from the prior MRI. No cord signal abnormality. Paraspinal and other soft tissues: Negative for paraspinous mass edema or fluid collection. Disc levels: C7-T1: 2 mm anterolisthesis with facet degeneration. This is causing moderate foraminal stenosis bilaterally. T6-7: Large calcified disc protrusion to the right of midline with extensive cord flattening. There is sufficient CSF posterior to the cord at this level. No other levels of disc protrusion or stenosis in the thoracic spine. IMPRESSION: Severe compression fracture T12 vertebral augmentation unchanged from prior studies. 7 mm retropulsion of T12 into the spinal canal causing severe spinal stenosis and cord compression, unchanged from the prior MRI. No cord signal abnormality. Large calcified disc protrusion on the right at T6-7 with cord flattening, unchanged from prior studies. Electronically Signed   By: Franchot Gallo M.D.   On: 02/25/2020 16:11   DG Hip Unilat W or Wo Pelvis 2-3 Views Right  Result Date: 02/25/2020 CLINICAL DATA:  Right hip  pain. Recent T12 back surgery. No reported injury. EXAM: DG HIP (WITH OR WITHOUT PELVIS) 2-3V RIGHT COMPARISON:  09/28/2017 hip radiographs FINDINGS: Right total hip arthroplasty with no evidence of hardware fracture or loosening. No right hip dislocation. No acute osseous fracture. No pelvic diastasis. Mild left hip osteoarthritis. Degenerative changes in the visualized lower lumbar spine. No suspicious focal osseous lesions. IMPRESSION: Right total hip arthroplasty, with no evidence of hardware complication. No acute osseous abnormality. No right hip dislocation. Electronically Signed   By: Ilona Sorrel M.D.   On: 02/25/2020 11:20    ____________________________________________   PROCEDURES  Procedure(s) performed:   Procedures  None  ____________________________________________   INITIAL IMPRESSION / ASSESSMENT AND PLAN / ED COURSE  Pertinent labs & imaging results that were available during my care of the patient were reviewed by me and considered in my medical decision  making (see chart for details).   Patient presents to the emergency department for evaluation of breakthrough back pain in the setting of recent kyphoplasty with Dr. [name].  Patient is intermittently screaming in pain.  Exam is not consistent with acute cord compression. Patient does note some radicular symptoms but these appear to have been present prior to surgery. Will repeat CT imaging of the thoracic and lumbar spine along with hip plain films. Will treat pain and reassess.   11:15 AM  CT imaging of the thoracic spine reviewed.  Will follow with a thoracic spine MRI without contrast as patient's pain continues to be severe. Will re-dose pain medication and follow MRI results.   Spoke with MRI mobile unit here today.  They are unable to scan this patient here because she is in a stretcher and unable to get up into the mobile unit.  I advised that if MRI is required she will need to be transferred.  Spoke with Dr.  Melina Copa regarding the case.  Plan for MRI at [facility] with ED to ED transfer.  Updated the son, Cherlynn Kaiser, by phone who is in agreement with plan.  ____________________________________________  FINAL CLINICAL IMPRESSION(S) / ED DIAGNOSES  Final diagnoses:  Compression fracture of T12 vertebra, initial encounter ([place])     MEDICATIONS GIVEN DURING THIS VISIT:  Medications  sodium chloride flush (NS) 0.9 % injection 3 mL (3 mLs Intravenous Given 02/26/20 0030)  acetaminophen (TYLENOL) tablet 650 mg (has no administration in time range)    Or  acetaminophen (TYLENOL) suppository 650 mg (has no administration in time range)  ondansetron (ZOFRAN) tablet 4 mg (has no administration in time range)    Or  ondansetron (ZOFRAN) injection 4 mg (has no administration in time range)  0.9 %  sodium chloride infusion ( Intravenous New Bag/Given 02/26/20 0028)  methocarbamol (ROBAXIN) 500 mg in dextrose 5 % 50 mL IVPB (500 mg Intravenous New Bag/Given 02/26/20 0435)  docusate sodium (COLACE) capsule 100 mg (100 mg Oral Not Given 02/26/20 0029)  senna (SENOKOT) tablet 8.6 mg (8.6 mg Oral Not Given 02/26/20 0030)  polyethylene glycol (MIRALAX / GLYCOLAX) packet 17 g (has no administration in time range)  bisacodyl (DULCOLAX) suppository 10 mg (has no administration in time range)  calcitonin (salmon) (MIACALCIN/FORTICAL) nasal spray 1 spray (has no administration in time range)  gabapentin (NEURONTIN) capsule 300 mg (300 mg Oral Given 02/26/20 0029)  hydrALAZINE (APRESOLINE) tablet 25 mg (has no administration in time range)  oxyCODONE (OXYCONTIN) 12 hr tablet 10 mg (10 mg Oral Given 02/26/20 0029)  oxyCODONE (Oxy IR/ROXICODONE) immediate release tablet 5 mg (has no administration in time range)  predniSONE (DELTASONE) tablet 5 mg (has no administration in time range)  simvastatin (ZOCOR) tablet 20 mg (has no administration in time range)  morphine 4 MG/ML injection 4 mg (4 mg Intravenous Given 02/25/20 0940)    ondansetron (ZOFRAN) injection 4 mg (4 mg Intravenous Given 02/25/20 0939)  diazepam (VALIUM) injection 2.5 mg (2.5 mg Intravenous Given 02/25/20 1011)  morphine 4 MG/ML injection 4 mg (4 mg Intravenous Given 02/25/20 1118)  morphine 4 MG/ML injection 4 mg (4 mg Intravenous Given 02/25/20 1702)  morphine 2 MG/ML injection 2 mg (2 mg Intravenous Given 02/25/20 2151)   Note:  This document was prepared using Dragon voice recognition software and may include unintentional dictation errors.  Nanda Quinton, MD, [facility]    Namiah Dunnavant, [facility], MD 02/26/20 [phone]

## 2020-02-25 NOTE — ED Notes (Signed)
Pure wick applied, pt states no relief since pain med, continues to yell out intermittently, alert and oriented

## 2020-02-25 NOTE — ED Notes (Signed)
Report called to [facility] charge nurse RN

## 2020-02-25 NOTE — ED Triage Notes (Signed)
Pt arrives via [facility] from [facility] for eval of back pain. Pt resident at [facility]. Per [facility], pt has received 8 mg morphine, 2 mg valium, and 50 mcg fentanyl. Pt on 2 lpm o2 via [place] as a result of resp depression secondary to narcotic admin. Pt AOx4 at baseline, VSS.

## 2020-02-25 NOTE — H&P (Signed)
EARLY ORD PIR:[id number] DOB: [birth year] DOA: 02/25/2020    PCP: Harrison Mons, PA   Outpatient Specialists:    NS Dr. [name] Patient arrived to ER on 02/25/20 at 0920 Referred by Attending Maudie Flakes, MD   Patient coming from:  From facility [facility]  Chief Complaint:   Chief Complaint  Patient presents with  . Back Pain    HPI: [name] is a 86 y.o. female with medical history significant of T12 compression fracture, HTN    Presented with severe pain in her back radiating down her legs.  She denies any numbness.  But pain is severe and worse with any movement.  Of note as per review of records PT noted patient was crying out in pain at the time of her discharge but was able to be redirected. Otherwise no bowel or bladder incontinence. Patient has been taking oxycodone for pain control. Imaging showed no change to explain her pain. Pt sp T12 kyphoplasty vertebroplasty by Dr. Trenton Gammon last week. Patient originally had T12 vertibral compression fracture in June after a fall.  severe T12 compression fracture  with osteonecrosis and retropulsion resulting in moderate mass-effect  Initially patient was taken to [place] where she received 8 mg of morphine 2 mg of Valium and 50 mcg of fentanyl resulting in respiratory depression secondary to narcotic administration but continued to scream out in pain. She was transferred to [facility], ER for obtaining MRI  she is wheal chair bound  Infectious risk factors:  Reports back pain    Has been vaccinated against COVID    Initial COVID TEST  NEGATIVE   Lab Results  Component Value Date   SARSCOV2NAA NEGATIVE 02/25/2020   [place] NEGATIVE 02/20/2020   [place] NEGATIVE 01/05/2020   [place] NEGATIVE 12/31/2019    Regarding pertinent Chronic problems:      Hyperlipidemia - on statins zocor Lipid Panel     Component Value Date/Time   CHOL 176 03/25/2013 0851   TRIG 118.0  03/25/2013 0851   HDL 70.10 03/25/2013 0851   CHOLHDL 3 03/25/2013 0851   VLDL 23.6 03/25/2013 0851   LDLCALC 82 03/25/2013 0851     HTN on Losartan, hydralazine, lasix   Chronic anemia - baseline hg Hemoglobin & Hematocrit  Recent Labs    01/04/20 0837 02/20/20 1002 02/25/20 1125  HGB 12.1 11.7* 10.1*    While in ER:  unable to control pain despite multiple doses  Of IV morphine   ER Provider Called: Neurosurgery    Dr.Dawley They Recommend admit to medicine  Will follow    Hospitalist was called for admission for back pain   The following Work up has been ordered so far:  Orders Placed This Encounter  Procedures  . SARS Coronavirus 2 by RT PCR (hospital order, performed in [facility] hospital lab) Nasopharyngeal Nasopharyngeal Swab  . CT Thoracic Spine Wo Contrast  . CT Lumbar Spine Wo Contrast  . DG Hip Unilat W or Wo Pelvis 2-3 Views Right  . MR THORACIC SPINE WO CONTRAST  . Basic metabolic panel  . CBC with Differential  . Monitor O2 SATs  . Consult for [place] Admission  ALL PATIENTS BEING ADMITTED/HAVING PROCEDURES NEED COVID-19 SCREENING  . Consult to neurosurgery  ALL PATIENTS BEING ADMITTED/HAVING PROCEDURES NEED COVID-19 SCREENING  . Saline lock IV     Following Medications were ordered in ER: Medications  morphine 2 MG/ML injection 2 mg (2 mg  Intravenous Not Given 02/25/20 1851)  morphine 4 MG/ML injection 4 mg (4 mg Intravenous Given 02/25/20 0940)  ondansetron (ZOFRAN) injection 4 mg (4 mg Intravenous Given 02/25/20 0939)  diazepam (VALIUM) injection 2.5 mg (2.5 mg Intravenous Given 02/25/20 1011)  morphine 4 MG/ML injection 4 mg (4 mg Intravenous Given 02/25/20 1118)  morphine 4 MG/ML injection 4 mg (4 mg Intravenous Given 02/25/20 1702)    Significant initial  Findings: Abnormal Labs Reviewed  BASIC METABOLIC PANEL - Abnormal; Notable for the following components:      Result Value   Chloride 97 (*)    Glucose, Bld 110 (*)    BUN 24 (*)     All other components within normal limits  CBC WITH DIFFERENTIAL/PLATELET - Abnormal; Notable for the following components:   RBC 3.29 (*)    Hemoglobin 10.1 (*)    HCT 31.5 (*)    All other components within normal limits    Otherwise labs showing:    Recent Labs  Lab 02/20/20 1002 02/25/20 1125  NA 137 136  K 3.9 3.9  CO2 27 29  GLUCOSE 121* 110*  BUN 40* 24*  CREATININE 1.19* 0.78  CALCIUM 10.1 9.1    Cr   stable,    Lab Results  Component Value Date   CREATININE 0.78 02/25/2020   CREATININE 1.19 (H) 02/20/2020   CREATININE 0.64 01/04/2020    No results for input(s): AST, ALT, ALKPHOS, BILITOT, PROT, ALBUMIN in the last 168 hours. Lab Results  Component Value Date   CALCIUM 9.1 02/25/2020   PHOS 3.1 01/04/2020     WBC      Component Value Date/Time   WBC 10.5 02/25/2020 1125   ANC    Component Value Date/Time   NEUTROABS 7.7 02/25/2020 1125   ALC No components found for: LYMPHAB   Plt: Lab Results  Component Value Date   PLT 288 02/25/2020       COVID-19 Labs  No results for input(s): DDIMER, FERRITIN, LDH, CRP in the last 72 hours.  Lab Results  Component Value Date   SARSCOV2NAA NEGATIVE 02/25/2020   SARSCOV2NAA NEGATIVE 02/20/2020   [place] NEGATIVE 01/05/2020   [place] NEGATIVE 12/31/2019     HG/HCT   Down   from baseline see below    Component Value Date/Time   HGB 10.1 (L) 02/25/2020 1125   HCT 31.5 (L) 02/25/2020 1125     ECG: Ordered   ordered     CT thoracic spinal canal stenosis and there was mild spinal spinal cord mass effect at this site on the recent prior MRI. Right hip non-acute   MR thoracic spine unchaged Severe compression fracture T12 vertebral augmentation unchanged from prior studies. 7 mm retropulsion of T12 into the spinal canal causing severe spinal stenosis and cord compression, unchanged from the prior MRI. No cord signal abnormality.  ED Triage Vitals  Enc Vitals Group     BP 02/25/20 0922  (!) 162/83     Pulse Rate 02/25/20 0922 77     Resp 02/25/20 1314 14     Temp 02/25/20 0922 98.5 F (36.9 C)     Temp Source 02/25/20 0922 Oral     SpO2 02/25/20 0922 93 %     Weight 02/25/20 0925 160 lb (72.6 kg)     Height 02/25/20 0925 5' 3"  (1.6 m)     Head Circumference --      Peak Flow --      Pain Score 02/25/20 0925  10     Pain Loc --      Pain Edu? --      Excl. in [place]? --   TMAX(24)@       Latest  Blood pressure (!) 151/77, pulse 68, temperature 98.3 F (36.8 C), temperature source Oral, resp. rate 14, height 5' 3"  (1.6 m), weight 72.6 kg, SpO2 97 %.    Review of Systems:    Pertinent positives include: back pain   Constitutional:  No weight loss, night sweats, Fevers, chills, fatigue, weight loss  HEENT:  No headaches, Difficulty swallowing,Tooth/dental problems,Sore throat,  No sneezing, itching, ear ache, nasal congestion, post nasal drip,  Cardio-vascular:  No chest pain, Orthopnea, PND, anasarca, dizziness, palpitations.no Bilateral lower extremity swelling  GI:  No heartburn, indigestion, abdominal pain, nausea, vomiting, diarrhea, change in bowel habits, loss of appetite, melena, blood in stool, hematemesis Resp:  no shortness of breath at rest. No dyspnea on exertion, No excess mucus, no productive cough, No non-productive cough, No coughing up of blood.No change in color of mucus.No wheezing. Skin:  no rash or lesions. No jaundice GU:  no dysuria, change in color of urine, no urgency or frequency. No straining to urinate.  No flank pain.  Musculoskeletal:  No joint pain or no joint swelling. No decreased range of motion.  Psych:  No change in mood or affect. No depression or anxiety. No memory loss.  Neuro: no localizing neurological complaints, no tingling, no weakness, no double vision, no gait abnormality, no slurred speech, no confusion  All systems reviewed and apart from [place] all are negative  Past Medical History:   Past Medical History:    Diagnosis Date  . Allergy   . Arthritis   . Depression   . Endometriosis   . GERD (gastroesophageal reflux disease)   . Hearing loss 12/08/2011   bil, hearing aids  . Heart murmur   . Hyperlipidemia   . Hypertension   . Osteoporosis   . Ovarian cyst   . Polymyalgia rheumatica ([place])   . Post herpetic neuralgia   . S/P right THA, AA 12/16/2011  . Shingles 2020  . Skin cancer   . Spinal stenosis      Past Surgical History:  Procedure Laterality Date  . CATARACT EXTRACTION, BILATERAL  12-08-11   bil  . DILATION AND CURETTAGE OF UTERUS    . HERNIA REPAIR    . HYSTEROSCOPY    . KYPHOPLASTY N/A 02/20/2020   Procedure: THORACIC TWELVE KYPHOPLASTY;  Surgeon: Earnie Larsson, MD;  Location: [facility];  Service: Neurosurgery;  Laterality: N/A;  3C  . OOPHORECTOMY     bilateral  . STERIOD INJECTION  12/09/11   to spine for rt calf pain and spasms  . TIBIA FRACTURE SURGERY     trauma  . TONSILLECTOMY AND ADENOIDECTOMY    . TOTAL HIP ARTHROPLASTY  12/16/2011   Procedure: TOTAL HIP ARTHROPLASTY ANTERIOR APPROACH;  Surgeon: Mauri Pole, MD;  Location: [facility];  Service: Orthopedics;  Laterality: Right;    Social History:  Ambulatory    wheelchair bound,     reports that she has quit smoking. She has never used smokeless tobacco. She reports that she does not drink alcohol and does not use drugs.   Family History:   Family History  Problem Relation Age of Onset  . Stroke Sister   . Diabetes Daughter   . Cancer Father        esophogeal  . Cancer Paternal Aunt  stomach  . Breast cancer Maternal Grandmother        Age 19  . Stroke Mother     Allergies: Allergies  Allergen Reactions  . Cartia Xt [Diltiazem] Other (See Comments)    BRADYCARDIA IN 40'S  . Codeine Other (See Comments)    syncope  . Lisinopril-Hydrochlorothiazide Cough  . Mercury Rash  . Sulfa Antibiotics   . Sulfonamide Derivatives Rash    swelling    Prior to Admission medications   Medication Sig  Start Date End Date Taking? Authorizing Provider  acetaminophen (TYLENOL) 500 MG tablet Take 2 tablets (1,000 mg total) by mouth every 8 (eight) hours. 01/06/20   Mercy Riding, MD  bisacodyl (DULCOLAX) 5 MG EC tablet Take 1 tablet (5 mg total) by mouth daily as needed for moderate constipation. 02/03/16   Tomma Rakers, MD  calcitonin, salmon, (MIACALCIN/FORTICAL) 200 UNIT/ACT nasal spray Place 1 spray into alternate nostrils daily at 6 PM. 01/06/20   Mercy Riding, MD  Cholecalciferol (VITAMIN D3) 50 MCG (2000 UT) TABS Take 2,000 Units by mouth daily.    [provider]  dexamethasone (DECADRON) 4 MG tablet Take 1 tablet (4 mg total) by mouth daily. 01/06/20   Mercy Riding, MD  docusate sodium (COLACE) 100 MG capsule Take 2 capsules (200 mg total) by mouth 2 (two) times daily. 01/06/20   Mercy Riding, MD  DULoxetine (CYMBALTA) 30 MG capsule Take 1 capsule (30 mg total) by mouth daily. 01/06/20   Mercy Riding, MD  enoxaparin (LOVENOX) 40 MG/0.4ML injection Inject 0.4 mLs (40 mg total) into the skin daily for 21 days. 01/06/20 02/25/20  Mercy Riding, MD  feeding supplement, ENSURE ENLIVE, (ENSURE ENLIVE) LIQD Take 237 mLs by mouth 2 (two) times daily between meals. Give 2 week supply 02/17/16   Thurnell Lose, MD  furosemide (LASIX) 20 MG tablet Take 20 mg by mouth daily. 02/07/20   [provider]  gabapentin (NEURONTIN) 300 MG capsule Take 1 capsule (300 mg total) by mouth 2 (two) times daily with breakfast and lunch AND 2 capsules (600 mg total) at bedtime. 01/06/20   Mercy Riding, MD  hydrALAZINE (APRESOLINE) 25 MG tablet Take 1 tablet (25 mg total) by mouth every 8 (eight) hours. 12/09/19 02/25/20  Elodia Florence., MD  losartan (COZAAR) 50 MG tablet Take 50 mg by mouth daily. 10/15/19   [provider]  meloxicam (MOBIC) 7.5 MG tablet Take 7.5 mg by mouth 2 (two) times daily. 11/03/19   [provider]  mirtazapine (REMERON) 15 MG tablet Take 15 mg by  mouth at bedtime. 10/07/19   [provider]  NARCAN 4 MG/0.1ML LIQD nasal spray kit 1 spray once. 02/15/20   [provider]  nitrofurantoin, macrocrystal-monohydrate, (MACROBID) 100 MG capsule Take 100 mg by mouth 2 (two) times daily. 02/10/20   [provider]  omeprazole (PRILOSEC) 20 MG capsule Take 1 capsule (20 mg total) by mouth daily. 08/04/14   Swords, Darrick Penna, MD  oxyCODONE (OXYCONTIN) 10 mg 12 hr tablet Take 1 tablet (10 mg total) by mouth every 12 (twelve) hours. 01/06/20   Mercy Riding, MD  oxyCODONE (ROXICODONE) 5 MG immediate release tablet Take 1 tablet (5 mg total) by mouth every 6 (six) hours as needed for severe pain. 02/13/20 03/14/20  Wyvonnia Dusky, MD  polyethylene glycol powder (MIRALAX) 17 GM/SCOOP powder Take 17 g by mouth 2 (two) times daily as needed for moderate  constipation. 01/06/20   Mercy Riding, MD  predniSONE (DELTASONE) 5 MG tablet Take 5 mg by mouth daily. 02/06/20   [provider]  senna-docusate (SENOKOT-S) 8.6-50 MG tablet Take 1 tablet by mouth 2 (two) times daily between meals as needed for mild constipation. 01/06/20   Mercy Riding, MD  simvastatin (ZOCOR) 20 MG tablet TAKE 1 TABLET EVERY DAY Patient taking differently: Take 20 mg by mouth every evening.  09/29/13   Lisabeth Pick, MD   Physical Exam: Vitals with BMI 02/25/2020 02/25/2020 02/25/2020  Height - - 5' 3"   Weight - - 160 lbs  BMI - - 71.06  Systolic 269 485 -  Diastolic 77 69 -  Pulse 68 67 -  Some encounter information is confidential and restricted. Go to Review Flowsheets activity to see all data.     1. General:  In Acute distress complaining of severe pain    Chronically ill -appearing 2. Psychological: Alert and  Oriented to self 3. Head/ENT:    Dry Mucous Membranes                          Head Non traumatic, neck supple                           Poor Dentition 4. SKIN:   decreased Skin turgor,  Skin clean Dry and intact no rash 5. Heart: Regular  rate and rhythm no  Murmur, no Rub or gallop 6. Lungs:  no wheezes or crackles   7. Abdomen: Soft, non-tender, Non distended bowel sounds present 8. Lower extremities: no clubbing, cyanosis, no  edema 9. Neurologically Grossly intact, moving all 4 extremities equally   10. MSK: Normal range of motion   All other LABS:     Recent Labs  Lab 02/20/20 1002 02/25/20 1125  WBC 11.4* 10.5  NEUTROABS  --  7.7  HGB 11.7* 10.1*  HCT 38.1 31.5*  MCV 98.4 95.7  PLT 294 288     Recent Labs  Lab 02/20/20 1002 02/25/20 1125  NA 137 136  K 3.9 3.9  CL 98 97*  CO2 27 29  GLUCOSE 121* 110*  BUN 40* 24*  CREATININE 1.19* 0.78  CALCIUM 10.1 9.1     No results for input(s): AST, ALT, ALKPHOS, BILITOT, PROT, ALBUMIN in the last 168 hours.     Cultures:    Component Value Date/Time   SDES BLOOD RIGHT ARM 02/14/2016 2341   SPECREQUEST BOTTLES DRAWN AEROBIC AND ANAEROBIC 5ML 02/14/2016 2341   CULT NO GROWTH 5 DAYS 02/14/2016 2341   REPTSTATUS 02/20/2016 FINAL 02/14/2016 2341     Radiological Exams on Admission: CT Thoracic Spine Wo Contrast  Result Date: 02/25/2020 CLINICAL DATA:  Spine fracture, thoracic, traumatic. Additional provided: Surgery on Monday for back, pain despite meds. EXAM: CT THORACIC SPINE WITHOUT CONTRAST TECHNIQUE: Multidetector CT images of the thoracic were obtained using the standard protocol without intravenous contrast. COMPARISON:  Thoracic spine MRI 12/31/2019, intra procedural fluoroscopic images of the lower thoracic spine from vertebral augmentation performed 02/20/2020. FINDINGS: Alignment: 7 mm bony retropulsion at the T12 level, which may be slightly progressed as compared to the MRI of 12/31/2019. No significant spondylolisthesis. Vertebrae: Redemonstrated severe T12 compression fracture with 7 mm bony retropulsion. There has been interval vertebral augmentation at this level. Vertebral body height is otherwise maintained. No interval sure is identified.  Chronic deformity of the T11  spinous process and lamina which is at least partially congenital. Paraspinal and other soft tissues: Redemonstrated severe T12 Disc levels: Moderate disc space narrowing throughout the thoracic spine. At T6-T7, there is a somewhat prominent calcified central disc herniate which contributes to mild spinal canal stenosis and resultant mild spinal cord mass effect on the prior MRI of 12/31/2019. At the T12 level, bony retropulsion contributes to severe spinal canal stenosis with likely persistent mass effect upon the conus medullaris. No appreciable significant spinal canal stenosis at the remaining levels. IMPRESSION: Redemonstrated severe T12 vertebral compression fracture. There has been interval vertebral augmentation since the thoracic spine MRI of 12/31/2019. There is 7 mm bony retropulsion at this level which may be slightly progressed. Severe spinal canal stenosis at this level with likely persistent impingement of the conus medullaris. Thoracic spine MRI may be obtained to assess for spinal cord signal abnormality, as clinically warranted. No evidence of interval thoracic spine fracture. Thoracic spondylosis as described. Most notably at T6-T7, a somewhat prominent central calcified disc herniation contributes to mild spinal canal stenosis and there was mild spinal spinal cord mass effect at this site on the recent prior MRI. Electronically Signed   By: Kellie Simmering DO   On: 02/25/2020 10:58   CT Lumbar Spine Wo Contrast  Result Date: 02/25/2020 CLINICAL DATA:  Lumbar radiculopathy, trauma. EXAM: CT LUMBAR SPINE WITHOUT CONTRAST TECHNIQUE: Multidetector CT imaging of the lumbar spine was performed without intravenous contrast administration. Multiplanar CT image reconstructions were also generated. COMPARISON:  Lumbar spine MRI 12/31/2019, CT of the lumbar spine 12/31/2019 FINDINGS: Segmentation: Lumbar dextrocurvature.  5 lumbar vertebrae. Alignment: Trace L1-L2, L4-L5 and  L5-S1 grade 1 anterolisthesis. Vertebrae: Please refer to concurrently performed thoracic spine CT for description of findings at the T12 level. Lumbar vertebral body height is maintained. Redemonstrated subacute bilateral L5 transverse process fractures (series 3, image 88). No evidence of interval fracture to the lumbar spine. Paraspinal and other soft tissues: Aortoiliac atherosclerosis. Atrophy of the lumbar paraspinal musculature. Disc levels: Multilevel disc space narrowing and disc vacuum phenomenon. Disc space narrowing is greatest at L1-L2, L2-L3 and L4-L5 (moderate in severity at these levels). Multilevel disc bulges and endplate spurring as well as multilevel facet arthrosis and ligamentum flavum hypertrophy. No more than mild appreciable spinal canal stenosis at any level. Unchanged multilevel neural foraminal narrowing. Multilevel neural foraminal narrowing greatest bilaterally at L4-L5 (moderately severe at these sites). IMPRESSION: Please refer to the concurrently performed thoracic spine CT for a description of findings at the T12 level. Redemonstrated subacute bilateral L5 transverse process fractures. No evidence of interval fracture to the lumbar spine. Lumbar spondylosis as described. No more than mild appreciable spinal canal stenosis at any level. Multilevel neural foraminal narrowing. Most notably there is moderate/severe bilateral neural foraminal narrowing at L4-L5. Lumbar dextrocurvature. Trace multilevel spondylolisthesis as described. Aortic Atherosclerosis (ICD10-I70.0). Electronically Signed   By: Kellie Simmering DO   On: 02/25/2020 11:13   MR THORACIC SPINE WO CONTRAST  Result Date: 02/25/2020 CLINICAL DATA:  Posttraumatic spinal fracture. T12 compression fracture identified on Nov 24, 2019. Multiple follow-up studies. Subsequent kyphoplasty T12 on 02/20/2020 EXAM: MRI THORACIC SPINE WITHOUT CONTRAST TECHNIQUE: Multiplanar, multisequence MR imaging of the thoracic spine was performed.  No intravenous contrast was administered. COMPARISON:  CT thoracic spine 02/25/2020. MRI thoracic spine 12/31/2019 FINDINGS: Alignment:  2 mm anterolisthesis C7-T1.  Remaining alignment normal. Vertebrae: Severe compression fracture of T12 with vertebral augmentation. Cement in satisfactory position. Left-sided approach. There is 7 mm  of retropulsion of T12 vertebral body into the canal causing severe spinal stenosis. This is similar to the prior MRI of 12/31/2019 No other thoracic fracture identified. Negative for metastatic disease. Cord: Severe spinal stenosis and cord compression at T12 unchanged from prior MRI of 12/31/2019. No cord signal abnormality. Large calcified disc protrusion on the right T6-7 with significant flattening of the cord ventrally and on the right side unchanged from the prior MRI. No cord signal abnormality. Paraspinal and other soft tissues: Negative for paraspinous mass edema or fluid collection. Disc levels: C7-T1: 2 mm anterolisthesis with facet degeneration. This is causing moderate foraminal stenosis bilaterally. T6-7: Large calcified disc protrusion to the right of midline with extensive cord flattening. There is sufficient CSF posterior to the cord at this level. No other levels of disc protrusion or stenosis in the thoracic spine. IMPRESSION: Severe compression fracture T12 vertebral augmentation unchanged from prior studies. 7 mm retropulsion of T12 into the spinal canal causing severe spinal stenosis and cord compression, unchanged from the prior MRI. No cord signal abnormality. Large calcified disc protrusion on the right at T6-7 with cord flattening, unchanged from prior studies. Electronically Signed   By: Franchot Gallo M.D.   On: 02/25/2020 16:11   DG Hip Unilat W or Wo Pelvis 2-3 Views Right  Result Date: 02/25/2020 CLINICAL DATA:  Right hip pain. Recent T12 back surgery. No reported injury. EXAM: DG HIP (WITH OR WITHOUT PELVIS) 2-3V RIGHT COMPARISON:  09/28/2017 hip  radiographs FINDINGS: Right total hip arthroplasty with no evidence of hardware fracture or loosening. No right hip dislocation. No acute osseous fracture. No pelvic diastasis. Mild left hip osteoarthritis. Degenerative changes in the visualized lower lumbar spine. No suspicious focal osseous lesions. IMPRESSION: Right total hip arthroplasty, with no evidence of hardware complication. No acute osseous abnormality. No right hip dislocation. Electronically Signed   By: Ilona Sorrel M.D.   On: 02/25/2020 11:20    Chart has been reviewed    Assessment/Plan  86 y.o. female with medical history significant of T12 compression fracture, HTN Admitted for severe back pain in the setting of T12 compression fracture sp kyphoplasty  Present on Admission: . Back pain - pain management, OxyContin BID, oxycodone PRN,  Morphine PRN, Robaxin  . Anemia - chronic , obtain anemia panel  . Compression fracture of T12 vertebra (HCC) - appreciate neurosurgery consult, sp kyphoplasty  . GERD (gastroesophageal reflux disease) - chronic stable  . Hyperlipidemia - stable continue Zocor  . Hypertension - stable continue home meds    Other plan as per orders.  DVT prophylaxis:  SCD    Code Status:    Code Status: Prior DNR/DNI as per family  I had personally discussed CODE STATUS with family   Family Communication:   Family not at  Bedside  plan of care was discussed on the phone with  Son, Daughters Disposition Plan:                            Back to current facility when stable                            *Following barriers for discharge:  Pain controlled with PO medications                                                            Will need to be able to tolerate PO                                                        Will need consultants to evaluate patient prior to discharge                        Would benefit from PT/OT eval prior  to DC  Ordered                                     Transition of care consulted                                       Palliative care    consulted      Consults called:   Neurosurgery is aware Admission status:  ED Disposition    ED Disposition Condition [place]: [place] [[id number]]  Level of Care: Telemetry Medical [104]  I expect the patient will be discharged within 24 hours: No (not a candidate for 5C-Observation unit)  Covid Evaluation: Confirmed COVID Negative  Diagnosis: Back pain [[id number]]  Admitting Physician: Toy Baker [[id number]]  Attending Physician: Toy Baker [[id number]]      Obs   Level of care     tele  For 12H   Lab Results  Component Value Date   [place] 02/25/2020     Precautions: admitted as Covid Negative      PPE: Used by the provider:   P100  eye Goggles,  Gloves     Leshawn Straka 02/25/2020, 8:50 PM   [facility]     after 2 AM please page floor coverage PA If 7AM-7PM, please contact the day team taking care of the patient using [email or web address]   Patient was evaluated in the context of the global COVID-19 pandemic, which necessitated consideration that the patient might be at risk for infection with the SARS-CoV-2 virus that causes COVID-19. Institutional protocols and algorithms that pertain to the evaluation of patients at risk for COVID-19 are in a state of rapid change based on information released by regulatory bodies including the CDC and federal and state organizations. These policies and algorithms were followed during the patient's care.

## 2020-02-26 ENCOUNTER — Observation Stay (HOSPITAL_COMMUNITY): Payer: Medicare HMO

## 2020-02-26 DIAGNOSIS — I340 Nonrheumatic mitral (valve) insufficiency: Secondary | ICD-10-CM | POA: Diagnosis not present

## 2020-02-26 DIAGNOSIS — Z515 Encounter for palliative care: Secondary | ICD-10-CM

## 2020-02-26 DIAGNOSIS — I361 Nonrheumatic tricuspid (valve) insufficiency: Secondary | ICD-10-CM

## 2020-02-26 DIAGNOSIS — S22080A Wedge compression fracture of T11-T12 vertebra, initial encounter for closed fracture: Secondary | ICD-10-CM | POA: Diagnosis not present

## 2020-02-26 DIAGNOSIS — Z7189 Other specified counseling: Secondary | ICD-10-CM | POA: Diagnosis not present

## 2020-02-26 DIAGNOSIS — M546 Pain in thoracic spine: Secondary | ICD-10-CM

## 2020-02-26 DIAGNOSIS — I350 Nonrheumatic aortic (valve) stenosis: Secondary | ICD-10-CM | POA: Diagnosis not present

## 2020-02-26 DIAGNOSIS — I10 Essential (primary) hypertension: Secondary | ICD-10-CM | POA: Diagnosis not present

## 2020-02-26 DIAGNOSIS — G8929 Other chronic pain: Secondary | ICD-10-CM | POA: Diagnosis not present

## 2020-02-26 DIAGNOSIS — Z66 Do not resuscitate: Secondary | ICD-10-CM

## 2020-02-26 DIAGNOSIS — S22080G Wedge compression fracture of T11-T12 vertebra, subsequent encounter for fracture with delayed healing: Secondary | ICD-10-CM | POA: Diagnosis not present

## 2020-02-26 LAB — COMPREHENSIVE METABOLIC PANEL
ALT: 12 U/L (ref 0–44)
AST: 16 U/L (ref 15–41)
Albumin: 3.1 g/dL — ABNORMAL LOW (ref 3.5–5.0)
Alkaline Phosphatase: 93 U/L (ref 38–126)
Anion gap: 10 (ref 5–15)
BUN: 17 mg/dL (ref 8–23)
CO2: 30 mmol/L (ref 22–32)
Calcium: 9.4 mg/dL (ref 8.9–10.3)
Chloride: 95 mmol/L — ABNORMAL LOW (ref 98–111)
Creatinine, Ser: 0.7 mg/dL (ref 0.44–1.00)
GFR calc Af Amer: >60 mL/min (ref >60)
GFR calc non Af Amer: >60 mL/min (ref >60)
Glucose, Bld: 117 mg/dL — ABNORMAL HIGH (ref 70–99)
Potassium: 4 mmol/L (ref 3.5–5.1)
Sodium: 135 mmol/L (ref 135–145)
Total Bilirubin: 0.6 mg/dL (ref 0.3–1.2)
Total Protein: 6.3 g/dL — ABNORMAL LOW (ref 6.5–8.1)

## 2020-02-26 LAB — CBC WITH DIFFERENTIAL/PLATELET
Abs Immature Granulocytes: 0.06 10*3/uL (ref 0.00–0.07)
Basophils Absolute: 0.1 10*3/uL (ref 0.0–0.1)
Basophils Relative: 1 %
Eosinophils Absolute: 0.6 10*3/uL — ABNORMAL HIGH (ref 0.0–0.5)
Eosinophils Relative: 5 %
HCT: 33.4 % — ABNORMAL LOW (ref 36.0–46.0)
Hemoglobin: 10.2 g/dL — ABNORMAL LOW (ref 12.0–15.0)
Immature Granulocytes: 1 %
Lymphocytes Relative: 11 %
Lymphs Abs: 1.2 10*3/uL (ref 0.7–4.0)
MCH: 29.2 pg (ref 26.0–34.0)
MCHC: 30.5 g/dL (ref 30.0–36.0)
MCV: 95.7 fL (ref 80.0–100.0)
Monocytes Absolute: 1 10*3/uL (ref 0.1–1.0)
Monocytes Relative: 9 %
Neutro Abs: 8.2 10*3/uL — ABNORMAL HIGH (ref 1.7–7.7)
Neutrophils Relative %: 73 %
Platelets: 295 10*3/uL (ref 150–400)
RBC: 3.49 MIL/uL — ABNORMAL LOW (ref 3.87–5.11)
RDW: 13.3 % (ref 11.5–15.5)
WBC: 11.1 10*3/uL — ABNORMAL HIGH (ref 4.0–10.5)
nRBC: 0 % (ref 0.0–0.2)

## 2020-02-26 LAB — ECHOCARDIOGRAM COMPLETE
AR max vel: 1.27 cm2
AV Area VTI: 1.36 cm2
AV Area mean vel: 1.18 cm2
AV Mean grad: 28 mmHg
AV Peak grad: 41.4 mmHg
Ao pk vel: 3.22 m/s
Area-P 1/2: 2.3 cm2
Height: 63 in
S' Lateral: 2.85 cm
Weight: 2560 oz

## 2020-02-26 LAB — TSH: TSH: 4.169 u[IU]/mL (ref 0.350–4.500)

## 2020-02-26 LAB — MAGNESIUM: Magnesium: 1.7 mg/dL (ref 1.7–2.4)

## 2020-02-26 LAB — PHOSPHORUS: Phosphorus: 3.9 mg/dL (ref 2.5–4.6)

## 2020-02-26 MED ORDER — LOSARTAN POTASSIUM 50 MG PO TABS
50.0000 mg | ORAL_TABLET | Freq: Every day | ORAL | Status: DC
  Administered 2020-02-26 – 2020-03-01 (×5): 50 mg via ORAL
  Filled 2020-02-26 (×6): qty 1

## 2020-02-26 MED ORDER — OXYCODONE HCL 5 MG PO TABS
5.0000 mg | ORAL_TABLET | ORAL | Status: DC | PRN
  Administered 2020-02-26 – 2020-02-27 (×2): 5 mg via ORAL
  Filled 2020-02-26 (×2): qty 1

## 2020-02-26 MED ORDER — ORPHENADRINE CITRATE ER 100 MG PO TB12
100.0000 mg | ORAL_TABLET | Freq: Two times a day (BID) | ORAL | Status: AC
  Administered 2020-02-26 – 2020-02-27 (×2): 100 mg via ORAL
  Filled 2020-02-26 (×2): qty 1

## 2020-02-26 MED ORDER — MORPHINE SULFATE (PF) 2 MG/ML IV SOLN
2.0000 mg | INTRAVENOUS | Status: DC | PRN
  Administered 2020-02-28 – 2020-02-29 (×2): 2 mg via INTRAVENOUS
  Filled 2020-02-26 (×2): qty 1

## 2020-02-26 MED ORDER — PREDNISONE 5 MG PO TABS
5.0000 mg | ORAL_TABLET | Freq: Every day | ORAL | Status: DC
  Administered 2020-02-27: 5 mg via ORAL
  Filled 2020-02-26: qty 1

## 2020-02-26 MED ORDER — ORPHENADRINE CITRATE 30 MG/ML IJ SOLN: 60.0000 mg | Freq: Two times a day (BID) | INTRAMUSCULAR | Status: DC

## 2020-02-26 NOTE — Care Management Obs Status (Signed)
[place] NOTIFICATION   Patient Details  Name: [name] MRN: [id number] Date of Birth: Mar 19, 1923   Medicare Observation Status Notification Given:  Yes    Joanne Chars, LCSW 02/26/2020, 2:35 PM

## 2020-02-26 NOTE — Evaluation (Signed)
Physical Therapy Evaluation Patient Details Name: [name] MRN: [id number] DOB: [birth year] Today's Date: 02/26/2020   History of Present Illness  86 y.o. female admitted on 02/25/20 for back pain.  Pt with recent T12 kyphoplasty vertebroplasty on 02/20/20.  Neurosurgery re-consulted and CT and MRI show stable appearance of fx.  Palliative care has also been consulted.  Pt with significant PMH of R THA, polymyalgia rheumatic, HTN, hearing loss.    Clinical Impression  Pt crying out with just waking her up, not moving yet.  She is redirectable, but only for ~15 seconds before crying out again.  Some combination of pain, fear/anxiety, and confusion.  We were able to get her up to EOB and stand x2 with mod to max assist and support of the RW.  Spoke with palliative NP re: session.   PT to follow acutely for deficits listed below.    Follow Up Recommendations SNF    Equipment Recommendations  Wheelchair (measurements PT);Hospital bed;Other (comment) (hoyer lift)    Recommendations for Other Services       Precautions / Restrictions Precautions Precautions: Back;Fall Required Braces or Orthoses: Other Brace Spinal Brace: Lumbar corset Other Brace: not here this admission.       Mobility  Bed Mobility Overal bed mobility: Needs Assistance Bed Mobility: Rolling;Sidelying to Sit;Sit to Sidelying Rolling: Mod assist;+2 for physical assistance Sidelying to sit: Mod assist;+2 for physical assistance     Sit to sidelying: Max assist;+2 for physical assistance General bed mobility comments: Heavy mod to max assist to roll bil, come to sitting from side lying and scoot out to EOB.   Transfers Overall transfer level: Needs assistance Equipment used: Rolling walker (2 wheeled) Transfers: Sit to/from Stand Sit to Stand: +2 physical assistance;Mod assist;Max assist         General transfer comment: Pt able to stand from elevated HOB with bil hands on RW, knees and feet blocked.  Unable to  get to fully upright stand, but does lift bottom off of bed to be able to preform peri care.   Ambulation/Gait                Stairs            Wheelchair Mobility    Modified Rankin (Stroke Patients Only)       Balance Overall balance assessment: Needs assistance Sitting-balance support: Feet supported;Bilateral upper extremity supported Sitting balance-Leahy Scale: Zero Sitting balance - Comments: up to max assist sitting EOB, as good as mod assit. R lateral lean.  Postural control: Right lateral lean Standing balance support: Bilateral upper extremity supported Standing balance-Leahy Scale: Zero Standing balance comment: Poor to zero mod to max assist to stand with two person assist and RW.                              Pertinent Vitals/Pain Pain Assessment: Faces Faces Pain Scale: Hurts worst Pain Location: back Pain Descriptors / Indicators: Crying;Grimacing;Guarding Pain Intervention(s): Limited activity within patient's tolerance;Monitored during session;Repositioned    Home Living Family/patient expects to be discharged to:: [place]: Wheelchair - manual;Hospital bed;Other (comment)      Prior Function Level of Independence: Needs assistance   Gait / Transfers Assistance Needed: unsure of pt's level of assist PTA (difficult reporting due to confusion)  Hand Dominance   Dominant Hand: Right    Extremity/Trunk Assessment   Upper Extremity Assessment Upper Extremity Assessment: Defer to OT evaluation    Lower Extremity Assessment Lower Extremity Assessment: Generalized weakness    Cervical / Trunk Assessment Cervical / Trunk Assessment: Other exceptions Cervical / Trunk Exceptions: s/p recent kypohlasty  Communication   Communication: HOH  Cognition Arousal/Alertness: Awake/alert Behavior During Therapy: Restless Overall Cognitive Status: No family/caregiver  present to determine baseline cognitive functioning Area of Impairment: Orientation;Attention;Memory;Following commands;Safety/judgement;Awareness;Problem solving                 Orientation Level: Disoriented to;Place;Time;Situation Current Attention Level: Focused Memory: Decreased recall of precautions;Decreased short-term memory Following Commands: Follows one step commands inconsistently Safety/Judgement: Decreased awareness of safety;Decreased awareness of deficits Awareness: Intellectual Problem Solving: Slow processing;Requires verbal cues;Requires tactile cues;Difficulty sequencing;Decreased initiation General Comments: Pt groaning with and without touching her, seems anxiety related, crying out even without moving at one time reporting she was scared.  She is easily re-directed, but it only lasts ~15 seconds before she starts crying out.        General Comments General comments (skin integrity, edema, etc.): O2 sats dropped to 82% on RA at end of session when O2 was removed.  2 L O2 [place] re-aspplied at the end of the session to nose and sats came back up above 90%    Exercises     Assessment/Plan    PT Assessment Patient needs continued PT services  PT Problem List Decreased strength;Decreased activity tolerance;Decreased balance;Decreased mobility;Decreased coordination;Decreased cognition;Decreased knowledge of use of DME;Decreased knowledge of precautions;Pain;Cardiopulmonary status limiting activity       PT Treatment Interventions DME instruction;Gait training;Stair training;Functional mobility training;Therapeutic activities;Therapeutic exercise;Balance training;Neuromuscular re-education;Cognitive remediation;Patient/family education;Modalities    PT Goals (Current goals can be found in the Care Plan section)  Acute Rehab PT Goals Patient Stated Goal: unable to state PT Goal Formulation: Patient unable to participate in goal setting Time For Goal Achievement:  03/11/20 Potential to Achieve Goals: Fair    Frequency Min 2X/week   Barriers to discharge        Co-evaluation PT/OT/SLP Co-Evaluation/Treatment: Yes Reason for Co-Treatment: Complexity of the patient's impairments (multi-system involvement);Necessary to address cognition/behavior during functional activity;For patient/therapist safety;To address functional/ADL transfers PT goals addressed during session: Mobility/safety with mobility;Balance;Proper use of DME;Strengthening/ROM         AM-PAC PT "6 Clicks" Mobility  Outcome Measure Help needed turning from your back to your side while in a flat bed without using bedrails?: Total Help needed moving from lying on your back to sitting on the side of a flat bed without using bedrails?: Total Help needed moving to and from a bed to a chair (including a wheelchair)?: Total Help needed standing up from a chair using your arms (e.g., wheelchair or bedside chair)?: Total Help needed to walk in hospital room?: Total Help needed climbing 3-5 steps with a railing? : Total 6 Click Score: 6    End of Session Equipment Utilized During Treatment: Oxygen Activity Tolerance: Patient limited by pain;Other (comment) (limited by cognition) Patient left: in bed;with call bell/phone within reach;with bed alarm set Nurse Communication: Mobility status PT Visit Diagnosis: Muscle weakness (generalized) (M62.81);Difficulty in walking, not elsewhere classified (R26.2);Pain Pain - Right/Left:  (mid) Pain - part of body:  (back)    Time: 1601-0932 PT Time Calculation (min) (ACUTE ONLY): 31 min   Charges:   PT Evaluation $PT Eval Moderate Complexity: 1 Mod  Verdene Lennert, PT, DPT  Acute Rehabilitation [phone] pager [phone] office

## 2020-02-26 NOTE — Progress Notes (Signed)
Pt slept mostly today, missed lunch. Assisted pt for dinner, screaming of back pain when awake. Pt is forgetful and confused at times.

## 2020-02-26 NOTE — NC FL2 (Signed)
[place] MEDICAID FL2 LEVEL OF CARE SCREENING TOOL     IDENTIFICATION  Patient Name: [name] Birthdate: 05/01/1923 Sex: female Admission Date (Current Location): 02/25/2020  [facility] and [place] Number:  Herbalist and Address:  [facility], [place] [address] [place],  [address]      Provider Number: [id number]  Attending Physician Name and Address:  Mariel Aloe, MD  Relative Name and Phone Number:  Dianely Krehbiel, daughter, [phone]    Current Level of Care: Hospital Recommended Level of Care: [place] Prior Approval Number:    Date Approved/Denied:   PASRR Number: [id number]  Discharge Plan: SNF    Current Diagnoses: Patient Active Problem List   Diagnosis Date Noted  . Back pain 02/25/2020  . T12 compression fracture ([place]) 01/01/2020  . Intractable pain 12/31/2019  . Low back pain 12/31/2019  . Palliative care by specialist   . Goals of care, counseling/discussion   . DNR (do not resuscitate)   . Compression fracture of T12 vertebra ([place]) 12/04/2019  . Spinal fracture of T12 vertebra ([place]) 12/04/2019  . Fever, unspecified 02/15/2016  . Anxiety 02/15/2016  . Anemia 02/15/2016  . Hyponatremia 02/15/2016  . Bradycardia   . Constipation   . Depression   . MDD (major depressive disorder), recurrent severe, without psychosis ([place])   . Symptomatic bradycardia 01/30/2016  . Symptomatic anemia 01/29/2016  . Hypertension   . Osteoporosis   . Hyperlipidemia   . GERD (gastroesophageal reflux disease)   . Spinal stenosis   . Hearing loss 12/08/2011  . Osteoarthritis of hip 12/02/2011  . Endometriosis   . DIVERTICULOSIS-COLON 02/12/2009  . ALLERGIC RHINITIS 01/19/2007  . ESOPHAGEAL STRICTURE 01/19/2007    Orientation RESPIRATION BLADDER Height & Weight     Self, Place  O2 Continent Weight: 160 lb (72.6 kg) Height:  5\' 3"  (160 cm)  BEHAVIORAL SYMPTOMS/MOOD NEUROLOGICAL BOWEL NUTRITION STATUS       Continent Diet (Heart.  Please see discharge summary)  AMBULATORY STATUS COMMUNICATION OF NEEDS Skin   Extensive Assist Verbally Normal                       Personal Care Assistance Level of Assistance  Bathing, Feeding, Dressing Bathing Assistance: Maximum assistance Feeding assistance: Maximum assistance Dressing Assistance: Maximum assistance     Functional Limitations Info             [place]  PT (By licensed PT), OT (By licensed OT)     PT Frequency: 5x week OT Frequency: 5x week            Contractures Contractures Info: Not present    Additional Factors Info  Code Status, Allergies Code Status Info: DNR Allergies Info: Cartia Xt Diltiazem, Codeine, Lisinopril-hydrochlorothiazide, Mercury, Sulfa Antibiotics, Sulfonamide Derivatives           Current Medications (02/26/2020):  This is the current hospital active medication list Current Facility-Administered Medications  Medication Dose Route Frequency Provider Last Rate Last Admin  . acetaminophen (TYLENOL) tablet 650 mg  650 mg Oral Q6H PRN Toy Baker, MD       Or  . acetaminophen (TYLENOL) suppository 650 mg  650 mg Rectal Q6H PRN Doutova, Anastassia, MD      . bisacodyl (DULCOLAX) suppository 10 mg  10 mg Rectal Daily PRN Doutova, Anastassia, MD      . calcitonin (salmon) (MIACALCIN/FORTICAL) nasal spray 1 spray  1 spray  Alternating Nares Daily Toy Baker, MD   1 spray at 02/26/20 0959  . docusate sodium (COLACE) capsule 100 mg  100 mg Oral BID Toy Baker, MD   100 mg at 02/26/20 0956  . gabapentin (NEURONTIN) capsule 300 mg  300 mg Oral BID Toy Baker, MD   300 mg at 02/26/20 0956  . hydrALAZINE (APRESOLINE) tablet 25 mg  25 mg Oral Q8H Doutova, Anastassia, MD   25 mg at 02/26/20 0811  . morphine 2 MG/ML injection 2 mg  2 mg Intravenous Q3H PRN Rosezella Rumpf, NP      . ondansetron (ZOFRAN) tablet 4 mg  4 mg Oral Q6H PRN Toy Baker, MD       Or  . ondansetron (ZOFRAN) injection 4 mg  4 mg Intravenous Q6H PRN Doutova, Anastassia, MD      . orphenadrine (NORFLEX) 12 hr tablet 100 mg  100 mg Oral BID Dawley, Troy C, DO      . oxyCODONE (Oxy IR/ROXICODONE) immediate release tablet 5 mg  5 mg Oral Q4H PRN Dawley, Troy C, DO      . oxyCODONE (OXYCONTIN) 12 hr tablet 10 mg  10 mg Oral Q12H Doutova, Anastassia, MD   10 mg at 02/26/20 0956  . polyethylene glycol (MIRALAX / GLYCOLAX) packet 17 g  17 g Oral Daily PRN Doutova, Anastassia, MD      . predniSONE (DELTASONE) tablet 5 mg  5 mg Oral Daily Doutova, Anastassia, MD   5 mg at 02/26/20 0956  . senna (SENOKOT) tablet 8.6 mg  1 tablet Oral BID Toy Baker, MD   8.6 mg at 02/26/20 0956  . simvastatin (ZOCOR) tablet 20 mg  20 mg Oral Daily Doutova, Nyoka Lint, MD   20 mg at 02/26/20 0956  . sodium chloride flush (NS) 0.9 % injection 3 mL  3 mL Intravenous Q12H Toy Baker, MD   3 mL at 02/26/20 0030     Discharge Medications: Please see discharge summary for a list of discharge medications.  Relevant Imaging Results:  Relevant Lab Results:   Additional Information SSN [id number]  Joanne Chars, LCSW

## 2020-02-26 NOTE — Consult Note (Addendum)
Consultation Note Date: 02/26/2020   Patient Name: [name]  DOB: 09/21/1922  MRN: [id number]  Age / Sex: 86 y.o., female  PCP: [name], [place] Referring Physician: Mariel Aloe, MD  Reason for Consultation: Establishing goals of care  HPI/Patient Profile: 86 y.o. female  with past medical history of Anemia, GERD, HTN, and T12 compression fracture s/p kyphoplasty one week ago by Dr. Trenton [name] admitted on 02/25/2020 with severe back pain.   Palliative care was asked to get involved to aid in goals of care conversations. [name] was seen by our service two months ago.   Patient has had a complicated three months with some difficult pain management in the setting of her T12 compression fracture. She had two rehabilitations stays one at [facility] and one at [facility]. She now lives at [facility]. Since hospitalizations she has had decreased mobility and has become wheelchair bound.   Clinical Assessment and Goals of Care:  I have reviewed medical records including EPIC notes, labs and imaging, received report from bedside RN, assessed the patient. [name] was in bed resting, per nursing she was able to fall asleep after IV robaxin was administered.   I had a conference call with [name] (daughter), [name] (son), and [name] (family friend - GNP) to furtherdiscuss diagnosis prognosis, [name], EOL wishes, disposition and options.  I introduced Palliative Medicine as specialized medical care for people living with serious illness. It focuses on providing relief from the symptoms and stress of a serious illness. The goal is to improve quality of life for both the patient and the family.  A brief historical review was completed --> [name] is from [facility] [place] originally. 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 She is a widow and was married to her husband for over 44 years. He died three years ago on hospice.  They shared two daughters and two sons together though she lost one of her daughters and sons.Her daughter, [name] lives locally and her son, [name] is located in [place] though they are well involved in her life. She enjoys knitting and sewing. 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 She vocalizes loving to bake and cooking food for the less fortunate. She is member of a Environmental manager.   Prior to [facility] initial fracture in May she was well functioning performing all bADLs for herself. She was the type of person who got great enjoyment out of everyday and described as an Copywriter, advertising. She played the piano, guitar, and took "parkinsons boxing lessons" at her daughters classes.   A detailed discussion was had today regarding advanced directives, patient children [name] and [name] aid in decision making.  Concepts specific to code status, artifical feeding and hydration, continued IV antibiotics and rehospitalization was had. A MOST had been completed in June with stated  DNAR/DNI which remains to be the case.   The difference between a aggressive medical intervention path and a palliative comfort care path for this patient at this time was had.   Values and goals of care important to patient and family were attempted to be elicited. We discussed the burden of suffering with chronic pain as [name] has done now for so long. [name] shares that she no longer has any quality of life. She is reasonable tearful at this time. I broached to topic of hospice.  I described hospice as a service for patients for have a life expectancy of < 25month. It preserves dignity and quality at the end phases of life. The focus changes from curative to symptom relief. We talked about the transition from  palliative care to hospice care and how our focus remains the same though shifts slightly.   [name] and [name] to hear the insights of Neurosurgery prior to making any additional decisions. We plan to have another meeting after they are able to formally speak to the Neurosurgery team.   Discussed the importance of continued conversation with family and their  medical providers regarding overall plan of care and treatment options, ensuring decisions are within the context of the patients values and GOCs.  Decision Maker: [name](Daughter) [phone]  SUMMARY OF RECOMMENDATIONS   DNAR/DNI  Appreciate Neurosurgery insights on whether or not anything additional can be pursued, if not patients family are open to more of a comfort/Hospice emphasis given the degree of suffering the patient has already endured over the last three months.  Ongoing PMT discussions  Will try to optimize pain control  Code Status/Advance Care Planning:  DNR  Symptom Management:  Severe Back Pain: T12 Pain: Gabapentin 303mPO BID Methocarbomol 50069mV Q6H PRN Oxycontin ER 63m8m BID Oxycodone 5mg 18m PRN Morphine 2mg I30mQ3H PRN - if unresponsive to orals *Of note if adequate pain control is not achieved by tomorrow I would consider placing [name] onMargaritedilaudid PCA with a basal rate to better establish what her 24 hour needs are  Constipation: Colace 1 Tab PO BID Senna 1 Tab PO BID Bisacodyl 63mg P26may PRN  Palliative Prophylaxis:   Aspiration, Bowel Regimen, Delirium Protocol, Eye Care, Frequent Pain Assessment, Oral Care, Palliative Wound Care and Turn Reposition  Additional Recommendations (Limitations, Scope, Preferences):  Treat what is treatable  Psycho-social/Spiritual:   Desire for further Chaplaincy support: Yes  Additional Recommendations: Caregiving  Support/Resources and Education on Hospice  Prognosis:   Unable to determine  Discharge Planning:  To Be Determined     Primary Diagnoses: Present on Admission: . Back pain . Anemia . Compression fracture of T12 vertebra (HCC) . GERD (gastroesophageal reflux disease) . Hyperlipidemia . Hypertension . T12 compression fracture (HCC)  [place] reviewed the medical record, interviewed the patient and family, and examined the patient. The following aspects are pertinent.  Past Medical History:  Diagnosis Date  . Allergy   . Arthritis   . Depression   . Endometriosis   . GERD (gastroesophageal reflux disease)   . Hearing loss 12/08/2011   bil, hearing aids  . Heart murmur   . Hyperlipidemia   . Hypertension   . Osteoporosis   . Ovarian cyst   . Polymyalgia rheumatica (HCC)   [place] herpetic neuralgia   . S/P right THA, AA 12/16/2011  . Shingles 2020  . Skin cancer   . Spinal stenosis  Social History   Socioeconomic History  . Marital status: Widowed    Spouse name: Not on file  . Number of children: 4  . Years of education: Not on file  . Highest education level: Master's degree (e.g., MA, MS, MEng, MEd, MSW, MBA)  Occupational History    Comment: retired  Tobacco Use  . Smoking status: Former Research scientist (life sciences)  . Smokeless tobacco: Never Used  Vaping Use  . Vaping Use: Never used  Substance and Sexual Activity  . Alcohol use: No    Alcohol/week: 1.0 standard drink    Types: 1 Standard drinks or equivalent per week  . Drug use: No  . Sexual activity: Never  Other Topics Concern  . Not on file  Social History Narrative   08/25/18 Lives alone, caregiver, Thayer Headings 4 hours weekdays   Caffeine- tea, occas soda   Social Determinants of Health   Financial Resource Strain:   . Difficulty of Paying Living Expenses:   Food Insecurity:   . Worried About Charity fundraiser in the Last Year:   . Arboriculturist in the Last Year:   Transportation Needs:   . Film/video editor (Medical):   Marland Kitchen Lack of Transportation (Non-Medical):   Physical Activity:   . Days of Exercise  per Week:   . Minutes of Exercise per Session:   Stress:   . Feeling of Stress :   Social Connections:   . Frequency of Communication with Friends and Family:   . Frequency of Social Gatherings with Friends and Family:   . Attends Religious Services:   . Active Member of Clubs or Organizations:   . Attends Archivist Meetings:   Marland Kitchen Marital Status:    Family History  Problem Relation Age of Onset  . Stroke Sister   . Diabetes Daughter   . Cancer Father        esophogeal  . Cancer Paternal Aunt        stomach  . Breast cancer Maternal Grandmother        Age 57  . Stroke Mother    Scheduled Meds: . calcitonin (salmon)  1 spray Alternating Nares Daily  . docusate sodium  100 mg Oral BID  . gabapentin  300 mg Oral BID  . hydrALAZINE  25 mg Oral Q8H  . oxyCODONE  10 mg Oral Q12H  . predniSONE  5 mg Oral Daily  . senna  1 tablet Oral BID  . simvastatin  20 mg Oral Daily  . sodium chloride flush  3 mL Intravenous Q12H   Continuous Infusions: . sodium chloride 75 mL/hr at 02/26/20 0028  . methocarbamol (ROBAXIN) IV 500 mg (02/26/20 0435)   PRN Meds:.acetaminophen **OR** acetaminophen, bisacodyl, methocarbamol (ROBAXIN) IV, ondansetron **OR** ondansetron (ZOFRAN) IV, oxyCODONE, polyethylene glycol Medications Prior to Admission:  Prior to Admission medications   Medication Sig Start Date End Date Taking? Authorizing Provider  acetaminophen (TYLENOL) 325 MG tablet Take 650 mg by mouth 4 (four) times daily. 8am, 12pm, 4pm, 8pm   Yes [provider]  bisacodyl (DULCOLAX) 5 MG EC tablet Take 1 tablet (5 mg total) by mouth daily as needed for moderate constipation. 02/03/16  Yes Hollice Gong, Mir Mohammed, MD  calcitonin, salmon, (MIACALCIN/FORTICAL) 200 UNIT/ACT nasal spray Place 1 spray into alternate nostrils daily at 6 PM. Patient taking differently: Place 1 spray into alternate nostrils daily.  01/06/20  Yes Mercy Riding, MD  Cholecalciferol (VITAMIN D3) 50 MCG  (2000 UT) TABS Take  2,000 Units by mouth daily.   Yes [provider]  docusate sodium (COLACE) 100 MG capsule Take 2 capsules (200 mg total) by mouth 2 (two) times daily. 01/06/20  Yes Mercy Riding, MD  feeding supplement, ENSURE ENLIVE, (ENSURE ENLIVE) LIQD Take 237 mLs by mouth 2 (two) times daily between meals. Give 2 week supply Patient taking differently: Take 237 mLs by mouth 2 (two) times daily.  02/17/16  Yes Thurnell Lose, MD  gabapentin (NEURONTIN) 300 MG capsule Take 300 mg by mouth 2 (two) times daily.   Yes [provider]  hydrALAZINE (APRESOLINE) 25 MG tablet Take 1 tablet (25 mg total) by mouth every 8 (eight) hours. Patient taking differently: Take 25 mg by mouth every 8 (eight) hours. 6am, 2pm, 10pm 12/09/19 02/25/20 Yes Elodia Florence., MD  Lidocaine (ASPERCREME LIDOCAINE) 4 % PTCH Place 1 patch onto the skin See admin instructions. Apply one patch daily - remove after 12 hours (12 hours on, 12 hours off) - for back pain   Yes [provider]  losartan (COZAAR) 50 MG tablet Take 50 mg by mouth daily. 10/15/19  Yes [provider]  meloxicam (MOBIC) 7.5 MG tablet Take 7.5 mg by mouth 2 (two) times daily. 11/03/19  Yes [provider]  mirtazapine (REMERON) 15 MG tablet Take 15 mg by mouth 2 (two) times daily.  10/07/19  Yes [provider]  naloxone (NARCAN) 4 MG/0.1ML LIQD nasal spray kit Place 1 spray into the nose once as needed (opioid overdose).   Yes [provider]  omeprazole (PRILOSEC) 20 MG capsule Take 1 capsule (20 mg total) by mouth daily. 08/04/14  Yes Swords, Darrick Penna, MD  oxyCODONE (OXYCONTIN) 10 mg 12 hr tablet Take 1 tablet (10 mg total) by mouth every 12 (twelve) hours. 01/06/20  Yes Mercy Riding, MD  oxyCODONE (ROXICODONE) 5 MG immediate release tablet Take 1 tablet (5 mg total) by mouth every 6 (six) hours as needed for severe pain. 02/13/20 03/14/20 Yes Trifan, Carola Rhine, MD  polyethylene glycol  powder (MIRALAX) 17 GM/SCOOP powder Take 17 g by mouth 2 (two) times daily as needed for moderate constipation. 01/06/20  Yes Mercy Riding, MD  predniSONE (DELTASONE) 5 MG tablet Take 5 mg by mouth daily. 02/06/20  Yes [provider]  simvastatin (ZOCOR) 20 MG tablet TAKE 1 TABLET EVERY DAY Patient taking differently: Take 20 mg by mouth daily.  09/29/13  Yes Swords, Darrick Penna, MD  acetaminophen (TYLENOL) 500 MG tablet Take 2 tablets (1,000 mg total) by mouth every 8 (eight) hours. Patient not taking: Reported on 02/25/2020 01/06/20   Mercy Riding, MD  DULoxetine (CYMBALTA) 30 MG capsule Take 1 capsule (30 mg total) by mouth daily. Patient not taking: Reported on 02/25/2020 01/06/20   Mercy Riding, MD  enoxaparin (LOVENOX) 40 MG/0.4ML injection Inject 0.4 mLs (40 mg total) into the skin daily for 21 days. Patient not taking: Reported on 02/25/2020 01/06/20 02/25/20  Mercy Riding, MD  gabapentin (NEURONTIN) 300 MG capsule Take 1 capsule (300 mg total) by mouth 2 (two) times daily with breakfast and lunch AND 2 capsules (600 mg total) at bedtime. Patient not taking: Reported on 02/25/2020 01/06/20   Mercy Riding, MD  senna-docusate (SENOKOT-S) 8.6-50 MG tablet Take 1 tablet by mouth 2 (two) times daily between meals as needed for mild constipation. Patient not taking: Reported on 02/25/2020 01/06/20   Mercy Riding, MD   Allergies  Allergen Reactions  . Cartia Xt [Diltiazem] Other (See Comments)    BRADYCARDIA IN 40'S  . Codeine Other (See Comments)    syncope  . Lisinopril-Hydrochlorothiazide Cough  . Mercury Rash  . Sulfa Antibiotics Rash  . Sulfonamide Derivatives Rash    swelling   Vital Signs: BP (!) 180/96 (BP Location: Right Arm) Comment: Nurse Randall Hiss is notified.  [name] 78   Temp 98.7 F (37.1 C) (Oral)   Resp 19   Ht 5' 3"  (1.6 m)   Wt 72.6 kg   SpO2 96%   BMI 28.34 kg/m  Pain Scale: Faces   Pain Score: 10-Worst pain ever  SpO2: SpO2: 96 % O2 Device:SpO2: 96 % O2 Flow Rate:  .O2 Flow Rate (L/min): 2 L/min  IO: Intake/output summary: No intake or output data in the 24 hours ending 02/26/20 0638  LBM:   Baseline Weight: Weight: 72.6 kg Most recent weight: Weight: 72.6 kg     Palliative Assessment/Data: 20%   Time In: 0900 Time Out: 1100 Time Total: 120 Greater than 50%  of this time was spent counseling and coordinating care related to the above assessment and plan.  Signed by: Rosezella Rumpf, NP   Please contact Palliative Medicine Team phone at [phone] for questions and concerns.  For individual provider: See Amion ______________________________________________________________________________________ Addendum: I spoke to Collie Siad at bedside after PT had worked with her. She was quite disoriented and did not understand where she was or why she was here. She asked why her back was hurting. She was redirectable though incrementally moaning.  I was able to speak with Neurosurgery who were able to speak with Arine's family. They have no other surgical options to offer. They recommended changing methocarbomol to Norflex.  I called patients daughter [name] and was able to aid in her speaking to her mother on speaker phone. Yuriko remained disoriented during their conversation as well despite having her hearing aids in place.   Per discussion with [name] she had called [facility] and they could care for Jailani if she were on hospice care. [name] shared with me that her mother living this way is "no way to live." Our goal over the next 24 hours is to achieve better pain control. We plan to speak tomorrow at 1300 to discuss the plan moving forward. I will not be present tomorrow though my colleague - Florentina Jenny, PA will take over her Palliative care needs.   Time In: 1150 Time Out: 1230 Time Total: 40 Greater than 50%  of this time was spent counseling and coordinating care related to the above assessment and plan.  Signed by: Rosezella Rumpf, NP     Please contact Palliative Medicine Team phone at [phone] for questions and concerns.  For individual provider: See Shea Evans

## 2020-02-26 NOTE — Evaluation (Signed)
Occupational Therapy Evaluation Patient Details Name: [name] MRN: [id number] DOB: [birth year] Today's Date: 02/26/2020    History of Present Illness 86 y.o. female admitted on 02/25/20 for back pain.  Pt with recent T12 kyphoplasty vertebroplasty on 02/20/20.  Neurosurgery re-consulted and CT and MRI show stable appearance of fx.  Palliative care has also been consulted.  Pt with significant PMH of R THA, polymyalgia rheumatic, HTN, hearing loss.     Clinical Impression   PTA pt at SNF after recent kyphoplasty and returning due to intractable back pain. Pt presents crying out/yelling prior to therapist even touching patient (suspect anxiety related). Pt is able to be redirected to task with max cueing for about 15 seconds before she begins to cry and yell again. Pt presents with ability to complete bed mobility at mod A +2 and sit <> stands at max A +2 with RW. Pt is not able to achieve full stand, but is able to clear hips for total A peri care. Once pt returned to bed, assisted with self feeding at max A level with hand over hand cues. Pt remains very distracted by pain/anxiety and needs consistent redirection and assurance. Given current status, recommend SNF unless palliative decisions deem otherwise. Spoke with palliative NP at end of session, stating she will need to seek family for decision making. Will continue to follow as appropriate.     Follow Up Recommendations  SNF (pending palliative decisions)    Equipment Recommendations  Wheelchair (measurements OT);Wheelchair cushion (measurements OT);Hospital bed;Other (comment) (hoyer lift and lift pads)    Recommendations for Other Services       Precautions / Restrictions Precautions Precautions: Back;Fall Precaution Comments: pt not cognitively able to follow along with precaution education Required Braces or Orthoses: Other Brace Spinal Brace: Lumbar corset Other Brace: not here this admission.  Restrictions Weight Bearing  Restrictions: No      Mobility Bed Mobility Overal bed mobility: Needs Assistance Bed Mobility: Rolling;Sidelying to Sit;Sit to Sidelying Rolling: Mod assist;+2 for physical assistance Sidelying to sit: Mod assist;+2 for physical assistance     Sit to sidelying: Max assist;+2 for physical assistance General bed mobility comments: Heavy mod to max assist to roll bil, come to sitting from side lying and scoot out to EOB.   Transfers Overall transfer level: Needs assistance Equipment used: Rolling walker (2 wheeled) Transfers: Sit to/from Stand Sit to Stand: Max assist;+2 physical assistance;+2 safety/equipment         General transfer comment: Pt able to stand from elevated HOB with bil hands on RW, knees and feet blocked.  Unable to get to fully upright stand, but does lift bottom off of bed to be able to preform peri care.     Balance Overall balance assessment: Needs assistance Sitting-balance support: Feet supported;Bilateral upper extremity supported Sitting balance-Leahy Scale: Zero Sitting balance - Comments: up to max assist sitting EOB, as good as mod assit. R lateral lean.  Postural control: Right lateral lean Standing balance support: Bilateral upper extremity supported Standing balance-Leahy Scale: Zero Standing balance comment: Poor to zero mod to max assist to stand with two person assist and RW.                            ADL either performed or assessed with clinical judgement   ADL Overall ADL's : Needs assistance/impaired Eating/Feeding: Maximal assistance;Bed level;Cueing for safety;Cueing for sequencing Eating/Feeding Details (indicate cue type and reason): pt very internally  distracted by crying out and needing constant redirection. She states that she is hungry but needs max hand over hand assist and cues to maintain attention to meal Grooming: Maximal assistance;Bed level                       Toileting- Clothing Manipulation and  Hygiene: Total assistance;+2 for physical assistance;+2 for safety/equipment Toileting - Clothing Manipulation Details (indicate cue type and reason): pt continent of bowel and bladder; required total A for hygiene with +2 support to keep steady in standing       General ADL Comments: Pt is otherwise total A for BADLs at this time. She was able to stand at EOB with mod A +2 but only for brief ~10 second period. Needed constant cues and redirection to stay on task     Vision Baseline Vision/History: No visual deficits;Wears glasses Wears Glasses: At all times Patient Visual Report: No change from baseline       Perception     Praxis      Pertinent Vitals/Pain Pain Assessment: Faces Faces Pain Scale: Hurts worst Pain Location: back Pain Descriptors / Indicators: Crying;Grimacing;Guarding Pain Intervention(s): Limited activity within patient's tolerance;Monitored during session;Repositioned     Hand Dominance Right   Extremity/Trunk Assessment Upper Extremity Assessment Upper Extremity Assessment: Generalized weakness   Lower Extremity Assessment Lower Extremity Assessment: Defer to PT evaluation   Cervical / Trunk Assessment Cervical / Trunk Assessment: Other exceptions Cervical / Trunk Exceptions: s/p recent kypohlasty   Communication Communication Communication: HOH   Cognition Arousal/Alertness: Awake/alert Behavior During Therapy: Restless;Anxious Overall Cognitive Status: No family/caregiver present to determine baseline cognitive functioning Area of Impairment: Orientation;Attention;Memory;Following commands;Safety/judgement;Awareness;Problem solving                 Orientation Level: Disoriented to;Place;Time;Situation Current Attention Level: Focused Memory: Decreased recall of precautions;Decreased short-term memory Following Commands: Follows one step commands inconsistently Safety/Judgement: Decreased awareness of safety;Decreased awareness of  deficits Awareness: Intellectual Problem Solving: Slow processing;Requires verbal cues;Requires tactile cues;Difficulty sequencing;Decreased initiation General Comments: pt groaning and yellling out without therapist touching her, seems anxiety related- states she feels scared and does not know why. She is easily redirection, but redirection only last ~10-20 secs before pt begins crying out again. Frequently asks "what happened to me" and "are you my niece" to therapist   General Comments  O2 sats dropped to 82% on RA; 2L [place] re applied with VSS    Exercises     Shoulder Instructions      Home Living Family/patient expects to be discharged to:: [place]: Wheelchair - manual;Hospital bed;Other (comment)          Prior Functioning/Environment Level of Independence: Needs assistance  Gait / Transfers Assistance Needed: unsure of pt's level of assist PTA (difficult reporting due to confusion)   Communication / Swallowing Assistance Needed: HOH per chart          OT Problem List: Decreased strength;Decreased knowledge of use of DME or AE;Decreased knowledge of precautions;Decreased activity tolerance;Decreased cognition;Impaired balance (sitting and/or standing);Decreased safety awareness;Pain      OT Treatment/Interventions: Self-care/ADL training;Therapeutic exercise;Patient/family education;Balance training;Energy conservation;DME and/or AE instruction;Therapeutic activities    OT Goals(Current goals can be found in the care plan section) Acute Rehab OT Goals Patient Stated Goal: unable to state OT  Goal Formulation: Patient unable to participate in goal setting Time For Goal Achievement: 03/11/20 Potential to Achieve Goals: Good  OT Frequency: Min 2X/week   Barriers to D/C:            Co-evaluation PT/OT/SLP Co-Evaluation/Treatment: Yes Reason for Co-Treatment: Complexity of the patient's impairments  (multi-system involvement);For patient/therapist safety;Necessary to address cognition/behavior during functional activity PT goals addressed during session: Mobility/safety with mobility;Balance;Proper use of DME;Strengthening/ROM OT goals addressed during session: ADL's and self-care;Proper use of Adaptive equipment and DME;Strengthening/ROM (cognition)      AM-PAC OT "6 Clicks" Daily Activity     Outcome Measure Help from another person eating meals?: A Lot Help from another person taking care of personal grooming?: A Lot Help from another person toileting, which includes using toliet, bedpan, or urinal?: Total Help from another person bathing (including washing, rinsing, drying)?: Total Help from another person to put on and taking off regular upper body clothing?: Total Help from another person to put on and taking off regular lower body clothing?: Total 6 Click Score: 8   End of Session Equipment Utilized During Treatment: Gait belt;Rolling walker Nurse Communication: Mobility status;Precautions  Activity Tolerance: Patient limited by pain Patient left: in bed;with call bell/phone within reach;with bed alarm set  OT Visit Diagnosis: Unsteadiness on feet (R26.81);Muscle weakness (generalized) (M62.81);Pain;Other symptoms and signs involving cognitive function Pain - part of body:  (back)                Time: 4076-8088 OT Time Calculation (min): 37 min Charges:  OT General Charges $OT Visit: 1 Visit OT Evaluation $OT Eval Moderate Complexity: [place], MSOT, OTR/L [facility] Office Number: [phone] Pager: [phone]  Zenovia Jarred 02/26/2020, 1:04 PM

## 2020-02-26 NOTE — Progress Notes (Signed)
PROGRESS NOTE    [name]  NWG:[id number] DOB: [birth year] DOA: 02/25/2020 PCP: Harrison Mons, PA   Brief Narrative: [name] is a 86 y.o. female with medical history significant of T12 compression fracture, HTN. She presented secondary to back pain in setting of known compression fracture and spinal stenosis/cord compression.   Assessment & Plan:   Active Problems:   Hypertension   Hyperlipidemia   GERD (gastroesophageal reflux disease)   Anemia   Compression fracture of T12 vertebra (HCC)   T12 compression fracture (HCC)   Back pain   Back pain Secondary to below -Continue Oxycontin, morphine prn, oxycodone prn -Neurosurgery recommendations: Norflex  T12 compression fracture T12 spinal canal stenosis/cord compression Unchanged per neurosurgery. No plan for surgical management. -Pain management as mentioned above -PT recommendations: SNF  Anemia Chronic. Stable.  GERD On Prilosec as an outpatient  Hyperlipidemia On simvastatin as an outpatient  Essential hypertension Patient is on hydralazine and losartan as an outpatient. Losartan held on admission -Continue hydralazine and resume home losartan   DVT prophylaxis: SCDs Code Status:   Code Status: DNR Family Communication: None at bedside Disposition Plan: Discharge back to SNF in 1-2 days if able to manage back pain adequately   Consultants:   Neurosurgery  Palliative care medicine  Procedures:   None  Antimicrobials:  None    Subjective: Back pain.  Objective: Vitals:   02/25/20 2146 02/26/20 0347 02/26/20 0838 02/26/20 1429  BP: (!) 175/100 (!) 180/96 (!) 172/94 (!) 143/89  Pulse: 72 76 97 98  Resp: 15 19 18 17   Temp: 98.3 F (36.8 C) 98.7 F (37.1 C) 98.4 F (36.9 C) 98.2 F (36.8 C)  TempSrc: Oral Oral Oral Oral  SpO2: 95% 96% 97% 98%  Weight:      Height:        Intake/Output Summary (Last 24 hours) at 02/26/2020 1611 Last data filed at 02/26/2020 1500 Gross per 24  hour  Intake 240 ml  Output --  Net 240 ml   Filed Weights   02/25/20 0925  Weight: 72.6 kg    Examination:  General exam: Appears calm and comfortable Respiratory system: Transmitted upper airway sounds. Respiratory effort normal. Cardiovascular system: S1 & S2 heard, RRR. 2/6 systolic murmur. Gastrointestinal system: Abdomen is nondistended, soft and nontender. No organomegaly or masses felt. Normal bowel sounds heard. Musculoskeletal: No edema. No calf tenderness Skin: No cyanosis. No rashes    Data Reviewed: I have personally reviewed following labs and imaging studies  CBC Lab Results  Component Value Date   WBC 11.1 (H) 02/26/2020   RBC 3.49 (L) 02/26/2020   HGB 10.2 (L) 02/26/2020   HCT 33.4 (L) 02/26/2020   MCV 95.7 02/26/2020   MCH 29.2 02/26/2020   PLT 295 02/26/2020   MCHC 30.5 02/26/2020   RDW 13.3 02/26/2020   LYMPHSABS 1.2 02/26/2020   MONOABS 1.0 02/26/2020   EOSABS 0.6 (H) 02/26/2020   BASOSABS 0.1 57/84/6962     Last metabolic panel Lab Results  Component Value Date   NA 135 02/26/2020   K 4.0 02/26/2020   CL 95 (L) 02/26/2020   CO2 30 02/26/2020   BUN 17 02/26/2020   CREATININE 0.70 02/26/2020   GLUCOSE 117 (H) 02/26/2020   GFRNONAA >60 02/26/2020   GFRAA >60 02/26/2020   CALCIUM 9.4 02/26/2020   PHOS 3.9 02/26/2020   PROT 6.3 (L) 02/26/2020   ALBUMIN 3.1 (L) 02/26/2020   BILITOT 0.6 02/26/2020   ALKPHOS 93 02/26/2020  AST 16 02/26/2020   ALT 12 02/26/2020   ANIONGAP 10 02/26/2020    CBG (last 3)  No results for input(s): GLUCAP in the last 72 hours.   GFR: Estimated Creatinine Clearance: 40.2 mL/min (by C-G formula based on SCr of 0.7 mg/dL).  Coagulation Profile: No results for input(s): INR, PROTIME in the last 168 hours.  Recent Results (from the past 240 hour(s))  SARS Coronavirus 2 by RT PCR (hospital order, performed in [facility] hospital lab) Nasopharyngeal Nasopharyngeal Swab     Status: None   Collection Time:  02/20/20  9:11 AM   Specimen: Nasopharyngeal Swab  Result Value Ref Range Status   SARS Coronavirus 2 NEGATIVE NEGATIVE Final    Comment: (NOTE) SARS-CoV-2 target nucleic acids are NOT DETECTED.  The SARS-CoV-2 RNA is generally detectable in upper and lower respiratory specimens during the acute phase of infection. The lowest concentration of SARS-CoV-2 viral copies this assay can detect is 250 copies / mL. A negative result does not preclude SARS-CoV-2 infection and should not be used as the sole basis for treatment or other patient management decisions.  A negative result may occur with improper specimen collection / handling, submission of specimen other than nasopharyngeal swab, presence of viral mutation(s) within the areas targeted by this assay, and inadequate number of viral copies (<250 copies / mL). A negative result must be combined with clinical observations, patient history, and epidemiological information.  Fact Sheet for Patients:   StrictlyIdeas.no  Fact Sheet for Healthcare Providers: BankingDealers.co.za  This test is not yet approved or  cleared by the [place] FDA and has been authorized for detection and/or diagnosis of SARS-CoV-2 by FDA under an Emergency Use Authorization (EUA).  This EUA will remain in effect (meaning this test can be used) for the duration of the COVID-19 declaration under Section 564(b)(1) of the Act, 21 U.S.C. section 360bbb-3(b)(1), unless the authorization is terminated or revoked sooner.  Performed at [facility], [place] [address] [place], [place] [address]   Surgical pcr screen     Status: None   Collection Time: 02/20/20 10:02 AM   Specimen: Nasal Mucosa; Nasal Swab  Result Value Ref Range Status   MRSA, PCR NEGATIVE NEGATIVE Final   Staphylococcus aureus NEGATIVE NEGATIVE Final    Comment: (NOTE) The Xpert SA Assay (FDA approved for NASAL specimens in patients  97 years of age and older), is one component of a comprehensive surveillance program. It is not intended to diagnose infection nor to guide or monitor treatment. Performed at [facility], [place] [address], [place], [place] [address]   SARS Coronavirus 2 by RT PCR (hospital order, performed in [facility] hospital lab) Nasopharyngeal Nasopharyngeal Swab     Status: None   Collection Time: 02/25/20 11:45 AM   Specimen: Nasopharyngeal Swab  Result Value Ref Range Status   SARS Coronavirus 2 NEGATIVE NEGATIVE Final    Comment: (NOTE) SARS-CoV-2 target nucleic acids are NOT DETECTED.  The SARS-CoV-2 RNA is generally detectable in upper and lower respiratory specimens during the acute phase of infection. The lowest concentration of SARS-CoV-2 viral copies this assay can detect is 250 copies / mL. A negative result does not preclude SARS-CoV-2 infection and should not be used as the sole basis for treatment or other patient management decisions.  A negative result may occur with improper specimen collection / handling, submission of specimen other than nasopharyngeal swab, presence of viral mutation(s) within the areas targeted by this assay,  and inadequate number of viral copies (<250 copies / mL). A negative result must be combined with clinical observations, patient history, and epidemiological information.  Fact Sheet for Patients:   StrictlyIdeas.no  Fact Sheet for Healthcare Providers: BankingDealers.co.za  This test is not yet approved or  cleared by the [place] FDA and has been authorized for detection and/or diagnosis of SARS-CoV-2 by FDA under an Emergency Use Authorization (EUA).  This EUA will remain in effect (meaning this test can be used) for the duration of the COVID-19 declaration under Section 564(b)(1) of the Act, 21 U.S.C. section 360bbb-3(b)(1), unless the authorization is terminated or revoked  sooner.  Performed at [facility], [address], [place], [place] [address]         Radiology Studies: CT Thoracic Spine Wo Contrast  Result Date: 02/25/2020 CLINICAL DATA:  Spine fracture, thoracic, traumatic. Additional provided: Surgery on Monday for back, pain despite meds. EXAM: CT THORACIC SPINE WITHOUT CONTRAST TECHNIQUE: Multidetector CT images of the thoracic were obtained using the standard protocol without intravenous contrast. COMPARISON:  Thoracic spine MRI 12/31/2019, intra procedural fluoroscopic images of the lower thoracic spine from vertebral augmentation performed 02/20/2020. FINDINGS: Alignment: 7 mm bony retropulsion at the T12 level, which may be slightly progressed as compared to the MRI of 12/31/2019. No significant spondylolisthesis. Vertebrae: Redemonstrated severe T12 compression fracture with 7 mm bony retropulsion. There has been interval vertebral augmentation at this level. Vertebral body height is otherwise maintained. No interval sure is identified. Chronic deformity of the T11 spinous process and lamina which is at least partially congenital. Paraspinal and other soft tissues: Redemonstrated severe T12 Disc levels: Moderate disc space narrowing throughout the thoracic spine. At T6-T7, there is a somewhat prominent calcified central disc herniate which contributes to mild spinal canal stenosis and resultant mild spinal cord mass effect on the prior MRI of 12/31/2019. At the T12 level, bony retropulsion contributes to severe spinal canal stenosis with likely persistent mass effect upon the conus medullaris. No appreciable significant spinal canal stenosis at the remaining levels. IMPRESSION: Redemonstrated severe T12 vertebral compression fracture. There has been interval vertebral augmentation since the thoracic spine MRI of 12/31/2019. There is 7 mm bony retropulsion at this level which may be slightly progressed. Severe spinal canal stenosis at this level  with likely persistent impingement of the conus medullaris. Thoracic spine MRI may be obtained to assess for spinal cord signal abnormality, as clinically warranted. No evidence of interval thoracic spine fracture. Thoracic spondylosis as described. Most notably at T6-T7, a somewhat prominent central calcified disc herniation contributes to mild spinal canal stenosis and there was mild spinal spinal cord mass effect at this site on the recent prior MRI. Electronically Signed   By: Kellie Simmering DO   On: 02/25/2020 10:58   CT Lumbar Spine Wo Contrast  Result Date: 02/25/2020 CLINICAL DATA:  Lumbar radiculopathy, trauma. EXAM: CT LUMBAR SPINE WITHOUT CONTRAST TECHNIQUE: Multidetector CT imaging of the lumbar spine was performed without intravenous contrast administration. Multiplanar CT image reconstructions were also generated. COMPARISON:  Lumbar spine MRI 12/31/2019, CT of the lumbar spine 12/31/2019 FINDINGS: Segmentation: Lumbar dextrocurvature.  5 lumbar vertebrae. Alignment: Trace L1-L2, L4-L5 and L5-S1 grade 1 anterolisthesis. Vertebrae: Please refer to concurrently performed thoracic spine CT for description of findings at the T12 level. Lumbar vertebral body height is maintained. Redemonstrated subacute bilateral L5 transverse process fractures (series 3, image 88). No evidence of interval fracture to the lumbar spine. Paraspinal and other soft  tissues: Aortoiliac atherosclerosis. Atrophy of the lumbar paraspinal musculature. Disc levels: Multilevel disc space narrowing and disc vacuum phenomenon. Disc space narrowing is greatest at L1-L2, L2-L3 and L4-L5 (moderate in severity at these levels). Multilevel disc bulges and endplate spurring as well as multilevel facet arthrosis and ligamentum flavum hypertrophy. No more than mild appreciable spinal canal stenosis at any level. Unchanged multilevel neural foraminal narrowing. Multilevel neural foraminal narrowing greatest bilaterally at L4-L5 (moderately  severe at these sites). IMPRESSION: Please refer to the concurrently performed thoracic spine CT for a description of findings at the T12 level. Redemonstrated subacute bilateral L5 transverse process fractures. No evidence of interval fracture to the lumbar spine. Lumbar spondylosis as described. No more than mild appreciable spinal canal stenosis at any level. Multilevel neural foraminal narrowing. Most notably there is moderate/severe bilateral neural foraminal narrowing at L4-L5. Lumbar dextrocurvature. Trace multilevel spondylolisthesis as described. Aortic Atherosclerosis (ICD10-I70.0). Electronically Signed   By: Kellie Simmering DO   On: 02/25/2020 11:13   MR THORACIC SPINE WO CONTRAST  Result Date: 02/25/2020 CLINICAL DATA:  Posttraumatic spinal fracture. T12 compression fracture identified on Nov 24, 2019. Multiple follow-up studies. Subsequent kyphoplasty T12 on 02/20/2020 EXAM: MRI THORACIC SPINE WITHOUT CONTRAST TECHNIQUE: Multiplanar, multisequence MR imaging of the thoracic spine was performed. No intravenous contrast was administered. COMPARISON:  CT thoracic spine 02/25/2020. MRI thoracic spine 12/31/2019 FINDINGS: Alignment:  2 mm anterolisthesis C7-T1.  Remaining alignment normal. Vertebrae: Severe compression fracture of T12 with vertebral augmentation. Cement in satisfactory position. Left-sided approach. There is 7 mm of retropulsion of T12 vertebral body into the canal causing severe spinal stenosis. This is similar to the prior MRI of 12/31/2019 No other thoracic fracture identified. Negative for metastatic disease. Cord: Severe spinal stenosis and cord compression at T12 unchanged from prior MRI of 12/31/2019. No cord signal abnormality. Large calcified disc protrusion on the right T6-7 with significant flattening of the cord ventrally and on the right side unchanged from the prior MRI. No cord signal abnormality. Paraspinal and other soft tissues: Negative for paraspinous mass edema or fluid  collection. Disc levels: C7-T1: 2 mm anterolisthesis with facet degeneration. This is causing moderate foraminal stenosis bilaterally. T6-7: Large calcified disc protrusion to the right of midline with extensive cord flattening. There is sufficient CSF posterior to the cord at this level. No other levels of disc protrusion or stenosis in the thoracic spine. IMPRESSION: Severe compression fracture T12 vertebral augmentation unchanged from prior studies. 7 mm retropulsion of T12 into the spinal canal causing severe spinal stenosis and cord compression, unchanged from the prior MRI. No cord signal abnormality. Large calcified disc protrusion on the right at T6-7 with cord flattening, unchanged from prior studies. Electronically Signed   By: Franchot Gallo M.D.   On: 02/25/2020 16:11   ECHOCARDIOGRAM COMPLETE  Result Date: 02/26/2020    ECHOCARDIOGRAM REPORT   Patient Name:   HAILA DENA Date of Exam: 02/26/2020 Medical Rec #:  [id number]    Height:       63.0 in Accession #:    [id number]   Weight:       160.0 lb Date of Birth:  1923/05/30     BSA:          1.759 m Patient Age:    89 years     BP:           172/94 mmHg Patient Gender: F            HR:  77 bpm. Exam Location:   Procedure: 2D Echo Indications:    Murmur 785.2 / R01.1  History:        Patient has no prior history of Echocardiogram examinations.                 Arrythmias:Bradycardia; Risk Factors:Hypertension and                 Dyslipidemia.  Sonographer:    Luane School Referring Phys: [place]  1. Left ventricular ejection fraction, by estimation, is 70 to 75%. The left ventricle has hyperdynamic function. The left ventricle has no regional wall motion abnormalities. There is moderate concentric left ventricular hypertrophy. Left ventricular diastolic parameters are consistent with Grade I diastolic dysfunction (impaired relaxation). Elevated left ventricular end-diastolic pressure.  2. Right ventricular  systolic function is normal. The right ventricular size is normal. There is moderately elevated pulmonary artery systolic pressure. The estimated right ventricular systolic pressure is 09.3 mmHg.  3. Left atrial size was mildly dilated.  4. The mitral valve is normal in structure. Mild mitral valve regurgitation. No evidence of mitral stenosis.  5. The aortic valve has an indeterminant number of cusps. Aortic valve regurgitation is trivial. Moderate aortic valve stenosis. Aortic valve area, by VTI measures 1.36 cm. Aortic valve Vmax measures 3.22 m/s.  6. Aortic dilatation noted. There is moderate dilatation of the ascending aorta measuring 44 mm.  7. The inferior vena cava is normal in size with greater than 50% respiratory variability, suggesting right atrial pressure of 3 mmHg. FINDINGS  Left Ventricle: Left ventricular ejection fraction, by estimation, is 70 to 75%. The left ventricle has hyperdynamic function. The left ventricle has no regional wall motion abnormalities. The left ventricular internal cavity size was normal in size. There is moderate concentric left ventricular hypertrophy. Left ventricular diastolic parameters are consistent with Grade I diastolic dysfunction (impaired relaxation). Elevated left ventricular end-diastolic pressure. Right Ventricle: The right ventricular size is normal. No increase in right ventricular wall thickness. Right ventricular systolic function is normal. There is moderately elevated pulmonary artery systolic pressure. The tricuspid regurgitant velocity is 3.90 m/s, and with an assumed right atrial pressure of 3 mmHg, the estimated right ventricular systolic pressure is 26.7 mmHg. Left Atrium: Left atrial size was mildly dilated. Right Atrium: Right atrial size was normal in size. Pericardium: There is no evidence of pericardial effusion. Mitral Valve: The mitral valve is normal in structure. Normal mobility of the mitral valve leaflets. Severe mitral annular  calcification. Mild mitral valve regurgitation. No evidence of mitral valve stenosis. Tricuspid Valve: The tricuspid valve is normal in structure. Tricuspid valve regurgitation is mild . No evidence of tricuspid stenosis. Aortic Valve: The aortic valve has an indeterminant number of cusps. . There is severe thickening and severe calcifcation of the aortic valve. Aortic valve regurgitation is trivial. Moderate aortic stenosis is present. There is severe thickening of the aortic valve. There is severe calcifcation of the aortic valve. Aortic valve mean gradient measures 28.0 mmHg. Aortic valve peak gradient measures 41.4 mmHg. Aortic valve area, by VTI measures 1.36 cm. Pulmonic Valve: The pulmonic valve was normal in structure. Pulmonic valve regurgitation is not visualized. No evidence of pulmonic stenosis. Aorta: Aortic dilatation noted. There is moderate dilatation of the ascending aorta measuring 44 mm. Venous: The inferior vena cava is normal in size with greater than 50% respiratory variability, suggesting right atrial pressure of 3 mmHg. IAS/Shunts: No atrial level shunt detected by color flow Doppler.  LEFT VENTRICLE PLAX 2D LVIDd:         5.20 cm  Diastology LVIDs:         2.85 cm  LV e' lateral:   5.55 cm/s LV PW:         1.40 cm  LV E/e' lateral: 17.7 LV IVS:        1.40 cm  LV e' medial:    3.48 cm/s LVOT diam:     2.00 cm  LV E/e' medial:  28.2 LV SV:         86 LV SV Index:   49 LVOT Area:     3.14 cm  RIGHT VENTRICLE             IVC RV S prime:     17.00 cm/s  IVC diam: 1.60 cm TAPSE (M-mode): 3.1 cm LEFT ATRIUM             Index LA diam:        2.70 cm 1.54 cm/m LA Vol (A2C):   54.8 ml 31.16 ml/m LA Vol (A4C):   86.3 ml 49.07 ml/m LA Biplane Vol: 69.6 ml 39.57 ml/m  AORTIC VALVE AV Area (Vmax):    1.27 cm AV Area (Vmean):   1.18 cm AV Area (VTI):     1.36 cm AV Vmax:           321.67 cm/s AV Vmean:          254.000 cm/s AV VTI:            0.631 m AV Peak Grad:      41.4 mmHg AV Mean Grad:       28.0 mmHg LVOT Vmax:         130.00 cm/s LVOT Vmean:        95.500 cm/s LVOT VTI:          0.274 m LVOT/AV VTI ratio: 0.43  AORTA Ao Root diam: 3.20 cm Ao Asc diam:  4.45 cm MITRAL VALVE                TRICUSPID VALVE MV Area (PHT): 2.30 cm     TR Peak grad:   60.8 mmHg MV Decel Time: 330 msec     TR Vmax:        390.00 cm/s MV E velocity: 98.30 cm/s MV A velocity: 154.00 cm/s  SHUNTS MV E/A ratio:  0.64         Systemic VTI:  0.27 m                             Systemic Diam: 2.00 cm Fransico Him MD Electronically signed by Fransico Him MD Signature Date/Time: 02/26/2020/12:53:16 PM    Final    DG Hip Unilat W or Wo Pelvis 2-3 Views Right  Result Date: 02/25/2020 CLINICAL DATA:  Right hip pain. Recent T12 back surgery. No reported injury. EXAM: DG HIP (WITH OR WITHOUT PELVIS) 2-3V RIGHT COMPARISON:  09/28/2017 hip radiographs FINDINGS: Right total hip arthroplasty with no evidence of hardware fracture or loosening. No right hip dislocation. No acute osseous fracture. No pelvic diastasis. Mild left hip osteoarthritis. Degenerative changes in the visualized lower lumbar spine. No suspicious focal osseous lesions. IMPRESSION: Right total hip arthroplasty, with no evidence of hardware complication. No acute osseous abnormality. No right hip dislocation. Electronically Signed   By: Ilona Sorrel M.D.   On: 02/25/2020 11:20  Scheduled Meds: . calcitonin (salmon)  1 spray Alternating Nares Daily  . docusate sodium  100 mg Oral BID  . gabapentin  300 mg Oral BID  . hydrALAZINE  25 mg Oral Q8H  . orphenadrine  100 mg Oral BID  . oxyCODONE  10 mg Oral Q12H  . predniSONE  5 mg Oral Daily  . senna  1 tablet Oral BID  . simvastatin  20 mg Oral Daily  . sodium chloride flush  3 mL Intravenous Q12H   Continuous Infusions:   LOS: 0 days     Cordelia Poche, MD [facility] 02/26/2020, 4:11 PM  If 7PM-7AM, please contact night-coverage [email or web address]

## 2020-02-26 NOTE — Consult Note (Signed)
   Providing Compassionate, Quality Care - Together  Neurosurgery Consult  Referring physician: Dr. Sedonia Small Reason for referral: LBP s/p kyphoplasty  Chief Complaint: LBP  History of Present Illness: This is a 86 year old female with a recent history of a T12 kyphoplasty by Dr. [name] on 02/20/2020.  She returned to the hospital complaining of severe low back pain.  Her pain is radiating across her low back in both directions.  She denies any numbness tingling or new weakness in her legs.  She denies any bowel or bladder changes.  She originally went to [facility] which they could not get her pain under control therefore she was transferred here for imaging.  CT and MRI was performed which showed stable retropulsion of T12 with moderate compression, stable compared to prior.   Medications: I have reviewed the patient's current medications. Allergies: No Known Allergies  History reviewed. No pertinent family history. Social History:  has no history on file for tobacco use, alcohol use, and drug use.    Physical Exam:  Vital signs in last 24 hours: Temp:  [98 F (36.7 C)-98.3 F (36.8 C)] 98 F (36.7 C) (07/25 1814) Pulse Rate:  [58-128] 65 (07/26 0746) Resp:  [11-18] 14 (07/26 0217) BP: (138-182)/(65-125) 153/88 (07/26 0700) SpO2:  [91 %-98 %] 96 % (07/26 0746) PE: Awake alert oriented Intermittently screams in pain Pupils equally round reactive light EOMI Face symmetric Incision over the T12 region is well-healed Moving all extremities well, bilateral lower extremities able to lift off the bed, she does have some pain limited challenges as this causes pain in her back. Sensory to light touch intact throughout   Impression/Assessment:  86 year old female with 1.  T12 compression fracture with retropulsion -s/p Kyphoplasty 02/20/2020 2.  Acute on chronic low back pain  Plan:  -Imaging reviewed, her fracture is stable on CAT scan and MRI compared to previous.  I do  not see any evidence of new fractures. -I do not recommend any acute surgical intervention -I reviewed her medication and up to her oxycodone dose frequency as well as added Norflex IV for spasms.  Hopefully this will give her some relief. -Continue pain management, and therapy once pain controlled   Thank you for allowing me to participate in this patient's care.  Please do not hesitate to call with questions or concerns.   Elwin Sleight, [facility] Cell: [phone]

## 2020-02-26 NOTE — TOC Initial Note (Signed)
Transition of Care [facility]) - Initial/Assessment Note    Patient Details  Name: [name] MRN: [id number] Date of Birth: 03-Oct-1922  Transition of Care [facility]) CM/SW Contact:    [name] Chars, LCSW Phone Number: 02/26/2020, 3:12 PM  Clinical Narrative:     CSW attempted to speak with pt regarding [facility] and discharge plan.  Pt unable to participate. CSW spoke with daughter [name] over the phone.  Pt is residant at [facility] and daughter in agreement that she would return to the SNF side there.  Pt is vaccinated.  Daughter spoke with palliative team earlier today and family is considering palliative vs hospice case as well.                Expected Discharge Plan: Skilled Nursing Facility Barriers to Discharge: Continued Medical Work up, Ship broker   Patient Goals and CMS Choice Patient states their goals for this hospitalization and ongoing recovery are:: address pain issues CMS Medicare.gov Compare Post Acute Care list provided to:: Patient Represenative (must comment) (daughter, [name]) Choice offered to / list presented to : Patient  Expected Discharge Plan and Services Expected Discharge Plan: [place] Choice: [place] Living arrangements for the past 2 months: [place]                                      Prior Living Arrangements/Services Living arrangements for the past 2 months: [place] Lives with:: Facility Resident Patient language and need for interpreter reviewed:: Yes Do you feel safe going back to the place where you live?: Yes      Need for Family Participation in Patient Care: Yes (Comment) Care giver support system in place?: Yes (comment)   Criminal Activity/Legal Involvement Pertinent to Current Situation/Hospitalization: No - Comment as needed  Activities of Daily Living Home Assistive Devices/Equipment: Eyeglasses, Cane (specify  quad or straight), Hearing aid, Walker (specify type), Wheelchair ADL Screening (condition at time of admission) Patient's cognitive ability adequate to safely complete daily activities?: Yes Is the patient deaf or have difficulty hearing?: Yes Does the patient have difficulty seeing, even when wearing glasses/contacts?: No Does the patient have difficulty concentrating, remembering, or making decisions?: No Patient able to express need for assistance with ADLs?: Yes Does the patient have difficulty dressing or bathing?: No Independently performs ADLs?: Yes (appropriate for developmental age) Does the patient have difficulty walking or climbing stairs?: Yes Weakness of Legs: Both Weakness of Arms/Hands: None  Permission Sought/Granted                  Emotional Assessment Appearance:: Appears stated age Attitude/Demeanor/Rapport: Unable to Assess Affect (typically observed): Unable to Assess Orientation: : Oriented to Self, Oriented to Place Alcohol / Substance Use: Not Applicable Psych Involvement: No (comment)  Admission diagnosis:  Back pain [M54.9] Compression fracture of T12 vertebra, initial encounter [facility]) [S22.080A] Patient Active Problem List   Diagnosis Date Noted  . Back pain 02/25/2020  . T12 compression fracture ([place]) 01/01/2020  . Intractable pain 12/31/2019  . Low back pain 12/31/2019  . Palliative care by specialist   . Goals of care, counseling/discussion   . DNR (do not resuscitate)   . Compression fracture of T12 vertebra ([place]) 12/04/2019  . Spinal fracture of T12 vertebra ([place]) 12/04/2019  . Fever, unspecified 02/15/2016  . Anxiety 02/15/2016  .  Anemia 02/15/2016  . Hyponatremia 02/15/2016  . Bradycardia   . Constipation   . Depression   . MDD (major depressive disorder), recurrent severe, without psychosis ([place])   . Symptomatic bradycardia 01/30/2016  . Symptomatic anemia 01/29/2016  . Hypertension   . Osteoporosis   . Hyperlipidemia   .  GERD (gastroesophageal reflux disease)   . Spinal stenosis   . Hearing loss 12/08/2011  . Osteoarthritis of hip 12/02/2011  . Endometriosis   . DIVERTICULOSIS-COLON 02/12/2009  . ALLERGIC RHINITIS 01/19/2007  . ESOPHAGEAL STRICTURE 01/19/2007   PCP:  [name] Mons, PA Pharmacy:  No Pharmacies Listed    Social Determinants of Health (SDOH) Interventions    Readmission Risk Interventions Readmission Risk Prevention Plan 01/03/2020  Post Dischage Appt Complete  Medication Screening Complete  Transportation Screening Complete  Some recent data might be hidden

## 2020-02-26 NOTE — Progress Notes (Signed)
Complete echocardiogram performed.  Jimmy Gracemarie Skeet RDCS, RVT  

## 2020-02-27 DIAGNOSIS — G8929 Other chronic pain: Secondary | ICD-10-CM | POA: Diagnosis present

## 2020-02-27 DIAGNOSIS — M546 Pain in thoracic spine: Secondary | ICD-10-CM | POA: Diagnosis not present

## 2020-02-27 DIAGNOSIS — I10 Essential (primary) hypertension: Secondary | ICD-10-CM | POA: Diagnosis present

## 2020-02-27 DIAGNOSIS — Z515 Encounter for palliative care: Secondary | ICD-10-CM

## 2020-02-27 DIAGNOSIS — R52 Pain, unspecified: Secondary | ICD-10-CM | POA: Diagnosis not present

## 2020-02-27 DIAGNOSIS — Z79899 Other long term (current) drug therapy: Secondary | ICD-10-CM | POA: Diagnosis not present

## 2020-02-27 DIAGNOSIS — S22080G Wedge compression fracture of T11-T12 vertebra, subsequent encounter for fracture with delayed healing: Secondary | ICD-10-CM | POA: Diagnosis not present

## 2020-02-27 DIAGNOSIS — S22080A Wedge compression fracture of T11-T12 vertebra, initial encounter for closed fracture: Secondary | ICD-10-CM | POA: Diagnosis not present

## 2020-02-27 DIAGNOSIS — K5900 Constipation, unspecified: Secondary | ICD-10-CM | POA: Diagnosis present

## 2020-02-27 DIAGNOSIS — M549 Dorsalgia, unspecified: Secondary | ICD-10-CM | POA: Diagnosis present

## 2020-02-27 DIAGNOSIS — D649 Anemia, unspecified: Secondary | ICD-10-CM | POA: Diagnosis present

## 2020-02-27 DIAGNOSIS — M4804 Spinal stenosis, thoracic region: Secondary | ICD-10-CM | POA: Diagnosis present

## 2020-02-27 DIAGNOSIS — M47814 Spondylosis without myelopathy or radiculopathy, thoracic region: Secondary | ICD-10-CM | POA: Diagnosis present

## 2020-02-27 DIAGNOSIS — K219 Gastro-esophageal reflux disease without esophagitis: Secondary | ICD-10-CM | POA: Diagnosis present

## 2020-02-27 DIAGNOSIS — Z7189 Other specified counseling: Secondary | ICD-10-CM

## 2020-02-27 DIAGNOSIS — Z87891 Personal history of nicotine dependence: Secondary | ICD-10-CM | POA: Diagnosis not present

## 2020-02-27 DIAGNOSIS — M4854XD Collapsed vertebra, not elsewhere classified, thoracic region, subsequent encounter for fracture with routine healing: Secondary | ICD-10-CM | POA: Diagnosis present

## 2020-02-27 DIAGNOSIS — Z20822 Contact with and (suspected) exposure to covid-19: Secondary | ICD-10-CM | POA: Diagnosis present

## 2020-02-27 DIAGNOSIS — Z7401 Bed confinement status: Secondary | ICD-10-CM | POA: Diagnosis not present

## 2020-02-27 DIAGNOSIS — Z66 Do not resuscitate: Secondary | ICD-10-CM | POA: Diagnosis present

## 2020-02-27 DIAGNOSIS — R410 Disorientation, unspecified: Secondary | ICD-10-CM | POA: Diagnosis present

## 2020-02-27 DIAGNOSIS — W19XXXD Unspecified fall, subsequent encounter: Secondary | ICD-10-CM | POA: Diagnosis present

## 2020-02-27 DIAGNOSIS — Z96641 Presence of right artificial hip joint: Secondary | ICD-10-CM | POA: Diagnosis present

## 2020-02-27 DIAGNOSIS — Z823 Family history of stroke: Secondary | ICD-10-CM | POA: Diagnosis not present

## 2020-02-27 DIAGNOSIS — R5381 Other malaise: Secondary | ICD-10-CM | POA: Diagnosis present

## 2020-02-27 DIAGNOSIS — F419 Anxiety disorder, unspecified: Secondary | ICD-10-CM | POA: Diagnosis present

## 2020-02-27 DIAGNOSIS — F329 Major depressive disorder, single episode, unspecified: Secondary | ICD-10-CM | POA: Diagnosis present

## 2020-02-27 DIAGNOSIS — E785 Hyperlipidemia, unspecified: Secondary | ICD-10-CM | POA: Diagnosis present

## 2020-02-27 DIAGNOSIS — Z833 Family history of diabetes mellitus: Secondary | ICD-10-CM | POA: Diagnosis not present

## 2020-02-27 MED ORDER — HYDROMORPHONE HCL 1 MG/ML IJ SOLN: 1.0000 mg | Freq: Once | INTRAMUSCULAR | Status: DC

## 2020-02-27 MED ORDER — LIDOCAINE 5 % EX PTCH
1.0000 | MEDICATED_PATCH | CUTANEOUS | Status: DC
  Administered 2020-02-27 – 2020-03-01 (×4): 1 via TRANSDERMAL
  Filled 2020-02-27 (×4): qty 1

## 2020-02-27 MED ORDER — CELECOXIB 100 MG PO CAPS
100.0000 mg | ORAL_CAPSULE | Freq: Every day | ORAL | Status: DC
  Administered 2020-02-27 – 2020-03-01 (×4): 100 mg via ORAL
  Filled 2020-02-27 (×5): qty 1

## 2020-02-27 MED ORDER — CHLORHEXIDINE GLUCONATE CLOTH 2 % EX PADS
6.0000 | MEDICATED_PAD | Freq: Every day | CUTANEOUS | Status: DC
  Administered 2020-02-27 – 2020-03-01 (×4): 6 via TOPICAL

## 2020-02-27 MED ORDER — ACETAMINOPHEN 500 MG PO TABS
1000.0000 mg | ORAL_TABLET | Freq: Three times a day (TID) | ORAL | Status: DC
  Administered 2020-02-27 – 2020-03-01 (×11): 1000 mg via ORAL
  Filled 2020-02-27 (×12): qty 2

## 2020-02-27 MED ORDER — HYDROMORPHONE HCL 1 MG/ML IJ SOLN
0.2500 mg | INTRAMUSCULAR | Status: DC
  Administered 2020-02-27 – 2020-02-29 (×13): 0.25 mg via INTRAVENOUS
  Filled 2020-02-27 (×14): qty 1

## 2020-02-27 MED ORDER — HYDROMORPHONE HCL 1 MG/ML IJ SOLN: 0.5000 mg | INTRAMUSCULAR | Status: DC | PRN

## 2020-02-27 MED ORDER — GABAPENTIN 300 MG PO CAPS
600.0000 mg | ORAL_CAPSULE | Freq: Every day | ORAL | Status: DC
  Administered 2020-02-27 – 2020-03-01 (×4): 600 mg via ORAL
  Filled 2020-02-27 (×5): qty 2

## 2020-02-27 NOTE — Progress Notes (Signed)
PROGRESS NOTE    [name]  BWI:[id number] DOB: [birth year] DOA: 02/25/2020 PCP: Harrison Mons, PA   Brief Narrative: [name] is a 86 y.o. female with medical history significant of T12 compression fracture, HTN. She presented secondary to back pain in setting of known compression fracture and spinal stenosis/cord compression.   Assessment & Plan:   Active Problems:   Hypertension   Hyperlipidemia   GERD (gastroesophageal reflux disease)   Anemia   Compression fracture of T12 vertebra (HCC)   T12 compression fracture (HCC)   Back pain   Back pain Secondary to below. Does not appear to be any better or any worse. -Continue Oxycontin, morphine prn, oxycodone prn -Continue gabapentin -Neurosurgery recommendations: Norflex  T12 compression fracture T12 spinal canal stenosis/cord compression Unchanged per neurosurgery. No plan for surgical management. -Pain management as mentioned above -PT recommendations: SNF  Anemia Chronic. Stable.  GERD On Prilosec as an outpatient  Hyperlipidemia On simvastatin as an outpatient  Essential hypertension Patient is on hydralazine and losartan as an outpatient. Losartan held on admission -Continue hydralazine and resume home losartan   DVT prophylaxis: SCDs Code Status:   Code Status: DNR Family Communication: None at bedside. Called daughter on telephone with no answer. Disposition Plan: Discharge back to SNF in 1-2 days if able to manage back pain adequately   Consultants:   Neurosurgery  Palliative care medicine  Procedures:   None  Antimicrobials:  None    Subjective: In pain all over. Cannot qualify pain. She is focusing on her legs and that they hurt  Objective: Vitals:   02/26/20 0838 02/26/20 1429 02/26/20 1938 02/27/20 0332  BP: (!) 172/94 (!) 143/89 (!) 138/98 (!) 144/66  Pulse: 97 98 77 64  Resp: 18 17 18 18   Temp: 98.4 F (36.9 C) 98.2 F (36.8 C) 99.2 F (37.3 C) 99.6 F (37.6 C)    TempSrc: Oral Oral Oral Oral  SpO2: 97% 98% 97% 97%  Weight:      Height:        Intake/Output Summary (Last 24 hours) at 02/27/2020 1331 Last data filed at 02/27/2020 0100 Gross per 24 hour  Intake 240 ml  Output 1100 ml  Net -860 ml   Filed Weights   02/25/20 0925  Weight: 72.6 kg    Examination:  General exam: Appears calm and comfortable Respiratory system: Clear to auscultation. Respiratory effort normal. Cardiovascular system: S1 & S2 heard, RRR. No murmurs, rubs, gallops or clicks. Gastrointestinal system: Abdomen is nondistended, soft and nontender. No organomegaly or masses felt. Normal bowel sounds heard. Central nervous system: Alert and oriented to person, place and year. No focal neurological deficits. Musculoskeletal: No edema. No calf tenderness. Pain on even light palpation of her legs Skin: No cyanosis. No rashes Psychiatry: Judgement and insight appear normal. Mood & affect appropriate.     Data Reviewed: I have personally reviewed following labs and imaging studies  CBC Lab Results  Component Value Date   WBC 11.1 (H) 02/26/2020   RBC 3.49 (L) 02/26/2020   HGB 10.2 (L) 02/26/2020   HCT 33.4 (L) 02/26/2020   MCV 95.7 02/26/2020   MCH 29.2 02/26/2020   PLT 295 02/26/2020   MCHC 30.5 02/26/2020   RDW 13.3 02/26/2020   LYMPHSABS 1.2 02/26/2020   MONOABS 1.0 02/26/2020   EOSABS 0.6 (H) 02/26/2020   BASOSABS 0.1 63/84/5364     Last metabolic panel Lab Results  Component Value Date   NA 135 02/26/2020  K 4.0 02/26/2020   CL 95 (L) 02/26/2020   CO2 30 02/26/2020   BUN 17 02/26/2020   CREATININE 0.70 02/26/2020   GLUCOSE 117 (H) 02/26/2020   GFRNONAA >60 02/26/2020   GFRAA >60 02/26/2020   CALCIUM 9.4 02/26/2020   PHOS 3.9 02/26/2020   PROT 6.3 (L) 02/26/2020   ALBUMIN 3.1 (L) 02/26/2020   BILITOT 0.6 02/26/2020   ALKPHOS 93 02/26/2020   AST 16 02/26/2020   ALT 12 02/26/2020   ANIONGAP 10 02/26/2020    CBG (last 3)  No results for  input(s): GLUCAP in the last 72 hours.   GFR: Estimated Creatinine Clearance: 40.2 mL/min (by C-G formula based on SCr of 0.7 mg/dL).  Coagulation Profile: No results for input(s): INR, PROTIME in the last 168 hours.  Recent Results (from the past 240 hour(s))  SARS Coronavirus 2 by RT PCR (hospital order, performed in [facility] hospital lab) Nasopharyngeal Nasopharyngeal Swab     Status: None   Collection Time: 02/20/20  9:11 AM   Specimen: Nasopharyngeal Swab  Result Value Ref Range Status   SARS Coronavirus 2 NEGATIVE NEGATIVE Final    Comment: (NOTE) SARS-CoV-2 target nucleic acids are NOT DETECTED.  The SARS-CoV-2 RNA is generally detectable in upper and lower respiratory specimens during the acute phase of infection. The lowest concentration of SARS-CoV-2 viral copies this assay can detect is 250 copies / mL. A negative result does not preclude SARS-CoV-2 infection and should not be used as the sole basis for treatment or other patient management decisions.  A negative result may occur with improper specimen collection / handling, submission of specimen other than nasopharyngeal swab, presence of viral mutation(s) within the areas targeted by this assay, and inadequate number of viral copies (<250 copies / mL). A negative result must be combined with clinical observations, patient history, and epidemiological information.  Fact Sheet for Patients:   StrictlyIdeas.no  Fact Sheet for Healthcare Providers: BankingDealers.co.za  This test is not yet approved or  cleared by the [place] FDA and has been authorized for detection and/or diagnosis of SARS-CoV-2 by FDA under an Emergency Use Authorization (EUA).  This EUA will remain in effect (meaning this test can be used) for the duration of the COVID-19 declaration under Section 564(b)(1) of the Act, 21 U.S.C. section 360bbb-3(b)(1), unless the authorization is terminated  or revoked sooner.  Performed at [facility], [place] [address], [place], [place] [address]   Surgical pcr screen     Status: None   Collection Time: 02/20/20 10:02 AM   Specimen: Nasal Mucosa; Nasal Swab  Result Value Ref Range Status   MRSA, PCR NEGATIVE NEGATIVE Final   Staphylococcus aureus NEGATIVE NEGATIVE Final    Comment: (NOTE) The Xpert SA Assay (FDA approved for NASAL specimens in patients 19 years of age and older), is one component of a comprehensive surveillance program. It is not intended to diagnose infection nor to guide or monitor treatment. Performed at [facility], [place] [address] [place], [place] [address]   SARS Coronavirus 2 by RT PCR (hospital order, performed in [facility] hospital lab) Nasopharyngeal Nasopharyngeal Swab     Status: None   Collection Time: 02/25/20 11:45 AM   Specimen: Nasopharyngeal Swab  Result Value Ref Range Status   SARS Coronavirus 2 NEGATIVE NEGATIVE Final    Comment: (NOTE) SARS-CoV-2 target nucleic acids are NOT DETECTED.  The SARS-CoV-2 RNA is generally detectable in upper and lower respiratory specimens during the  acute phase of infection. The lowest concentration of SARS-CoV-2 viral copies this assay can detect is 250 copies / mL. A negative result does not preclude SARS-CoV-2 infection and should not be used as the sole basis for treatment or other patient management decisions.  A negative result may occur with improper specimen collection / handling, submission of specimen other than nasopharyngeal swab, presence of viral mutation(s) within the areas targeted by this assay, and inadequate number of viral copies (<250 copies / mL). A negative result must be combined with clinical observations, patient history, and epidemiological information.  Fact Sheet for Patients:   StrictlyIdeas.no  Fact Sheet for Healthcare Providers: BankingDealers.co.za  This test is  not yet approved or  cleared by the [place] FDA and has been authorized for detection and/or diagnosis of SARS-CoV-2 by FDA under an Emergency Use Authorization (EUA).  This EUA will remain in effect (meaning this test can be used) for the duration of the COVID-19 declaration under Section 564(b)(1) of the Act, 21 U.S.C. section 360bbb-3(b)(1), unless the authorization is terminated or revoked sooner.  Performed at [facility], [address] [place], [place] [address]         Radiology Studies: MR THORACIC SPINE WO CONTRAST  Result Date: 02/25/2020 CLINICAL DATA:  Posttraumatic spinal fracture. T12 compression fracture identified on Nov 24, 2019. Multiple follow-up studies. Subsequent kyphoplasty T12 on 02/20/2020 EXAM: MRI THORACIC SPINE WITHOUT CONTRAST TECHNIQUE: Multiplanar, multisequence MR imaging of the thoracic spine was performed. No intravenous contrast was administered. COMPARISON:  CT thoracic spine 02/25/2020. MRI thoracic spine 12/31/2019 FINDINGS: Alignment:  2 mm anterolisthesis C7-T1.  Remaining alignment normal. Vertebrae: Severe compression fracture of T12 with vertebral augmentation. Cement in satisfactory position. Left-sided approach. There is 7 mm of retropulsion of T12 vertebral body into the canal causing severe spinal stenosis. This is similar to the prior MRI of 12/31/2019 No other thoracic fracture identified. Negative for metastatic disease. Cord: Severe spinal stenosis and cord compression at T12 unchanged from prior MRI of 12/31/2019. No cord signal abnormality. Large calcified disc protrusion on the right T6-7 with significant flattening of the cord ventrally and on the right side unchanged from the prior MRI. No cord signal abnormality. Paraspinal and other soft tissues: Negative for paraspinous mass edema or fluid collection. Disc levels: C7-T1: 2 mm anterolisthesis with facet degeneration. This is causing moderate foraminal stenosis bilaterally.  T6-7: Large calcified disc protrusion to the right of midline with extensive cord flattening. There is sufficient CSF posterior to the cord at this level. No other levels of disc protrusion or stenosis in the thoracic spine. IMPRESSION: Severe compression fracture T12 vertebral augmentation unchanged from prior studies. 7 mm retropulsion of T12 into the spinal canal causing severe spinal stenosis and cord compression, unchanged from the prior MRI. No cord signal abnormality. Large calcified disc protrusion on the right at T6-7 with cord flattening, unchanged from prior studies. Electronically Signed   By: Franchot Gallo M.D.   On: 02/25/2020 16:11   ECHOCARDIOGRAM COMPLETE  Result Date: 02/26/2020    ECHOCARDIOGRAM REPORT   Patient Name:   DEMESHIA SHERBURNE Date of Exam: 02/26/2020 Medical Rec #:  [id number]    Height:       63.0 in Accession #:    [id number]   Weight:       160.0 lb Date of Birth:  March 23, 1923     BSA:          1.759 m Patient Age:  89 years     BP:           172/94 mmHg Patient Gender: F            HR:           77 bpm. Exam Location:  [place] Procedure: 2D Echo Indications:    Murmur 785.2 / R01.1  History:        Patient has no prior history of Echocardiogram examinations.                 Arrythmias:Bradycardia; Risk Factors:Hypertension and                 Dyslipidemia.  Sonographer:    Luane School Referring Phys: [place]  1. Left ventricular ejection fraction, by estimation, is 70 to 75%. The left ventricle has hyperdynamic function. The left ventricle has no regional wall motion abnormalities. There is moderate concentric left ventricular hypertrophy. Left ventricular diastolic parameters are consistent with Grade I diastolic dysfunction (impaired relaxation). Elevated left ventricular end-diastolic pressure.  2. Right ventricular systolic function is normal. The right ventricular size is normal. There is moderately elevated pulmonary artery systolic pressure. The  estimated right ventricular systolic pressure is 47.4 mmHg.  3. Left atrial size was mildly dilated.  4. The mitral valve is normal in structure. Mild mitral valve regurgitation. No evidence of mitral stenosis.  5. The aortic valve has an indeterminant number of cusps. Aortic valve regurgitation is trivial. Moderate aortic valve stenosis. Aortic valve area, by VTI measures 1.36 cm. Aortic valve Vmax measures 3.22 m/s.  6. Aortic dilatation noted. There is moderate dilatation of the ascending aorta measuring 44 mm.  7. The inferior vena cava is normal in size with greater than 50% respiratory variability, suggesting right atrial pressure of 3 mmHg. FINDINGS  Left Ventricle: Left ventricular ejection fraction, by estimation, is 70 to 75%. The left ventricle has hyperdynamic function. The left ventricle has no regional wall motion abnormalities. The left ventricular internal cavity size was normal in size. There is moderate concentric left ventricular hypertrophy. Left ventricular diastolic parameters are consistent with Grade I diastolic dysfunction (impaired relaxation). Elevated left ventricular end-diastolic pressure. Right Ventricle: The right ventricular size is normal. No increase in right ventricular wall thickness. Right ventricular systolic function is normal. There is moderately elevated pulmonary artery systolic pressure. The tricuspid regurgitant velocity is 3.90 m/s, and with an assumed right atrial pressure of 3 mmHg, the estimated right ventricular systolic pressure is 25.9 mmHg. Left Atrium: Left atrial size was mildly dilated. Right Atrium: Right atrial size was normal in size. Pericardium: There is no evidence of pericardial effusion. Mitral Valve: The mitral valve is normal in structure. Normal mobility of the mitral valve leaflets. Severe mitral annular calcification. Mild mitral valve regurgitation. No evidence of mitral valve stenosis. Tricuspid Valve: The tricuspid valve is normal in structure.  Tricuspid valve regurgitation is mild . No evidence of tricuspid stenosis. Aortic Valve: The aortic valve has an indeterminant number of cusps. . There is severe thickening and severe calcifcation of the aortic valve. Aortic valve regurgitation is trivial. Moderate aortic stenosis is present. There is severe thickening of the aortic valve. There is severe calcifcation of the aortic valve. Aortic valve mean gradient measures 28.0 mmHg. Aortic valve peak gradient measures 41.4 mmHg. Aortic valve area, by VTI measures 1.36 cm. Pulmonic Valve: The pulmonic valve was normal in structure. Pulmonic valve regurgitation is not visualized. No evidence of pulmonic stenosis. Aorta: Aortic dilatation  noted. There is moderate dilatation of the ascending aorta measuring 44 mm. Venous: The inferior vena cava is normal in size with greater than 50% respiratory variability, suggesting right atrial pressure of 3 mmHg. IAS/Shunts: No atrial level shunt detected by color flow Doppler.  LEFT VENTRICLE PLAX 2D LVIDd:         5.20 cm  Diastology LVIDs:         2.85 cm  LV e' lateral:   5.55 cm/s LV PW:         1.40 cm  LV E/e' lateral: 17.7 LV IVS:        1.40 cm  LV e' medial:    3.48 cm/s LVOT diam:     2.00 cm  LV E/e' medial:  28.2 LV SV:         86 LV SV Index:   49 LVOT Area:     3.14 cm  RIGHT VENTRICLE             IVC RV S prime:     17.00 cm/s  IVC diam: 1.60 cm TAPSE (M-mode): 3.1 cm LEFT ATRIUM             Index LA diam:        2.70 cm 1.54 cm/m LA Vol (A2C):   54.8 ml 31.16 ml/m LA Vol (A4C):   86.3 ml 49.07 ml/m LA Biplane Vol: 69.6 ml 39.57 ml/m  AORTIC VALVE AV Area (Vmax):    1.27 cm AV Area (Vmean):   1.18 cm AV Area (VTI):     1.36 cm AV Vmax:           321.67 cm/s AV Vmean:          254.000 cm/s AV VTI:            0.631 m AV Peak Grad:      41.4 mmHg AV Mean Grad:      28.0 mmHg LVOT Vmax:         130.00 cm/s LVOT Vmean:        95.500 cm/s LVOT VTI:          0.274 m LVOT/AV VTI ratio: 0.43  AORTA Ao Root diam:  3.20 cm Ao Asc diam:  4.45 cm MITRAL VALVE                TRICUSPID VALVE MV Area (PHT): 2.30 cm     TR Peak grad:   60.8 mmHg MV Decel Time: 330 msec     TR Vmax:        390.00 cm/s MV E velocity: 98.30 cm/s MV A velocity: 154.00 cm/s  SHUNTS MV E/A ratio:  0.64         Systemic VTI:  0.27 m                             Systemic Diam: 2.00 cm Fransico Him MD Electronically signed by Fransico Him MD Signature Date/Time: 02/26/2020/12:53:16 PM    Final         Scheduled Meds: . acetaminophen  1,000 mg Oral TID  . calcitonin (salmon)  1 spray Alternating Nares Daily  . celecoxib  100 mg Oral Daily  . docusate sodium  100 mg Oral BID  . gabapentin  600 mg Oral QHS  . hydrALAZINE  25 mg Oral Q8H  .  HYDROmorphone (DILAUDID) injection  0.25 mg Intravenous Q4H  . lidocaine  1 patch Transdermal Q24H  .  losartan  50 mg Oral Daily  . senna  1 tablet Oral BID  . simvastatin  20 mg Oral Daily  . sodium chloride flush  3 mL Intravenous Q12H   Continuous Infusions:   LOS: 0 days     Cordelia Poche, MD [facility] 02/27/2020, 1:31 PM  If 7PM-7AM, please contact night-coverage [email or web address]

## 2020-02-27 NOTE — Plan of Care (Signed)
  Problem: Education: Goal: Knowledge of General Education information will improve Description Including pain rating scale, medication(s)/side effects and non-pharmacologic comfort measures Outcome: Progressing   Problem: Health Behavior/Discharge Planning: Goal: Ability to manage health-related needs will improve Outcome: Progressing   

## 2020-02-27 NOTE — Plan of Care (Signed)

## 2020-02-27 NOTE — Progress Notes (Signed)
Daily Progress Note   Patient Name: [name]       Date: 02/27/2020 DOB: [birth year]  Age: 86 y.o. MRN#: [id number] Attending Physician: Mariel Aloe, MD Primary Care Physician: [name] Mons, PA Admit Date: 02/25/2020  Reason for Consultation/Follow-up: To discuss complex medical decision making related to patient's goals of care  When I approach the patient's bedside she begins to scream as if in severe pain.   I attempt to comfort her and she asks me "What's wrong?"  And I reply everything is good.  She asks "why?"  She cries please help me and screams again.  I ask if she's thirsty and she replies yes and takes a drink of the water I offered.  She then states "I don't trust you.  Why don't you trust me?".  She has received gabapentin, prednisone 5 mg, oxy IR 5 mg as of 11:30 this morning.  Subjective: I called Betsy to touch base and confirm our meeting at 1:00 today.  We face-timed [name] and her mother.  She was able to ascertain that her mother's pain is across her back and down her legs.   Conference call with held including [name] (daughter), Dorothyann Peng (son), Bethann Punches (close family friend and geriatric nurse practitioner), Parks Neptune ([place]), and myself.    We discussed [name] current condition as a combination of severe pain and delirium.  It was brought to light that she has been suffering with the same combination of symptoms for the past month.  Her symptoms were previously much worse when she was on oxycodone and OxyContin.  When these medications were removed her delirium improved.    It is felt that her severe symptoms including delirium are the result of both physical and emotional pain.  We overviewed the new symptom management regimen  being initiated today which includes starting Celebrex, scheduled Tylenol, Lidoderm patch, scheduled and as needed Dilaudid.  Prednisone has been discontinued.  OxyContin and oxycodone have been discontinued.  Today is a day of transition and symptom management.  Hopefully by Wednesday we will see improved results.  We discussed initiation of hospice services at [facility] at the time of discharge.  All parties on the phone seemed very much in support of this idea.  Services that hospice provides were discussed.  The family is familiar with these services as they involved hospice successfully in their father's care at his end-of-life.  It was also decided that the director of hospice would be be "hospice doctor "after discharge.  In hopes that this would make an necessary changes to medications faster and easier to accomplish.  Finally we decided to have a follow up call on Wednesday at 1:00 EST using a bridge number I provided.  We did discuss that if [name] symptoms can not be significantly improved in the next 3 days to 1 week time frame, then we may be in a different place and we may need to discuss transition to a Sarita at that point.   Of note, [name] shared that Dr. Trenton Gammon of Neurosurgery did offer a surgical procedure.  The family and support team on the phone agreed that [name] would be better served by avoiding surgery.   Assessment: 89 yof with T12 retropulsion and compression fracture.  Refractory pain and acute delirium that is likely a combination of both physical and emotional pain and grief.   Patient Profile/HPI:  86 y.o. female  with past medical history of Anemia, GERD, HTN, and T12 compression fracture s/p kyphoplasty one week ago by Dr. Trenton Gammon admitted on 02/25/2020 with severe back pain.   Palliative care was asked to get involved to aid in goals of care conversations. [name] was seen by our service two months ago.   Patient has had a complicated three months  with some difficult pain management in the setting of her T12 compression fracture. She had two rehabilitations stays one at [facility] and one at [facility]. She now lives at [facility]. Since hospitalizations she has had decreased mobility and has become wheelchair bound.     Length of Stay: 0   Vital Signs: BP (!) 144/66 (BP Location: Right Arm)   Pulse 64   Temp 99.6 F (37.6 C) (Oral)   Resp 18   Ht 5\' 3"  (1.6 m)   Wt 72.6 kg   SpO2 97%   BMI 28.34 kg/m  SpO2: SpO2: 97 % O2 Device: O2 Device: Nasal Cannula O2 Flow Rate: O2 Flow Rate (L/min): 2 L/min       Palliative Assessment/Data: 20%    Palliative Care Plan    Recommendations/Plan:  Discussed symptom management at length with Dr. Hilma Favors:  Will maximize non-opioid treatments - add celebrex, add lidoderm patch, add scheduled tylenol.  Agree with calcitonin.  Will d/c oxycodone / oxycontin - concerned that is worsening acute delirium.    Will rotate opioid to very low dose dilaudid scheduled q 4 hours with a q 2 hour PRN.  Will closely watch how many PRNs are used and patient response in order to determine appropriate dosage.  Will adjust gabapentin dosage to be given in 1 dose qhs in order to help sleep/wake cycle.  Optimize symptom management as efficiently and effectively as possible.  We do not want her to stay in the hospital any longer than necessary, but her symptoms must be significantly improved in order to discharge to [facility].    When symptoms improve discharge to [facility] with [facility] support.   If symptoms remain refractory may need to recommend Hospice facility for 24 hour symptom management.  Follow up call scheduled for Wednesday 8/11 at 1:00 pm on the PMT bridge.  Code Status:  DNR  Prognosis:   < 6 months secondary to rapid functional decline (now bedbound), refractory  symptoms of severe pain and delirium,  Multiple hospitalizations and rehab stays in a  very short period of time.  No good interventional options.  Can only use medication management at this point.   Discharge Planning:  To Be Determined  Care plan was discussed with Dr. Hilma Favors, bedside RN, Family, Wadie Lessen, Palliative NP and Lead APP.  Thank you for allowing the Palliative Medicine Team to assist in the care of this patient.  Total time spent:  6  Min.     Greater than 50%  of this time was spent counseling and coordinating care related to the above assessment and plan.  Florentina Jenny, PA-C Palliative Medicine  Please contact Palliative MedicineTeam phone at [phone] for questions and concerns between 7 am - 7 pm.   Please see AMION for individual provider pager numbers.

## 2020-02-28 ENCOUNTER — Ambulatory Visit: Payer: Medicare HMO | Admitting: Psychology

## 2020-02-28 DIAGNOSIS — G8929 Other chronic pain: Secondary | ICD-10-CM | POA: Diagnosis not present

## 2020-02-28 DIAGNOSIS — F419 Anxiety disorder, unspecified: Secondary | ICD-10-CM

## 2020-02-28 DIAGNOSIS — I10 Essential (primary) hypertension: Secondary | ICD-10-CM | POA: Diagnosis not present

## 2020-02-28 DIAGNOSIS — R5381 Other malaise: Secondary | ICD-10-CM

## 2020-02-28 DIAGNOSIS — M546 Pain in thoracic spine: Secondary | ICD-10-CM | POA: Diagnosis not present

## 2020-02-28 DIAGNOSIS — Z515 Encounter for palliative care: Secondary | ICD-10-CM | POA: Diagnosis not present

## 2020-02-28 DIAGNOSIS — S22080G Wedge compression fracture of T11-T12 vertebra, subsequent encounter for fracture with delayed healing: Secondary | ICD-10-CM | POA: Diagnosis not present

## 2020-02-28 NOTE — Progress Notes (Signed)
This chaplain responded to PMT consult for spiritual care.  The chaplain attempted spiritual care visit with the Pt. The Pt. met the chaplain's voice with the announcement of pain. The chaplain updated the Pt. RN-Roj. The RN updated PMT NP-ML and the chaplain on the events of the Pt. day. The chaplain was present as NP-ML left a VM for Pt. daughter-Betsy and Care Provider-Melanie Adler.  The chaplain understands NP-ML will communicate goals of care as they develop.  The chaplain will personally F/U with spiritual care or connect the Pt. to the [facility] chaplain.

## 2020-02-28 NOTE — Progress Notes (Signed)
PROGRESS NOTE    ZIYAN HILLMER  YQM:[id number] DOB: August 01, 1922 DOA: 02/25/2020 PCP: Harrison Mons, PA   Brief Narrative: [name] is a 86 y.o. female with medical history significant of T12 compression fracture, HTN. She presented secondary to back pain in setting of known compression fracture and spinal stenosis/cord compression. Pain management.   Assessment & Plan:   Active Problems:   Hypertension   Hyperlipidemia   GERD (gastroesophageal reflux disease)   Anemia   Compression fracture of T12 vertebra (HCC)   T12 compression fracture (HCC)   Back pain   Pain management   Encounter for hospice care discussion   Palliative care encounter   Back pain Secondary to below. Does not appear to be any better or any worse. -Neurosurgery recommendations: Norflex (completed course) -Palliative care medicine for pain control: Celebrex, calcitonin, gabapentin, dilaudid scheduled/prn  T12 compression fracture T12 spinal canal stenosis/cord compression Unchanged per neurosurgery. No plan for surgical management. -Pain management as mentioned above -PT recommendations: SNF  Anemia Chronic. Stable.  GERD On Prilosec as an outpatient  Hyperlipidemia On simvastatin as an outpatient  Essential hypertension Patient is on hydralazine and losartan as an outpatient. Losartan held on admission -Continue hydralazine and losartan   DVT prophylaxis: SCDs Code Status:   Code Status: DNR Family Communication: None at bedside. Called daughter on telephone with no answer (8/10). Disposition Plan: Discharge back to SNF in 1-2 days if able to manage back pain adequately   Consultants:   Neurosurgery  Palliative care medicine  Procedures:   None  Antimicrobials:  None    Subjective: Patient is in pain. Says she just wants to die (seemingly secondary to extreme uncontrolled pain).   Objective: Vitals:   02/27/20 1813 02/27/20 1929 02/28/20 0414 02/28/20 0827  BP: (!)  142/75 133/72 133/72 (!) 149/77  Pulse: 70 68 60 72  Resp: 18 18 15 20   Temp: 99.5 F (37.5 C) 98.8 F (37.1 C) (!) 97.4 F (36.3 C) 97.9 F (36.6 C)  TempSrc: Oral Oral Oral Oral  SpO2: 97% 96% 98% 96%  Weight:      Height:        Intake/Output Summary (Last 24 hours) at 02/28/2020 0917 Last data filed at 02/28/2020 0858 Gross per 24 hour  Intake 3 ml  Output 1200 ml  Net -1197 ml   Filed Weights   02/25/20 0925  Weight: 72.6 kg    Examination:  General exam: Appears calm and appears uncomfortable and in pain Respiratory system: Clear to auscultation. Respiratory effort normal. Cardiovascular system: S1 & S2 heard, RRR. 2/6 systolic murmur. Gastrointestinal system: Abdomen is nondistended, soft and nontender. No organomegaly or masses felt. Normal bowel sounds heard. Central nervous system: Alert and oriented. No focal neurological deficits. Musculoskeletal: No edema. No calf tenderness Skin: No cyanosis. No rashes Psychiatry: Judgement and insight appear normal.       Data Reviewed: I have personally reviewed following labs and imaging studies  CBC Lab Results  Component Value Date   WBC 11.1 (H) 02/26/2020   RBC 3.49 (L) 02/26/2020   HGB 10.2 (L) 02/26/2020   HCT 33.4 (L) 02/26/2020   MCV 95.7 02/26/2020   MCH 29.2 02/26/2020   PLT 295 02/26/2020   MCHC 30.5 02/26/2020   RDW 13.3 02/26/2020   LYMPHSABS 1.2 02/26/2020   MONOABS 1.0 02/26/2020   EOSABS 0.6 (H) 02/26/2020   BASOSABS 0.1 88/91/6945     Last metabolic panel Lab Results  Component Value Date  NA 135 02/26/2020   K 4.0 02/26/2020   CL 95 (L) 02/26/2020   CO2 30 02/26/2020   BUN 17 02/26/2020   CREATININE 0.70 02/26/2020   GLUCOSE 117 (H) 02/26/2020   GFRNONAA >60 02/26/2020   GFRAA >60 02/26/2020   CALCIUM 9.4 02/26/2020   PHOS 3.9 02/26/2020   PROT 6.3 (L) 02/26/2020   ALBUMIN 3.1 (L) 02/26/2020   BILITOT 0.6 02/26/2020   ALKPHOS 93 02/26/2020   AST 16 02/26/2020   ALT 12  02/26/2020   ANIONGAP 10 02/26/2020    CBG (last 3)  No results for input(s): GLUCAP in the last 72 hours.   GFR: Estimated Creatinine Clearance: 40.2 mL/min (by C-G formula based on SCr of 0.7 mg/dL).  Coagulation Profile: No results for input(s): INR, PROTIME in the last 168 hours.  Recent Results (from the past 240 hour(s))  SARS Coronavirus 2 by RT PCR (hospital order, performed in [facility] hospital lab) Nasopharyngeal Nasopharyngeal Swab     Status: None   Collection Time: 02/20/20  9:11 AM   Specimen: Nasopharyngeal Swab  Result Value Ref Range Status   SARS Coronavirus 2 NEGATIVE NEGATIVE Final    Comment: (NOTE) SARS-CoV-2 target nucleic acids are NOT DETECTED.  The SARS-CoV-2 RNA is generally detectable in upper and lower respiratory specimens during the acute phase of infection. The lowest concentration of SARS-CoV-2 viral copies this assay can detect is 250 copies / mL. A negative result does not preclude SARS-CoV-2 infection and should not be used as the sole basis for treatment or other patient management decisions.  A negative result may occur with improper specimen collection / handling, submission of specimen other than nasopharyngeal swab, presence of viral mutation(s) within the areas targeted by this assay, and inadequate number of viral copies (<250 copies / mL). A negative result must be combined with clinical observations, patient history, and epidemiological information.  Fact Sheet for Patients:   StrictlyIdeas.no  Fact Sheet for Healthcare Providers: BankingDealers.co.za  This test is not yet approved or  cleared by the [place] FDA and has been authorized for detection and/or diagnosis of SARS-CoV-2 by FDA under an Emergency Use Authorization (EUA).  This EUA will remain in effect (meaning this test can be used) for the duration of the COVID-19 declaration under Section 564(b)(1) of the Act, 21  U.S.C. section 360bbb-3(b)(1), unless the authorization is terminated or revoked sooner.  Performed at [facility], [place] [address], [place], [place] [address]   Surgical pcr screen     Status: None   Collection Time: 02/20/20 10:02 AM   Specimen: Nasal Mucosa; Nasal Swab  Result Value Ref Range Status   MRSA, PCR NEGATIVE NEGATIVE Final   Staphylococcus aureus NEGATIVE NEGATIVE Final    Comment: (NOTE) The Xpert SA Assay (FDA approved for NASAL specimens in patients 57 years of age and older), is one component of a comprehensive surveillance program. It is not intended to diagnose infection nor to guide or monitor treatment. Performed at [facility], [place] [address], [place], [place] [address]   SARS Coronavirus 2 by RT PCR (hospital order, performed in [facility] hospital lab) Nasopharyngeal Nasopharyngeal Swab     Status: None   Collection Time: 02/25/20 11:45 AM   Specimen: Nasopharyngeal Swab  Result Value Ref Range Status   SARS Coronavirus 2 NEGATIVE NEGATIVE Final    Comment: (NOTE) SARS-CoV-2 target nucleic acids are NOT DETECTED.  The SARS-CoV-2 RNA is generally detectable in upper and  lower respiratory specimens during the acute phase of infection. The lowest concentration of SARS-CoV-2 viral copies this assay can detect is 250 copies / mL. A negative result does not preclude SARS-CoV-2 infection and should not be used as the sole basis for treatment or other patient management decisions.  A negative result may occur with improper specimen collection / handling, submission of specimen other than nasopharyngeal swab, presence of viral mutation(s) within the areas targeted by this assay, and inadequate number of viral copies (<250 copies / mL). A negative result must be combined with clinical observations, patient history, and epidemiological information.  Fact Sheet for Patients:   StrictlyIdeas.no  Fact Sheet for Healthcare  Providers: BankingDealers.co.za  This test is not yet approved or  cleared by the [place] FDA and has been authorized for detection and/or diagnosis of SARS-CoV-2 by FDA under an Emergency Use Authorization (EUA).  This EUA will remain in effect (meaning this test can be used) for the duration of the COVID-19 declaration under Section 564(b)(1) of the Act, 21 U.S.C. section 360bbb-3(b)(1), unless the authorization is terminated or revoked sooner.  Performed at [facility], [address] [place], [place] [address]         Radiology Studies: No results found.      Scheduled Meds: . acetaminophen  1,000 mg Oral TID  . calcitonin (salmon)  1 spray Alternating Nares Daily  . celecoxib  100 mg Oral Daily  . Chlorhexidine Gluconate Cloth  6 each Topical Daily  . docusate sodium  100 mg Oral BID  . gabapentin  600 mg Oral QHS  . hydrALAZINE  25 mg Oral Q8H  .  HYDROmorphone (DILAUDID) injection  0.25 mg Intravenous Q4H  . lidocaine  1 patch Transdermal Q24H  . losartan  50 mg Oral Daily  . senna  1 tablet Oral BID  . simvastatin  20 mg Oral Daily  . sodium chloride flush  3 mL Intravenous Q12H   Continuous Infusions:   LOS: 1 day     Cordelia Poche, MD [facility] 02/28/2020, 9:17 AM  If 7PM-7AM, please contact night-coverage [email or web address]

## 2020-02-28 NOTE — Plan of Care (Signed)

## 2020-02-28 NOTE — Progress Notes (Signed)
Patient ID: MIKAYELA DEATS, female   DOB: 01-28-1923, 86 y.o.   MRN: [id number]  This NP reviewed medical records, received report from team and then  assessed the patient at the bedside as a follow up for palliative medicine needs and emotional support.  Patient was initially seen by palliative medicine team/ Tacey Ruiz NP  on February 26, 2020 for assistance with establishing goals of care.  Tate Jerkins is a 86 year old female with a recent history of a T12 kyphoplasty by Dr. Trenton Gammon on 02/20/2020.  PMH significant for anemia, GERD, hypertension.  Complicated pain.  I spoke with patient's daughter Shamiyah Ngu by telephone and she describes a complex 66-month medical history consisting of all , kyphoplasty and 2 rehabilitation  admissions [facility], continued debility with continued physical and functional decline and ongoing chronic pain.  According to family and manager at [facility] uncontrolled pain and associated behaviors of "yelling out" have remained a constant for Sheketa Ende as far back as her rehabilitation admissions in spite of multiple attempts at various pain management strategies.  According to daughter various discussions have been had exploring various tenets of patient's pain; delirium, existential components, fear of death and dying, and possibly "attention seeking "behavior".(per daughter)  Patient has significant physiologic changes that does  explain significant pain.  MRI on 02/25/2020 significant for severe compression fracture T12 vertebral augmentation unchanged from prior studies.  7 mm retropulsion of T12 and the spinal canal causing severe spinal stenosis and cord compression, unchanged from prior MRI Large calcified disc protrusion on the right at T6-7 with cord flattening, unchanged from prior studies.  Prior to actually may patient's daughter reports that the patient was independent for all activities of daily living, she was active in temple, she  quilted, was in book clubs and hosted [employer].  Over the past 3 months patient has become dependent for all ADLs.    Education offered on the utilization of medication for serious chronic pain situations.  Education offered regarding  risks, benefits and side effects of pharmaceuticals.   Discussed with daughter the importance of continued conversation with family regarding goals of care.  Daughter verbalizes today that she hopes for lucidity for her mother for her final days, encouraged [name] to focus on the patient's goals of care as it relates to comfort, quality and dignity as a person recognizes faces and life trajectory.  Discussed with daughter the importance of continued conversation with her mother and other family members and the  medical providers regarding overall plan of care and treatment options,  ensuring decisions are within the context of the patients values and GOCs.  Plan is to meet with daughter at patient's bedside tomorrow morning at 8 AM, and then there is a scheduled conference call tomorrow afternoon at 1 PM.  Questions and concerns addressed   Discussed with Dr Lonny Prude  Total time spent on the unit was 60 minutes  Greater than 50% of the time was spent in counseling and coordination of care  Wadie Lessen NP  Palliative Medicine Team Team Phone # [phone] Pager [phone]

## 2020-02-28 NOTE — Progress Notes (Signed)
This chaplain understands from conversation with PMT-ML; the Pt. is Jewish and the family will contact Lamonte Richer for spiritual care visit.

## 2020-02-29 ENCOUNTER — Ambulatory Visit: Payer: Medicare HMO | Admitting: Psychology

## 2020-02-29 DIAGNOSIS — Z515 Encounter for palliative care: Secondary | ICD-10-CM | POA: Diagnosis not present

## 2020-02-29 DIAGNOSIS — I10 Essential (primary) hypertension: Secondary | ICD-10-CM | POA: Diagnosis not present

## 2020-02-29 DIAGNOSIS — M546 Pain in thoracic spine: Secondary | ICD-10-CM | POA: Diagnosis not present

## 2020-02-29 DIAGNOSIS — R52 Pain, unspecified: Secondary | ICD-10-CM | POA: Diagnosis not present

## 2020-02-29 DIAGNOSIS — R5381 Other malaise: Secondary | ICD-10-CM

## 2020-02-29 DIAGNOSIS — S22080A Wedge compression fracture of T11-T12 vertebra, initial encounter for closed fracture: Secondary | ICD-10-CM | POA: Diagnosis not present

## 2020-02-29 LAB — CBC
HCT: 29.4 % — ABNORMAL LOW (ref 36.0–46.0)
Hemoglobin: 9.3 g/dL — ABNORMAL LOW (ref 12.0–15.0)
MCH: 30.1 pg (ref 26.0–34.0)
MCHC: 31.6 g/dL (ref 30.0–36.0)
MCV: 95.1 fL (ref 80.0–100.0)
Platelets: 283 10*3/uL (ref 150–400)
RBC: 3.09 MIL/uL — ABNORMAL LOW (ref 3.87–5.11)
RDW: 12.6 % (ref 11.5–15.5)
WBC: 9.7 10*3/uL (ref 4.0–10.5)
nRBC: 0 % (ref 0.0–0.2)

## 2020-02-29 LAB — BASIC METABOLIC PANEL
Anion gap: 11 (ref 5–15)
BUN: 23 mg/dL (ref 8–23)
CO2: 28 mmol/L (ref 22–32)
Calcium: 9.1 mg/dL (ref 8.9–10.3)
Chloride: 93 mmol/L — ABNORMAL LOW (ref 98–111)
Creatinine, Ser: 0.73 mg/dL (ref 0.44–1.00)
GFR calc Af Amer: >60 mL/min (ref >60)
GFR calc non Af Amer: >60 mL/min (ref >60)
Glucose, Bld: 141 mg/dL — ABNORMAL HIGH (ref 70–99)
Potassium: 3.7 mmol/L (ref 3.5–5.1)
Sodium: 132 mmol/L — ABNORMAL LOW (ref 135–145)

## 2020-02-29 MED ORDER — MIRTAZAPINE 15 MG PO TABS
15.0000 mg | ORAL_TABLET | Freq: Every day | ORAL | Status: DC
  Administered 2020-02-29 – 2020-03-01 (×2): 15 mg via ORAL
  Filled 2020-02-29 (×2): qty 1

## 2020-02-29 MED ORDER — FENTANYL 12 MCG/HR TD PT72
1.0000 | MEDICATED_PATCH | TRANSDERMAL | Status: DC
  Administered 2020-02-29: 1 via TRANSDERMAL
  Filled 2020-02-29: qty 1

## 2020-02-29 MED ORDER — ESCITALOPRAM OXALATE 10 MG PO TABS
5.0000 mg | ORAL_TABLET | Freq: Two times a day (BID) | ORAL | Status: DC
  Administered 2020-02-29 – 2020-03-01 (×4): 5 mg via ORAL
  Filled 2020-02-29 (×5): qty 1

## 2020-02-29 MED ORDER — HYDROMORPHONE HCL 1 MG/ML IJ SOLN
0.5000 mg | Freq: Four times a day (QID) | INTRAMUSCULAR | Status: DC | PRN
  Administered 2020-02-29 (×2): 0.5 mg via INTRAVENOUS
  Filled 2020-02-29 (×2): qty 1

## 2020-02-29 NOTE — Progress Notes (Addendum)
Patient ID: [name], female   DOB: 1922-12-11, 86 y.o.   MRN: [id number]   [name] is a 86 year old female with a recent history of a T12 kyphoplasty by [name] on 02/20/2020.  PMH significant for anemia, GERD, hypertension.  Complicated pain.  Today I initially met with patient's daughter/[name] at the bedside at 8 AM for continued conversation regarding current medical situation.  Ongoing conversation regarding patient's condition, patient's behavior/yelling out and pain management strategies.  Patient is alert and oriented to person and place.  She recognizes her daughter.  Ongoing assessment of the patient's complaint of pain and the intermittent yelling out appears to be more closely related to anxiety and existential issues rather than then specifically only physical pain.   Daughter/[name] is in agreement Education offered regarding the connection between mind, body and spirit.  Education offered on the impact of anxiety, depression on pain.  Further exploration of history reveals that the patient has a long psychiatric history.  Per her daughter [name] has had multiple "nervous breakdowns", transcranial stimulation and ECT treatments.  Her psychiatrist is [name].  Geanie [name] and her therapist is [name].    I encouraged daughter today to include patient's spiritual  support person "[name]" into the holistic treatment plan.  He will visit today. I had a detailed conversation today with the patient's daughter/[name], her son [name] and [facility] administrator [name] for continued conversation regarding patient's current medical and psychiatric situation.  Detailed discussion was had regarding patient's medical situation specifically as it relates to back pain Patient has significant physiologic changes that does  explain significant pain.  MRI on 02/25/2020 significant for severe compression fracture T12 vertebral augmentation unchanged from prior studies.  7 mm retropulsion of  T12 and the spinal canal causing severe spinal stenosis and cord compression, unchanged from prior MRI Large calcified disc protrusion on the right at T6-7 with cord flattening, unchanged from prior studies.  Family verbalized an understanding of the psychiatric component.  Education offered regarding the difference between an aggressive medical intervention path and a palliative comfort path for this patient at this time in this situation.  Both daughter and son verbalize clearly that their focus for the patient at this point in time is comfort and dignity. Education offered regarding hospice eligibility and hospice benefit. Education offered on the natural trajectory and expectations at end of life.  Plan of Care: -DNR/DNI -No artificial feeding now or in the future -Avoid re hospitalizations -Symptom management specific to back pain, agitation and anxiety      -Minimize IV medications as we shift to oral or transdermal preparation for discharge      -Continue Celebrex 100 mg p.o. daily as well as Tylenol 1000 mg p.o. daily      -Initiate fentanyl patch 12 mcg an hour apply as directed      -Restart home meds of Remeron 15 mg p.o. at bedtime and Lexapro 5 mg twice daily Augment pharmaceuticals with holistic interventions       -[name] to visit today       -Request patient's therapist to visit at facility       -Family to add companions to care at facility along with massage therapist -Hospice referral made.  [name]/assisted living is requesting that equipment be delivered prior to patient's discharge    Update MOST form with assistance of hospice    Discussed with family  the importance of continued conversation with mother and each other  and the  medical providers regarding overall plan of care and treatment options,  ensuring decisions are within the context of the patients values and GOCs.  Questions and concerns addressed   Discussed with [name]  Total time spent on  the unit was 120 minutes  Greater than 50% of the time was spent in counseling and coordination of care  Wadie Lessen NP  Palliative Medicine Team Team Phone # [phone] Pager [phone]

## 2020-02-29 NOTE — TOC Initial Note (Signed)
Transition of Care [facility]) - Initial/Assessment Note    Patient Details  Name: [name] MRN: [id number] Date of Birth: June 23, 1923  Transition of Care [facility]) CM/SW Contact:    Curlene Labrum, RN Phone Number: 02/29/2020, 2:55 PM  Clinical Narrative:                 Spoke with Wadie Lessen on the phone regarding family's request to have patient cared for through [facility] - to return home to [facility] at [facility].  The patient will need a hospital bed through hospice and the facility will provide companions for the patient.  I called Venia Carbon with [facility] and she will reach out to family and get hospice care started for the patient.  In the meantime, Wadie Lessen is adjusting the patient's medications to alleviate patient's issues relating to pain and past medical issues for psych issues. Will continue to follow patient for hospitce needs and support.  I called the daughter, [name], and was unable to leave a message since her voice mail was full.  Expected Discharge Plan: Home w Hospice Care Barriers to Discharge: Continued Medical Work up (Pending set up with Home Hospice through [facility])   Patient Goals and CMS Choice Patient states their goals for this hospitalization and ongoing recovery are:: Family requesting Hospice care support through [place] to return to [facility] at [email or web address] Compare Post Acute Care list provided to:: Patient Represenative (must comment) Choice offered to / list presented to : [facility] POA / Guardian (daughter, [name])  Expected Discharge Plan and Services Expected Discharge Plan: [place]   Discharge Planning Services: CM Consult Post Acute Care Choice: Hospice Living arrangements for the past 2 months: [place] ([place] [facility] [place])                                      Prior Living Arrangements/Services Living arrangements for the past 2  months: [place] ([place] at [place]) Lives with:: Facility Resident Patient language and need for interpreter reviewed:: Yes Do you feel safe going back to the place where you live?: Yes      Need for Family Participation in Patient Care: Yes (Comment) Care giver support system in place?: Yes (comment)   Criminal Activity/Legal Involvement Pertinent to Current Situation/Hospitalization: No - Comment as needed  Activities of Daily Living Home Assistive Devices/Equipment: Eyeglasses, Cane (specify quad or straight), Hearing aid, Walker (specify type), Wheelchair ADL Screening (condition at time of admission) Patient's cognitive ability adequate to safely complete daily activities?: Yes Is the patient deaf or have difficulty hearing?: Yes Does the patient have difficulty seeing, even when wearing glasses/contacts?: No Does the patient have difficulty concentrating, remembering, or making decisions?: No Patient able to express need for assistance with ADLs?: Yes Does the patient have difficulty dressing or bathing?: No Independently performs ADLs?: Yes (appropriate for developmental age) Does the patient have difficulty walking or climbing stairs?: Yes Weakness of Legs: Both Weakness of Arms/Hands: None  Permission Sought/Granted Permission sought to share information with : Case Manager Permission granted to share information with : Yes, Verbal Permission Granted     Permission granted to share info w AGENCY: [place] - to provide care at home - at [facility] and [place] granted to share info w Relationship: daughter -  Betsy     Emotional Assessment Appearance:: Appears stated age Attitude/Demeanor/Rapport: Unable to Assess Affect (typically observed): Unable to Assess Orientation: : Oriented to Self, Oriented to Place Alcohol / Substance Use: Not Applicable Psych Involvement: No (comment)  Admission  diagnosis:  Back pain [M54.9] Compression fracture of T12 vertebra, initial encounter [facility]) [S22.080A] Patient Active Problem List   Diagnosis Date Noted  . Debility   . Pain management   . Encounter for hospice care discussion   . Palliative care encounter   . Back pain 02/25/2020  . T12 compression fracture ([place]) 01/01/2020  . Intractable pain 12/31/2019  . Low back pain 12/31/2019  . Palliative care by specialist   . Goals of care, counseling/discussion   . DNR (do not resuscitate)   . Compression fracture of T12 vertebra ([place]) 12/04/2019  . Spinal fracture of T12 vertebra ([place]) 12/04/2019  . Fever, unspecified 02/15/2016  . Anxiety 02/15/2016  . Anemia 02/15/2016  . Hyponatremia 02/15/2016  . Bradycardia   . Constipation   . Depression   . MDD (major depressive disorder), recurrent severe, without psychosis ([place])   . Symptomatic bradycardia 01/30/2016  . Symptomatic anemia 01/29/2016  . Hypertension   . Osteoporosis   . Hyperlipidemia   . GERD (gastroesophageal reflux disease)   . Spinal stenosis   . Hearing loss 12/08/2011  . Osteoarthritis of hip 12/02/2011  . Endometriosis   . DIVERTICULOSIS-COLON 02/12/2009  . ALLERGIC RHINITIS 01/19/2007  . ESOPHAGEAL STRICTURE 01/19/2007   PCP:  Harrison Mons, PA Pharmacy:  No Pharmacies Listed    Social Determinants of Health (SDOH) Interventions    Readmission Risk Interventions Readmission Risk Prevention Plan 02/29/2020 01/03/2020  Post Dischage Appt - Complete  Medication Screening - Complete  Transportation Screening Complete Complete  PCP or Specialist Appt within 5-7 Days Complete -  Home Care Screening Complete -  Medication Review (RN CM) Complete -  Some recent data might be hidden

## 2020-02-29 NOTE — Progress Notes (Signed)
PROGRESS NOTE    [name]  BJY:[id number] DOB: 26-Oct-1922 DOA: 02/25/2020 PCP: Harrison Mons, PA   Brief Narrative: [name] is a 86 y.o. female with medical history significant of T12 compression fracture, HTN. She presented secondary to back pain in setting of known compression fracture and spinal stenosis/cord compression. Pain management.   Assessment & Plan:   Active Problems:   Hypertension   Hyperlipidemia   GERD (gastroesophageal reflux disease)   Anemia   Compression fracture of T12 vertebra (HCC)   T12 compression fracture (HCC)   Back pain   Pain management   Encounter for hospice care discussion   Palliative care encounter   Debility   Back pain with depression and anxiety -Neurosurgery recommendations: Norflex (completed course) -Palliative care medicine for pain control: Celebrex, calcitonin, gabapentin, dilaudid prn, fentanyl patch added 8/11 -psych issues appear to be the main driver of her yelling out -back to facility with hospice in AM?  T12 compression fracture T12 spinal canal stenosis/cord compression Unchanged per neurosurgery. No plan for surgical management.  Anemia Chronic. Stable.  GERD On Prilosec as an outpatient  Hyperlipidemia On simvastatin as an outpatient  Essential hypertension Patient is on hydralazine and losartan as an outpatient. Losartan held on admission -Continue hydralazine and losartan   DVT prophylaxis: SCDs Code Status:   Code Status: DNR Disposition Plan: facility with hospice in the AM?   Consultants:   Neurosurgery  Palliative care medicine   Subjective: Goes from screaming to speaking normally on a dime  Objective: Vitals:   02/28/20 1454 02/28/20 1930 02/29/20 0335 02/29/20 0742  BP: 112/61 (!) 112/53 129/60 123/62  Pulse: 61 66 72 78  Resp: 18 17 18 17   Temp: 97.8 F (36.6 C) (!) 97.5 F (36.4 C) 98.8 F (37.1 C) 98.2 F (36.8 C)  TempSrc: Oral Oral Oral Oral  SpO2: 92% 95% 91%  94%  Weight:      Height:        Intake/Output Summary (Last 24 hours) at 02/29/2020 1501 Last data filed at 02/29/2020 0500 Gross per 24 hour  Intake 337 ml  Output 400 ml  Net -63 ml   Filed Weights   02/25/20 0925  Weight: 72.6 kg    Examination:   General: Appearance:     Overweight female in no acute distress           MS:   All extremities are intact.            Data Reviewed: I have personally reviewed following labs and imaging studies  CBC Lab Results  Component Value Date   WBC 9.7 02/29/2020   RBC 3.09 (L) 02/29/2020   HGB 9.3 (L) 02/29/2020   HCT 29.4 (L) 02/29/2020   MCV 95.1 02/29/2020   MCH 30.1 02/29/2020   PLT 283 02/29/2020   MCHC 31.6 02/29/2020   RDW 12.6 02/29/2020   LYMPHSABS 1.2 02/26/2020   MONOABS 1.0 02/26/2020   EOSABS 0.6 (H) 02/26/2020   BASOSABS 0.1 08/65/7846     Last metabolic panel Lab Results  Component Value Date   NA 132 (L) 02/29/2020   K 3.7 02/29/2020   CL 93 (L) 02/29/2020   CO2 28 02/29/2020   BUN 23 02/29/2020   CREATININE 0.73 02/29/2020   GLUCOSE 141 (H) 02/29/2020   GFRNONAA >60 02/29/2020   GFRAA >60 02/29/2020   CALCIUM 9.1 02/29/2020   PHOS 3.9 02/26/2020   PROT 6.3 (L) 02/26/2020   ALBUMIN 3.1 (L) 02/26/2020  BILITOT 0.6 02/26/2020   ALKPHOS 93 02/26/2020   AST 16 02/26/2020   ALT 12 02/26/2020   ANIONGAP 11 02/29/2020    CBG (last 3)  No results for input(s): GLUCAP in the last 72 hours.   GFR: Estimated Creatinine Clearance: 40.2 mL/min (by C-G formula based on SCr of 0.73 mg/dL).  Coagulation Profile: No results for input(s): INR, PROTIME in the last 168 hours.  Recent Results (from the past 240 hour(s))  SARS Coronavirus 2 by RT PCR (hospital order, performed in [facility] hospital lab) Nasopharyngeal Nasopharyngeal Swab     Status: None   Collection Time: 02/20/20  9:11 AM   Specimen: Nasopharyngeal Swab  Result Value Ref Range Status   SARS Coronavirus 2 NEGATIVE NEGATIVE  Final    Comment: (NOTE) SARS-CoV-2 target nucleic acids are NOT DETECTED.  The SARS-CoV-2 RNA is generally detectable in upper and lower respiratory specimens during the acute phase of infection. The lowest concentration of SARS-CoV-2 viral copies this assay can detect is 250 copies / mL. A negative result does not preclude SARS-CoV-2 infection and should not be used as the sole basis for treatment or other patient management decisions.  A negative result may occur with improper specimen collection / handling, submission of specimen other than nasopharyngeal swab, presence of viral mutation(s) within the areas targeted by this assay, and inadequate number of viral copies (<250 copies / mL). A negative result must be combined with clinical observations, patient history, and epidemiological information.  Fact Sheet for Patients:   StrictlyIdeas.no  Fact Sheet for Healthcare Providers: BankingDealers.co.za  This test is not yet approved or  cleared by the [place] FDA and has been authorized for detection and/or diagnosis of SARS-CoV-2 by FDA under an Emergency Use Authorization (EUA).  This EUA will remain in effect (meaning this test can be used) for the duration of the COVID-19 declaration under Section 564(b)(1) of the Act, 21 U.S.C. section 360bbb-3(b)(1), unless the authorization is terminated or revoked sooner.  Performed at [facility], [place] [address], [place], [place] [address]   Surgical pcr screen     Status: None   Collection Time: 02/20/20 10:02 AM   Specimen: Nasal Mucosa; Nasal Swab  Result Value Ref Range Status   MRSA, PCR NEGATIVE NEGATIVE Final   Staphylococcus aureus NEGATIVE NEGATIVE Final    Comment: (NOTE) The Xpert SA Assay (FDA approved for NASAL specimens in patients 33 years of age and older), is one component of a comprehensive surveillance program. It is not intended to diagnose infection  nor to guide or monitor treatment. Performed at [facility], [place] [address] [place], [place] [address]   SARS Coronavirus 2 by RT PCR (hospital order, performed in [facility] hospital lab) Nasopharyngeal Nasopharyngeal Swab     Status: None   Collection Time: 02/25/20 11:45 AM   Specimen: Nasopharyngeal Swab  Result Value Ref Range Status   SARS Coronavirus 2 NEGATIVE NEGATIVE Final    Comment: (NOTE) SARS-CoV-2 target nucleic acids are NOT DETECTED.  The SARS-CoV-2 RNA is generally detectable in upper and lower respiratory specimens during the acute phase of infection. The lowest concentration of SARS-CoV-2 viral copies this assay can detect is 250 copies / mL. A negative result does not preclude SARS-CoV-2 infection and should not be used as the sole basis for treatment or other patient management decisions.  A negative result may occur with improper specimen collection / handling, submission of specimen other than nasopharyngeal swab, presence  of viral mutation(s) within the areas targeted by this assay, and inadequate number of viral copies (<250 copies / mL). A negative result must be combined with clinical observations, patient history, and epidemiological information.  Fact Sheet for Patients:   StrictlyIdeas.no  Fact Sheet for Healthcare Providers: BankingDealers.co.za  This test is not yet approved or  cleared by the [place] FDA and has been authorized for detection and/or diagnosis of SARS-CoV-2 by FDA under an Emergency Use Authorization (EUA).  This EUA will remain in effect (meaning this test can be used) for the duration of the COVID-19 declaration under Section 564(b)(1) of the Act, 21 U.S.C. section 360bbb-3(b)(1), unless the authorization is terminated or revoked sooner.  Performed at [facility], [address] [place], [place] [address]         Radiology Studies: No results  found.      Scheduled Meds: . acetaminophen  1,000 mg Oral TID  . calcitonin ([place])  1 spray Alternating Nares Daily  . celecoxib  100 mg Oral Daily  . Chlorhexidine Gluconate Cloth  6 each Topical Daily  . docusate sodium  100 mg Oral BID  . escitalopram  5 mg Oral BID  . fentaNYL  1 patch Transdermal Q72H  . gabapentin  600 mg Oral QHS  . hydrALAZINE  25 mg Oral Q8H  . lidocaine  1 patch Transdermal Q24H  . losartan  50 mg Oral Daily  . mirtazapine  15 mg Oral QHS  . senna  1 tablet Oral BID  . simvastatin  20 mg Oral Daily  . sodium chloride flush  3 mL Intravenous Q12H   Continuous Infusions:   LOS: 2 days     Eulogio Bear DO [facility] 02/29/2020, 3:01 PM  If 7PM-7AM, please contact night-coverage [email or web address]

## 2020-02-29 NOTE — Progress Notes (Signed)
Physical Therapy Treatment Patient Details Name: [name] MRN: [id number] DOB: 13-Feb-1923 Today's Date: 02/29/2020    History of Present Illness 86 y.o. female admitted on 02/25/20 for back pain.  Pt with recent T12 kyphoplasty vertebroplasty on 02/20/20.  Neurosurgery re-consulted and CT and MRI show stable appearance of fx.  Palliative care has also been consulted.  Pt with significant PMH of R THA, polymyalgia rheumatic, HTN, hearing loss.      PT Comments    Pt is very anxious, often screaming without mobility or even without light touch of PT. Pain appears to be driven by anxiety throughout session as pt tolerates PROM to LEs much better when distracted and when eyes closed. PT encourages deep breathing throughout the session to relax patient. PT also provides education on chronic pain and the need for gradual progression of gentle ROM and mobility, however pt does not seem to retain much of this information during session. Pt will benefit from continued acute PT POC to provide relaxation strategies and to encourage progression of BLE ROM and mobility to tolerance. PT continues to recommend SNF placement at discharge.   Follow Up Recommendations  SNF     Equipment Recommendations  Wheelchair (measurements PT);Hospital bed;Other (comment) (hoyer lift)    Recommendations for Other Services       Precautions / Restrictions Precautions Precautions: Back;Fall Precaution Booklet Issued: No Precaution Comments: PT does not review back precautions as the pt is unwilling to participate in functional mobility Required Braces or Orthoses: Other Brace Spinal Brace: Lumbar corset Other Brace: not here this admission.  Restrictions Weight Bearing Restrictions: No    Mobility  Bed Mobility               General bed mobility comments: mobility deferred as pt declining and screaming in pain without movement despite sleeping soundly prior to PT entering room  Transfers                     Ambulation/Gait                 Stairs             Wheelchair Mobility    Modified Rankin (Stroke Patients Only)       Balance                                            Cognition Arousal/Alertness: Awake/alert Behavior During Therapy: Restless;Anxious Overall Cognitive Status: No family/caregiver present to determine baseline cognitive functioning Area of Impairment: Attention;Memory;Following commands;Safety/judgement;Awareness;Problem solving                   Current Attention Level: Focused Memory: Decreased recall of precautions;Decreased short-term memory Following Commands: Follows one step commands with increased time Safety/Judgement: Decreased awareness of safety;Decreased awareness of deficits Awareness: Intellectual Problem Solving: Requires verbal cues;Requires tactile cues General Comments: pt screaming prior to mobilizing or touch of PT. PT moves blanket and pt screams. Pt attempts to provide education on chronic pain and the need for gradual gentle mobility progression to improve activity tolerance and reduce pain. Pt appears to do much better when distracted or with eyes closed, otherwise is anxious and screams to touch or with miniscule movements      Exercises Other Exercises Other Exercises: BUE shoulder flexion, elbow flexion/extension, wrist flexion/extension, composite grip strength AROM x10 repetitions Other Exercises:  PROM R knee flexion/extension and hip flexion/extension 10 reps Other Exercises: bilateral ankle pumps x15 repetitions AROM    General Comments General comments (skin integrity, edema, etc.): pt vital signs remain stable with HR from 62-67 BPM aqnd SpO2 92-95% on 2L [place] throughout session. Vital signs do not change when pt screams out very loudly in pain vs when the pt is calmly resting      Pertinent Vitals/Pain Pain Assessment: 0-10 Pain Score: 9  Pain Location: legs Pain  Descriptors / Indicators: Burning Pain Intervention(s): Monitored during session    Home Living                      Prior Function            PT Goals (current goals can now be found in the care plan section) Acute Rehab PT Goals Patient Stated Goal: unable to state Progress towards PT goals: Not progressing toward goals - comment (limited by pain)    Frequency    Min 2X/week      PT Plan Current plan remains appropriate    Co-evaluation              AM-PAC PT "6 Clicks" Mobility   Outcome Measure  Help needed turning from your back to your side while in a flat bed without using bedrails?: Total Help needed moving from lying on your back to sitting on the side of a flat bed without using bedrails?: Total Help needed moving to and from a bed to a chair (including a wheelchair)?: Total Help needed standing up from a chair using your arms (e.g., wheelchair or bedside chair)?: Total Help needed to walk in hospital room?: Total Help needed climbing 3-5 steps with a railing? : Total 6 Click Score: 6    End of Session Equipment Utilized During Treatment: Oxygen Activity Tolerance: Patient limited by pain;Other (comment) (anxiety) Patient left: in bed;with call bell/phone within reach;with bed alarm set Nurse Communication: Mobility status PT Visit Diagnosis: Muscle weakness (generalized) (M62.81);Difficulty in walking, not elsewhere classified (R26.2);Pain Pain - Right/Left: Right Pain - part of body: Leg     Time: 9702-6378 PT Time Calculation (min) (ACUTE ONLY): 29 min  Charges:  $Therapeutic Exercise: 23-37 mins                     Zenaida Niece, PT, DPT Acute Rehabilitation Pager: [phone]    Zenaida Niece 02/29/2020, 2:38 PM

## 2020-02-29 NOTE — Progress Notes (Addendum)
Manufacturing engineer [facility])  Referral received for hospice at [facility].  Spoke at length with daughter [name].  She explains how vibrant her mother was until May when she sustained a vertebral fracture.  Offered support and provided information.   Plan is to d/c to [facility] for end of life care.  Will need ambulance transport.  Will need O2 and hospital bed, [facility] will order.  Please send completed DNR home with pt as well as arrange any comfort meds that may be needed prior to hospice arriving so there is no lapse in her comfort.  Venia Carbon BSN, RN, [facility] Liaison    **confirmed with Threasa Beards at [facility], they are willing to accept Mrs. Marulanda back to her AL room with hospital bed and O2, which have been ordered by [facility].

## 2020-02-29 NOTE — Plan of Care (Signed)

## 2020-02-29 NOTE — Progress Notes (Signed)
OT Cancellation Note  Patient Details Name: [name] MRN: [id number] DOB: [birth year]   Cancelled Treatment:    Reason Eval/Treat Not Completed: Pain limiting ability to participate. Plan is for pt to d/c to ALF with end of life hospice care. OT will sign off.   [name] 02/29/2020, 3:53 PM  Nestor Lewandowsky, OTR/L Acute Rehabilitation Services Pager: [phone] Office: [phone]

## 2020-03-01 ENCOUNTER — Ambulatory Visit: Payer: Medicare HMO | Admitting: Internal Medicine

## 2020-03-01 ENCOUNTER — Telehealth: Payer: Self-pay | Admitting: Physician Assistant

## 2020-03-01 DIAGNOSIS — S22080A Wedge compression fracture of T11-T12 vertebra, initial encounter for closed fracture: Secondary | ICD-10-CM | POA: Diagnosis not present

## 2020-03-01 MED ORDER — ESCITALOPRAM OXALATE 5 MG PO TABS: 5.0000 mg | ORAL_TABLET | Freq: Two times a day (BID) | ORAL | 0 refills | Status: AC

## 2020-03-01 MED ORDER — CLONAZEPAM 0.5 MG PO TABS
0.5000 mg | ORAL_TABLET | Freq: Two times a day (BID) | ORAL | Status: DC
  Administered 2020-03-01: 0.5 mg via ORAL
  Filled 2020-03-01 (×2): qty 1

## 2020-03-01 MED ORDER — OXYCODONE HCL 5 MG PO TABS
5.0000 mg | ORAL_TABLET | Freq: Four times a day (QID) | ORAL | Status: DC | PRN
  Administered 2020-03-01 – 2020-03-02 (×3): 5 mg via ORAL
  Filled 2020-03-01 (×3): qty 1

## 2020-03-01 MED ORDER — ACETAMINOPHEN 500 MG PO TABS: 1000.0000 mg | ORAL_TABLET | Freq: Three times a day (TID) | ORAL | 0 refills | Status: AC

## 2020-03-01 MED ORDER — FENTANYL 12 MCG/HR TD PT72: 1.0000 | MEDICATED_PATCH | TRANSDERMAL | 0 refills | Status: AC

## 2020-03-01 MED ORDER — OXYCODONE HCL 5 MG PO TABS: 5.0000 mg | ORAL_TABLET | Freq: Four times a day (QID) | ORAL | 0 refills | Status: AC | PRN

## 2020-03-01 MED ORDER — CELECOXIB 100 MG PO CAPS: 100.0000 mg | ORAL_CAPSULE | Freq: Every day | ORAL | 0 refills | Status: AC

## 2020-03-01 MED ORDER — CLONAZEPAM 0.5 MG PO TABS
0.5000 mg | ORAL_TABLET | Freq: Once | ORAL | Status: AC
  Administered 2020-03-01: 0.5 mg via ORAL
  Filled 2020-03-01: qty 1

## 2020-03-01 MED ORDER — CLONAZEPAM 0.5 MG PO TABS: 0.5000 mg | ORAL_TABLET | Freq: Two times a day (BID) | ORAL | 0 refills | Status: AC

## 2020-03-01 NOTE — Progress Notes (Signed)
Manufacturing engineer [facility])  Hospital liaison spoke with daughter [name] to continue education related to hospice philosophy and services and to answer any questions at this time. [name] shared reservations about using PTAR services.  This RN discussed alternatives like private vehicle which [name] confirms is not a viable option.  This RN answered questions about  Hospice doctors and after hour emergency procedures.  Betsy  verbalized understanding of information given.  Per discussion the plan is to discharge home tomorrow morning by PTAR in time for an 11am admission visit.    [place] send signed and completed DNR home with pt/family.  Please provide prescriptions at discharge as needed to ensure ongoing symptom management.    DME (hopital bed and supplemental O2) have been delivered.  [facility] information and contact numbers given to [facility].  Please call with any questions or concerns.  Thank you for the opportunity to participate in this pt's care.  Domenic Moras, BSN, RN [facility] [phone] (24h on call)

## 2020-03-01 NOTE — Discharge Summary (Addendum)
Physician Discharge Summary  [name] VOP:[id number] DOB: 1923-02-06 DOA: 02/25/2020  PCP: Harrison Mons, PA  Admit date: 02/25/2020 Discharge date: 03/01/2020  Admitted From: ALF Discharge disposition: ALF with hospice  Recommendations for Outpatient Follow-Up:   1. Hospice 2. Klonopin scheduled added 3. Consider anti-psychotic like seroquel or zyprexa and titration of lexapro 4. (Lumbar corset) Brace when out of bed, out of bed if able/willing    Discharge Diagnosis:   Active Problems:   Hypertension   Hyperlipidemia   GERD (gastroesophageal reflux disease)   Anemia   Compression fracture of T12 vertebra (HCC)   T12 compression fracture (HCC)   Back pain   Pain management   Encounter for hospice care discussion   Palliative care encounter   Debility    Discharge Condition: Improved.  Diet recommendation:Regular.  Wound care: None.  Code status: dnr   History of Present Illness:   [name] is a 86 y.o. female with medical history significant of T12 compression fracture, HTN    Presented with severe pain in her back radiating down her legs.  She denies any numbness.  But pain is severe and worse with any movement.  Of note as per review of records PT noted patient was crying out in pain at the time of her discharge but was able to be redirected. Otherwise no bowel or bladder incontinence. Patient has been taking oxycodone for pain control. Imaging showed no change to explain her pain. Pt sp T12 kyphoplasty vertebroplasty by Dr. Trenton Gammon last week. Patient originally had T12 vertibral compression fracture in June after a fall.  severe T12 compression fracture  with osteonecrosis and retropulsion resulting in moderate mass-effect  Initially patient was taken to [facility] where she received 8 mg of morphine 2 mg of Valium and 50 mcg of fentanyl resulting in respiratory depression secondary to narcotic administration but continued to scream  out in pain. She was transferred to [facility], ER for obtaining MRI  she is wheal chair bound   Hospital Course by Problem:   Back pain with depression and anxiety -Neurosurgery recommendations: Norflex (completed course) -psych issues appear to be the main driver of her yelling out -back to facility with hospice -recommendations per palliative care: Plan of Care: -DNR/DNI -No artificial feeding now or in the future -Avoid re hospitalizations -Symptom management specific to back pain, agitation and anxiety      -Minimize IV medications as we shift to oral or transdermal preparation for discharge      -Continue Celebrex 100 mg p.o. daily as well as Tylenol 1000 mg p.o. daily      -Initiate fentanyl patch 12 mcg an hour apply as directed      -Restart home meds of Remeron 15 mg p.o. at bedtime and Lexapro 5 mg twice daily Augment pharmaceuticals with holistic interventions       -Lamonte Richer to visit today       -Request patient's therapist to visit at facility       -Family to add companions to care at facility along with massage therapist -Hospice referral made.  [facility]/assisted living is requesting that equipment be delivered prior to patient's discharge  T12 compression fracture T12 spinal canal stenosis/cord compression Unchanged per neurosurgery. No plan for surgical management.  Anemia Chronic. Stable.  GERD On Prilosec as an outpatient  Hyperlipidemia On simvastatin as an outpatient  Essential hypertension Patient is on hydralazine and losartan as an outpatient. Losartan held  on admission -Continue hydralazine and losartan      Medical Consultants:   NS Palliative care   Discharge Exam:   Vitals:   03/01/20 0409 03/01/20 0846  BP: (!) 147/71 (!) 149/69  Pulse: 67 72  Resp: 14 16  Temp: 98.3 F (36.8 C) 98.5 F (36.9 C)  SpO2: 97% 98%   Vitals:   02/29/20 1544 02/29/20 1900 03/01/20 0409 03/01/20 0846  BP: (!) 141/63 (!) 152/69 (!)  147/71 (!) 149/69  Pulse: 68 63 67 72  Resp: 19 18 14 16   Temp: 98.5 F (36.9 C) 98.6 F (37 C) 98.3 F (36.8 C) 98.5 F (36.9 C)  TempSrc:  Oral Oral Oral  SpO2: 100% 98% 97% 98%  Weight:      Height:        General exam: Appears calm and comfortable at times then will yell out briefly and then stop    The results of significant diagnostics from this hospitalization (including imaging, microbiology, ancillary and laboratory) are listed below for reference.     Procedures and Diagnostic Studies:   CT Thoracic Spine Wo Contrast  Result Date: 02/25/2020 CLINICAL DATA:  Spine fracture, thoracic, traumatic. Additional provided: Surgery on Monday for back, pain despite meds. EXAM: CT THORACIC SPINE WITHOUT CONTRAST TECHNIQUE: Multidetector CT images of the thoracic were obtained using the standard protocol without intravenous contrast. COMPARISON:  Thoracic spine MRI 12/31/2019, intra procedural fluoroscopic images of the lower thoracic spine from vertebral augmentation performed 02/20/2020. FINDINGS: Alignment: 7 mm bony retropulsion at the T12 level, which may be slightly progressed as compared to the MRI of 12/31/2019. No significant spondylolisthesis. Vertebrae: Redemonstrated severe T12 compression fracture with 7 mm bony retropulsion. There has been interval vertebral augmentation at this level. Vertebral body height is otherwise maintained. No interval sure is identified. Chronic deformity of the T11 spinous process and lamina which is at least partially congenital. Paraspinal and other soft tissues: Redemonstrated severe T12 Disc levels: Moderate disc space narrowing throughout the thoracic spine. At T6-T7, there is a somewhat prominent calcified central disc herniate which contributes to mild spinal canal stenosis and resultant mild spinal cord mass effect on the prior MRI of 12/31/2019. At the T12 level, bony retropulsion contributes to severe spinal canal stenosis with likely persistent  mass effect upon the conus medullaris. No appreciable significant spinal canal stenosis at the remaining levels. IMPRESSION: Redemonstrated severe T12 vertebral compression fracture. There has been interval vertebral augmentation since the thoracic spine MRI of 12/31/2019. There is 7 mm bony retropulsion at this level which may be slightly progressed. Severe spinal canal stenosis at this level with likely persistent impingement of the conus medullaris. Thoracic spine MRI may be obtained to assess for spinal cord signal abnormality, as clinically warranted. No evidence of interval thoracic spine fracture. Thoracic spondylosis as described. Most notably at T6-T7, a somewhat prominent central calcified disc herniation contributes to mild spinal canal stenosis and there was mild spinal spinal cord mass effect at this site on the recent prior MRI. Electronically Signed   By: Kellie Simmering DO   On: 02/25/2020 10:58   CT Lumbar Spine Wo Contrast  Result Date: 02/25/2020 CLINICAL DATA:  Lumbar radiculopathy, trauma. EXAM: CT LUMBAR SPINE WITHOUT CONTRAST TECHNIQUE: Multidetector CT imaging of the lumbar spine was performed without intravenous contrast administration. Multiplanar CT image reconstructions were also generated. COMPARISON:  Lumbar spine MRI 12/31/2019, CT of the lumbar spine 12/31/2019 FINDINGS: Segmentation: Lumbar dextrocurvature.  5 lumbar vertebrae. Alignment: Trace L1-L2, L4-L5  and L5-S1 grade 1 anterolisthesis. Vertebrae: Please refer to concurrently performed thoracic spine CT for description of findings at the T12 level. Lumbar vertebral body height is maintained. Redemonstrated subacute bilateral L5 transverse process fractures (series 3, image 88). No evidence of interval fracture to the lumbar spine. Paraspinal and other soft tissues: Aortoiliac atherosclerosis. Atrophy of the lumbar paraspinal musculature. Disc levels: Multilevel disc space narrowing and disc vacuum phenomenon. Disc space  narrowing is greatest at L1-L2, L2-L3 and L4-L5 (moderate in severity at these levels). Multilevel disc bulges and endplate spurring as well as multilevel facet arthrosis and ligamentum flavum hypertrophy. No more than mild appreciable spinal canal stenosis at any level. Unchanged multilevel neural foraminal narrowing. Multilevel neural foraminal narrowing greatest bilaterally at L4-L5 (moderately severe at these sites). IMPRESSION: Please refer to the concurrently performed thoracic spine CT for a description of findings at the T12 level. Redemonstrated subacute bilateral L5 transverse process fractures. No evidence of interval fracture to the lumbar spine. Lumbar spondylosis as described. No more than mild appreciable spinal canal stenosis at any level. Multilevel neural foraminal narrowing. Most notably there is moderate/severe bilateral neural foraminal narrowing at L4-L5. Lumbar dextrocurvature. Trace multilevel spondylolisthesis as described. Aortic Atherosclerosis (ICD10-I70.0). Electronically Signed   By: Kellie Simmering DO   On: 02/25/2020 11:13   MR THORACIC SPINE WO CONTRAST  Result Date: 02/25/2020 CLINICAL DATA:  Posttraumatic spinal fracture. T12 compression fracture identified on Nov 24, 2019. Multiple follow-up studies. Subsequent kyphoplasty T12 on 02/20/2020 EXAM: MRI THORACIC SPINE WITHOUT CONTRAST TECHNIQUE: Multiplanar, multisequence MR imaging of the thoracic spine was performed. No intravenous contrast was administered. COMPARISON:  CT thoracic spine 02/25/2020. MRI thoracic spine 12/31/2019 FINDINGS: Alignment:  2 mm anterolisthesis C7-T1.  Remaining alignment normal. Vertebrae: Severe compression fracture of T12 with vertebral augmentation. Cement in satisfactory position. Left-sided approach. There is 7 mm of retropulsion of T12 vertebral body into the canal causing severe spinal stenosis. This is similar to the prior MRI of 12/31/2019 No other thoracic fracture identified. Negative for  metastatic disease. Cord: Severe spinal stenosis and cord compression at T12 unchanged from prior MRI of 12/31/2019. No cord signal abnormality. Large calcified disc protrusion on the right T6-7 with significant flattening of the cord ventrally and on the right side unchanged from the prior MRI. No cord signal abnormality. Paraspinal and other soft tissues: Negative for paraspinous mass edema or fluid collection. Disc levels: C7-T1: 2 mm anterolisthesis with facet degeneration. This is causing moderate foraminal stenosis bilaterally. T6-7: Large calcified disc protrusion to the right of midline with extensive cord flattening. There is sufficient CSF posterior to the cord at this level. No other levels of disc protrusion or stenosis in the thoracic spine. IMPRESSION: Severe compression fracture T12 vertebral augmentation unchanged from prior studies. 7 mm retropulsion of T12 into the spinal canal causing severe spinal stenosis and cord compression, unchanged from the prior MRI. No cord signal abnormality. Large calcified disc protrusion on the right at T6-7 with cord flattening, unchanged from prior studies. Electronically Signed   By: Franchot Gallo M.D.   On: 02/25/2020 16:11   ECHOCARDIOGRAM COMPLETE  Result Date: 02/26/2020    ECHOCARDIOGRAM REPORT   Patient Name:   [name] Date of Exam: 02/26/2020 Medical Rec #:  [id number]    Height:       63.0 in Accession #:    [id number]   Weight:       160.0 lb Date of Birth:  07-18-1923     BSA:  1.759 m Patient Age:    86 years     BP:           172/94 mmHg Patient Gender: F            HR:           77 bpm. Exam Location:  [place] Procedure: 2D Echo Indications:    Murmur 785.2 / R01.1  History:        Patient has no prior history of Echocardiogram examinations.                 Arrythmias:Bradycardia; Risk Factors:Hypertension and                 Dyslipidemia.  Sonographer:    Luane School Referring Phys: Navarro  1. Left ventricular  ejection fraction, by estimation, is 70 to 75%. The left ventricle has hyperdynamic function. The left ventricle has no regional wall motion abnormalities. There is moderate concentric left ventricular hypertrophy. Left ventricular diastolic parameters are consistent with Grade I diastolic dysfunction (impaired relaxation). Elevated left ventricular end-diastolic pressure.  2. Right ventricular systolic function is normal. The right ventricular size is normal. There is moderately elevated pulmonary artery systolic pressure. The estimated right ventricular systolic pressure is 99.3 mmHg.  3. Left atrial size was mildly dilated.  4. The mitral valve is normal in structure. Mild mitral valve regurgitation. No evidence of mitral stenosis.  5. The aortic valve has an indeterminant number of cusps. Aortic valve regurgitation is trivial. Moderate aortic valve stenosis. Aortic valve area, by VTI measures 1.36 cm. Aortic valve Vmax measures 3.22 m/s.  6. Aortic dilatation noted. There is moderate dilatation of the ascending aorta measuring 44 mm.  7. The inferior vena cava is normal in size with greater than 50% respiratory variability, suggesting right atrial pressure of 3 mmHg. FINDINGS  Left Ventricle: Left ventricular ejection fraction, by estimation, is 70 to 75%. The left ventricle has hyperdynamic function. The left ventricle has no regional wall motion abnormalities. The left ventricular internal cavity size was normal in size. There is moderate concentric left ventricular hypertrophy. Left ventricular diastolic parameters are consistent with Grade I diastolic dysfunction (impaired relaxation). Elevated left ventricular end-diastolic pressure. Right Ventricle: The right ventricular size is normal. No increase in right ventricular wall thickness. Right ventricular systolic function is normal. There is moderately elevated pulmonary artery systolic pressure. The tricuspid regurgitant velocity is 3.90 m/s, and with an  assumed right atrial pressure of 3 mmHg, the estimated right ventricular systolic pressure is 57.0 mmHg. Left Atrium: Left atrial size was mildly dilated. Right Atrium: Right atrial size was normal in size. Pericardium: There is no evidence of pericardial effusion. Mitral Valve: The mitral valve is normal in structure. Normal mobility of the mitral valve leaflets. Severe mitral annular calcification. Mild mitral valve regurgitation. No evidence of mitral valve stenosis. Tricuspid Valve: The tricuspid valve is normal in structure. Tricuspid valve regurgitation is mild . No evidence of tricuspid stenosis. Aortic Valve: The aortic valve has an indeterminant number of cusps. . There is severe thickening and severe calcifcation of the aortic valve. Aortic valve regurgitation is trivial. Moderate aortic stenosis is present. There is severe thickening of the aortic valve. There is severe calcifcation of the aortic valve. Aortic valve mean gradient measures 28.0 mmHg. Aortic valve peak gradient measures 41.4 mmHg. Aortic valve area, by VTI measures 1.36 cm. Pulmonic Valve: The pulmonic valve was normal in structure. Pulmonic valve regurgitation is not visualized. No  evidence of pulmonic stenosis. Aorta: Aortic dilatation noted. There is moderate dilatation of the ascending aorta measuring 44 mm. Venous: The inferior vena cava is normal in size with greater than 50% respiratory variability, suggesting right atrial pressure of 3 mmHg. IAS/Shunts: No atrial level shunt detected by color flow Doppler.  LEFT VENTRICLE PLAX 2D LVIDd:         5.20 cm  Diastology LVIDs:         2.85 cm  LV e' lateral:   5.55 cm/s LV PW:         1.40 cm  LV E/e' lateral: 17.7 LV IVS:        1.40 cm  LV e' medial:    3.48 cm/s LVOT diam:     2.00 cm  LV E/e' medial:  28.2 LV SV:         86 LV SV Index:   49 LVOT Area:     3.14 cm  RIGHT VENTRICLE             IVC RV S prime:     17.00 cm/s  IVC diam: 1.60 cm TAPSE (M-mode): 3.1 cm LEFT ATRIUM              Index LA diam:        2.70 cm 1.54 cm/m LA Vol (A2C):   54.8 ml 31.16 ml/m LA Vol (A4C):   86.3 ml 49.07 ml/m LA Biplane Vol: 69.6 ml 39.57 ml/m  AORTIC VALVE AV Area (Vmax):    1.27 cm AV Area (Vmean):   1.18 cm AV Area (VTI):     1.36 cm AV Vmax:           321.67 cm/s AV Vmean:          254.000 cm/s AV VTI:            0.631 m AV Peak Grad:      41.4 mmHg AV Mean Grad:      28.0 mmHg LVOT Vmax:         130.00 cm/s LVOT Vmean:        95.500 cm/s LVOT VTI:          0.274 m LVOT/AV VTI ratio: 0.43  AORTA Ao Root diam: 3.20 cm Ao Asc diam:  4.45 cm MITRAL VALVE                TRICUSPID VALVE MV Area (PHT): 2.30 cm     TR Peak grad:   60.8 mmHg MV Decel Time: 330 msec     TR Vmax:        390.00 cm/s MV E velocity: 98.30 cm/s MV A velocity: 154.00 cm/s  SHUNTS MV E/A ratio:  0.64         Systemic VTI:  0.27 m                             Systemic Diam: 2.00 cm Fransico Him MD Electronically signed by Fransico Him MD Signature Date/Time: 02/26/2020/12:53:16 PM    Final    DG Hip Unilat W or Wo Pelvis 2-3 Views Right  Result Date: 02/25/2020 CLINICAL DATA:  Right hip pain. Recent T12 back surgery. No reported injury. EXAM: DG HIP (WITH OR WITHOUT PELVIS) 2-3V RIGHT COMPARISON:  09/28/2017 hip radiographs FINDINGS: Right total hip arthroplasty with no evidence of hardware fracture or loosening. No right hip dislocation. No acute osseous fracture. No pelvic diastasis. Mild left hip osteoarthritis.  Degenerative changes in the visualized lower lumbar spine. No suspicious focal osseous lesions. IMPRESSION: Right total hip arthroplasty, with no evidence of hardware complication. No acute osseous abnormality. No right hip dislocation. Electronically Signed   By: Ilona Sorrel M.D.   On: 02/25/2020 11:20     Labs:   Basic Metabolic Panel: Recent Labs  Lab 02/25/20 1125 02/25/20 1125 02/25/20 2225 02/26/20 0224 02/29/20 1031  NA 136  --   --  135 132*  K 3.9   < >  --  4.0 3.7  CL 97*  --   --  95* 93*    CO2 29  --   --  30 28  GLUCOSE 110*  --   --  117* 141*  BUN 24*  --   --  17 23  CREATININE 0.78  --   --  0.70 0.73  CALCIUM 9.1  --   --  9.4 9.1  MG  --   --  1.7 1.7  --   PHOS  --   --   --  3.9  --    < > = values in this interval not displayed.   GFR Estimated Creatinine Clearance: 40.2 mL/min (by C-G formula based on SCr of 0.73 mg/dL). Liver Function Tests: Recent Labs  Lab 02/26/20 0224  AST 16  ALT 12  ALKPHOS 93  BILITOT 0.6  PROT 6.3*  ALBUMIN 3.1*   No results for input(s): LIPASE, AMYLASE in the last 168 hours. No results for input(s): AMMONIA in the last 168 hours. Coagulation profile No results for input(s): INR, PROTIME in the last 168 hours.  CBC: Recent Labs  Lab 02/25/20 1125 02/26/20 0224 02/29/20 1031  WBC 10.5 11.1* 9.7  NEUTROABS 7.7 8.2*  --   HGB 10.1* 10.2* 9.3*  HCT 31.5* 33.4* 29.4*  MCV 95.7 95.7 95.1  PLT 288 295 283   Cardiac Enzymes: No results for input(s): CKTOTAL, CKMB, CKMBINDEX, TROPONINI in the last 168 hours. BNP: Invalid input(s): POCBNP CBG: No results for input(s): GLUCAP in the last 168 hours. D-Dimer No results for input(s): DDIMER in the last 72 hours. Hgb A1c No results for input(s): HGBA1C in the last 72 hours. Lipid Profile No results for input(s): CHOL, HDL, LDLCALC, TRIG, CHOLHDL, LDLDIRECT in the last 72 hours. Thyroid function studies No results for input(s): TSH, T4TOTAL, T3FREE, THYROIDAB in the last 72 hours.  Invalid input(s): FREET3 Anemia work up No results for input(s): VITAMINB12, FOLATE, FERRITIN, TIBC, IRON, RETICCTPCT in the last 72 hours. Microbiology Recent Results (from the past 240 hour(s))  SARS Coronavirus 2 by RT PCR (hospital order, performed in [facility] hospital lab) Nasopharyngeal Nasopharyngeal Swab     Status: None   Collection Time: 02/20/20  9:11 AM   Specimen: Nasopharyngeal Swab  Result Value Ref Range Status   SARS Coronavirus 2 NEGATIVE NEGATIVE Final    Comment:  (NOTE) SARS-CoV-2 target nucleic acids are NOT DETECTED.  The SARS-CoV-2 RNA is generally detectable in upper and lower respiratory specimens during the acute phase of infection. The lowest concentration of SARS-CoV-2 viral copies this assay can detect is 250 copies / mL. A negative result does not preclude SARS-CoV-2 infection and should not be used as the sole basis for treatment or other patient management decisions.  A negative result may occur with improper specimen collection / handling, submission of specimen other than nasopharyngeal swab, presence of viral mutation(s) within the areas targeted by this assay, and inadequate number of viral  copies (<250 copies / mL). A negative result must be combined with clinical observations, patient history, and epidemiological information.  Fact Sheet for Patients:   StrictlyIdeas.no  Fact Sheet for Healthcare Providers: BankingDealers.co.za  This test is not yet approved or  cleared by the [place] FDA and has been authorized for detection and/or diagnosis of SARS-CoV-2 by FDA under an Emergency Use Authorization (EUA).  This EUA will remain in effect (meaning this test can be used) for the duration of the COVID-19 declaration under Section 564(b)(1) of the Act, 21 U.S.C. section 360bbb-3(b)(1), unless the authorization is terminated or revoked sooner.  Performed at [facility], [place] [address], [place], [place] [address]   Surgical pcr screen     Status: None   Collection Time: 02/20/20 10:02 AM   Specimen: Nasal Mucosa; Nasal Swab  Result Value Ref Range Status   MRSA, PCR NEGATIVE NEGATIVE Final   Staphylococcus aureus NEGATIVE NEGATIVE Final    Comment: (NOTE) The Xpert SA Assay (FDA approved for NASAL specimens in patients 73 years of age and older), is one component of a comprehensive surveillance program. It is not intended to diagnose infection nor to guide or  monitor treatment. Performed at [facility], [place] [address], [place], [place] [address]   SARS Coronavirus 2 by RT PCR (hospital order, performed in [facility] hospital lab) Nasopharyngeal Nasopharyngeal Swab     Status: None   Collection Time: 02/25/20 11:45 AM   Specimen: Nasopharyngeal Swab  Result Value Ref Range Status   SARS Coronavirus 2 NEGATIVE NEGATIVE Final    Comment: (NOTE) SARS-CoV-2 target nucleic acids are NOT DETECTED.  The SARS-CoV-2 RNA is generally detectable in upper and lower respiratory specimens during the acute phase of infection. The lowest concentration of SARS-CoV-2 viral copies this assay can detect is 250 copies / mL. A negative result does not preclude SARS-CoV-2 infection and should not be used as the sole basis for treatment or other patient management decisions.  A negative result may occur with improper specimen collection / handling, submission of specimen other than nasopharyngeal swab, presence of viral mutation(s) within the areas targeted by this assay, and inadequate number of viral copies (<250 copies / mL). A negative result must be combined with clinical observations, patient history, and epidemiological information.  Fact Sheet for Patients:   StrictlyIdeas.no  Fact Sheet for Healthcare Providers: BankingDealers.co.za  This test is not yet approved or  cleared by the [place] FDA and has been authorized for detection and/or diagnosis of SARS-CoV-2 by FDA under an Emergency Use Authorization (EUA).  This EUA will remain in effect (meaning this test can be used) for the duration of the COVID-19 declaration under Section 564(b)(1) of the Act, 21 U.S.C. section 360bbb-3(b)(1), unless the authorization is terminated or revoked sooner.  Performed at [facility], [address], [place], [place] [address]      Discharge Instructions:   Discharge Instructions     Diet general   Complete by: As directed    Increase activity slowly   Complete by: As directed    No wound care   Complete by: As directed      Allergies as of 03/01/2020      Reactions   Cartia Xt [diltiazem] Other (See Comments)   BRADYCARDIA IN 40'S   Codeine Other (See Comments)   syncope   Lisinopril-hydrochlorothiazide Cough   Mercury Rash   Sulfa Antibiotics Rash   Sulfonamide Derivatives Rash  swelling      Medication List    STOP taking these medications   DULoxetine 30 MG capsule Commonly known as: CYMBALTA   enoxaparin 40 MG/0.4ML injection Commonly known as: LOVENOX   meloxicam 7.5 MG tablet Commonly known as: MOBIC   Narcan 4 MG/0.1ML Liqd nasal spray kit Generic drug: naloxone   predniSONE 5 MG tablet Commonly known as: DELTASONE     TAKE these medications   acetaminophen 500 MG tablet Commonly known as: TYLENOL Take 2 tablets (1,000 mg total) by mouth 3 (three) times daily. What changed:   when to take this  Another medication with the same name was removed. Continue taking this medication, and follow the directions you see here.   Aspercreme Lidocaine 4 % Ptch Generic drug: Lidocaine Place 1 patch onto the skin See admin instructions. Apply one patch daily - remove after 12 hours (12 hours on, 12 hours off) - for back pain   bisacodyl 5 MG EC tablet Commonly known as: DULCOLAX Take 1 tablet (5 mg total) by mouth daily as needed for moderate constipation.   calcitonin (salmon) 200 UNIT/ACT nasal spray Commonly known as: MIACALCIN/FORTICAL Place 1 spray into alternate nostrils daily at 6 PM. What changed: when to take this   celecoxib 100 MG capsule Commonly known as: CELEBREX Take 1 capsule (100 mg total) by mouth daily.   docusate sodium 100 MG capsule Commonly known as: COLACE Take 2 capsules (200 mg total) by mouth 2 (two) times daily.   escitalopram 5 MG tablet Commonly known as: LEXAPRO Take 1 tablet (5 mg total) by mouth 2  (two) times daily.   feeding supplement (ENSURE ENLIVE) Liqd Take 237 mLs by mouth 2 (two) times daily between meals. Give 2 week supply What changed:   when to take this  additional instructions   fentaNYL 12 MCG/HR Commonly known as: [place] 1 patch onto the skin every 3 (three) days. Start taking on: March 03, 2020   gabapentin 300 MG capsule Commonly known as: NEURONTIN Take 300 mg by mouth 2 (two) times daily. What changed: Another medication with the same name was removed. Continue taking this medication, and follow the directions you see here.   hydrALAZINE 25 MG tablet Commonly known as: APRESOLINE Take 1 tablet (25 mg total) by mouth every 8 (eight) hours. What changed: additional instructions   losartan 50 MG tablet Commonly known as: COZAAR Take 50 mg by mouth daily.   mirtazapine 15 MG tablet Commonly known as: REMERON Take 15 mg by mouth 2 (two) times daily.   omeprazole 20 MG capsule Commonly known as: PRILOSEC Take 1 capsule (20 mg total) by mouth daily.   oxyCODONE 5 MG immediate release tablet Commonly known as: Oxy IR/ROXICODONE Take 1 tablet (5 mg total) by mouth every 6 (six) hours as needed for breakthrough pain. What changed:   reasons to take this  Another medication with the same name was removed. Continue taking this medication, and follow the directions you see here.   polyethylene glycol powder 17 GM/SCOOP powder Commonly known as: MiraLax Take 17 g by mouth 2 (two) times daily as needed for moderate constipation.   senna-docusate 8.6-50 MG tablet Commonly known as: Senokot-S Take 1 tablet by mouth 2 (two) times daily between meals as needed for mild constipation.   simvastatin 20 MG tablet Commonly known as: ZOCOR TAKE 1 TABLET EVERY DAY   Vitamin D3 50 MCG (2000 UT) Tabs Take 2,000 Units by mouth daily.  Follow-up Information    Harrison Mons, PA Follow up in 1 week(s).   Specialty: Family Medicine Contact  information: [address] [place] [id number] [phone]                Time coordinating discharge: 35 min  Signed:  Geradine Girt DO  [facility] 03/01/2020, 9:04 AM

## 2020-03-01 NOTE — Plan of Care (Signed)

## 2020-03-01 NOTE — Telephone Encounter (Signed)
Albina Billet from [place] was calling to see if Dr. Jerilee Hoh will be the attending physician in the facility,  Please call [phone]  Please advise

## 2020-03-01 NOTE — TOC Transition Note (Signed)
Transition of Care [facility]) - CM/SW Discharge Note   Patient Details  Name: [name] MRN: [id number] Date of Birth: 07-17-1923  [facility]) CM/SW Contact:  Curlene Labrum, RN Phone Number: 03/01/2020, 4:46 PM   Clinical Narrative:    Case management spoke with the patient's daughter this morning regarding transitions to home with hospice for the patient at [place] [facility].  The patients daughter is going to provide companions at the home to assist as sisters along with the support of aides at the assisted living facility.  I spoke with [facility] - [facility] - [phone] and the hospital bed and oxygen just arrived at the patient's apartment late this afternoon at 3:30 pm. I spoke with the daughter and the daughter is requesting that the patient be transferred in the morning because she did not want her mom being delivered to the home late and would like to have the hospice nurse visit in the morning.  I called Chrislyn with [facility] this morning and this afternoon to arrange coordination of services and transfer of the patient to home.  Cryslyn is calling the patient's daughter now to discuss the best time for transfer of the patient in the morning to home.  The daughter was upset about the patient transferring by ambulance but understands that the patient is unable to ride in the car home. Chrislyn is calling the patient's daughter on the phone now to discuss plans and care for the patient in the morning.   Final next level of care: Home w Hospice Care Barriers to Discharge: Continued Medical Work up (Pending set up with Home Hospice through [facility])   Patient Goals and CMS Choice Patient states their goals for this hospitalization and ongoing recovery are:: Family requesting Hospice care support through [place] to return to [facility] at [employer] [employer] [facility].gov Compare Post  Acute Care list provided to:: Patient Represenative (must comment) Choice offered to / list presented to : [facility] POA / Guardian (daughter, [name])  Discharge Placement                       Discharge Plan and Services   Discharge Planning Services: CM Consult Post Acute Care Choice: Hospice                               Social Determinants of Health (SDOH) Interventions     Readmission Risk Interventions Readmission Risk Prevention Plan 02/29/2020 01/03/2020  Post Dischage Appt - Complete  Medication Screening - Complete  Transportation Screening Complete Complete  PCP or Specialist Appt within 5-7 Days Complete -  Home Care Screening Complete -  Medication Review (RN CM) Complete -  Some recent data might be hidden

## 2020-03-01 NOTE — Plan of Care (Signed)

## 2020-03-02 NOTE — Progress Notes (Signed)
Report given To Threasa Beards, staff nurse of [facility]. All questions and concerns were answered

## 2020-03-02 NOTE — TOC Transition Note (Signed)
Transition of Care [facility]) - CM/SW Discharge Note   Patient Details  Name: [name] MRN: [id number] Date of Birth: [birth year]  Transition of Care [facility]) CM/SW Contact:  Curlene Labrum, RN Phone Number: 03/02/2020, 8:38 AM   Clinical Narrative:    Case management spoke with [facility], CM at [facility] and notified of patient's transfer back to the facility this morning via PTAR.  Clinicals included in the PTAR packet and primary RN, Angelito to call report to [employer].  The [facility] hospice nurse is to see the patient at the assisted living facility at 1100today.  I spoke with the daughter, [name] and she is aware of the patient's transfer this morning.   Final next level of care: Home w Hospice Care Barriers to Discharge: Continued Medical Work up (Pending set up with Home Hospice through [facility])   Patient Goals and CMS Choice Patient states their goals for this hospitalization and ongoing recovery are:: Family requesting Hospice care support through [place] to return to [facility] at [employer].gov Compare Post Acute Care list provided to:: Patient Represenative (must comment) Choice offered to / list presented to : [facility] POA / Guardian (daughter, [name])  Discharge Placement                       Discharge Plan and Services   Discharge Planning Services: CM Consult Post Acute Care Choice: Hospice                               Social Determinants of Health (SDOH) Interventions     Readmission Risk Interventions Readmission Risk Prevention Plan 02/29/2020 01/03/2020  Post Dischage Appt - Complete  Medication Screening - Complete  Transportation Screening Complete Complete  PCP or Specialist Appt within 5-7 Days Complete -  Home Care Screening Complete -  Medication Review (RN CM) Complete -  Some recent data might be hidden

## 2020-03-02 NOTE — Telephone Encounter (Signed)
Left detailed message on machine for [facility].

## 2020-03-02 NOTE — Plan of Care (Signed)

## 2020-03-02 NOTE — Care Management Important Message (Signed)
Important Message  Patient Details  Name: ADELHEID HOGGARD MRN: [id number] Date of Birth: 07/10/1923   Medicare Important Message Given:  Yes     Reya Aurich [address] 03/02/2020, 1:17 PM

## 2020-03-02 NOTE — Progress Notes (Signed)
Discharge summary packet/documents given to [facility] staff. Pt remains alert/oriented confuse at times in no apparent distress. No complaints.

## 2020-03-02 NOTE — Plan of Care (Signed)

## 2020-03-02 NOTE — Telephone Encounter (Signed)
I have never seen her nor do I know what facility she is in. I cannot be her attending at this time.

## 2020-03-06 ENCOUNTER — Ambulatory Visit: Payer: Medicare HMO | Admitting: Psychology

## 2020-03-13 ENCOUNTER — Ambulatory Visit: Payer: Medicare HMO | Admitting: Psychology

## 2020-03-14 ENCOUNTER — Ambulatory Visit: Payer: Medicare HMO | Admitting: Psychology

## 2020-03-20 ENCOUNTER — Ambulatory Visit: Payer: Medicare HMO | Admitting: Psychology

## 2020-03-21 DEATH — deceased

## 2020-03-27 ENCOUNTER — Ambulatory Visit: Payer: Medicare HMO | Admitting: Psychology

## 2020-03-28 ENCOUNTER — Ambulatory Visit: Payer: Medicare HMO | Admitting: Psychology

## 2020-04-03 ENCOUNTER — Ambulatory Visit: Payer: Medicare HMO | Admitting: Psychology

## 2020-04-10 ENCOUNTER — Ambulatory Visit: Payer: Medicare HMO | Admitting: Psychology
